# Patient Record
Sex: Female | Born: 1970 | Race: Asian | Hispanic: No | Marital: Single | Smoking: Never smoker
Health system: Southern US, Community
[De-identification: ages and names within clinical notes are randomized; demographics above are authoritative.]

## PROBLEM LIST (undated history)

## (undated) DIAGNOSIS — F419 Anxiety disorder, unspecified: Secondary | ICD-10-CM

## (undated) DIAGNOSIS — D649 Anemia, unspecified: Secondary | ICD-10-CM

## (undated) DIAGNOSIS — R112 Nausea with vomiting, unspecified: Secondary | ICD-10-CM

## (undated) DIAGNOSIS — R519 Headache, unspecified: Secondary | ICD-10-CM

## (undated) DIAGNOSIS — Z9889 Other specified postprocedural states: Secondary | ICD-10-CM

## (undated) DIAGNOSIS — Z8669 Personal history of other diseases of the nervous system and sense organs: Secondary | ICD-10-CM

## (undated) DIAGNOSIS — R51 Headache: Secondary | ICD-10-CM

## (undated) DIAGNOSIS — I10 Essential (primary) hypertension: Secondary | ICD-10-CM

## (undated) DIAGNOSIS — Z8742 Personal history of other diseases of the female genital tract: Secondary | ICD-10-CM

## (undated) DIAGNOSIS — Z90710 Acquired absence of both cervix and uterus: Secondary | ICD-10-CM

## (undated) DIAGNOSIS — K219 Gastro-esophageal reflux disease without esophagitis: Secondary | ICD-10-CM

## (undated) DIAGNOSIS — F32A Depression, unspecified: Secondary | ICD-10-CM

## (undated) DIAGNOSIS — K635 Polyp of colon: Secondary | ICD-10-CM

## (undated) DIAGNOSIS — F329 Major depressive disorder, single episode, unspecified: Secondary | ICD-10-CM

## (undated) HISTORY — PX: KNEE ARTHROPLASTY: SHX992

## (undated) HISTORY — DX: Polyp of colon: K63.5

## (undated) HISTORY — DX: Acquired absence of both cervix and uterus: Z90.710

## (undated) HISTORY — DX: Anxiety disorder, unspecified: F41.9

## (undated) HISTORY — DX: Personal history of other diseases of the nervous system and sense organs: Z86.69

## (undated) HISTORY — DX: Gastro-esophageal reflux disease without esophagitis: K21.9

## (undated) HISTORY — PX: UPPER GI ENDOSCOPY: SHX6162

## (undated) HISTORY — PX: KNEE ARTHROSCOPY: SUR90

## (undated) HISTORY — PX: ABDOMINAL HYSTERECTOMY: SHX81

## (undated) HISTORY — DX: Essential (primary) hypertension: I10

## (undated) HISTORY — DX: Anemia, unspecified: D64.9

## (undated) HISTORY — PX: COLONOSCOPY: SHX174

## (undated) HISTORY — DX: Personal history of other diseases of the female genital tract: Z87.42

---

## 1993-04-18 HISTORY — PX: WISDOM TOOTH EXTRACTION: SHX21

## 2008-07-05 ENCOUNTER — Emergency Department: Payer: Self-pay | Admitting: Unknown Physician Specialty

## 2009-04-24 ENCOUNTER — Emergency Department (HOSPITAL_COMMUNITY): Admission: EM | Admit: 2009-04-24 | Discharge: 2009-04-24 | Payer: Self-pay | Admitting: Family Medicine

## 2009-06-05 ENCOUNTER — Emergency Department: Payer: Self-pay | Admitting: Unknown Physician Specialty

## 2009-08-16 HISTORY — PX: LAPAROSCOPIC SUPRACERVICAL HYSTERECTOMY: SUR797

## 2009-08-16 HISTORY — PX: LAPAROSCOPIC VAGINAL HYSTERECTOMY: SUR798

## 2009-09-14 ENCOUNTER — Ambulatory Visit: Payer: Self-pay | Admitting: Obstetrics and Gynecology

## 2009-09-15 ENCOUNTER — Ambulatory Visit: Payer: Self-pay | Admitting: Obstetrics and Gynecology

## 2010-02-15 ENCOUNTER — Emergency Department (HOSPITAL_COMMUNITY): Admission: EM | Admit: 2010-02-15 | Discharge: 2010-02-15 | Payer: Self-pay | Admitting: Emergency Medicine

## 2010-06-30 LAB — URINALYSIS, ROUTINE W REFLEX MICROSCOPIC
Glucose, UA: NEGATIVE mg/dL
Ketones, ur: NEGATIVE mg/dL
Nitrite: NEGATIVE
Protein, ur: NEGATIVE mg/dL
Urobilinogen, UA: 0.2 mg/dL (ref 0.0–1.0)

## 2010-06-30 LAB — DIFFERENTIAL
Basophils Absolute: 0 10*3/uL (ref 0.0–0.1)
Basophils Relative: 1 % (ref 0–1)
Eosinophils Absolute: 0.1 10*3/uL (ref 0.0–0.7)
Monocytes Absolute: 0.3 10*3/uL (ref 0.1–1.0)
Neutro Abs: 3.2 10*3/uL (ref 1.7–7.7)

## 2010-06-30 LAB — BASIC METABOLIC PANEL
BUN: 8 mg/dL (ref 6–23)
CO2: 29 mEq/L (ref 19–32)
Calcium: 9.3 mg/dL (ref 8.4–10.5)
GFR calc non Af Amer: >60 mL/min (ref >60)
Glucose, Bld: 88 mg/dL (ref 70–99)
Potassium: 3.8 mEq/L (ref 3.5–5.1)

## 2010-06-30 LAB — CBC
HCT: 38.8 % (ref 36.0–46.0)
MCH: 30.7 pg (ref 26.0–34.0)
MCHC: 34.2 g/dL (ref 30.0–36.0)
RDW: 13.9 % (ref 11.5–15.5)

## 2010-06-30 LAB — HEPATIC FUNCTION PANEL
AST: 18 U/L (ref 0–37)
Bilirubin, Direct: 0.1 mg/dL (ref 0.0–0.3)
Indirect Bilirubin: 0.5 mg/dL (ref 0.3–0.9)

## 2010-06-30 LAB — GC/CHLAMYDIA PROBE AMP, GENITAL: GC Probe Amp, Genital: NEGATIVE

## 2010-06-30 LAB — PREGNANCY, URINE: Preg Test, Ur: NEGATIVE

## 2010-08-17 HISTORY — PX: OTHER SURGICAL HISTORY: SHX169

## 2010-09-09 ENCOUNTER — Ambulatory Visit: Payer: Self-pay | Admitting: Unknown Physician Specialty

## 2011-09-08 ENCOUNTER — Encounter: Payer: Self-pay | Admitting: Internal Medicine

## 2011-09-08 ENCOUNTER — Ambulatory Visit (INDEPENDENT_AMBULATORY_CARE_PROVIDER_SITE_OTHER): Payer: 59 | Admitting: Internal Medicine

## 2011-09-08 VITALS — BP 110/72 | HR 75 | Temp 98.1°F | Ht 64.4 in | Wt 189.0 lb

## 2011-09-08 DIAGNOSIS — N939 Abnormal uterine and vaginal bleeding, unspecified: Secondary | ICD-10-CM

## 2011-09-08 DIAGNOSIS — F419 Anxiety disorder, unspecified: Secondary | ICD-10-CM | POA: Insufficient documentation

## 2011-09-08 DIAGNOSIS — K219 Gastro-esophageal reflux disease without esophagitis: Secondary | ICD-10-CM

## 2011-09-08 DIAGNOSIS — D649 Anemia, unspecified: Secondary | ICD-10-CM | POA: Insufficient documentation

## 2011-09-08 DIAGNOSIS — Z90710 Acquired absence of both cervix and uterus: Secondary | ICD-10-CM | POA: Insufficient documentation

## 2011-09-08 DIAGNOSIS — G43909 Migraine, unspecified, not intractable, without status migrainosus: Secondary | ICD-10-CM

## 2011-09-08 DIAGNOSIS — N898 Other specified noninflammatory disorders of vagina: Secondary | ICD-10-CM

## 2011-09-08 DIAGNOSIS — F411 Generalized anxiety disorder: Secondary | ICD-10-CM

## 2011-09-08 NOTE — Progress Notes (Signed)
Subjective:    [name], female    DOB: 04/09/1971, 41 y.o.   MRN: [id number]  HPI New pt. Here to establish primary care.   PMH of GERD controlled with omeprazole, mild anxiety, anemia,  And migraine headache.  She had a supracervical hysterectomy in 08/2009 by Dr. Nedra Hai at [facility].    Maudene is concerned as she still is bleeding each month when her menses would be due.  "ONce  A month I bleed like a period. "  She also has pelvic pain around her ovulation time.  She reports multiple ultrasounds at [facility] but she does not desire to go back there and would like a new GYN MD  She also has chronic vaginal itching despite multiple vaginal and oral meds it is not helping.    Allergies  Allergen Reactions  . Hydrocodone Anxiety and Palpitations   Past Medical History  Diagnosis Date  . Anemia   . Anxiety   . GERD (gastroesophageal reflux disease)   . History of hysterectomy     still has both ovaries  . History of migraine headaches    Past Surgical History  Procedure Date  . Knee athroscopy 5/12    LT  . Laparoscopic vaginal hysterectomy 5/11    Supra cervical  . Wisdom tooth extraction 1995   History   Social History  . Marital Status: Divorced    Spouse Name: N/A    Number of Children: N/A  . Years of Education: N/A   Occupational History  . Not on file.   Social History Main Topics  . Smoking status: Never Smoker   . Smokeless tobacco: Never Used  . Alcohol Use: Yes     occassional  . Drug Use: No  . Sexually Active: No   Other Topics Concern  . Not on file   Social History Narrative  . No narrative on file   Family History  Problem Relation Age of Onset  . Diabetes Father   . Stroke Father   . Heart attack Father   . Hyperlipidemia Mother   . Hypertension Mother    Patient Active Problem List  Diagnoses  . Anemia  . GERD (gastroesophageal reflux disease)  . Anxiety  . Migraine headache  . H/O: hysterectomy   Current  Outpatient Prescriptions on File Prior to Visit  Medication Sig Dispense Refill  . [facility] OME[facility] PO            Review of Systems See HPI    Objective:   Physical Exam Physical Exam  Nursing note and vitals reviewed.  Constitutional: She is oriented to person, place, and time. She appears well-developed and well-nourished.  HENT:  Head: Normocephalic and atraumatic.  Cardiovascular: Normal rate and regular rhythm. Exam reveals no gallop and no friction rub.  No murmur heard.  Pulmonary/Chest: Breath sounds normal. She has no wheezes. She has no rales.  Neurological: She is alert and oriented to person, place, and time.  Skin: Skin is warm and dry.  Psychiatric: She has a normal mood and affect. Her behavior is normal.  I did not do pelvic today       Assessment & Plan:  1)  MOnthly vaginal bleeding post supracervical hysterectomy>  Unclear of etitology ?? Endometriosis Wil refer to GYN and need old records from Dr. Nedra Hai as she reports multiple ultrasounds done 2)  Chronic vagintits  Suspect noninfectious  Will need old records and will refer to GYN 3)  Anemia  Check today 4)  History of migraine headache 5)  GERD  Controlled with omperazole

## 2011-09-08 NOTE — Patient Instructions (Signed)
Will refer to gyn   Labs will be mailed to you

## 2011-09-14 ENCOUNTER — Telehealth: Payer: Self-pay | Admitting: *Deleted

## 2011-09-14 DIAGNOSIS — G43909 Migraine, unspecified, not intractable, without status migrainosus: Secondary | ICD-10-CM

## 2011-09-14 MED ORDER — SUMATRIPTAN SUCCINATE 100 MG PO TABS: ORAL_TABLET | ORAL | Status: DC

## 2011-09-14 NOTE — Telephone Encounter (Signed)
Pt called stating that she was seen in the office last week and was to get a RX for Imitrex called into [facility].  Spoke with Dr Constance Goltz via phone and she ordered Imitrex 100mg  #10 Take one onset of headache and may repeat in 2 hours.  NR given

## 2011-09-27 ENCOUNTER — Telehealth: Payer: Self-pay | Admitting: *Deleted

## 2011-09-27 NOTE — Telephone Encounter (Signed)
Copy pf labs mailed to pt's home address.

## 2011-10-03 ENCOUNTER — Encounter: Payer: Self-pay | Admitting: Internal Medicine

## 2011-10-05 ENCOUNTER — Ambulatory Visit: Payer: Self-pay | Admitting: Internal Medicine

## 2011-10-23 ENCOUNTER — Encounter: Payer: Self-pay | Admitting: Internal Medicine

## 2011-10-23 DIAGNOSIS — Z90711 Acquired absence of uterus with remaining cervical stump: Secondary | ICD-10-CM | POA: Insufficient documentation

## 2011-11-22 ENCOUNTER — Telehealth: Payer: Self-pay | Admitting: *Deleted

## 2011-11-22 NOTE — Telephone Encounter (Signed)
Patricia Bauer HR specialist @ [employer] sent over a form to be filled out stating any disabilities the pt has for need a special keyboard.  The only thing in her chart is that she does suffer from migraines.  There is no physcial reason noted why she should need this keyboard.

## 2011-11-30 ENCOUNTER — Ambulatory Visit: Admit: 2011-11-30 | Payer: Self-pay | Admitting: Orthopedic Surgery

## 2011-11-30 SURGERY — ARTHROSCOPY, KNEE
Anesthesia: Choice | Laterality: Left

## 2011-12-06 ENCOUNTER — Encounter: Payer: 59 | Admitting: Internal Medicine

## 2011-12-13 ENCOUNTER — Telehealth: Payer: Self-pay | Admitting: *Deleted

## 2011-12-28 ENCOUNTER — Encounter: Payer: Self-pay | Admitting: Internal Medicine

## 2011-12-28 ENCOUNTER — Ambulatory Visit (INDEPENDENT_AMBULATORY_CARE_PROVIDER_SITE_OTHER): Payer: 59 | Admitting: Internal Medicine

## 2011-12-28 ENCOUNTER — Telehealth: Payer: Self-pay | Admitting: *Deleted

## 2011-12-28 VITALS — BP 126/80 | HR 79 | Temp 97.6°F | Resp 16 | Wt 188.0 lb

## 2011-12-28 DIAGNOSIS — R110 Nausea: Secondary | ICD-10-CM

## 2011-12-28 DIAGNOSIS — R42 Dizziness and giddiness: Secondary | ICD-10-CM

## 2011-12-28 MED ORDER — DIAZEPAM 5 MG PO TABS: ORAL_TABLET | ORAL | Status: DC

## 2011-12-28 MED ORDER — PROMETHAZINE HCL 12.5 MG PO TABS: 12.5000 mg | ORAL_TABLET | Freq: Three times a day (TID) | ORAL | Status: DC | PRN

## 2011-12-28 NOTE — Patient Instructions (Addendum)
Take medicine as prescribed   See me Monday

## 2011-12-29 ENCOUNTER — Encounter: Payer: Self-pay | Admitting: Internal Medicine

## 2011-12-29 ENCOUNTER — Ambulatory Visit: Payer: 59 | Admitting: Internal Medicine

## 2011-12-29 ENCOUNTER — Telehealth: Payer: Self-pay | Admitting: Internal Medicine

## 2011-12-29 DIAGNOSIS — R42 Dizziness and giddiness: Secondary | ICD-10-CM | POA: Insufficient documentation

## 2011-12-29 NOTE — Progress Notes (Signed)
Subjective:    [name], female    DOB: [birth year], 41 y.o.   MRN: [id number]  HPI [name] is here for acute visit.  She describes 2 days ago on Monday she had acute episode of "room spinning" associated with nausea but no vomiting.  She denies recent URI or allergy symptoms.  She felt some improvement over the next 2 days but still feels very dizzy when turning head or changing head position.  No facial or body numbness or weakness, no dysarthria,  No muscle weakness, no visual change.  No prior episode of vertigo.  She is recovering from recent knee meniscus repair from Dr. Despina Hick.    Allergies  Allergen Reactions  . Hydrocodone Anxiety and Palpitations   Past Medical History  Diagnosis Date  . Anemia   . Anxiety   . GERD (gastroesophageal reflux disease)   . History of hysterectomy     still has both ovaries  . History of migraine headaches    Past Surgical History  Procedure Date  . Knee athroscopy 5/12    LT  . Laparoscopic vaginal hysterectomy 5/11    Supra cervical  . Wisdom tooth extraction 1995  . Knee arthroscopy    History   Social History  . Marital Status: Divorced    Spouse Name: N/A    Number of Children: N/A  . Years of Education: N/A   Occupational History  . Not on file.   Social History Main Topics  . Smoking status: Never Smoker   . Smokeless tobacco: Never Used  . Alcohol Use: Yes     occassional  . Drug Use: No  . Sexually Active: No   Other Topics Concern  . Not on file   Social History Narrative  . No narrative on file   Family History  Problem Relation Age of Onset  . Diabetes Father   . Stroke Father   . Heart attack Father   . Hyperlipidemia Mother   . Hypertension Mother    Patient Active Problem List  Diagnosis  . Anemia  . GERD (gastroesophageal reflux disease)  . Anxiety  . Migraine headache  . H/O: hysterectomy  . Vaginal bleeding problems  . History of hysterectomy, supracervical  . Vertigo    Current Outpatient Prescriptions on File Prior to Visit  Medication Sig Dispense Refill  . [facility] PO       . SUMAtriptan (IMITREX) 100 MG tablet Take one PO @ onset of headache, may repeat in 2 hrs if needed  10 tablet  0  . AMBULATORY NON FORMULARY MEDICATION L-Carnitine  2 tabs up to three times daily      . b complex vitamins capsule Take 1 capsule by mouth daily.      Marland Kitchen glucosamine-chondroitin 500-400 MG tablet Take 1 tablet by mouth daily.      . Multiple Minerals-Vitamins (CALCIUM CITRATE PLUS PO) Take 3 tablets by mouth daily.      . promethazine (PHENERGAN) 12.5 MG tablet Take 1 tablet (12.5 mg total) by mouth every 8 (eight) hours as needed for nausea.  6 tablet  0       Review of Systems    see HPI Objective:   Physical Exam Physical Exam  Nursing note and vitals reviewed.  Constitutional: She is oriented to person, place, and time. She appears well-developed and well-nourished.  HENT:  Head: Normocephalic and atraumatic.  Cardiovascular: Normal rate and regular rhythm. Exam reveals no gallop and no friction rub.  No murmur heard.  Pulmonary/Chest: Breath sounds normal. She has no wheezes. She has no rales.  Neurological: She is alert and oriented to person, place, and time.  Skin: Skin is warm and dry.  Psychiatric: She has a normal mood and affect. Her behavior is normal.  Neurologic:  CNII-XII  nystagmus to extreme lateral gaze  Motor 5/5 all muscle groups tested in UE and LE  Reflexes 2+ symmetric  Cerebellar  Intact FTN  Sensory  Intact to pinprick              Assessment & Plan:  Dizziness  clinicallly consistiant with BPV.  Will treat with Valium low dose and Phenergan prn.  Advised not to drive for now and to stay off work tomorrow.  Given educational handout regarding vertigo and advised if any worsening or speech, muscle weaness or numbness to go to ER for CT scan.  She voices understanding. Will recheck in 5 days or sooner prn.  Also  given instructions on Epley maneuver.

## 2011-12-29 NOTE — Telephone Encounter (Signed)
Patricia Bauer  Call pt after lunch time today and check on her to see how she is feeling.  Advise her if any worsening symptoms, numbness, muscle weakness over week-end to go to urgent care or ER.    She has follow up appt with me on Monday.    Route back to me with pt response

## 2011-12-29 NOTE — Telephone Encounter (Signed)
Called to follow up with pt. Pt states that medication prescribed has not worked. Advised pt to seek attention over weekend if sx worsened. Pt was at work this PM

## 2012-01-02 ENCOUNTER — Ambulatory Visit: Payer: 59 | Admitting: Internal Medicine

## 2012-01-02 NOTE — Telephone Encounter (Signed)
Pt rescheduled as a work in

## 2012-01-04 NOTE — Telephone Encounter (Signed)
Returned call regarding results

## 2012-03-01 ENCOUNTER — Telehealth: Payer: Self-pay | Admitting: *Deleted

## 2012-03-01 NOTE — Telephone Encounter (Signed)
FMLA papers faxed to [employer] [facility] [employer]

## 2012-03-06 ENCOUNTER — Telehealth: Payer: Self-pay | Admitting: Internal Medicine

## 2012-03-06 NOTE — Telephone Encounter (Signed)
Pt states she needs you to call her.. The papers that were faxed last week for her FMLA; there are some things that need to be clarified... Please call her [phone].Marland Kitchen

## 2012-03-07 ENCOUNTER — Other Ambulatory Visit: Payer: Self-pay | Admitting: Internal Medicine

## 2012-03-08 ENCOUNTER — Other Ambulatory Visit: Payer: Self-pay | Admitting: Internal Medicine

## 2012-03-08 ENCOUNTER — Other Ambulatory Visit: Payer: Self-pay | Admitting: *Deleted

## 2012-03-08 ENCOUNTER — Ambulatory Visit (INDEPENDENT_AMBULATORY_CARE_PROVIDER_SITE_OTHER): Payer: 59 | Admitting: Internal Medicine

## 2012-03-08 ENCOUNTER — Encounter: Payer: Self-pay | Admitting: Internal Medicine

## 2012-03-08 VITALS — BP 122/80 | HR 72 | Temp 98.7°F | Resp 16 | Ht 65.0 in | Wt 186.1 lb

## 2012-03-08 DIAGNOSIS — F411 Generalized anxiety disorder: Secondary | ICD-10-CM

## 2012-03-08 DIAGNOSIS — Z87898 Personal history of other specified conditions: Secondary | ICD-10-CM | POA: Insufficient documentation

## 2012-03-08 DIAGNOSIS — N6310 Unspecified lump in the right breast, unspecified quadrant: Secondary | ICD-10-CM | POA: Insufficient documentation

## 2012-03-08 DIAGNOSIS — K219 Gastro-esophageal reflux disease without esophagitis: Secondary | ICD-10-CM

## 2012-03-08 DIAGNOSIS — F419 Anxiety disorder, unspecified: Secondary | ICD-10-CM

## 2012-03-08 DIAGNOSIS — N63 Unspecified lump in unspecified breast: Secondary | ICD-10-CM

## 2012-03-08 DIAGNOSIS — Z0000 Encounter for general adult medical examination without abnormal findings: Secondary | ICD-10-CM

## 2012-03-08 DIAGNOSIS — D649 Anemia, unspecified: Secondary | ICD-10-CM

## 2012-03-08 DIAGNOSIS — G43909 Migraine, unspecified, not intractable, without status migrainosus: Secondary | ICD-10-CM

## 2012-03-08 DIAGNOSIS — Z1231 Encounter for screening mammogram for malignant neoplasm of breast: Secondary | ICD-10-CM

## 2012-03-08 LAB — POCT URINALYSIS DIPSTICK
Glucose, UA: NEGATIVE
Ketones, UA: NEGATIVE
Leukocytes, UA: NEGATIVE
Spec Grav, UA: <=1.005

## 2012-03-08 NOTE — Patient Instructions (Addendum)
To have diagnostic mammogram

## 2012-03-08 NOTE — Telephone Encounter (Signed)
Needs refill

## 2012-03-08 NOTE — Telephone Encounter (Signed)
Patricia Bauer  What does she need refill of??

## 2012-03-08 NOTE — Progress Notes (Signed)
Subjective:    [name], female    DOB: 1970/06/20, 41 y.o.   MRN: [id number]  HPI Patricia Bauer is here for comprehensive eval  She did see a GYN in [place] for vaginal spotting.  Pap was done at that time.  I do not  have results.   She describes episodes where she lies in bed at night watching TV, and "next thing I know I hear the alarm ringing"  She does not recall getting up to turn off TV or lights but it will be dark when I wake up.  No palpitations, no dizziness.  Episodes do not occur in daytime. No incontinence.   Has lower crampy abd pain with gas when she eats dairy.  She has been told she is borderline gluten intolerant in the past.  She states she felt a whole lot better with a gluten free diet but she went back to eating gluted because "it was too expensive and I craved bread"   Migraines getting worse  3-5 episodes a week.  Had been on beta blocker in the past.   She does give a history  Of aspiration of Breast cyst in the past.     She does give history of having a colon polyp.  Was scoped somewhere in [place] but does not remember who did it.  She had upper and lower endoscopy work up for anemia and was told she had a colon polyp  She is gaining weight and cannot exercise .   Allergies  Allergen Reactions  . Hydrocodone Anxiety and Palpitations   Past Medical History  Diagnosis Date  . Anemia   . Anxiety   . GERD (gastroesophageal reflux disease)   . History of hysterectomy     still has both ovaries  . History of migraine headaches    Past Surgical History  Procedure Date  . Knee athroscopy 5/12    LT  . Laparoscopic vaginal hysterectomy 5/11    Supra cervical  . Wisdom tooth extraction 1995  . Knee arthroscopy    History   Social History  . Marital Status: Divorced    Spouse Name: N/A    Number of Children: N/A  . Years of Education: N/A   Occupational History  . Not on file.   Social History Main Topics  . Smoking status: Never  Smoker   . Smokeless tobacco: Never Used  . Alcohol Use: Yes     Comment: occassional  . Drug Use: No  . Sexually Active: No   Other Topics Concern  . Not on file   Social History Narrative  . No narrative on file   Family History  Problem Relation Age of Onset  . Diabetes Father   . Stroke Father   . Heart attack Father   . Hyperlipidemia Mother   . Hypertension Mother    Patient Active Problem List  Diagnosis  . Anemia  . GERD (gastroesophageal reflux disease)  . Anxiety  . Migraine headache  . H/O: hysterectomy  . Vaginal bleeding problems  . History of hysterectomy, supracervical  . Vertigo  . History of headache   Current Outpatient Prescriptions on File Prior to Visit  Medication Sig Dispense Refill  . [facility] OMEPR[facility]LE PO       . SUMAtriptan (IMITREX) 100 MG tablet Take one PO @ onset of headache, may repeat in 2 hrs if needed  10 tablet  0  . AMBULATORY NON FORMULARY MEDICATION L-Carnitine  2 tabs up to  three times daily      . b complex vitamins capsule Take 1 capsule by mouth daily.      . diazepam (VALIUM) 5 MG tablet Take 1/2 tablet every 12 hours as needed for vertigo. May repeat in 1 hour if needed  12 tablet  0  . glucosamine-chondroitin 500-400 MG tablet Take 1 tablet by mouth daily.      . Multiple Minerals-Vitamins (CALCIUM CITRATE PLUS PO) Take 3 tablets by mouth daily.      . promethazine (PHENERGAN) 12.5 MG tablet Take 1 tablet (12.5 mg total) by mouth every 8 (eight) hours as needed for nausea.  6 tablet  0      Review of Systems     Objective:   Physical Exam Physical Exam  Nursing note and vitals reviewed.  Constitutional: She is oriented to person, place, and time. She appears well-developed and well-nourished.  HENT:  Head: Normocephalic and atraumatic.  Right Ear: Tympanic membrane and ear canal normal. No drainage. Tympanic membrane is not injected and not erythematous.  Left Ear: Tympanic membrane and ear canal normal. No drainage.  Tympanic membrane is not injected and not erythematous.  Nose: Nose normal. Right sinus exhibits no maxillary sinus tenderness and no frontal sinus tenderness. Left sinus exhibits no maxillary sinus tenderness and no frontal sinus tenderness.  Mouth/Throat: Oropharynx is clear and moist. No oral lesions. No oropharyngeal exudate.  Eyes: Conjunctivae and EOM are normal. Pupils are equal, round, and reactive to light.  Neck: Normal range of motion. Neck supple. No JVD present. Carotid bruit is not present. No mass and no thyromegaly present.  Cardiovascular: Normal rate, regular rhythm, S1 normal, S2 normal and intact distal pulses. Exam reveals no gallop and no friction rub.  No murmur heard.  Pulses:  Carotid pulses are 2+ on the right side, and 2+ on the left side.  Dorsalis pedis pulses are 2+ on the right side, and 2+ on the left side.  No carotid bruit. No LE edema  Pulmonary/Chest: Breath sounds normal. She has no wheezes. She has no rales. She exhibits no tenderness.  Breast  She has two masses near areola R  breast.  No nipple discharge no axillary adenopathy.  R breast   No nipple discharge no discrete masses no axillary adenopathy Abdominal: Soft. Bowel sounds are normal. She exhibits no distension and no mass. There is no hepatosplenomegaly. There is no tenderness. There is no CVA tenderness.  Musculoskeletal: Normal range of motion.  No active synovitis to joints.  Lymphadenopathy:  She has no cervical adenopathy.  She has no axillary adenopathy.  Right: No inguinal and no supraclavicular adenopathy present.  Left: No inguinal and no supraclavicular adenopathy present.  Neurological: She is alert and oriented to person, place, and time. She has normal strength and normal reflexes. She displays no tremor. No cranial nerve deficit or sensory deficit. Coordination and gait normal.  Skin: Skin is warm and dry. No rash noted. No cyanosis. Nails show no clubbing.  Psychiatric: She has a  normal mood and affect. Her speech is normal and behavior is normal. Cognition and memory are normal.           Assessment & Plan:  Health Maintenance  Flu vaccine last month.  Declines tdap today    R breast mass x 2  Will get both bilateral screening and diagnostic mm of R breast.  Pt does give history of aspiration of breast cysts in the past  Anemia  Improved  Migraine  More frequent now.  Will refer to headache center  History of vagina spotting  Was evaluated at  A GYN office in [place]  Mental status change:  Etiology unclear  Not sure if pt is actually losing consciousness  EKG ok today.  Will watch for now  Abd cramping will empirically stop all dairy for now.  She has been better on gluten freee diet in past.  See me in 6-8 weeks  After dairy free trial She has an appt on 05/01/2012

## 2012-03-09 ENCOUNTER — Other Ambulatory Visit: Payer: Self-pay | Admitting: Internal Medicine

## 2012-03-09 ENCOUNTER — Other Ambulatory Visit: Payer: Self-pay | Admitting: *Deleted

## 2012-03-09 DIAGNOSIS — N6310 Unspecified lump in the right breast, unspecified quadrant: Secondary | ICD-10-CM

## 2012-03-09 DIAGNOSIS — G43909 Migraine, unspecified, not intractable, without status migrainosus: Secondary | ICD-10-CM

## 2012-03-09 NOTE — Telephone Encounter (Signed)
imitrex

## 2012-03-10 MED ORDER — SUMATRIPTAN SUCCINATE 100 MG PO TABS: ORAL_TABLET | ORAL | Status: DC

## 2012-03-20 ENCOUNTER — Ambulatory Visit
Admission: RE | Admit: 2012-03-20 | Discharge: 2012-03-20 | Disposition: A | Discharge status: Home / Self Care | Payer: 59 | Source: Ambulatory Visit | Attending: Internal Medicine | Admitting: Internal Medicine

## 2012-03-20 DIAGNOSIS — N6310 Unspecified lump in the right breast, unspecified quadrant: Secondary | ICD-10-CM

## 2012-03-21 ENCOUNTER — Ambulatory Visit (HOSPITAL_BASED_OUTPATIENT_CLINIC_OR_DEPARTMENT_OTHER): Payer: 59

## 2012-03-26 ENCOUNTER — Telehealth: Payer: Self-pay | Admitting: Internal Medicine

## 2012-03-26 NOTE — Telephone Encounter (Signed)
Left message on pt mobile to return call regarding breast ultrasound results

## 2012-03-27 ENCOUNTER — Telehealth: Payer: Self-pay | Admitting: Internal Medicine

## 2012-03-27 DIAGNOSIS — N6310 Unspecified lump in the right breast, unspecified quadrant: Secondary | ICD-10-CM

## 2012-03-27 NOTE — Telephone Encounter (Signed)
I spoke with pt and informed of mammogram results.  I counseled pt that my recommendation is to have the breast cyst aspirated and fluid sent for pathology. I recommended referral to breast surgeon but pt declines repeatedly .

## 2012-03-28 ENCOUNTER — Ambulatory Visit: Payer: Self-pay | Admitting: Family Medicine

## 2012-05-01 ENCOUNTER — Ambulatory Visit: Payer: 59 | Admitting: Internal Medicine

## 2012-06-13 ENCOUNTER — Emergency Department: Payer: Self-pay | Admitting: Emergency Medicine

## 2012-07-05 ENCOUNTER — Other Ambulatory Visit: Payer: Self-pay | Admitting: Internal Medicine

## 2012-07-05 NOTE — Telephone Encounter (Signed)
Refill request will call in pending approval 

## 2012-07-05 NOTE — Telephone Encounter (Signed)
Valium called in to [facility]

## 2012-08-07 ENCOUNTER — Telehealth: Payer: Self-pay | Admitting: *Deleted

## 2012-08-07 ENCOUNTER — Telehealth: Payer: Self-pay | Admitting: Internal Medicine

## 2012-08-07 DIAGNOSIS — Z86010 Personal history of colonic polyps: Secondary | ICD-10-CM

## 2012-08-07 NOTE — Telephone Encounter (Signed)
Patricia Bauer  Ok to refer to Dr. Kinnie Scales

## 2012-08-07 NOTE — Telephone Encounter (Signed)
Information requested faxed to Dr [facility] office

## 2012-08-07 NOTE — Telephone Encounter (Signed)
Notified pt that I faxed info to Dr Kinnie Scales and that she will need to make her own appt Spoke with Mardella Layman at Dr [facility] office who stated that all she needs is demographics, insurance, and last OV notes

## 2012-08-08 ENCOUNTER — Ambulatory Visit (INDEPENDENT_AMBULATORY_CARE_PROVIDER_SITE_OTHER): Payer: 59 | Admitting: Internal Medicine

## 2012-08-08 ENCOUNTER — Telehealth: Payer: Self-pay | Admitting: *Deleted

## 2012-08-08 ENCOUNTER — Encounter: Payer: Self-pay | Admitting: Internal Medicine

## 2012-08-08 VITALS — BP 134/87 | HR 81 | Temp 97.3°F | Resp 20 | Wt 186.0 lb

## 2012-08-08 DIAGNOSIS — R110 Nausea: Secondary | ICD-10-CM

## 2012-08-08 DIAGNOSIS — F411 Generalized anxiety disorder: Secondary | ICD-10-CM

## 2012-08-08 DIAGNOSIS — F419 Anxiety disorder, unspecified: Secondary | ICD-10-CM

## 2012-08-08 MED ORDER — LORAZEPAM 0.5 MG PO TABS: ORAL_TABLET | ORAL | Status: DC

## 2012-08-08 NOTE — Progress Notes (Signed)
Subjective:    [name], female    DOB: 07/08/1970, 42 y.o.   MRN: [id number]  HPI Xolani is here for acute visit.  She states  "my anxiety is back with a vengeance".  She reports she has been told she has OCD, anxiety, and depression in the past.  OCD started after her first pregnancy when she was constantly vacuuming the floor.  She states she has never seen a psychiatrist for any of her mental issues.  She has been prescribed Zoloft and Wellbutrin in the past.  She could not tolerate either of these meds.  She does not recall ever taking any benzodazepines except when she was given valium for her vertigo  She describes work has been the biggest stressor for her.   She is working "10-12 hour days" She describes that she is having daily anxiety with heart palpitations  "feel like I'm having a heart attack" at times.   She works at [employer] in [employer].  Cannot sleep as she feels anxious  Of note she has been seeing Dr. Neale Burly for her headaches and she is on "maximum dose of Topamax"  But she cannot tell me the dose.  She states she takes 3 pills at night for the past 3-4 weeks.    I have received faxed labs from Dr. Nanine Means office  Dated 06/27/2012  Which reveals normal chemistries,  LDL 128 with total 199 and HGB 12.8 ESR of 6.    Andromeda also describes persistant nausea but no vomiting, diarrhea no fever no abd pain  Allergies  Allergen Reactions  . Hydrocodone Anxiety and Palpitations   Past Medical History  Diagnosis Date  . Anemia   . Anxiety   . GERD (gastroesophageal reflux disease)   . History of hysterectomy     still has both ovaries  . History of migraine headaches    Past Surgical History  Procedure Laterality Date  . Knee athroscopy  5/12    LT  . Laparoscopic vaginal hysterectomy  5/11    Supra cervical  . Wisdom tooth extraction  1995  . Knee arthroscopy     History   Social History  . Marital Status: Divorced    Spouse Name: N/A   Number of Children: N/A  . Years of Education: N/A   Occupational History  . Not on file.   Social History Main Topics  . Smoking status: Never Smoker   . Smokeless tobacco: Never Used  . Alcohol Use: Yes     Comment: occassional  . Drug Use: No  . Sexually Active: No   Other Topics Concern  . Not on file   Social History Narrative  . No narrative on file   Family History  Problem Relation Age of Onset  . Diabetes Father   . Stroke Father   . Heart attack Father   . Hyperlipidemia Mother   . Hypertension Mother    Patient Active Problem List  Diagnosis  . Anemia  . GERD (gastroesophageal reflux disease)  . Anxiety  . Migraine headache  . H/O: hysterectomy  . Vaginal bleeding problems  . History of hysterectomy, supracervical  . Vertigo  . History of headache  . Breast mass, right   Current Outpatient Prescriptions on File Prior to Visit  Medication Sig Dispense Refill  . [facility] O[facility] PO       . glucosamine-chondroitin 500-400 MG tablet Take 1 tablet by mouth daily.      . Multiple Minerals-Vitamins (CALCIUM  CITRATE PLUS PO) Take 3 tablets by mouth daily.      . promethazine (PHENERGAN) 12.5 MG tablet Take 1 tablet (12.5 mg total) by mouth every 8 (eight) hours as needed for nausea.  6 tablet  0   No current facility-administered medications on file prior to visit.      Review of Systems See HPI     Objective:   Physical Exam Physical Exam  Nursing note and vitals reviewed.  Constitutional: She is oriented to person, place, and time. She appears well-developed and well-nourished.  HENT:  Head: Normocephalic and atraumatic.  Cardiovascular: Normal rate and regular rhythm. Exam reveals no gallop and no friction rub.  No murmur heard.  Pulmonary/Chest: Breath sounds normal. She has no wheezes. She has no rales.  Neurological: She is alert and oriented to person, place, and time.  Skin: Skin is warm and dry.  Psychiatric: She has a normal mood and  affect. Her behavior is normal.         Assessment & Plan:  Anxiety:   She certainly has anxiety symptoms with insomnia.  She declines any  blood drawn today as she just had it done at Dr. Onnie Boer office.   Will likely need psychiatric consult for diagnostic evaluation.    I am willing to given short course of ativan and will re-evaluate pt in 2-3 weeks.    Nausea  Etiology  Unclear at this point.    ??? possibly due to high dose Topamax    She is to call office if any fever, abd pain vomiting or diarrhea developes.  She voices understanding

## 2012-08-08 NOTE — Patient Instructions (Addendum)
See me in 2-3 weeks  Call if any vomiting, fever, abd pain or diarrhea develpes

## 2012-08-21 ENCOUNTER — Encounter: Payer: Self-pay | Admitting: *Deleted

## 2012-08-29 NOTE — Telephone Encounter (Signed)
refill 

## 2012-09-05 ENCOUNTER — Encounter: Payer: Self-pay | Admitting: Internal Medicine

## 2012-09-05 ENCOUNTER — Ambulatory Visit (INDEPENDENT_AMBULATORY_CARE_PROVIDER_SITE_OTHER): Payer: 59 | Admitting: Internal Medicine

## 2012-09-05 VITALS — BP 130/88 | HR 71 | Temp 98.1°F | Resp 18 | Wt 187.0 lb

## 2012-09-05 DIAGNOSIS — R102 Pelvic and perineal pain: Secondary | ICD-10-CM

## 2012-09-05 DIAGNOSIS — F411 Generalized anxiety disorder: Secondary | ICD-10-CM

## 2012-09-05 DIAGNOSIS — F419 Anxiety disorder, unspecified: Secondary | ICD-10-CM

## 2012-09-05 DIAGNOSIS — N949 Unspecified condition associated with female genital organs and menstrual cycle: Secondary | ICD-10-CM

## 2012-09-05 DIAGNOSIS — R110 Nausea: Secondary | ICD-10-CM

## 2012-09-05 NOTE — Patient Instructions (Addendum)
Call my office if any pelvic pain recurs

## 2012-09-05 NOTE — Progress Notes (Signed)
Subjective:    [name], female    DOB: 1970-11-22, 42 y.o.   MRN: [id number]  HPI  Patricia Bauer is here for follow up.  She states she is better and has not needed Ativan since her Topamax dose was decreased to 75 mg.   Anxiety and nausea both resolved when reducing dose.     She states she saw her GYN in [place] last week for her pap smear.  I do not have report.  She reports she has a history of ovarian cyst and she had a pain in her lower left side yesterday.  No pain today.  No urinary frequency or urgency   Allergies  Allergen Reactions  . Hydrocodone Anxiety and Palpitations   Past Medical History  Diagnosis Date  . Anemia   . Anxiety   . GERD (gastroesophageal reflux disease)   . History of hysterectomy     still has both ovaries  . History of migraine headaches    Past Surgical History  Procedure Laterality Date  . Knee athroscopy  5/12    LT  . Laparoscopic vaginal hysterectomy  5/11    Supra cervical  . Wisdom tooth extraction  1995  . Knee arthroscopy     History   Social History  . Marital Status: Divorced    Spouse Name: N/A    Number of Children: N/A  . Years of Education: N/A   Occupational History  . Not on file.   Social History Main Topics  . Smoking status: Never Smoker   . Smokeless tobacco: Never Used  . Alcohol Use: Yes     Comment: occassional  . Drug Use: No  . Sexually Active: No   Other Topics Concern  . Not on file   Social History Narrative  . No narrative on file   Family History  Problem Relation Age of Onset  . Diabetes Father   . Stroke Father   . Heart attack Father   . Hyperlipidemia Mother   . Hypertension Mother    Patient Active Problem List   Diagnosis Date Noted  . History of headache 03/08/2012  . Breast mass, right 03/08/2012  . Vertigo 12/29/2011  . History of hysterectomy, supracervical 10/23/2011  . Anemia 09/08/2011  . GERD (gastroesophageal reflux disease) 09/08/2011  . Anxiety  09/08/2011  . Migraine headache 09/08/2011  . H/O: hysterectomy 09/08/2011  . Vaginal bleeding problems 09/08/2011   Current Outpatient Prescriptions on File Prior to Visit  Medication Sig Dispense Refill  . [facility] OMEPRAZOLE PO       . Multiple Minerals-Vitamins (CALCIUM CITRATE PLUS PO) Take 3 tablets by mouth daily.      Mar[facility] Kitchen LORazepam (ATIVAN) 0.5 MG tablet Take one tablet hs prn .  May repeat 1/2 tablet during the day for anxiety  30 tablet  0  . topiramate (TOPAMAX) 100 MG tablet Take 75 mg by mouth daily.        No current facility-administered medications on file prior to visit.      Review of Systems See HPI    Objective:   Physical Exam  Physical Exam  Nursing note and vitals reviewed.  Constitutional: She is oriented to person, place, and time. She appears well-developed and well-nourished.  HENT:  Head: Normocephalic and atraumatic.  Cardiovascular: Normal rate and regular rhythm. Exam reveals no gallop and no friction rub.  No murmur heard.  Pulmonary/Chest: Breath sounds normal. She has no wheezes. She has no rales.  Neurological: She  is alert and oriented to person, place, and time.  Skin: Skin is warm and dry.  Psychiatric: She has a normal mood and affect. Her behavior is normal.             Assessment & Plan:  Anxiety  Improved  On lower dose Topamax  Nausea  Improved   Left sided pelvic pain:  ?? Eitology.  She has an upcoming appt  For her colonoscopy in a few weeks  Advised to call my  office if pain recurs or she may see her GYN to have ultrasound

## 2012-09-12 ENCOUNTER — Telehealth: Payer: Self-pay | Admitting: *Deleted

## 2012-09-12 NOTE — Telephone Encounter (Signed)
Patricia Bauer called today about FMLA paperwork.  She states that the dates on the paperwork were incorrect.  She also states that Dr Neale Burly said he could not fill them out because she had not seen him enough.  She would like a return call on this issue.

## 2012-09-14 NOTE — Telephone Encounter (Signed)
Explained to pt that Dr Constance Goltz was not treating her migraines and therefore would not fill out paperwork explained that if we filled out paperwork it would reflect that she did not need FMLA since we have no record of treating her migraines. suggested that pt make an appt to see Dr Neale Burly to discuss this. Pt states that she has to see him 2-3 more times for migraines before they will fill out paperwork.

## 2012-10-16 ENCOUNTER — Encounter: Payer: Self-pay | Admitting: Internal Medicine

## 2012-10-16 DIAGNOSIS — Z860100 Personal history of colon polyps, unspecified: Secondary | ICD-10-CM | POA: Insufficient documentation

## 2012-10-18 ENCOUNTER — Encounter: Payer: Self-pay | Admitting: *Deleted

## 2013-03-13 ENCOUNTER — Emergency Department: Payer: Self-pay | Admitting: Emergency Medicine

## 2013-03-13 LAB — BASIC METABOLIC PANEL
Anion Gap: 7 (ref 7–16)
Creatinine: 0.8 mg/dL (ref 0.60–1.30)
EGFR (African American): >60
EGFR (Non-African Amer.): >60
Osmolality: 277 (ref 275–301)

## 2013-03-13 LAB — CBC: HCT: 36.3 % (ref 35.0–47.0)

## 2013-06-06 ENCOUNTER — Ambulatory Visit: Payer: Self-pay | Admitting: Family Medicine

## 2013-11-06 ENCOUNTER — Other Ambulatory Visit: Payer: Self-pay | Admitting: Orthopedic Surgery

## 2013-11-06 DIAGNOSIS — M25562 Pain in left knee: Secondary | ICD-10-CM

## 2013-11-25 ENCOUNTER — Other Ambulatory Visit: Payer: 59

## 2013-12-02 ENCOUNTER — Ambulatory Visit
Admission: RE | Admit: 2013-12-02 | Discharge: 2013-12-02 | Disposition: A | Discharge status: Home / Self Care | Payer: Worker's Compensation | Source: Ambulatory Visit | Attending: Orthopedic Surgery | Admitting: Orthopedic Surgery

## 2013-12-02 DIAGNOSIS — M25562 Pain in left knee: Secondary | ICD-10-CM

## 2014-01-14 ENCOUNTER — Other Ambulatory Visit: Payer: Self-pay | Admitting: Orthopedic Surgery

## 2014-01-14 NOTE — Progress Notes (Signed)
Preoperative surgical orders have been place into the Epic hospital system for Patricia Bauer on 01/14/2014, 12:04 PM  by Mickel Crow for surgery on 02/10/2014.  Preop Uni Knee orders including Experal, IV Tylenol, and IV Decadron as long as there are no contraindications to the above medications. [name], PA-C

## 2014-01-31 ENCOUNTER — Encounter (HOSPITAL_COMMUNITY): Payer: Self-pay | Admitting: Pharmacy Technician

## 2014-02-06 NOTE — Patient Instructions (Addendum)
Patricia Bauer  02/06/2014   Your procedure is scheduled on:    Come thru [facility] Entrance   Follow the Signs to [facility] at [place]       am  Call this number if you have problems the morning of surgery: [phone]   Remember:   Do not eat food or drink liquids after midnight.   Take these medicines the morning of surgery with A SIP OF WATER: Protonix    Do not wear jewelry, make-up or nail polish.  Do not wear lotions, powders, or perfumes. You may wear deodorant.  Do not shave 48 hours prior to surgery. Men may shave face and neck.  Do not bring valuables to the hospital.  Contacts, dentures or bridgework may not be worn into surgery.  Leave suitcase in the car. After surgery it may be brought to your room.  For patients admitted to the hospital, checkout time is 11:00 AM the day of  discharge.   Patients discharged the day of surgery will not be allowed to drive  home.  Name and phone number of your driver:   SEE CHG INSTRUCTION SHEET    Please read over the following fact sheets that you were given: MRSA Information, coughing and deep breathing exercises, leg exercises            [facility] - Preparing for Surgery Before surgery, you can play an important role.  Because skin is not sterile, your skin needs to be as free of germs as possible.  You can reduce the number of germs on your skin by washing with CHG (chlorahexidine gluconate) soap before surgery.  CHG is an antiseptic cleaner which kills germs and bonds with the skin to continue killing germs even after washing. Please DO NOT use if you have an allergy to CHG or antibacterial soaps.  If your skin becomes reddened/irritated stop using the CHG and inform your nurse when you arrive at Short Stay. Do not shave (including legs and underarms) for at least 48 hours prior to the first CHG shower.  You may shave your face/neck. Please follow these instructions carefully:  1.  Shower with CHG Soap the night before  surgery and the  morning of Surgery.  2.  If you choose to wash your hair, wash your hair first as usual with your  normal  shampoo.  3.  After you shampoo, rinse your hair and body thoroughly to remove the  shampoo.                           4.  Use CHG as you would any other liquid soap.  You can apply chg directly  to the skin and wash                       Gently with a scrungie or clean washcloth.  5.  Apply the CHG Soap to your body ONLY FROM THE NECK DOWN.   Do not use on face/ open                           Wound or open sores. Avoid contact with eyes, ears mouth and genitals (private parts).                       Wash face,  Genitals (private parts) with your normal soap.  6.  Wash thoroughly, paying special attention to the area where your surgery  will be performed.  7.  Thoroughly rinse your body with warm water from the neck down.  8.  DO NOT shower/wash with your normal soap after using and rinsing off  the CHG Soap.                9.  Pat yourself dry with a clean towel.            10.  Wear clean pajamas.            11.  Place clean sheets on your bed the night of your first shower and do not  sleep with pets. Day of Surgery : Do not apply any lotions/deodorants the morning of surgery.  Please wear clean clothes to the hospital/surgery center.  FAILURE TO FOLLOW THESE INSTRUCTIONS MAY RESULT IN THE CANCELLATION OF YOUR SURGERY PATIENT SIGNATURE_________________________________  NURSE SIGNATURE__________________________________  ________________________________________________________________________   Adam Phenix  An incentive spirometer is a tool that can help keep your lungs clear and active. This tool measures how well you are filling your lungs with each breath. Taking long deep breaths may help reverse or decrease the chance of developing breathing (pulmonary) problems (especially infection) following:  A long period of time when you are unable to  move or be active. BEFORE THE PROCEDURE   If the spirometer includes an indicator to show your best effort, your nurse or respiratory therapist will set it to a desired goal.  If possible, sit up straight or lean slightly forward. Try not to slouch.  Hold the incentive spirometer in an upright position. INSTRUCTIONS FOR USE  1. Sit on the edge of your bed if possible, or sit up as far as you can in bed or on a chair. 2. Hold the incentive spirometer in an upright position. 3. Breathe out normally. 4. Place the mouthpiece in your mouth and seal your lips tightly around it. 5. Breathe in slowly and as deeply as possible, raising the piston or the ball toward the top of the column. 6. Hold your breath for 3-5 seconds or for as long as possible. Allow the piston or ball to fall to the bottom of the column. 7. Remove the mouthpiece from your mouth and breathe out normally. 8. Rest for a few seconds and repeat Steps 1 through 7 at least 10 times every 1-2 hours when you are awake. Take your time and take a few normal breaths between deep breaths. 9. The spirometer may include an indicator to show your best effort. Use the indicator as a goal to work toward during each repetition. 10. After each set of 10 deep breaths, practice coughing to be sure your lungs are clear. If you have an incision (the cut made at the time of surgery), support your incision when coughing by placing a pillow or rolled up towels firmly against it. Once you are able to get out of bed, walk around indoors and cough well. You may stop using the incentive spirometer when instructed by your caregiver.  RISKS AND COMPLICATIONS  Take your time so you do not get dizzy or light-headed.  If you are in pain, you may need to take or ask for pain medication before doing incentive spirometry. It is harder to take a deep breath if you are having pain. AFTER USE  Rest and breathe slowly and easily.  It can be helpful to keep track of  a log of  your progress. Your caregiver can provide you with a simple table to help with this. If you are using the spirometer at home, follow these instructions: [place] IF:   You are having difficultly using the spirometer.  You have trouble using the spirometer as often as instructed.  Your pain medication is not giving enough relief while using the spirometer.  You develop fever of 100.5 F (38.1 C) or higher. SEEK IMMEDIATE MEDICAL CARE IF:   You cough up bloody sputum that had not been present before.  You develop fever of 102 F (38.9 C) or greater.  You develop worsening pain at or near the incision site. MAKE SURE YOU:   Understand these instructions.  Will watch your condition.  Will get help right away if you are not doing well or get worse. Document Released: 08/15/2006 Document Revised: 06/27/2011 Document Reviewed: 10/16/2006 [facility] Patient Information 2014 [place], [place].   ________________________________________________________________________

## 2014-02-07 ENCOUNTER — Encounter (HOSPITAL_COMMUNITY)
Admission: RE | Admit: 2014-02-07 | Discharge: 2014-02-07 | Disposition: A | Discharge status: Home / Self Care | Payer: Worker's Compensation | Source: Ambulatory Visit | Attending: Orthopedic Surgery | Admitting: Orthopedic Surgery

## 2014-02-07 ENCOUNTER — Encounter (HOSPITAL_COMMUNITY): Payer: Self-pay

## 2014-02-07 HISTORY — DX: Depression, unspecified: F32.A

## 2014-02-07 HISTORY — DX: Other specified postprocedural states: Z98.890

## 2014-02-07 HISTORY — DX: Major depressive disorder, single episode, unspecified: F32.9

## 2014-02-07 HISTORY — DX: Headache, unspecified: R51.9

## 2014-02-07 HISTORY — DX: Headache: R51

## 2014-02-07 HISTORY — DX: Nausea with vomiting, unspecified: R11.2

## 2014-02-07 LAB — COMPREHENSIVE METABOLIC PANEL
ALT: 18 U/L (ref 0–35)
AST: 15 U/L (ref 0–37)
Albumin: 3.8 g/dL (ref 3.5–5.2)
Alkaline Phosphatase: 80 U/L (ref 39–117)
Anion gap: 13 (ref 5–15)
BUN: 15 mg/dL (ref 6–23)
CO2: 25 mEq/L (ref 19–32)
Calcium: 9.3 mg/dL (ref 8.4–10.5)
Chloride: 98 mEq/L (ref 96–112)
Creatinine, Ser: 0.81 mg/dL (ref 0.50–1.10)
GFR calc non Af Amer: 88 mL/min — ABNORMAL LOW (ref >90)
Glucose, Bld: 92 mg/dL (ref 70–99)
POTASSIUM: 3.9 meq/L (ref 3.7–5.3)
SODIUM: 136 meq/L — AB (ref 137–147)
TOTAL PROTEIN: 7.8 g/dL (ref 6.0–8.3)
Total Bilirubin: <0.2 mg/dL — ABNORMAL LOW (ref 0.3–1.2)

## 2014-02-07 LAB — CBC
HCT: 38.2 % (ref 36.0–46.0)
Hemoglobin: 12.6 g/dL (ref 12.0–15.0)
MCH: 27.8 pg (ref 26.0–34.0)
MCHC: 33 g/dL (ref 30.0–36.0)
MCV: 84.3 fL (ref 78.0–100.0)
Platelets: 304 10*3/uL (ref 150–400)
RBC: 4.53 MIL/uL (ref 3.87–5.11)
RDW: 13.1 % (ref 11.5–15.5)
WBC: 8.8 10*3/uL (ref 4.0–10.5)

## 2014-02-07 LAB — SURGICAL PCR SCREEN
MRSA, PCR: NEGATIVE
STAPHYLOCOCCUS AUREUS: NEGATIVE

## 2014-02-07 LAB — URINALYSIS, ROUTINE W REFLEX MICROSCOPIC
Bilirubin Urine: NEGATIVE
GLUCOSE, UA: NEGATIVE mg/dL
Hgb urine dipstick: NEGATIVE
Ketones, ur: NEGATIVE mg/dL
Leukocytes, UA: NEGATIVE
NITRITE: NEGATIVE
PH: 6.5 (ref 5.0–8.0)
Protein, ur: NEGATIVE mg/dL
SPECIFIC GRAVITY, URINE: 1.011 (ref 1.005–1.030)
Urobilinogen, UA: 0.2 mg/dL (ref 0.0–1.0)

## 2014-02-07 LAB — NO BLOOD PRODUCTS

## 2014-02-07 LAB — PROTIME-INR
INR: 1.02 (ref 0.00–1.49)
Prothrombin Time: 13.5 seconds (ref 11.6–15.2)

## 2014-02-07 LAB — APTT: APTT: 31 s (ref 24–37)

## 2014-02-07 NOTE — Progress Notes (Signed)
Blood Refusal Consent form signed and on chart .  Faxed to [facility]. Confirmation of fax on chart.

## 2014-02-07 NOTE — Progress Notes (Signed)
EKG and CXR- 03/13/2013  On chart  [place] on chart  Dr Manuella Ghazi - clearance on chart dated- 11/19/2013

## 2014-02-07 NOTE — Progress Notes (Signed)
Temp- 99.3 at time of preop appointment on 02/07/2014.  FYI.

## 2014-02-09 ENCOUNTER — Other Ambulatory Visit: Payer: Self-pay | Admitting: Orthopedic Surgery

## 2014-02-09 NOTE — H&P (Signed)
Patricia Bauer DOB: [birth year] Divorced / Language: English / Race: Asian, White Female Date of Admission:  02-10-2014 Chief Complaint:  Left knee pain History of Present Illness  The patient is a 43 year old female who comes in for a preoperative History and Physical. The patient is scheduled for a left uni copartmental replacement to be performed by Dr. Dione Plover. Aluisio, MD at [facility] on 02/10/2014. The patient is a 43 year old female who presents for follow up of their knee. The patient is being followed for their left knee pain and osteoarthritis. Symptoms reported today include: pain (mostly medially) and difficulty ambulating. The patient feels that they are doing poorly and report their pain level to be moderate to severe. Current treatment includes: unloader brace. The following medication has been used for pain control: Oxycodone (on occasion) and Ultram. The patient has reported improvement of their symptoms with: bracing (the brace is helping a little). Braeden's knee continues to get worse. The pain is mainly along the medial aspect of the left knee. She is not getting swelling. It is not giving out. Work comp had sent her for a second opinion at a sports practice up in [place]. The opinion was concurrent with ours about her needing a unicompartmental replacement. She has now been approved and is ready to proceed with surgery at this time. They have been treated conservatively in the past for the above stated problem and despite conservative measures, they continue to have progressive pain and severe functional limitations and dysfunction. They have failed non-operative management including home exercise, medications, and injections. It is felt that they would benefit from undergoing unicompartmental joint replacement. Risks and benefits of the procedure have been discussed with the patient and they elect to proceed with surgery. There are no active contraindications to surgery  such as ongoing infection or rapidly progressive neurological disease.  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 migranes  Family History Hypertension First Degree Relatives. mother Cerebrovascular Accident father Diabetes Mellitus father  Social History Children 1 Alcohol use current drinker; drinks wine and hard liquor; only occasionally per week Exercise Exercises rarely; does running / walking Pain Contract no Number of flights of stairs before winded 2-3 Current work status working full time Drug/Alcohol Rehab (Previously) no Drug/Alcohol Rehab (Currently) no Tobacco use Never smoker. never smoker Marital status divorced Living situation live alone Illicit drug use no  Medication History Pantoprazole Sodium (Oral) Specific dose unknown - Active. TraMADol HCl (50MG  Tablet, Oral) Active.  Past Surgical History Arthroscopy of Knee left Hysterectomy Date: 2013. partial (non-cancerous)  Medical History Anemia Gastroesophageal Reflux Disease Migraine Headache   Review of Systems  General Not Present- Chills, Fatigue, Fever, Memory Loss, Night Sweats, Weight Gain and Weight Loss. Skin Not Present- Eczema, Hives, Itching, Lesions and Rash. HEENT Not Present- Dentures, Double Vision, Headache, Hearing Loss, Tinnitus and Visual Loss. Respiratory Present- Chronic Cough (likely from the reflux). Not Present- Allergies, Coughing up blood, Shortness of breath at rest and Shortness of breath with exertion. Cardiovascular Not Present- Chest Pain, Difficulty Breathing Lying Down, Murmur, Palpitations, Racing/skipping heartbeats and Swelling. Gastrointestinal Not Present- Abdominal Pain, Bloody Stool, Constipation, Diarrhea, Difficulty Swallowing, Heartburn, Jaundice, Loss of appetitie, Nausea and Vomiting. Female  Genitourinary Not Present- Blood in Urine, Discharge, Flank Pain, Incontinence, Painful Urination, Urgency, Urinary frequency, Urinary Retention, Urinating at Night and Weak urinary stream. Musculoskeletal Present- Joint Pain. Not  Present- Back Pain, Joint Swelling, Morning Stiffness, Muscle Pain, Muscle Weakness and Spasms. Neurological Not Present- Blackout spells, Difficulty with balance, Dizziness, Paralysis, Tremor and Weakness. Psychiatric Not Present- Insomnia.  Vitals  Weight: 193 lb Height: 65in Weight was reported by patient. Height was reported by patient. Body Surface Area: 2 m Body Mass Index: 32.12 kg/m  Pulse: 64 (Regular)  BP: 122/74 (Sitting, Right Arm, Standard)   Physical Exam  General Mental Status -Alert, cooperative and good historian. General Appearance-pleasant, Not in acute distress. Orientation-Oriented X3. Build & Nutrition-Well nourished and Well developed.  Head and Neck Head-normocephalic, atraumatic . Neck Global Assessment - supple, no bruit auscultated on the right, no bruit auscultated on the left.  Eye Vision-Wears contact lenses. Pupil - Bilateral-Regular and Round. Motion - Bilateral-EOMI.  Chest and Lung Exam Auscultation Breath sounds - clear at anterior chest wall and clear at posterior chest wall. Adventitious sounds - No Adventitious sounds.  Cardiovascular Auscultation Rhythm - Regular rate and rhythm. Heart Sounds - S1 WNL and S2 WNL. Murmurs & Other Heart Sounds - Auscultation of the heart reveals - No Murmurs.  Abdomen Palpation/Percussion Tenderness - Abdomen is non-tender to palpation. Rigidity (guarding) - Abdomen is soft. Auscultation Auscultation of the abdomen reveals - Bowel sounds normal.  Female Genitourinary Note: Not done, not pertinent to present illness   Musculoskeletal Note: General: She is in no distress.  Extremities: Her left knee shows no effusion. Her range is about 5 to  125. She does have some crepitus on range of motion; tenderness medial greater than lateral with no instability noted.   Assessment & Plan Primary osteoarthritis of one knee (M17.10) Note:Plan is for a Left UNI Knee Replacement by Dr. Wynelle Link.  Plan is to go home.  PCP - Dr. Manuella Ghazi  The patient does not have any contraindications and will receive TXA (tranexamic acid) prior to surgery.  Please note that the patient does get nauseated with anesthesia in the past.  Due to religious reasons, the patient wishes NOT to receive any blood products.  Signed electronically by Joelene Millin, III PA-C

## 2014-02-10 ENCOUNTER — Encounter (HOSPITAL_COMMUNITY): Payer: Self-pay | Admitting: *Deleted

## 2014-02-10 ENCOUNTER — Inpatient Hospital Stay (HOSPITAL_COMMUNITY)
Admission: RE | Admit: 2014-02-10 | Discharge: 2014-02-12 | DRG: 470 | Disposition: A | Discharge status: Home Health | Payer: Worker's Compensation | Source: Ambulatory Visit | Attending: Orthopedic Surgery | Admitting: Orthopedic Surgery

## 2014-02-10 ENCOUNTER — Encounter (HOSPITAL_COMMUNITY)
Admission: RE | Disposition: A | Discharge status: Home Health | Payer: Self-pay | Source: Ambulatory Visit | Attending: Orthopedic Surgery

## 2014-02-10 ENCOUNTER — Inpatient Hospital Stay (HOSPITAL_COMMUNITY): Payer: Worker's Compensation | Admitting: Anesthesiology

## 2014-02-10 ENCOUNTER — Encounter (HOSPITAL_COMMUNITY): Payer: Worker's Compensation | Admitting: Anesthesiology

## 2014-02-10 DIAGNOSIS — K219 Gastro-esophageal reflux disease without esophagitis: Secondary | ICD-10-CM | POA: Diagnosis present

## 2014-02-10 DIAGNOSIS — Z6832 Body mass index (BMI) 32.0-32.9, adult: Secondary | ICD-10-CM

## 2014-02-10 DIAGNOSIS — M1710 Unilateral primary osteoarthritis, unspecified knee: Secondary | ICD-10-CM | POA: Diagnosis present

## 2014-02-10 DIAGNOSIS — M1712 Unilateral primary osteoarthritis, left knee: Secondary | ICD-10-CM | POA: Diagnosis present

## 2014-02-10 DIAGNOSIS — M179 Osteoarthritis of knee, unspecified: Secondary | ICD-10-CM | POA: Diagnosis present

## 2014-02-10 DIAGNOSIS — M25562 Pain in left knee: Secondary | ICD-10-CM | POA: Diagnosis present

## 2014-02-10 HISTORY — PX: PARTIAL KNEE ARTHROPLASTY: SHX2174

## 2014-02-10 SURGERY — ARTHROPLASTY, KNEE, UNICOMPARTMENTAL
Anesthesia: General | Site: Knee | Laterality: Left

## 2014-02-10 MED ORDER — LACTATED RINGERS IV SOLN
INTRAVENOUS | Status: DC
  Administered 2014-02-10 (×3): via INTRAVENOUS

## 2014-02-10 MED ORDER — STERILE WATER FOR IRRIGATION IR SOLN
Status: DC | PRN
  Administered 2014-02-10: 1500 mL

## 2014-02-10 MED ORDER — ACETAMINOPHEN 650 MG RE SUPP: 650.0000 mg | Freq: Four times a day (QID) | RECTAL | Status: DC | PRN

## 2014-02-10 MED ORDER — SCOPOLAMINE 1 MG/3DAYS TD PT72
MEDICATED_PATCH | TRANSDERMAL | Status: AC
  Filled 2014-02-10: qty 1

## 2014-02-10 MED ORDER — METHOCARBAMOL 500 MG PO TABS
500.0000 mg | ORAL_TABLET | Freq: Four times a day (QID) | ORAL | Status: DC | PRN
  Administered 2014-02-10 – 2014-02-12 (×6): 500 mg via ORAL
  Filled 2014-02-10 (×6): qty 1

## 2014-02-10 MED ORDER — BUPIVACAINE LIPOSOME 1.3 % IJ SUSP
INTRAMUSCULAR | Status: DC | PRN
Start: 2014-02-10 — End: 2014-02-10
  Administered 2014-02-10: 20 mL

## 2014-02-10 MED ORDER — LIDOCAINE HCL (CARDIAC) 20 MG/ML IV SOLN
INTRAVENOUS | Status: DC | PRN
  Administered 2014-02-10: 100 mg via INTRAVENOUS

## 2014-02-10 MED ORDER — KCL IN DEXTROSE-NACL 20-5-0.9 MEQ/L-%-% IV SOLN
INTRAVENOUS | Status: DC
  Administered 2014-02-10: 18:00:00 via INTRAVENOUS
  Filled 2014-02-10 (×5): qty 1000

## 2014-02-10 MED ORDER — METOCLOPRAMIDE HCL 10 MG PO TABS: 5.0000 mg | ORAL_TABLET | Freq: Three times a day (TID) | ORAL | Status: DC | PRN

## 2014-02-10 MED ORDER — PROPOFOL INFUSION 10 MG/ML OPTIME
INTRAVENOUS | Status: DC | PRN
  Administered 2014-02-10: 25 ug/kg/min via INTRAVENOUS

## 2014-02-10 MED ORDER — CEFAZOLIN SODIUM-DEXTROSE 2-3 GM-% IV SOLR
2.0000 g | Freq: Four times a day (QID) | INTRAVENOUS | Status: AC
  Administered 2014-02-10 (×2): 2 g via INTRAVENOUS
  Filled 2014-02-10 (×2): qty 50

## 2014-02-10 MED ORDER — KETOROLAC TROMETHAMINE 30 MG/ML IJ SOLN
INTRAMUSCULAR | Status: AC
  Filled 2014-02-10: qty 1

## 2014-02-10 MED ORDER — SUCCINYLCHOLINE CHLORIDE 20 MG/ML IJ SOLN
INTRAMUSCULAR | Status: DC | PRN
  Administered 2014-02-10: 100 mg via INTRAVENOUS

## 2014-02-10 MED ORDER — TRAMADOL HCL 50 MG PO TABS
50.0000 mg | ORAL_TABLET | Freq: Four times a day (QID) | ORAL | Status: DC | PRN
  Administered 2014-02-11: 100 mg via ORAL
  Filled 2014-02-10 (×2): qty 2

## 2014-02-10 MED ORDER — PANTOPRAZOLE SODIUM 40 MG PO TBEC
40.0000 mg | DELAYED_RELEASE_TABLET | Freq: Every day | ORAL | Status: DC
  Administered 2014-02-11: 40 mg via ORAL
  Filled 2014-02-10 (×2): qty 1

## 2014-02-10 MED ORDER — ONDANSETRON HCL 4 MG/2ML IJ SOLN
INTRAMUSCULAR | Status: AC
  Filled 2014-02-10: qty 2

## 2014-02-10 MED ORDER — ONDANSETRON HCL 4 MG/2ML IJ SOLN: 4.0000 mg | Freq: Four times a day (QID) | INTRAMUSCULAR | Status: DC | PRN

## 2014-02-10 MED ORDER — BUPIVACAINE HCL (PF) 0.25 % IJ SOLN
INTRAMUSCULAR | Status: AC
  Filled 2014-02-10: qty 30

## 2014-02-10 MED ORDER — ACETAMINOPHEN 10 MG/ML IV SOLN
1000.0000 mg | Freq: Once | INTRAVENOUS | Status: AC
  Administered 2014-02-10: 1000 mg via INTRAVENOUS
  Filled 2014-02-10: qty 100

## 2014-02-10 MED ORDER — ONDANSETRON HCL 4 MG PO TABS: 4.0000 mg | ORAL_TABLET | Freq: Four times a day (QID) | ORAL | Status: DC | PRN

## 2014-02-10 MED ORDER — FENTANYL CITRATE 0.05 MG/ML IJ SOLN
INTRAMUSCULAR | Status: AC
  Filled 2014-02-10: qty 5

## 2014-02-10 MED ORDER — METOCLOPRAMIDE HCL 5 MG/ML IJ SOLN
INTRAMUSCULAR | Status: AC
  Filled 2014-02-10: qty 2

## 2014-02-10 MED ORDER — KETOROLAC TROMETHAMINE 15 MG/ML IJ SOLN
7.5000 mg | Freq: Four times a day (QID) | INTRAMUSCULAR | Status: AC | PRN
  Administered 2014-02-10: 7.5 mg via INTRAVENOUS
  Filled 2014-02-10: qty 1

## 2014-02-10 MED ORDER — DOCUSATE SODIUM 100 MG PO CAPS
100.0000 mg | ORAL_CAPSULE | Freq: Two times a day (BID) | ORAL | Status: DC
  Administered 2014-02-10 – 2014-02-11 (×3): 100 mg via ORAL

## 2014-02-10 MED ORDER — 0.9 % SODIUM CHLORIDE (POUR BTL) OPTIME
TOPICAL | Status: DC | PRN
  Administered 2014-02-10: 1000 mL

## 2014-02-10 MED ORDER — METOCLOPRAMIDE HCL 5 MG/ML IJ SOLN
INTRAMUSCULAR | Status: DC | PRN
  Administered 2014-02-10: 10 mg via INTRAVENOUS

## 2014-02-10 MED ORDER — MIDAZOLAM HCL 2 MG/2ML IJ SOLN
INTRAMUSCULAR | Status: AC
  Filled 2014-02-10: qty 2

## 2014-02-10 MED ORDER — FENTANYL CITRATE 0.05 MG/ML IJ SOLN
INTRAMUSCULAR | Status: DC | PRN
  Administered 2014-02-10: 50 ug via INTRAVENOUS
  Administered 2014-02-10: 100 ug via INTRAVENOUS
  Administered 2014-02-10 (×2): 50 ug via INTRAVENOUS

## 2014-02-10 MED ORDER — SODIUM CHLORIDE 0.9 % IJ SOLN
INTRAMUSCULAR | Status: AC
  Filled 2014-02-10: qty 50

## 2014-02-10 MED ORDER — PHENOL 1.4 % MT LIQD: 1.0000 | OROMUCOSAL | Status: DC | PRN

## 2014-02-10 MED ORDER — BUPIVACAINE LIPOSOME 1.3 % IJ SUSP
20.0000 mL | Freq: Once | INTRAMUSCULAR | Status: DC
  Filled 2014-02-10: qty 20

## 2014-02-10 MED ORDER — ONDANSETRON HCL 4 MG/2ML IJ SOLN
INTRAMUSCULAR | Status: DC | PRN
  Administered 2014-02-10: 4 mg via INTRAVENOUS

## 2014-02-10 MED ORDER — NEOSTIGMINE METHYLSULFATE 10 MG/10ML IV SOLN
INTRAVENOUS | Status: DC | PRN
  Administered 2014-02-10: 5 mg via INTRAVENOUS

## 2014-02-10 MED ORDER — SODIUM CHLORIDE 0.9 % IV SOLN
INTRAVENOUS | Status: DC
Start: 2014-02-10 — End: 2014-02-10

## 2014-02-10 MED ORDER — DEXAMETHASONE SODIUM PHOSPHATE 10 MG/ML IJ SOLN
10.0000 mg | Freq: Once | INTRAMUSCULAR | Status: AC
  Administered 2014-02-10: 10 mg via INTRAVENOUS

## 2014-02-10 MED ORDER — PROPOFOL 10 MG/ML IV BOLUS
INTRAVENOUS | Status: AC
  Filled 2014-02-10: qty 20

## 2014-02-10 MED ORDER — BISACODYL 10 MG RE SUPP: 10.0000 mg | Freq: Every day | RECTAL | Status: DC | PRN

## 2014-02-10 MED ORDER — HYDROMORPHONE HCL 1 MG/ML IJ SOLN
INTRAMUSCULAR | Status: DC | PRN
  Administered 2014-02-10: 0.5 mg via INTRAVENOUS
  Administered 2014-02-10: 1 mg via INTRAVENOUS
  Administered 2014-02-10 (×4): 0.5 mg via INTRAVENOUS

## 2014-02-10 MED ORDER — ROCURONIUM BROMIDE 100 MG/10ML IV SOLN
INTRAVENOUS | Status: DC | PRN
  Administered 2014-02-10: 30 mg via INTRAVENOUS

## 2014-02-10 MED ORDER — KETOROLAC TROMETHAMINE 30 MG/ML IJ SOLN
INTRAMUSCULAR | Status: DC | PRN
  Administered 2014-02-10: 30 mg via INTRAVENOUS

## 2014-02-10 MED ORDER — PROPOFOL 10 MG/ML IV BOLUS
INTRAVENOUS | Status: DC | PRN
  Administered 2014-02-10: 150 mg via INTRAVENOUS

## 2014-02-10 MED ORDER — CEFAZOLIN SODIUM-DEXTROSE 2-3 GM-% IV SOLR
INTRAVENOUS | Status: AC
  Filled 2014-02-10: qty 50

## 2014-02-10 MED ORDER — LIDOCAINE HCL (CARDIAC) 20 MG/ML IV SOLN
INTRAVENOUS | Status: AC
  Filled 2014-02-10: qty 5

## 2014-02-10 MED ORDER — DEXAMETHASONE SODIUM PHOSPHATE 10 MG/ML IJ SOLN
10.0000 mg | Freq: Once | INTRAMUSCULAR | Status: DC
  Filled 2014-02-10: qty 1

## 2014-02-10 MED ORDER — CEFAZOLIN SODIUM-DEXTROSE 2-3 GM-% IV SOLR
2.0000 g | INTRAVENOUS | Status: AC
  Administered 2014-02-10: 2 g via INTRAVENOUS

## 2014-02-10 MED ORDER — ONDANSETRON HCL 4 MG/2ML IJ SOLN: 4.0000 mg | Freq: Once | INTRAMUSCULAR | Status: DC | PRN

## 2014-02-10 MED ORDER — SODIUM CHLORIDE 0.9 % IR SOLN
Status: DC | PRN
  Administered 2014-02-10: 1000 mL

## 2014-02-10 MED ORDER — GLYCOPYRROLATE 0.2 MG/ML IJ SOLN
INTRAMUSCULAR | Status: DC | PRN
  Administered 2014-02-10: 0.6 mg via INTRAVENOUS

## 2014-02-10 MED ORDER — METHOCARBAMOL 1000 MG/10ML IJ SOLN
500.0000 mg | Freq: Four times a day (QID) | INTRAVENOUS | Status: DC | PRN
  Administered 2014-02-10: 500 mg via INTRAVENOUS
  Filled 2014-02-10: qty 5

## 2014-02-10 MED ORDER — ACETAMINOPHEN 325 MG PO TABS: 650.0000 mg | ORAL_TABLET | Freq: Four times a day (QID) | ORAL | Status: DC | PRN

## 2014-02-10 MED ORDER — SODIUM CHLORIDE 0.9 % IV SOLN
INTRAVENOUS | Status: DC | PRN
Start: 2014-02-10 — End: 2014-02-10
  Administered 2014-02-10: 30 mL via INTRAMUSCULAR

## 2014-02-10 MED ORDER — MIDAZOLAM HCL 5 MG/5ML IJ SOLN
INTRAMUSCULAR | Status: DC | PRN
  Administered 2014-02-10: 2 mg via INTRAVENOUS

## 2014-02-10 MED ORDER — MORPHINE SULFATE 2 MG/ML IJ SOLN
1.0000 mg | INTRAMUSCULAR | Status: DC | PRN
  Administered 2014-02-10 – 2014-02-11 (×6): 2 mg via INTRAVENOUS
  Filled 2014-02-10 (×6): qty 1

## 2014-02-10 MED ORDER — METOCLOPRAMIDE HCL 5 MG/ML IJ SOLN: 5.0000 mg | Freq: Three times a day (TID) | INTRAMUSCULAR | Status: DC | PRN

## 2014-02-10 MED ORDER — HYDROMORPHONE HCL 2 MG/ML IJ SOLN
INTRAMUSCULAR | Status: AC
  Filled 2014-02-10: qty 1

## 2014-02-10 MED ORDER — HYDROMORPHONE HCL 2 MG PO TABS
2.0000 mg | ORAL_TABLET | ORAL | Status: DC | PRN
  Administered 2014-02-10 (×2): 2 mg via ORAL
  Administered 2014-02-11: 4 mg via ORAL
  Administered 2014-02-11 (×2): 2 mg via ORAL
  Administered 2014-02-11 – 2014-02-12 (×4): 4 mg via ORAL
  Filled 2014-02-10: qty 1
  Filled 2014-02-10: qty 2
  Filled 2014-02-10 (×3): qty 1
  Filled 2014-02-10 (×4): qty 2

## 2014-02-10 MED ORDER — POLYETHYLENE GLYCOL 3350 17 G PO PACK: 17.0000 g | PACK | Freq: Every day | ORAL | Status: DC | PRN

## 2014-02-10 MED ORDER — MENTHOL 3 MG MT LOZG
1.0000 | LOZENGE | OROMUCOSAL | Status: DC | PRN
Start: 2014-02-10 — End: 2014-02-12
  Filled 2014-02-10: qty 9

## 2014-02-10 MED ORDER — FENTANYL CITRATE 0.05 MG/ML IJ SOLN
INTRAMUSCULAR | Status: AC
Start: 2014-02-10 — End: 2014-02-11
  Filled 2014-02-10: qty 2

## 2014-02-10 MED ORDER — ACETAMINOPHEN 500 MG PO TABS
1000.0000 mg | ORAL_TABLET | Freq: Four times a day (QID) | ORAL | Status: AC
  Administered 2014-02-10 – 2014-02-11 (×4): 1000 mg via ORAL
  Filled 2014-02-10 (×5): qty 2

## 2014-02-10 MED ORDER — DIPHENHYDRAMINE HCL 12.5 MG/5ML PO ELIX: 12.5000 mg | ORAL_SOLUTION | ORAL | Status: DC | PRN

## 2014-02-10 MED ORDER — FENTANYL CITRATE 0.05 MG/ML IJ SOLN
25.0000 ug | INTRAMUSCULAR | Status: DC | PRN
  Administered 2014-02-10 (×2): 50 ug via INTRAVENOUS

## 2014-02-10 MED ORDER — GLYCOPYRROLATE 0.2 MG/ML IJ SOLN
INTRAMUSCULAR | Status: AC
  Filled 2014-02-10: qty 3

## 2014-02-10 MED ORDER — ASPIRIN-ACETAMINOPHEN-CAFFEINE 250-250-65 MG PO TABS
1.0000 | ORAL_TABLET | Freq: Once | ORAL | Status: AC
  Administered 2014-02-10: 1 via ORAL
  Filled 2014-02-10: qty 2

## 2014-02-10 MED ORDER — FLEET ENEMA 7-19 GM/118ML RE ENEM: 1.0000 | ENEMA | Freq: Once | RECTAL | Status: AC | PRN

## 2014-02-10 MED ORDER — CLONAZEPAM 0.5 MG PO TABS
0.2500 mg | ORAL_TABLET | Freq: Two times a day (BID) | ORAL | Status: DC | PRN
  Administered 2014-02-10: 0.5 mg via ORAL
  Filled 2014-02-10: qty 1

## 2014-02-10 MED ORDER — BUPIVACAINE HCL (PF) 0.25 % IJ SOLN
INTRAMUSCULAR | Status: DC | PRN
Start: 2014-02-10 — End: 2014-02-10
  Administered 2014-02-10: 20 mL

## 2014-02-10 MED ORDER — TRANEXAMIC ACID 100 MG/ML IV SOLN
1000.0000 mg | INTRAVENOUS | Status: AC
  Administered 2014-02-10: 1000 mg via INTRAVENOUS
  Filled 2014-02-10: qty 10

## 2014-02-10 MED ORDER — RIVAROXABAN 10 MG PO TABS
10.0000 mg | ORAL_TABLET | Freq: Every day | ORAL | Status: DC
  Administered 2014-02-11 – 2014-02-12 (×2): 10 mg via ORAL
  Filled 2014-02-10 (×4): qty 1

## 2014-02-10 MED ORDER — CHLORHEXIDINE GLUCONATE 4 % EX LIQD: 60.0000 mL | Freq: Once | CUTANEOUS | Status: DC

## 2014-02-10 MED ORDER — SCOPOLAMINE 1 MG/3DAYS TD PT72
1.0000 | MEDICATED_PATCH | TRANSDERMAL | Status: DC
  Administered 2014-02-10: 1 via TRANSDERMAL
  Filled 2014-02-10: qty 1

## 2014-02-10 MED ORDER — DEXAMETHASONE SODIUM PHOSPHATE 10 MG/ML IJ SOLN
INTRAMUSCULAR | Status: AC
  Filled 2014-02-10: qty 1

## 2014-02-10 SURGICAL SUPPLY — 51 items
BAG ZIPLOCK 12X15 (MISCELLANEOUS) ×2 IMPLANT
BANDAGE ELASTIC 6 VELCRO ST LF (GAUZE/BANDAGES/DRESSINGS) ×2 IMPLANT
BANDAGE ESMARK 6X9 LF (GAUZE/BANDAGES/DRESSINGS) ×1 IMPLANT
BLADE SAW RECIPROCATING 77.5 (BLADE) ×2 IMPLANT
BLADE SAW SGTL 13.0X1.19X90.0M (BLADE) ×2 IMPLANT
BNDG ESMARK 6X9 LF (GAUZE/BANDAGES/DRESSINGS) ×2
BOWL SMART MIX CTS (DISPOSABLE) ×2 IMPLANT
BUR OVAL CARBIDE 4.0 (BURR) ×2 IMPLANT
CAPT KNEE PARTIAL 2 ×1 IMPLANT
CEMENT HV SMART SET (Cement) ×2 IMPLANT
CUFF TOURN SGL QUICK 34 (TOURNIQUET CUFF) ×1
CUFF TRNQT CYL 34X4X40X1 (TOURNIQUET CUFF) ×1 IMPLANT
DRAPE EXTREMITY TIBURON (DRAPES) ×2 IMPLANT
DRAPE POUCH INSTRU U-SHP 10X18 (DRAPES) ×2 IMPLANT
DRSG ADAPTIC 3X8 NADH LF (GAUZE/BANDAGES/DRESSINGS) ×2 IMPLANT
DRSG PAD ABDOMINAL 8X10 ST (GAUZE/BANDAGES/DRESSINGS) ×2 IMPLANT
DURAPREP 26ML APPLICATOR (WOUND CARE) ×2 IMPLANT
ELECT REM PT RETURN 9FT ADLT (ELECTROSURGICAL) ×2
ELECTRODE REM PT RTRN 9FT ADLT (ELECTROSURGICAL) ×1 IMPLANT
EVACUATOR 1/8 PVC DRAIN (DRAIN) ×2 IMPLANT
FACESHIELD WRAPAROUND (MASK) ×10 IMPLANT
GAUZE SPONGE 4X4 12PLY STRL (GAUZE/BANDAGES/DRESSINGS) ×2 IMPLANT
GLOVE BIO SURGEON STRL SZ7.5 (GLOVE) ×4 IMPLANT
GLOVE BIO SURGEON STRL SZ8 (GLOVE) ×2 IMPLANT
GLOVE BIOGEL PI IND STRL 8 (GLOVE) ×2 IMPLANT
GLOVE BIOGEL PI INDICATOR 8 (GLOVE) ×2
GOWN STRL REUS W/TWL LRG LVL3 (GOWN DISPOSABLE) ×2 IMPLANT
GOWN STRL REUS W/TWL XL LVL3 (GOWN DISPOSABLE) ×2 IMPLANT
HANDPIECE INTERPULSE COAX TIP (DISPOSABLE) ×1
IMMOBILIZER KNEE 20 (SOFTGOODS) ×2
IMMOBILIZER KNEE 20 THIGH 36 (SOFTGOODS) ×1 IMPLANT
KIT BASIN OR (CUSTOM PROCEDURE TRAY) ×2 IMPLANT
KIT IMPL STRL TIB IPOLY IUNI ×1 IMPLANT
MANIFOLD NEPTUNE II (INSTRUMENTS) ×2 IMPLANT
NDL SAFETY ECLIPSE 18X1.5 (NEEDLE) ×2 IMPLANT
NEEDLE HYPO 18GX1.5 SHARP (NEEDLE) ×2
PACK TOTAL JOINT (CUSTOM PROCEDURE TRAY) ×2 IMPLANT
PADDING CAST COTTON 6X4 STRL (CAST SUPPLIES) ×4 IMPLANT
POSITIONER SURGICAL ARM (MISCELLANEOUS) ×2 IMPLANT
SET HNDPC FAN SPRY TIP SCT (DISPOSABLE) ×1 IMPLANT
STRIP CLOSURE SKIN 1/2X4 (GAUZE/BANDAGES/DRESSINGS) ×4 IMPLANT
SUCTION FRAZIER 12FR DISP (SUCTIONS) ×2 IMPLANT
SUT MNCRL AB 4-0 PS2 18 (SUTURE) ×2 IMPLANT
SUT VIC AB 2-0 CT1 27 (SUTURE) ×2
SUT VIC AB 2-0 CT1 TAPERPNT 27 (SUTURE) ×2 IMPLANT
SUT VLOC 180 0 24IN GS25 (SUTURE) ×2 IMPLANT
SYR 20CC LL (SYRINGE) ×2 IMPLANT
SYR 50ML LL SCALE MARK (SYRINGE) ×2 IMPLANT
TOWEL OR 17X26 10 PK STRL BLUE (TOWEL DISPOSABLE) ×2 IMPLANT
TRAY FOLEY CATH 14FRSI W/METER (CATHETERS) ×2 IMPLANT
WRAP KNEE MAXI GEL POST OP (GAUZE/BANDAGES/DRESSINGS) ×2 IMPLANT

## 2014-02-10 NOTE — Transfer of Care (Signed)
Immediate Anesthesia Transfer of Care Note  Patient: Patricia Bauer  Procedure(s) Performed: Procedure(s): LEFT MEDIAL UNICOMPARTMENTAL KNEE ARTHROPLASTY (Left)  Patient Location: PACU  Anesthesia Type:General  Level of Consciousness: awake, alert  and oriented  Airway & Oxygen Therapy: Patient Spontanous Breathing and Patient connected to face mask oxygen  Post-op Assessment: Report given to PACU RN and Post -op Vital signs reviewed and stable  Post vital signs: Reviewed and stable  Complications: No apparent anesthesia complications

## 2014-02-10 NOTE — H&P (View-Only) (Signed)
Patricia Bauer DOB: 1971-02-01 Divorced / Language: English / Race: Asian, White Female Date of Admission:  02-10-2014 Chief Complaint:  Left knee pain History of Present Illness  The patient is a 43 year old female who comes in for a preoperative History and Physical. The patient is scheduled for a left uni copartmental replacement to be performed by Dr. Dione Plover. Aluisio, MD at [facility] on 02/10/2014. The patient is a 43 year old female who presents for follow up of their knee. The patient is being followed for their left knee pain and osteoarthritis. Symptoms reported today include: pain (mostly medially) and difficulty ambulating. The patient feels that they are doing poorly and report their pain level to be moderate to severe. Current treatment includes: unloader brace. The following medication has been used for pain control: Oxycodone (on occasion) and Ultram. The patient has reported improvement of their symptoms with: bracing (the brace is helping a little). Kialee's knee continues to get worse. The pain is mainly along the medial aspect of the left knee. She is not getting swelling. It is not giving out. Work comp had sent her for a second opinion at a sports practice up in [place]. The opinion was concurrent with ours about her needing a unicompartmental replacement. She has now been approved and is ready to proceed with surgery at this time. They have been treated conservatively in the past for the above stated problem and despite conservative measures, they continue to have progressive pain and severe functional limitations and dysfunction. They have failed non-operative management including home exercise, medications, and injections. It is felt that they would benefit from undergoing unicompartmental joint replacement. Risks and benefits of the procedure have been discussed with the patient and they elect to proceed with surgery. There are no active contraindications to surgery  such as ongoing infection or rapidly progressive neurological disease.  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 migranes  Family History Hypertension First Degree Relatives. mother Cerebrovascular Accident father Diabetes Mellitus father  Social History Children 1 Alcohol use current drinker; drinks wine and hard liquor; only occasionally per week Exercise Exercises rarely; does running / walking Pain Contract no Number of flights of stairs before winded 2-3 Current work status working full time Drug/Alcohol Rehab (Previously) no Drug/Alcohol Rehab (Currently) no Tobacco use Never smoker. never smoker Marital status divorced Living situation live alone Illicit drug use no  Medication History Pantoprazole Sodium (Oral) Specific dose unknown - Active. TraMADol HCl (50MG  Tablet, Oral) Active.  Past Surgical History Arthroscopy of Knee left Hysterectomy Date: 2013. partial (non-cancerous)  Medical History Anemia Gastroesophageal Reflux Disease Migraine Headache   Review of Systems  General Not Present- Chills, Fatigue, Fever, Memory Loss, Night Sweats, Weight Gain and Weight Loss. Skin Not Present- Eczema, Hives, Itching, Lesions and Rash. HEENT Not Present- Dentures, Double Vision, Headache, Hearing Loss, Tinnitus and Visual Loss. Respiratory Present- Chronic Cough (likely from the reflux). Not Present- Allergies, Coughing up blood, Shortness of breath at rest and Shortness of breath with exertion. Cardiovascular Not Present- Chest Pain, Difficulty Breathing Lying Down, Murmur, Palpitations, Racing/skipping heartbeats and Swelling. Gastrointestinal Not Present- Abdominal Pain, Bloody Stool, Constipation, Diarrhea, Difficulty Swallowing, Heartburn, Jaundice, Loss of appetitie, Nausea and Vomiting. Female  Genitourinary Not Present- Blood in Urine, Discharge, Flank Pain, Incontinence, Painful Urination, Urgency, Urinary frequency, Urinary Retention, Urinating at Night and Weak urinary stream. Musculoskeletal Present- Joint Pain. Not  Present- Back Pain, Joint Swelling, Morning Stiffness, Muscle Pain, Muscle Weakness and Spasms. Neurological Not Present- Blackout spells, Difficulty with balance, Dizziness, Paralysis, Tremor and Weakness. Psychiatric Not Present- Insomnia.  Vitals  Weight: 193 lb Height: 65in Weight was reported by patient. Height was reported by patient. Body Surface Area: 2 m Body Mass Index: 32.12 kg/m  Pulse: 64 (Regular)  BP: 122/74 (Sitting, Right Arm, Standard)   Physical Exam  General Mental Status -Alert, cooperative and good historian. General Appearance-pleasant, Not in acute distress. Orientation-Oriented X3. Build & Nutrition-Well nourished and Well developed.  Head and Neck Head-normocephalic, atraumatic . Neck Global Assessment - supple, no bruit auscultated on the right, no bruit auscultated on the left.  Eye Vision-Wears contact lenses. Pupil - Bilateral-Regular and Round. Motion - Bilateral-EOMI.  Chest and Lung Exam Auscultation Breath sounds - clear at anterior chest wall and clear at posterior chest wall. Adventitious sounds - No Adventitious sounds.  Cardiovascular Auscultation Rhythm - Regular rate and rhythm. Heart Sounds - S1 WNL and S2 WNL. Murmurs & Other Heart Sounds - Auscultation of the heart reveals - No Murmurs.  Abdomen Palpation/Percussion Tenderness - Abdomen is non-tender to palpation. Rigidity (guarding) - Abdomen is soft. Auscultation Auscultation of the abdomen reveals - Bowel sounds normal.  Female Genitourinary Note: Not done, not pertinent to present illness   Musculoskeletal Note: General: She is in no distress.  Extremities: Her left knee shows no effusion. Her range is about 5 to  125. She does have some crepitus on range of motion; tenderness medial greater than lateral with no instability noted.   Assessment & Plan Primary osteoarthritis of one knee (M17.10) Note:Plan is for a Left UNI Knee Replacement by Dr. Wynelle Link.  Plan is to go home.  PCP - Dr. Manuella Ghazi  The patient does not have any contraindications and will receive TXA (tranexamic acid) prior to surgery.  Please note that the patient does get nauseated with anesthesia in the past.  Due to religious reasons, the patient wishes NOT to receive any blood products.  Signed electronically by Joelene Millin, III PA-C

## 2014-02-10 NOTE — Interval H&P Note (Signed)
History and Physical Interval Note:  02/10/2014 9:42 AM  Patricia Bauer  has presented today for surgery, with the diagnosis of LEFT KNEE MEDIAL COMPARTMENTAL OSTEO ARTHRITIS  The various methods of treatment have been discussed with the patient and family. After consideration of risks, benefits and other options for treatment, the patient has consented to  Procedure(s): LEFT MEDIAL UNICOMPARTMENTAL KNEE ARTHROPLASTY (Left) as a surgical intervention .  The patient's history has been reviewed, patient examined, no change in status, stable for surgery.  I have reviewed the patient's chart and labs.  Questions were answered to the patient's satisfaction.     Gearlean Alf

## 2014-02-10 NOTE — Anesthesia Postprocedure Evaluation (Signed)
  Anesthesia Post-op Note  Patient: Patricia Bauer  Procedure(s) Performed: Procedure(s) (LRB): LEFT MEDIAL UNICOMPARTMENTAL KNEE ARTHROPLASTY (Left)  Patient Location: PACU  Anesthesia Type: General  Level of Consciousness: awake and alert   Airway and Oxygen Therapy: Patient Spontanous Breathing  Post-op Pain: mild  Post-op Assessment: Post-op Vital signs reviewed, Patient's Cardiovascular Status Stable, Respiratory Function Stable, Patent Airway and No signs of Nausea or vomiting  Last Vitals:  Filed Vitals:   02/10/14 1313  BP: 123/78  Pulse: 75  Temp: 36.4 C  Resp: 12    Post-op Vital Signs: stable   Complications: No apparent anesthesia complications

## 2014-02-10 NOTE — Op Note (Signed)
OPERATIVE REPORT  PREOPERATIVE DIAGNOSIS: Medial compartment osteoarthritis, Left knee  POSTOPERATIVE DIAGNOSIS: Medial compartment osteoarthritis, Left knee  PROCEDURE:Left knee medial unicompartmental arthroplasty.   SURGEON: Gaynelle Arabian, MD   ASSISTANT: [name], PA-C  ANESTHESIA:  Spinal.   ESTIMATED BLOOD LOSS: Minimal.   DRAINS: Hemovac x1.   TOURNIQUET TIME: 36 minutes at 300 mmHg.   COMPLICATIONS: None.   CONDITION: Stable to recovery.   BRIEF CLINICAL NOTE: Patricia Bauer is a 43 y.o. female, who has  significant isolated medial compartment arthritis of the Left knee. She has had nonoperative management including injections. She has had  cortisone and viscous supplements. Unfortunately, the pain persists.  Radiograph showed isolated medial compartment bone-on-bone arthritis  with normal-appearing patellofemoral and lateral compartments. She  presents now for left knee unicompartmental arthroplasty.   PROCEDURE IN DETAIL: After successful administration of  Spinal anesthetic, a tourniquet was placed high on the left thigh and left lower extremity prepped and draped in usual sterile fashion. Extremity  was wrapped in an Esmarch, knee flexed, and tourniquet inflated to 300  mmHg. A midline incision was made with a 10 blade through subcutaneous  tissue to the extensor mechanism. A fresh blade was used to make a  medial parapatellar arthrotomy. Soft tissue on the proximal medial  tibia subperiosteally elevated to the joint line with a knife and into  the semimembranosus bursa with a Cobb elevator. The patella was  subluxed laterally, and the knee flexed 90 degrees. The ACL was intact.  The marginal osteophytes on the medial femur and tibia were removed with  a rongeur. The medial meniscus was also removed. The femoral cutting  block where the conformis unicompartmental knee system was placed along  the femur. There was excellent fit. I traced the  outline. We then  removed any remaining cartilage within this outline. We then placed the  cutting block again and pinned in position. The posterior femoral cut  was made, it was approximately 5 mm. The lug holes for the femoral  component were then drilled through the cutting block. The cutting  block was subsequently removed. We then utilized the high speed bur to  create a small trough at the superior aspect of the components that of  the inset and would not overhang the cartilage. The trial was placed,  it had excellent fit. The trial was subsequently removed.  The trial was placed again and the B chip was placed. There was an  excellent balance throughout full motion. Also with excellent fit on  her tibia. This was removed as was the femoral trial. A curette was  used to remove any remaining cartilage from the tibia. The tibial  cutting block was then placed and there was a perfect fit on the tibial  surface. The appropriate slope was placed and it was pinned in  position. The reciprocating saw was used to make the vertical cut and  then the oscillating saw used to make the horizontal cut. The bone  fragment was then removed. The tibial trial was placed and had perfect  fit on the tibia. We then drilled the 2 lug holes and did the keel punch.  We then placed tibia trial femur, and a 10 mm trial insert. There was  excellent stability throughout full range of motion and no impingement.  The trial was then removed. We used small drill holes for the distal  femur in order to create more conduits for the cement. The cut end  surfaces were thoroughly irrigated with  pulsatile lavage where the  cement was mixed on the back table. We then cemented the tibial  component into place, impacted it and removed the extruded cement. The  same was done for the femoral component. Trial 10-mm inserts placed,  knee held in full extension, and all extruded cement removed. While the  cement was hardening,  I injected the extensor mechanism, periosteum of  the femur and subcu tissues, a total of 20 mL of Exparel mixed with 30  mL of saline and then did an additional injection of 20 mL of 0.25%  Marcaine into the same tissues. When the cement had fully hardened,  then the permanent polyethylene was placed in tibial tray. There was  excellent stability throughout full range of motion with no lift off the  component and no evidence of any impingement. Wound was copiously  irrigated with saline solution, and the arthrotomy closed over a Hemovac  drain with a running #1 V-Loc suture. The subcutaneous was closed with  interrupted 2-0 Vicryl and subcuticular running 4-0 Monocryl. The drain  was hooked to suction. Incision cleaned and dried and Steri-Strips and  a bulky sterile dressing applied. The tourniquet was released after a  total time of 36 minutes. This was done after closing the extensor  mechanism. The wound was closed and a bulky sterile dressing was  applied. She was placed into a knee immobilizer, awakened and  transported to recovery room in stable condition.  Please note that a surgical assistant was a medical necessity for this  procedure in order to perform it in a safe and expeditious manner.  Assistance was necessary for retracting vital ligaments, neurovascular  structures, as well as for proper positioning of the limb to allow for  appropriate bone cuts and appropriate placement of the prosthesis.    Dione Plover Jireh Elmore, MD

## 2014-02-10 NOTE — Progress Notes (Signed)
CPM to be applied on floor

## 2014-02-10 NOTE — Anesthesia Preprocedure Evaluation (Addendum)
Anesthesia Evaluation  Patient identified by MRN, date of birth, ID band Patient awake    Reviewed: Allergy & Precautions, H&P , NPO status , Patient's Chart, lab work & pertinent test results  History of Anesthesia Complications (+) PONV and history of anesthetic complications  Airway Mallampati: II  TM Distance: >3 FB Neck ROM: Full    Dental no notable dental hx. (+) Dental Advisory Given   Pulmonary neg pulmonary ROS,  breath sounds clear to auscultation  Pulmonary exam normal       Cardiovascular negative cardio ROS  Rhythm:Regular Rate:Normal     Neuro/Psych  Headaches, PSYCHIATRIC DISORDERS Anxiety Depression    GI/Hepatic negative GI ROS, Neg liver ROS, GERD-  Controlled and Medicated,  Endo/Other  negative endocrine ROS  Renal/GU negative Renal ROS  negative genitourinary   Musculoskeletal negative musculoskeletal ROS (+) Arthritis -,   Abdominal   Peds negative pediatric ROS (+)  Hematology negative hematology ROS (+) JEHOVAH'S WITNESS (will accept albumin)  Anesthesia Other Findings   Reproductive/Obstetrics negative OB ROS                            Anesthesia Physical Anesthesia Plan  ASA: II  Anesthesia Plan: General   Post-op Pain Management:    Induction: Intravenous  Airway Management Planned: Oral ETT  Additional Equipment:   Intra-op Plan:   Post-operative Plan: Extubation in OR  Informed Consent: I have reviewed the patients History and Physical, chart, labs and discussed the procedure including the risks, benefits and alternatives for the proposed anesthesia with the patient or authorized representative who has indicated his/her understanding and acceptance.   Dental advisory given  Plan Discussed with: CRNA  Anesthesia Plan Comments:         Anesthesia Quick Evaluation

## 2014-02-11 ENCOUNTER — Encounter (HOSPITAL_COMMUNITY): Payer: Self-pay | Admitting: Orthopedic Surgery

## 2014-02-11 LAB — CBC
HCT: 31.9 % — ABNORMAL LOW (ref 36.0–46.0)
HEMOGLOBIN: 10.5 g/dL — AB (ref 12.0–15.0)
MCH: 27.8 pg (ref 26.0–34.0)
MCHC: 32.9 g/dL (ref 30.0–36.0)
MCV: 84.4 fL (ref 78.0–100.0)
PLATELETS: 270 10*3/uL (ref 150–400)
RBC: 3.78 MIL/uL — ABNORMAL LOW (ref 3.87–5.11)
RDW: 13.3 % (ref 11.5–15.5)
WBC: 12.4 10*3/uL — ABNORMAL HIGH (ref 4.0–10.5)

## 2014-02-11 LAB — BASIC METABOLIC PANEL
Anion gap: 11 (ref 5–15)
BUN: 8 mg/dL (ref 6–23)
CALCIUM: 8.6 mg/dL (ref 8.4–10.5)
CO2: 23 mEq/L (ref 19–32)
Chloride: 102 mEq/L (ref 96–112)
Creatinine, Ser: 0.79 mg/dL (ref 0.50–1.10)
Glucose, Bld: 137 mg/dL — ABNORMAL HIGH (ref 70–99)
POTASSIUM: 4 meq/L (ref 3.7–5.3)
SODIUM: 136 meq/L — AB (ref 137–147)

## 2014-02-11 MED ORDER — RIVAROXABAN 10 MG PO TABS: 10.0000 mg | ORAL_TABLET | Freq: Every day | ORAL | Status: DC

## 2014-02-11 MED ORDER — HYDROMORPHONE HCL 2 MG PO TABS: 2.0000 mg | ORAL_TABLET | ORAL | Status: DC | PRN

## 2014-02-11 MED ORDER — TRAMADOL HCL 50 MG PO TABS: 50.0000 mg | ORAL_TABLET | Freq: Four times a day (QID) | ORAL | Status: DC | PRN

## 2014-02-11 MED ORDER — METHOCARBAMOL 500 MG PO TABS: 500.0000 mg | ORAL_TABLET | Freq: Four times a day (QID) | ORAL | Status: DC | PRN

## 2014-02-11 NOTE — Discharge Instructions (Addendum)
Dr. Gaynelle Arabian [facility] [address] [place], [place] [address] [phone]  UNI KNEE REPLACEMENT POSTOPERATIVE DIRECTIONS   Knee Rehabilitation, Guidelines Following Surgery  Results after knee surgery are often greatly improved when you follow the exercise, range of motion and muscle strengthening exercises prescribed by your doctor. Safety measures are also important to protect the knee from further injury. Any time any of these exercises cause you to have increased pain or swelling in your knee joint, decrease the amount until you are comfortable again and slowly increase them. If you have problems or questions, call your caregiver or physical therapist for advice.   HOME CARE INSTRUCTIONS  Remove items at home which could result in a fall. This includes throw rugs or furniture in walking pathways.  Continue medications as instructed at time of discharge. You may have some home medications which will be placed on hold until you complete the course of blood thinner medication.  You may start showering once you are discharged home but do not submerge the incision under water. Just pat the incision dry and apply a dry gauze dressing on daily. Walk with walker as instructed.  You may resume a sexual relationship in one month or when given the OK by  your doctor.   Use walker as long as suggested by your caregivers.  Avoid periods of inactivity such as sitting longer than an hour when not asleep. This helps prevent blood clots.  You may put full weight on your legs and walk as much as is comfortable.  You may return to work once you are cleared by your doctor.  Do not drive a car for 6 weeks or until released by you surgeon.   Do not drive while taking narcotics.  Wear the elastic stockings for three weeks following surgery during the day but you may remove then at night. Make sure you keep all of your appointments after your  operation with all of your doctors and caregivers. You should call the office at the above phone number and make an appointment for approximately two weeks after the date of your surgery. Change the dressing daily and reapply a dry dressing each time. Please pick up a stool softener and laxative for home use as long as you are requiring pain medications.  Continue to use ice on the knee for pain and swelling from surgery. You may notice swelling that will progress down to the foot and ankle.  This is normal after surgery.  Elevate the leg when you are not up walking on it.   It is important for you to complete the blood thinner medication as prescribed by your doctor.  Continue to use the breathing machine which will help keep your temperature down.  It is common for your temperature to cycle up and down following surgery, especially at night when you are not up moving around and exerting yourself.  The breathing machine keeps your lungs expanded and your temperature down.  RANGE OF MOTION AND STRENGTHENING EXERCISES  Rehabilitation of the knee is important following a knee injury or an operation. After just a few days of immobilization, the muscles of the thigh which control the knee become weakened and shrink (atrophy). Knee exercises are designed to build up the tone and strength of the thigh muscles and to improve knee motion. Often times heat used for twenty to thirty minutes before working out will loosen up your tissues and help with improving the range  of motion but do not use heat for the first two weeks following surgery. These exercises can be done on a training (exercise) mat, on the floor, on a table or on a bed. Use what ever works the best and is most comfortable for you Knee exercises include:  Leg Lifts - While your knee is still immobilized in a splint or cast, you can do straight leg raises. Lift the leg to 60 degrees, hold for 3 sec, and slowly lower the leg. Repeat 10-20 times 2-3  times daily. Perform this exercise against resistance later as your knee gets better.  Quad and Hamstring Sets - Tighten up the muscle on the front of the thigh (Quad) and hold for 5-10 sec. Repeat this 10-20 times hourly. Hamstring sets are done by pushing the foot backward against an object and holding for 5-10 sec. Repeat as with quad sets.  A rehabilitation program following serious knee injuries can speed recovery and prevent re-injury in the future due to weakened muscles. Contact your doctor or a physical therapist for more information on knee rehabilitation.   SKILLED REHAB INSTRUCTIONS: If the patient is transferred to a skilled rehab facility following release from the hospital, a list of the current medications will be sent to the facility for the patient to continue.  When discharged from the skilled rehab facility, please have the facility set up the patient's [place] prior to being released. Also, the skilled facility will be responsible for providing the patient with their medications at time of release from the facility to include their pain medication, the muscle relaxants, and their blood thinner medication. If the patient is still at the rehab facility at time of the two week follow up appointment, the skilled rehab facility will also need to assist the patient in arranging follow up appointment in our office and any transportation needs.  MAKE SURE YOU:  Understand these instructions.  Will watch your condition.  Will get help right away if you are not doing well or get worse.    Pick up stool softner and laxative for home. Do not submerge incision under water. May shower. Continue to use ice for pain and swelling from surgery.  Take Xarelto for a total of two weeks, then discontinue Xarelto. After completing the Xarelto, take a full dose 325 mg Aspirin for three more weeks.    Information on my medicine - XARELTO (Rivaroxaban)  This medication  education was reviewed with me or my healthcare representative as part of my discharge preparation.  The pharmacist that spoke with me during my hospital stay was:  Julio Sicks, [facility]  Why was Xarelto prescribed for you? Xarelto was prescribed for you to reduce the risk of blood clots forming after orthopedic surgery. The medical term for these abnormal blood clots is venous thromboembolism (VTE).  What do you need to know about xarelto ? Take your Xarelto ONCE DAILY at the same time every day. You may take it either with or without food.  If you have difficulty swallowing the tablet whole, you may crush it and mix in applesauce just prior to taking your dose.  Take Xarelto exactly as prescribed by your doctor and DO NOT stop taking Xarelto without talking to the doctor who prescribed the medication.  Stopping without other VTE prevention medication to take the place of Xarelto may increase your risk of developing a clot.  After discharge, you should have regular check-up appointments with your healthcare provider that is  prescribing your Xarelto.    What do you do if you miss a dose? If you miss a dose, take it as soon as you remember on the same day then continue your regularly scheduled once daily regimen the next day. Do not take two doses of Xarelto on the same day.   Important Safety Information A possible side effect of Xarelto is bleeding. You should call your healthcare provider right away if you experience any of the following:   Bleeding from an injury or your nose that does not stop.   Unusual colored urine (red or dark brown) or unusual colored stools (red or black).   Unusual bruising for unknown reasons.   A serious fall or if you hit your head (even if there is no bleeding).  Some medicines may interact with Xarelto and might increase your risk of bleeding while on Xarelto. To help avoid this, consult your healthcare provider or pharmacist prior to using any new  prescription or non-prescription medications, including herbals, vitamins, non-steroidal anti-inflammatory drugs (NSAIDs) and supplements.  This website has more information on Xarelto: [email or web address]

## 2014-02-11 NOTE — Progress Notes (Signed)
reinstered pts iv. Gave 2mg  morphine for pain control. Will cont to assess.

## 2014-02-11 NOTE — Discharge Summary (Signed)
Physician Discharge Summary   [name] MRN: [id number] DOB/AGE: May 26, 1970 43 y.o.  Admit date: 02/10/2014 Discharge date: 02/11/2014  Primary Diagnosis:  Medial compartment osteoarthritis, Left knee  Admission Diagnoses:  Past Medical History  Diagnosis Date  . Anemia   . Anxiety   . GERD (gastroesophageal reflux disease)   . History of hysterectomy     still has both ovaries  . History of migraine headaches   . PONV (postoperative nausea and vomiting)   . Depression   . Headache     migraines    Discharge Diagnoses:   Principal Problem:   OA (osteoarthritis) of knee  Estimated body mass index is 31.95 kg/(m^2) as calculated from the following:   Height as of this encounter: '5\' 5"'  (1.651 m).   Weight as of this encounter: 87.091 kg (192 lb).  Procedure:  Procedure(s) (LRB): LEFT MEDIAL UNICOMPARTMENTAL KNEE ARTHROPLASTY (Left)   Consults: None  HPI: Patricia Bauer is a 43 y.o. female, who has  significant isolated medial compartment arthritis of the Left knee. She  has had nonoperative management including injections. She has had  cortisone and viscous supplements. Unfortunately, the pain persists.  Radiograph showed isolated medial compartment bone-on-bone arthritis  with normal-appearing patellofemoral and lateral compartments. She  presents now for left knee unicompartmental arthroplasty.   Laboratory Data: Admission on 02/10/2014  Component Date Value Ref Range Status  . WBC 02/11/2014 12.4* 4.0 - 10.5 K/uL Final  . RBC 02/11/2014 3.78* 3.87 - 5.11 MIL/uL Final  . Hemoglobin 02/11/2014 10.5* 12.0 - 15.0 g/dL Final  . HCT 02/11/2014 31.9* 36.0 - 46.0 % Final  . MCV 02/11/2014 84.4  78.0 - 100.0 fL Final  . MCH 02/11/2014 27.8  26.0 - 34.0 pg Final  . MCHC 02/11/2014 32.9  30.0 - 36.0 g/dL Final  . RDW 02/11/2014 13.3  11.5 - 15.5 % Final  . Platelets 02/11/2014 270  150 - 400 K/uL Final  . Sodium 02/11/2014 136* 137 - 147 mEq/L Final  .  Potassium 02/11/2014 4.0  3.7 - 5.3 mEq/L Final  . Chloride 02/11/2014 102  96 - 112 mEq/L Final  . CO2 02/11/2014 23  19 - 32 mEq/L Final  . Glucose, Bld 02/11/2014 137* 70 - 99 mg/dL Final  . BUN 02/11/2014 8  6 - 23 mg/dL Final  . Creatinine, Ser 02/11/2014 0.79  0.50 - 1.10 mg/dL Final  . Calcium 02/11/2014 8.6  8.4 - 10.5 mg/dL Final  . GFR calc non Af Amer 02/11/2014 >90  >90 mL/min Final  . GFR calc Af Amer 02/11/2014 >90  >90 mL/min Final   Comment: (NOTE)                          The eGFR has been calculated using the CKD EPI equation.                          This calculation has not been validated in all clinical situations.                          eGFR's persistently <90 mL/min signify possible Chronic Kidney                          Disease.  Georgiann Hahn gap 02/11/2014 11  5 - 15 Final  Hospital Outpatient Visit on 02/07/2014  Component  Date Value Ref Range Status  . aPTT 02/07/2014 31  24 - 37 seconds Final  . WBC 02/07/2014 8.8  4.0 - 10.5 K/uL Final  . RBC 02/07/2014 4.53  3.87 - 5.11 MIL/uL Final  . Hemoglobin 02/07/2014 12.6  12.0 - 15.0 g/dL Final  . HCT 02/07/2014 38.2  36.0 - 46.0 % Final  . MCV 02/07/2014 84.3  78.0 - 100.0 fL Final  . MCH 02/07/2014 27.8  26.0 - 34.0 pg Final  . MCHC 02/07/2014 33.0  30.0 - 36.0 g/dL Final  . RDW 02/07/2014 13.1  11.5 - 15.5 % Final  . Platelets 02/07/2014 304  150 - 400 K/uL Final  . Sodium 02/07/2014 136* 137 - 147 mEq/L Final  . Potassium 02/07/2014 3.9  3.7 - 5.3 mEq/L Final  . Chloride 02/07/2014 98  96 - 112 mEq/L Final  . CO2 02/07/2014 25  19 - 32 mEq/L Final  . Glucose, Bld 02/07/2014 92  70 - 99 mg/dL Final  . BUN 02/07/2014 15  6 - 23 mg/dL Final  . Creatinine, Ser 02/07/2014 0.81  0.50 - 1.10 mg/dL Final  . Calcium 02/07/2014 9.3  8.4 - 10.5 mg/dL Final  . Total Protein 02/07/2014 7.8  6.0 - 8.3 g/dL Final  . Albumin 02/07/2014 3.8  3.5 - 5.2 g/dL Final  . AST 02/07/2014 15  0 - 37 U/L Final  . ALT 02/07/2014 18   0 - 35 U/L Final  . Alkaline Phosphatase 02/07/2014 80  39 - 117 U/L Final  . Total Bilirubin 02/07/2014 <0.2* 0.3 - 1.2 mg/dL Final  . GFR calc non Af Amer 02/07/2014 88* >90 mL/min Final  . GFR calc Af Amer 02/07/2014 >90  >90 mL/min Final   Comment: (NOTE)                          The eGFR has been calculated using the CKD EPI equation.                          This calculation has not been validated in all clinical situations.                          eGFR's persistently <90 mL/min signify possible Chronic Kidney                          Disease.  . Anion gap 02/07/2014 13  5 - 15 Final  . Prothrombin Time 02/07/2014 13.5  11.6 - 15.2 seconds Final  . INR 02/07/2014 1.02  0.00 - 1.49 Final  . Color, Urine 02/07/2014 YELLOW  YELLOW Final  . APPearance 02/07/2014 CLEAR  CLEAR Final  . Specific Gravity, Urine 02/07/2014 1.011  1.005 - 1.030 Final  . pH 02/07/2014 6.5  5.0 - 8.0 Final  . Glucose, UA 02/07/2014 NEGATIVE  NEGATIVE mg/dL Final  . Hgb urine dipstick 02/07/2014 NEGATIVE  NEGATIVE Final  . Bilirubin Urine 02/07/2014 NEGATIVE  NEGATIVE Final  . Ketones, ur 02/07/2014 NEGATIVE  NEGATIVE mg/dL Final  . Protein, ur 02/07/2014 NEGATIVE  NEGATIVE mg/dL Final  . Urobilinogen, UA 02/07/2014 0.2  0.0 - 1.0 mg/dL Final  . Nitrite 02/07/2014 NEGATIVE  NEGATIVE Final  . Leukocytes, UA 02/07/2014 NEGATIVE  NEGATIVE Final   MICROSCOPIC NOT DONE ON URINES WITH NEGATIVE PROTEIN, BLOOD, LEUKOCYTES, NITRITE, OR GLUCOSE <1000 mg/dL.  Marland Kitchen MRSA,  PCR 02/07/2014 NEGATIVE  NEGATIVE Final  . Staphylococcus aureus 02/07/2014 NEGATIVE  NEGATIVE Final   Comment:                                 The Xpert SA Assay (FDA                          approved for NASAL specimens                          in patients over 39 years of age),                          is one component of                          a comprehensive surveillance                          program.  Test performance has                           been validated by [employer] for patients greater                          than or equal to 10 year old.                          It is not intended                          to diagnose infection nor to                          guide or monitor treatment.  . Transfuse no blood products 02/07/2014 TRANSFUSE NO BLOOD PRODUCTS, VERIFIED BY BEVERLY NANNEY,RN   Final     X-Rays:No results found.  EKG: Orders placed in visit on 03/08/12  . EKG 12-LEAD     Hospital Course: Ramla Hase is a 44 y.o. who was admitted to [facility]. They were brought to the operating room on 02/10/2014 and underwent Procedure(s): LEFT MEDIAL UNICOMPARTMENTAL KNEE ARTHROPLASTY.  Patient tolerated the procedure well and was later transferred to the recovery room and then to the orthopaedic floor for postoperative care.  They were given PO and IV analgesics for pain control following their surgery.  They were given 24 hours of postoperative antibiotics of  Anti-infectives   Start     Dose/Rate Route Frequency Ordered Stop   02/10/14 1600  ceFAZolin (ANCEF) IVPB 2 g/50 mL premix     2 g 100 mL/hr over 30 Minutes Intravenous Every 6 hours 02/10/14 1325 02/10/14 2132   02/10/14 0744  ceFAZolin (ANCEF) IVPB 2 g/50 mL premix     2 g 100 mL/hr over 30 Minutes Intravenous On call to O.R. 02/10/14 [id number] 02/10/14 1025     and started on DVT prophylaxis in the form of Xarelto.   PT and OT were ordered  for total joint protocol.  Discharge planning consulted to help with postop disposition and equipment needs.  Patient had a decent night on the evening of surgery.  They started to get up OOB with therapy on day one. Patient was seen in rounds by Dr. Wynelle Link on day one and was ready to go home following therapy that same day.  Discharge home with home health  Diet - Regular diet  Follow up - in 2 weeks  Activity - WBAT  Disposition - Home  Condition Upon Discharge - Good  D/C  Meds - See DC Summary  DVT Prophylaxis - Xarelto for two week and then a daily Aspirin   Discharge Instructions   Call MD / Call 911    Complete by:  As directed   If you experience chest pain or shortness of breath, CALL 911 and be transported to the hospital emergency room.  If you develope a fever above 101 F, pus (white drainage) or increased drainage or redness at the wound, or calf pain, call your surgeon's office.     Change dressing    Complete by:  As directed   Change dressing daily with sterile 4 x 4 inch gauze dressing and apply TED hose. Do not submerge the incision under water.     Constipation Prevention    Complete by:  As directed   Drink plenty of fluids.  Prune juice may be helpful.  You may use a stool softener, such as Colace (over the counter) 100 mg twice a day.  Use MiraLax (over the counter) for constipation as needed.     Diet general    Complete by:  As directed      Discharge instructions    Complete by:  As directed   Pick up stool softner and laxative for home. Do not submerge incision under water. May shower starting Thursday 02/13/2014 Continue to use ice for pain and swelling from surgery. May remove dressing tomorrow on Wednesday 02/12/2014 and then change daily with dry guaze and tape.  Take Xarelto for a total of two weeks, then discontinue Xarelto. Once the patient has completed the blood thinner regimen, then take a 325 mg Aspirin daily for three more weeks.     Do not put a pillow under the knee. Place it under the heel.    Complete by:  As directed      Do not sit on low chairs, stoools or toilet seats, as it may be difficult to get up from low surfaces    Complete by:  As directed      Driving restrictions    Complete by:  As directed   No driving until released by the physician.     Increase activity slowly as tolerated    Complete by:  As directed      Lifting restrictions    Complete by:  As directed   No lifting until released by the  physician.     Patient may shower    Complete by:  As directed   You may shower without a dressing once there is no drainage.  Do not wash over the wound.  If drainage remains, do not shower until drainage stops.     TED hose    Complete by:  As directed   Use stockings (TED hose) for 3 weeks on both leg(s).  You may remove them at night for sleeping.     Weight bearing as tolerated    Complete by:  As directed   Laterality:  left  Extremity:  Lower            Medication List    STOP taking these medications       aspirin-acetaminophen-caffeine 250-250-65 MG per tablet  Commonly known as:  EXCEDRIN MIGRAINE      TAKE these medications       clonazePAM 0.5 MG tablet  Commonly known as:  KLONOPIN  Take 0.25-0.5 mg by mouth 2 (two) times daily as needed for anxiety. Last dose per patient on 02/01/2014.     HYDROmorphone 2 MG tablet  Commonly known as:  DILAUDID  Take 1-2 tablets (2-4 mg total) by mouth every 4 (four) hours as needed for moderate pain or severe pain.     methocarbamol 500 MG tablet  Commonly known as:  ROBAXIN  Take 1 tablet (500 mg total) by mouth every 6 (six) hours as needed for muscle spasms.     pantoprazole 40 MG tablet  Commonly known as:  PROTONIX  Take 40 mg by mouth daily.     rivaroxaban 10 MG Tabs tablet  Commonly known as:  XARELTO  - Take 1 tablet (10 mg total) by mouth daily with breakfast. Take Xarelto for a total of two weeks, then discontinue Xarelto.  - Once the patient has completed the Xarelto, then take a 325 mg Aspirin daily.     traMADol 50 MG tablet  Commonly known as:  ULTRAM  Take 1-2 tablets (50-100 mg total) by mouth every 6 (six) hours as needed (mild pain).           Follow-up Information   Follow up with Gearlean Alf, MD. Schedule an appointment as soon as possible for a visit in 2 weeks. (Call office at [phone] to set up appointment for follow up.)    Specialty:  Orthopedic Surgery   Contact information:    [address] [place] [address] [phone]       Signed: [name], PA-C Orthopaedic Surgery 02/11/2014, 8:03 AM

## 2014-02-11 NOTE — Progress Notes (Signed)
Occupational Therapy Evaluation Patient Details Name: Patricia Bauer MRN: [id number] DOB: [birth year] Today's Date: 02/11/2014    History of Present Illness s/p L uniknee   Clinical Impression   Completed all education regarding compensatory techniques for ADL with use of DME and AE. Family present for session. Pt/family verbalized understanding. Pt limited by pain. Family will be able to D/C home once pain is controlled and able to navigate multiple stairs. No further OT needs. OT signing off.     Follow Up Recommendations  No OT follow up;Supervision - Intermittent    Equipment Recommendations  None recommended by OT    Recommendations for Other Services       Precautions / Restrictions Precautions Precautions: Knee Restrictions Weight Bearing Restrictions: No      Mobility Bed Mobility               General bed mobility comments: Pt OOB inchair  Transfers                 General transfer comment: Pt declined due to pain. Pt states she is moving "well".    Balance                                            ADL Overall ADL's : Needs assistance/impaired                                       General ADL Comments: Completed education regarding compensatory techniques for ADL and funcitonal mobility for ADL with use of AE and DME. Discussed alternatives for using 3 in 1 in tub due to size of tub.  Daughter who is going to be there 24/7 was present for education and verbalized understanding.      Vision                     Perception     Praxis      Pertinent Vitals/Pain Pain Assessment: 0-10 Pain Score: 5  Pain Location: left knee Pain Descriptors / Indicators: Constant Pain Intervention(s): Monitored during session;Premedicated before session     Hand Dominance     Extremity/Trunk Assessment Upper Extremity Assessment Upper Extremity Assessment: Defer to OT evaluation   Lower Extremity  Assessment Lower Extremity Assessment: Defer to PT evaluation   Cervical / Trunk Assessment Cervical / Trunk Assessment: Normal   Communication Communication Communication: No difficulties   Cognition Arousal/Alertness: Awake/alert Behavior During Therapy: WFL for tasks assessed/performed Overall Cognitive Status: Within Functional Limits for tasks assessed                     General Comments       Exercises       Shoulder Instructions      Home Living Family/patient expects to be discharged to:: Private residence Living Arrangements: Alone Available Help at Discharge: Family (for 1 wk) Type of Home: Apartment Home Access: Stairs to enter [employer] of Steps: 5, then 3, then a flight of stairs   Home Layout: One level     Bathroom Shower/Tub: Teacher, early years/pre: Standard Bathroom Accessibility: Yes How Accessible: Accessible via walker Home Equipment: Crutches;Cane - single point;Walker - 2 wheels;Bedside commode (RW and 3 in 1 being delivered per pt)  Prior Functioning/Environment Level of Independence: Independent;Independent with assistive device(s)            Maurie Boettcher, OTR/L  [phone] 02/13/2014 OT Diagnosis: Generalized weakness;Acute pain   OT Problem List: Pain   OT Treatment/Interventions:      OT Goals(Current goals can be found in the care plan section) Acute Rehab OT Goals Patient Stated Goal: to not be in pain  OT Frequency:     Barriers to D/C:            Co-evaluation              End of Session CPM Left Knee CPM Left Knee: Off Nurse Communication: Mobility status;Patient requests pain meds  Activity Tolerance: Patient limited by pain Patient left: in chair;with call bell/phone within reach   Time: 1200-1215 OT Time Calculation (min): 15 min Charges:  OT General Charges $OT Visit: 1 Procedure OT Evaluation $Initial OT Evaluation Tier I: 1 Procedure OT Treatments $Self  Care/Home Management : 8-22 mins G-Codes:    Patricia Bauer,Patricia Bauer 02/13/2014, 12:21 PM

## 2014-02-11 NOTE — Evaluation (Addendum)
Physical Therapy Evaluation Patient Details Name: Patricia Bauer MRN: [id number] DOB: 10-28-1970 Today's Date: 02/11/2014   History of Present Illness  s/p L uniknee  Clinical Impression  Pt will benefit from HHPT    Follow Up Recommendations Home health PT    Equipment Recommendations  Rolling walker with 5" wheels    Recommendations for Other Services       Precautions / Restrictions Precautions Precautions: Knee Required Braces or Orthoses: Knee Immobilizer - Left Knee Immobilizer - Left: Discontinue once straight leg raise with < 10 degree lag Restrictions Weight Bearing Restrictions: No      Mobility  Bed Mobility               General bed mobility comments: Pt OOB inchair  Transfers Overall transfer level: Needs assistance Equipment used: Rolling walker (2 wheeled) Transfers: Sit to/from Stand (x2) Sit to Stand: Supervision         General transfer comment: Pt declined due to pain. Pt states she is moving "well".  Ambulation/Gait Ambulation/Gait assistance: Supervision Ambulation Distance (Feet): 160 Feet Assistive device: Rolling walker (2 wheeled) Gait Pattern/deviations: Step-to pattern;Decreased step length - right;Decreased step length - left;Antalgic Gait velocity: extremely slow Gait velocity interpretation: Below normal speed for age/gender    Stairs Stairs: Yes Stairs assistance: Min guard Stair Management: One rail Right;Step to pattern;Forwards;With crutches Number of Stairs: 4 General stair comments: cues for sequence  Wheelchair Mobility    Modified Rankin (Stroke Patients Only)       Balance Overall balance assessment: No apparent balance deficits (not formally assessed)                                           Pertinent Vitals/Pain Pain Assessment: 0-10 Pain Score: 5  Pain Location: left knee Pain Descriptors / Indicators: Constant Pain Intervention(s): Monitored during session;Premedicated  before session    Home Living Family/patient expects to be discharged to:: Private residence Living Arrangements: Alone Available Help at Discharge: Family Type of Home: Apartment Home Access: Stairs to enter   Technical brewer of Steps: 2 "flights" Home Layout: One level Home Equipment: Crutches;Cane - single point      Prior Function Level of Independence: Independent;Independent with assistive device(s)               Hand Dominance        Extremity/Trunk Assessment   Upper Extremity Assessment: Defer to OT evaluation           Lower Extremity Assessment: Defer to PT evaluation   LLE Deficits / Details: unable to do SLR without assist; ankle WFL, knee flexion to 30*, very painful  Cervical / Trunk Assessment: Normal  Communication   Communication: No difficulties  Cognition Arousal/Alertness: Awake/alert Behavior During Therapy: WFL for tasks assessed/performed Overall Cognitive Status: Within Functional Limits for tasks assessed                      General Comments General comments (skin integrity, edema, etc.): reviewed ther ex  verbally and pt given handout; pt painful and fatigued but familiar with ex's from past knee surgeries    Exercises        Assessment/Plan decr ROM, incr pain   PT Assessment Patent does not need any further PT services;All further PT needs can be met in the next venue of care  PT Diagnosis Difficulty walking  PT Problem List    PT Treatment Interventions   gait training, ROM,  Stair training  PT Goals (Current goals can be found in the Care Plan section) Acute Rehab PT Goals Patient Stated Goal: to not be in pain PT Goal Formulation: pt will benefit from PT to address deficits noted below;    Frequency     Barriers to discharge        Co-evaluation               End of Session Equipment Utilized During Treatment: Gait belt Activity Tolerance: Patient tolerated treatment well Patient left:  in chair;with call bell/phone within reach;with family/visitor present Nurse Communication: Mobility status         Time: 8060-7895 PT Time Calculation (min): 39 min   Charges:   PT Evaluation $Initial PT Evaluation Tier I: 1 Procedure PT Treatments $Gait Training: 38-52 mins   PT G Codes:          Daliah Chaudoin 02-17-14, 1:55 PM

## 2014-02-11 NOTE — Progress Notes (Signed)
Utilization review completed.  

## 2014-02-11 NOTE — Progress Notes (Signed)
Patient states she is having trouble with pain control.  Pt has many steps to get into her home as well. Pt would like to stay another night for better control of pain and an additional PT session with stairs tmw. Will notify MD.

## 2014-02-11 NOTE — Care Management Note (Addendum)
Page 1 of 2   02/11/2014     1:36:22 PM CARE MANAGEMENT NOTE 02/11/2014  Patient:  [facility]   Account Number:  [id number]  Date Initiated:  02/11/2014  Documentation initiated by:  [facility]  Subjective/Objective Assessment:   adm: LEFT MEDIAL UNICOMPARTMENTAL KNEE ARTHROPLASTY (Left)     Action/Plan:   discharge planning  Worker's Comp   Anticipated DC Date:  02/11/2014   Anticipated DC Plan:  [place]  CM consult      [facility] Choice  NA   Choice offered to / List presented to:  NA   DME arranged  NA      DME agency  NA     [place] arranged  HH-2 PT      Status of service:  Completed, signed off Medicare Important Message given?   (If response is "NO", the following Medicare IM given date fields will be blank) Date Medicare IM given:   Medicare IM given by:   Date Additional Medicare IM given:   Additional Medicare IM given by:    Discharge Disposition:  [place]  Per UR Regulation:    If discussed at Long Length of Stay Meetings, dates discussed:    Comments:  02/11/14 CM received request from [place] rep, Izola Price to refax all which was faxed to [facility] rep Ivin Booty as whe cannot read what was sent to her by Ivin Booty.  CM faxed facesheet, orders, H&P, DC summary and OP note to [place] at [phone].  Izola Price states she will contract with [facility] DME for 3n1 and rolling walker so discharge won't be delayed.  CM spoke with [employer] rep) who has delivered DME to room.  No other CM needs were communicated.  Mariane Masters, BSN, [place].  02/11/14 CM 10:15 CM met with pt in room and notified her WC agent, Little Ishikawa, would have her rolling walker delivered to her room prior to discharge and her 3n1 would be delivered to her home.  CM called Little Ishikawa (NCM with WC) [phone] to arrange HHPT, RW, 3N1.  Ivin Booty requested I fax facesheet, orders, DC summary, H&P and OP note to [phone].   CM faxed and received verification from [facility] she received all info she needed. No other CM needs were communicated.  Mariane Masters, BSN, CM [phone].

## 2014-02-11 NOTE — Progress Notes (Signed)
02/11/14 1600  PT Visit Information  Last PT Received On 02/11/14  Assistance Needed +1  History of Present Illness s/p L uniknee  PT Time Calculation  PT Start Time 1542  PT Stop Time 1600  PT Time Calculation (min) 18 min  Subjective Data  Patient Stated Goal to not be in pain  Precautions  Precautions Knee  Required Braces or Orthoses Knee Immobilizer - Left  Knee Immobilizer - Left Discontinue once straight leg raise with < 10 degree lag  Pain Assessment  Pain Assessment 0-10  Pain Score 8  Pain Location left knee  Pain Descriptors / Indicators Constant  Pain Intervention(s) Limited activity within patient's tolerance;Monitored during session;Premedicated before session  Cognition  Arousal/Alertness Awake/alert  Behavior During Therapy WFL for tasks assessed/performed  Overall Cognitive Status Within Functional Limits for tasks assessed  Total Joint Exercises  Ankle Circles/Pumps AROM;Both;10 reps  Quad Sets Strengthening;Left;10 reps  Heel Slides AAROM;Left (7 reps)  Straight Leg Raises AAROM;Left (7 reps)  Goniometric ROM 30*; incr muscle guarding  PT - End of Session  Equipment Utilized During Treatment Gait belt  Activity Tolerance Patient limited by pain  Patient left in bed;with call bell/phone within reach;with family/visitor present  Nurse Communication Mobility status  PT - Assessment/Plan  PT Plan Current plan remains appropriate  PT Frequency 7X/week  Follow Up Recommendations Home health PT  PT equipment Rolling walker with 5" wheels  PT Goal Progression  Progress towards PT goals Progressing toward goals  Acute Rehab PT Goals  PT Goal Formulation With patient  PT General Charges  $$ ACUTE PT VISIT 1 Procedure  PT Treatments  $Therapeutic Exercise 8-22 mins

## 2014-02-11 NOTE — Progress Notes (Signed)
   Subjective: 1 Day Post-Op Procedure(s) (LRB): LEFT MEDIAL UNICOMPARTMENTAL KNEE ARTHROPLASTY (Left) Patient reports pain as mild.   Patient seen in rounds with Dr. Wynelle Link. Patient is well, and has had no acute complaints or problems Patient is ready to go home today  Objective: Vital signs in last 24 hours: Temp:  [97.6 F (36.4 C)-98.6 F (37 C)] 98.3 F (36.8 C) (10/27 0626) Pulse Rate:  [70-105] 72 (10/27 0626) Resp:  [9-31] 16 (10/27 0626) BP: (104-130)/(59-78) 104/61 mmHg (10/27 0626) SpO2:  [93 %-100 %] 93 % (10/27 0626) Weight:  [87.091 kg (192 lb)] 87.091 kg (192 lb) (10/26 0806)  Intake/Output from previous day:  Intake/Output Summary (Last 24 hours) at 02/11/14 0752 Last data filed at 02/11/14 9892  Gross per 24 hour  Intake   3905 ml  Output   3063 ml  Net    842 ml    Intake/Output this shift:    Labs:  Recent Labs  02/11/14 0440  HGB 10.5*    Recent Labs  02/11/14 0440  WBC 12.4*  RBC 3.78*  HCT 31.9*  PLT 270    Recent Labs  02/11/14 0440  NA 136*  K 4.0  CL 102  CO2 23  BUN 8  CREATININE 0.79  GLUCOSE 137*  CALCIUM 8.6   No results found for this basename: LABPT, INR,  in the last 72 hours  EXAM: General - Patient is Alert, Appropriate and Oriented Extremity - Neurovascular intact Sensation intact distally Dressing - clean, dry, no drainage Motor Function - intact, moving foot and toes well on exam.   Assessment/Plan: 1 Day Post-Op Procedure(s) (LRB): LEFT MEDIAL UNICOMPARTMENTAL KNEE ARTHROPLASTY (Left) Procedure(s) (LRB): LEFT MEDIAL UNICOMPARTMENTAL KNEE ARTHROPLASTY (Left) Past Medical History  Diagnosis Date  . Anemia   . Anxiety   . GERD (gastroesophageal reflux disease)   . History of hysterectomy     still has both ovaries  . History of migraine headaches   . PONV (postoperative nausea and vomiting)   . Depression   . Headache     migraines    Principal Problem:   OA (osteoarthritis) of  knee  Estimated body mass index is 31.95 kg/(m^2) as calculated from the following:   Height as of this encounter: 5\' 5"  (1.651 m).   Weight as of this encounter: 87.091 kg (192 lb). Up with therapy Discharge home with home health Diet - Regular diet Follow up - in 2 weeks Activity - WBAT Disposition - Home Condition Upon Discharge - Good D/C Meds - See DC Summary DVT Prophylaxis - Xarelto for two week and then a daily Aspirin  [name], PA-C Orthopaedic Surgery 02/11/2014, 7:52 AM

## 2014-02-12 LAB — CBC
HCT: 30.6 % — ABNORMAL LOW (ref 36.0–46.0)
Hemoglobin: 10 g/dL — ABNORMAL LOW (ref 12.0–15.0)
MCH: 27.6 pg (ref 26.0–34.0)
MCHC: 32.7 g/dL (ref 30.0–36.0)
MCV: 84.5 fL (ref 78.0–100.0)
Platelets: 231 10*3/uL (ref 150–400)
RBC: 3.62 MIL/uL — ABNORMAL LOW (ref 3.87–5.11)
RDW: 13.6 % (ref 11.5–15.5)
WBC: 9.9 10*3/uL (ref 4.0–10.5)

## 2014-02-12 LAB — BASIC METABOLIC PANEL
ANION GAP: 11 (ref 5–15)
BUN: 13 mg/dL (ref 6–23)
CO2: 25 mEq/L (ref 19–32)
Calcium: 8.2 mg/dL — ABNORMAL LOW (ref 8.4–10.5)
Chloride: 104 mEq/L (ref 96–112)
Creatinine, Ser: 0.9 mg/dL (ref 0.50–1.10)
GFR calc non Af Amer: 78 mL/min — ABNORMAL LOW (ref >90)
Glucose, Bld: 107 mg/dL — ABNORMAL HIGH (ref 70–99)
Potassium: 4.1 mEq/L (ref 3.7–5.3)
Sodium: 140 mEq/L (ref 137–147)

## 2014-02-12 NOTE — Progress Notes (Signed)
Physical Therapy Treatment Patient Details Name: Patricia Bauer MRN: [id number] DOB: 1971/02/20 Today's Date: 02/12/2014    History of Present Illness      PT Comments    Patient is progressing toward goals but continues to require significantly increased time with all mobility and exercises due to pain.  Pain before and during tx rated as 8/10 and rated as 7/10 post tx.  Patient was successful with stair training using one crutch and a handrail.  All exercises required AAROM.    Follow Up Recommendations  Home health PT     Equipment Recommendations  Rolling walker with 5" wheels    Recommendations for Other Services       Precautions / Restrictions Precautions Precautions: Fall Required Braces or Orthoses: Knee Immobilizer - Left Knee Immobilizer - Left: On when out of bed or walking    Mobility  Bed Mobility Overal bed mobility: Modified Independent             General bed mobility comments: pt requires significantly increased time; VC's for sequencing; pt used gait belt to A LLE to EOB  Transfers Overall transfer level: Modified independent Equipment used: Rolling walker (2 wheeled) Transfers: Sit to/from Stand Sit to Stand: Modified independent (Device/Increase time)         General transfer comment: pt requires increased time; VC's for sequencing and foot/hand placement  Ambulation/Gait Ambulation/Gait assistance: Min guard Ambulation Distance (Feet): 20 Feet Assistive device: Rolling walker (2 wheeled) Gait Pattern/deviations: Step-to pattern;Decreased step length - left;Decreased stance time - left   Gait velocity interpretation: <1.8 ft/sec, indicative of risk for recurrent falls General Gait Details: pt requires significantly increased time due to c/o pain; VC's for sequencing and RW management   Stairs Stairs: Yes Stairs assistance: Min guard Stair Management: One rail Right;Step to pattern;Forwards;With crutches Number of Stairs:  3 General stair comments: pt requires significantly increased time; VC's for crutch management and sequencing.    Wheelchair Mobility    Modified Rankin (Stroke Patients Only)       Balance                                    Cognition                            Exercises Total Joint Exercises Ankle Circles/Pumps: AROM;Both;10 reps Quad Sets: Strengthening;Left;10 reps Heel Slides: AAROM;Left Straight Leg Raises: AAROM;Left;10 reps    General Comments        Pertinent Vitals/Pain Pain Assessment: 0-10 Pain Score: 8  Pain Location: L knee at incision Pain Intervention(s): Monitored during session;Ice applied    Home Living                      Prior Function            PT Goals (current goals can now be found in the care plan section) Acute Rehab PT Goals PT Goal Formulation: With patient Progress towards PT goals: Progressing toward goals    Frequency  7X/week    PT Plan Current plan remains appropriate    Co-evaluation             End of Session Equipment Utilized During Treatment: Gait belt Activity Tolerance: Patient limited by pain Patient left: in chair;with call bell/phone within reach     Time:  - 5102-5852  Charges:  $Gait Training: 23-37 mins $Therapeutic Exercise: 8-22 mins                    G Codes:       Have reviewed and agree with above; Kenyon Ana, PT Pager: [phone] 02/12/2014  Miller,Derrick SPTA 02/12/2014, 10:25 AM

## 2014-02-12 NOTE — Progress Notes (Signed)
   Subjective: 2 Days Post-Op Procedure(s) (LRB): LEFT MEDIAL UNICOMPARTMENTAL KNEE ARTHROPLASTY (Left) Patient reported pain as moderate and severe yesterday and unable to go home.  Better today and will allow home today. Patient seen in rounds for Dr. Wynelle Link. Patient is well, but has had some minor complaints of pain in the knee, requiring pain medications Patient is ready to go home today.  Objective: Vital signs in last 24 hours: Temp:  [98.3 F (36.8 C)-98.8 F (37.1 C)] 98.7 F (37.1 C) (10/28 0500) Pulse Rate:  [57-80] 80 (10/28 0500) Resp:  [16-18] 18 (10/28 0500) BP: (110-128)/(69-72) 126/72 mmHg (10/28 0500) SpO2:  [95 %-98 %] 95 % (10/28 0500)  Intake/Output from previous day:  Intake/Output Summary (Last 24 hours) at 02/12/14 0816 Last data filed at 02/11/14 2200  Gross per 24 hour  Intake    580 ml  Output   1100 ml  Net   -520 ml    Labs:  Recent Labs  02/11/14 0440 02/12/14 0510  HGB 10.5* 10.0*    Recent Labs  02/11/14 0440 02/12/14 0510  WBC 12.4* 9.9  RBC 3.78* 3.62*  HCT 31.9* 30.6*  PLT 270 231    Recent Labs  02/11/14 0440 02/12/14 0510  NA 136* 140  K 4.0 4.1  CL 102 104  CO2 23 25  BUN 8 13  CREATININE 0.79 0.90  GLUCOSE 137* 107*  CALCIUM 8.6 8.2*   No results found for this basename: LABPT, INR,  in the last 72 hours  EXAM: General - Patient is Alert, Appropriate and Oriented Extremity - Neurovascular intact Sensation intact distally Dorsiflexion/Plantar flexion intact Incision - clean, dry, no drainage Motor Function - intact, moving foot and toes well on exam.   Assessment/Plan: 2 Days Post-Op Procedure(s) (LRB): LEFT MEDIAL UNICOMPARTMENTAL KNEE ARTHROPLASTY (Left) Procedure(s) (LRB): LEFT MEDIAL UNICOMPARTMENTAL KNEE ARTHROPLASTY (Left) Past Medical History  Diagnosis Date  . Anemia   . Anxiety   . GERD (gastroesophageal reflux disease)   . History of hysterectomy     still has both ovaries  . History of  migraine headaches   . PONV (postoperative nausea and vomiting)   . Depression   . Headache     migraines    Principal Problem:   OA (osteoarthritis) of knee  Estimated body mass index is 31.95 kg/(m^2) as calculated from the following:   Height as of this encounter: 5\' 5"  (1.651 m).   Weight as of this encounter: 87.091 kg (192 lb). Up with therapy Discharge home with home health Diet - Regular diet Follow up - in 2 weeks Activity - WBAT Disposition - Home Condition Upon Discharge - improving D/C Meds - See DC Summary DVT Prophylaxis - Xarelto for two weeks and then a daily Aspirin  [name], PA-C Orthopaedic Surgery 02/12/2014, 8:16 AM

## 2014-02-13 NOTE — Progress Notes (Signed)
Discharge summary sent to payer through [facility]  

## 2014-02-17 ENCOUNTER — Encounter (HOSPITAL_COMMUNITY): Payer: Self-pay | Admitting: Orthopedic Surgery

## 2014-03-04 ENCOUNTER — Ambulatory Visit (INDEPENDENT_AMBULATORY_CARE_PROVIDER_SITE_OTHER): Payer: 59 | Admitting: Family Medicine

## 2014-03-04 ENCOUNTER — Encounter: Payer: Self-pay | Admitting: Family Medicine

## 2014-03-04 VITALS — BP 124/82 | HR 67 | Temp 97.6°F | Ht 64.5 in | Wt 189.2 lb

## 2014-03-04 DIAGNOSIS — S8992XA Unspecified injury of left lower leg, initial encounter: Secondary | ICD-10-CM | POA: Insufficient documentation

## 2014-03-04 DIAGNOSIS — R109 Unspecified abdominal pain: Secondary | ICD-10-CM

## 2014-03-04 DIAGNOSIS — S8992XS Unspecified injury of left lower leg, sequela: Secondary | ICD-10-CM

## 2014-03-04 DIAGNOSIS — G43809 Other migraine, not intractable, without status migrainosus: Secondary | ICD-10-CM

## 2014-03-04 NOTE — Assessment & Plan Note (Signed)
Poorly controlled. Did discuss possibly starting Elavil for prophylaxis. She is aware of her triggers. She wants to call me after her knee rehab to discuss further.

## 2014-03-04 NOTE — Progress Notes (Signed)
Subjective:   [name], female    DOB: 11/12/1970, 43 y.o.   MRN: [id number]  Patricia Bauer is a pleasant 43 y.o. year old female who presents to clinic today with Establish Care and Abdominal Pain  on 03/04/2014  HPI: Abdominal pain- remote history of hysterectomy- Dr. Georga Bora.   Was told that she "had everything but uterine cancer." She does still have a cervix and ovaries. States abdominal pain feels like menstrual cramping- usually occurs once a month.  No associated nausea, vomiting, diarrhea, blood in stool or black stools. Does not take anything for it as it usually resolves on its own.  Left knee injury- work related injury 4 years ago.  Initially managed with conservative therapy but just had partial knee replacement by Dr. Elmyra Ricks since she failed conservative tx. She is currently in PT.  Migraine headaches- associated with nausea, vomiting and photophobia. Gets at least 2-4 per week.  Has been on narcotics for past several weeks so not having any currently. Per pt, could not tolerate topamax and beta blockers were ineffective.  Was followed by Dr. Domingo Cocking but could not afford copay and his recommended treatments.  Has tried multiple triptans for abortive therapy.  Current Outpatient Prescriptions on File Prior to Visit  Medication Sig Dispense Refill  . clonazePAM (KLONOPIN) 0.5 MG tablet Take 0.25-0.5 mg by mouth 2 (two) times daily as needed for anxiety. Last dose per patient on 02/01/2014.    . methocarbamol (ROBAXIN) 500 MG tablet Take 1 tablet (500 mg total) by mouth every 6 (six) hours as needed for muscle spasms. 80 tablet 0  . pantoprazole (PROTONIX) 40 MG tablet Take 40 mg by mouth daily.    . traMADol (ULTRAM) 50 MG tablet Take 1-2 tablets (50-100 mg total) by mouth every 6 (six) hours as needed (mild pain). 60 tablet 1   No current facility-administered medications on file prior to visit.    Allergies  Allergen Reactions  .  Topamax [Topiramate]     Body aches   . Hydrocodone Anxiety and Palpitations    Past Medical History  Diagnosis Date  . Anemia   . Anxiety   . GERD (gastroesophageal reflux disease)   . History of hysterectomy     still has both ovaries  . History of migraine headaches   . PONV (postoperative nausea and vomiting)   . Depression   . Headache     migraines     Past Surgical History  Procedure Laterality Date  . Knee athroscopy  5/12    LT  . Laparoscopic vaginal hysterectomy  5/11    Supra cervical  . Wisdom tooth extraction  1995  . Knee arthroscopy    . Abdominal hysterectomy    . Colonoscopy    . Upper gi endoscopy    . Partial knee arthroplasty Left 02/10/2014    Procedure: LEFT MEDIAL UNICOMPARTMENTAL KNEE ARTHROPLASTY;  Surgeon: Gearlean Alf, MD;  Location: [facility];  Service: Orthopedics;  Laterality: Left;    Family History  Problem Relation Age of Onset  . Diabetes Father   . Stroke Father   . Heart attack Father   . Hyperlipidemia Mother   . Hypertension Mother     History   Social History  . Marital Status: Divorced    Spouse Name: N/A    Number of Children: N/A  . Years of Education: N/A   Occupational History  . Not on file.   Social History Main Topics  .  Smoking status: Never Smoker   . Smokeless tobacco: Never Used  . Alcohol Use: Yes     Comment: occassional  . Drug Use: No  . Sexual Activity: No   Other Topics Concern  . Not on file   Social History Narrative   The PMH, PSH, Social History, Family History, Medications, and allergies have been reviewed in [facility], and have been updated if relevant.    Review of Systems  Constitutional: Negative for fever and fatigue.  HENT: Negative.   Eyes: Negative.   Respiratory: Negative.   Cardiovascular: Negative.   Gastrointestinal: Positive for abdominal pain. Negative for nausea, vomiting, diarrhea, constipation, blood in stool, abdominal distention and anal bleeding.  Genitourinary:  Negative.   Musculoskeletal: Positive for joint swelling.  Hematological: Negative.   Psychiatric/Behavioral: Negative.   All other systems reviewed and are negative.      Objective:    BP 124/82 mmHg  Pulse 67  Temp(Src) 97.6 F (36.4 C) (Oral)  Ht 5' 4.5" (1.638 m)  Wt 189 lb 4 oz (85.843 kg)  BMI 31.99 kg/m2  SpO2 96%  LMP 09/07/2009   Physical Exam  Constitutional: She is oriented to person, place, and time. She appears well-developed and well-nourished. No distress.  HENT:  Head: Normocephalic.  Neck: Normal range of motion.  Abdominal: Soft. Bowel sounds are normal. She exhibits no distension and no mass. There is no tenderness. There is no rebound and no guarding.  Neurological: She is alert and oriented to person, place, and time.  Walking with crutches  Psychiatric: She has a normal mood and affect. Her behavior is normal. Thought content normal.  Nursing note and vitals reviewed.         Assessment & Plan:   Other type of migraine without status migrainosus  Left knee injury, sequela  Intermittent abdominal pain No Follow-up on file.

## 2014-03-04 NOTE — Patient Instructions (Signed)
It was great to meet you. Please call me after you knee rehab- we can further discuss migraine prophylaxis medication. Perhaps we could try Elavil.  Please drop off your labs from work.

## 2014-03-04 NOTE — Progress Notes (Signed)
Pre visit review using our clinic review tool, if applicable. No additional management support is needed unless otherwise documented below in the visit note. 

## 2014-03-04 NOTE — Assessment & Plan Note (Signed)
S/p recent partial knee replacement (Allusio). Continue PT. He is managing her narcotics.

## 2014-03-04 NOTE — Assessment & Plan Note (Signed)
Intermittent without red flag symptoms. Since it is monthly- ? Endometriosis. Advised to discuss with her GYN at upcoming appt.  If persists, we should image her abd/pelvis. The patient indicates understanding of these issues and agrees with the plan.

## 2014-03-24 ENCOUNTER — Ambulatory Visit (INDEPENDENT_AMBULATORY_CARE_PROVIDER_SITE_OTHER): Payer: 59 | Admitting: Family Medicine

## 2014-03-24 ENCOUNTER — Encounter: Payer: Self-pay | Admitting: Family Medicine

## 2014-03-24 VITALS — BP 114/66 | HR 75 | Temp 98.2°F | Wt 183.8 lb

## 2014-03-24 DIAGNOSIS — K219 Gastro-esophageal reflux disease without esophagitis: Secondary | ICD-10-CM

## 2014-03-24 DIAGNOSIS — R110 Nausea: Secondary | ICD-10-CM

## 2014-03-24 MED ORDER — METOCLOPRAMIDE HCL 5 MG PO TABS: 5.0000 mg | ORAL_TABLET | Freq: Four times a day (QID) | ORAL | Status: DC | PRN

## 2014-03-24 NOTE — Patient Instructions (Addendum)
Great to see you. Let's try adding pepcid 20 mg daily- ok to stop if no improvement within two weeks.  Try Reglan as needed for nausea.  Stop by to see Rosaria Ferries on your way out- she will set up your ultrasound.  I will call you with your results.

## 2014-03-24 NOTE — Progress Notes (Signed)
   Subjective:   Patient ID: Tashena Ibach, female    DOB: 07/26/1970, 43 y.o.   MRN: [id number]  Jamilet Ambroise is a pleasant 43 y.o. year old female who presents to clinic today with Nausea  on 03/24/2014  HPI:  Prolonged nausea- nausea started a week or two after knee surgery on 02/10/14.  No vomiting but nauseated all day.  No diarrhea.  No fever.  Does have a h/o GERD- has been feeling symptoms worse.  Taking protonix.  Surgeon felt nausea due to narcotics but has stopped taking them over 3 weeks ago and remains nauseated. Has noticed intermittent abdominal pain. Not taking any NSAIDs.  Has noticed any foods worsening or triggering symptoms. Has lost 6 pounds in last 3 weeks due to decreased appetite.  No black stools.  Phenergan and zofran are not effective.  Wt Readings from Last 3 Encounters:  03/24/14 183 lb 12 oz (83.348 kg)  03/04/14 189 lb 4 oz (85.843 kg)  02/10/14 192 lb (87.091 kg)   Lab Results  Component Value Date   ALT 18 02/07/2014   AST 15 02/07/2014   ALKPHOS 80 02/07/2014   BILITOT <0.2* 02/07/2014   Lab Results  Component Value Date   NA 140 02/12/2014   K 4.1 02/12/2014   CL 104 02/12/2014   CO2 25 02/12/2014   Lab Results  Component Value Date   CREATININE 0.90 02/12/2014     Review of Systems  Unable to perform ROS Constitutional: Negative for fever and appetite change.  Gastrointestinal: Positive for nausea and abdominal pain. Negative for vomiting, constipation, blood in stool, abdominal distention, anal bleeding and rectal pain.  Genitourinary: Negative.   Psychiatric/Behavioral: Negative.   All other systems reviewed and are negative.      Objective:    BP 114/66 mmHg  Pulse 75  Temp(Src) 98.2 F (36.8 C) (Oral)  Wt 183 lb 12 oz (83.348 kg)  SpO2 98%  LMP 09/07/2009   Physical Exam  Constitutional: She is oriented to person, place, and time. She appears well-developed and well-nourished. No distress.  Appears  fatigued  HENT:  Head: Normocephalic.  Cardiovascular: Normal rate.   Pulmonary/Chest: Effort normal.  Abdominal: Soft. Bowel sounds are normal. She exhibits no distension and no mass. There is no tenderness. There is no rebound and no guarding.  Neurological: She is alert and oriented to person, place, and time.  Skin: Skin is warm.  Psychiatric: She has a normal mood and affect. Her behavior is normal. Judgment and thought content normal.  Nursing note and vitals reviewed.         Assessment & Plan:   No diagnosis found. No Follow-up on file.

## 2014-03-24 NOTE — Assessment & Plan Note (Signed)
Persistent despite stopping narcoctics. ?worsening GERD causing symptoms and not eating as much which is likely worsening her GERD symptoms- vicious cycle. On protonix- will add H2 blocker- pepcid 20 mg daily. Will also check labs and [place] of abdomen for further work up.  Orders Placed This Encounter  Procedures  . US Abdomen Complete  . Lipase  . Comprehensive metabolic panel  . H. pylori antibody, IgA  . CBC    Sx refractory- phenergan and zofran ineffective. Will give a trial of reglan- erRx sent.

## 2014-03-24 NOTE — Progress Notes (Signed)
Pre visit review using our clinic review tool, if applicable. No additional management support is needed unless otherwise documented below in the visit note. 

## 2014-03-26 ENCOUNTER — Encounter: Payer: Self-pay | Admitting: Family Medicine

## 2014-03-26 ENCOUNTER — Ambulatory Visit: Payer: Self-pay | Admitting: Family Medicine

## 2014-03-26 LAB — COMPREHENSIVE METABOLIC PANEL
ALBUMIN: 4.2 g/dL (ref 3.5–5.5)
ALT: 14 IU/L (ref 0–32)
AST: 19 IU/L (ref 0–40)
Albumin/Globulin Ratio: 1.4 (ref 1.1–2.5)
Alkaline Phosphatase: 74 IU/L (ref 39–117)
BUN/Creatinine Ratio: 10 (ref 9–23)
BUN: 9 mg/dL (ref 6–24)
CO2: 22 mmol/L (ref 18–29)
Calcium: 9.2 mg/dL (ref 8.7–10.2)
Chloride: 101 mmol/L (ref 97–108)
Creatinine, Ser: 0.88 mg/dL (ref 0.57–1.00)
GFR calc non Af Amer: 81 mL/min/{1.73_m2} (ref >59)
GFR, EST AFRICAN AMERICAN: 94 mL/min/{1.73_m2} (ref >59)
GLUCOSE: 99 mg/dL (ref 65–99)
Globulin, Total: 2.9 g/dL (ref 1.5–4.5)
Potassium: 4.3 mmol/L (ref 3.5–5.2)
Sodium: 141 mmol/L (ref 134–144)
TOTAL PROTEIN: 7.1 g/dL (ref 6.0–8.5)
Total Bilirubin: <0.2 mg/dL (ref 0.0–1.2)

## 2014-03-26 LAB — CBC
HEMATOCRIT: 38.4 % (ref 34.0–46.6)
HEMOGLOBIN: 12.4 g/dL (ref 11.1–15.9)
MCH: 27.4 pg (ref 26.6–33.0)
MCHC: 32.3 g/dL (ref 31.5–35.7)
MCV: 85 fL (ref 79–97)
Platelets: 313 10*3/uL (ref 150–379)
RBC: 4.53 x10E6/uL (ref 3.77–5.28)
RDW: 14.2 % (ref 12.3–15.4)
WBC: 5.6 10*3/uL (ref 3.4–10.8)

## 2014-03-26 LAB — LIPASE: Lipase: 41 U/L (ref 0–59)

## 2014-03-26 LAB — H. PYLORI ANTIBODY, IGA

## 2014-03-27 ENCOUNTER — Telehealth: Payer: Self-pay

## 2014-03-27 ENCOUNTER — Other Ambulatory Visit: Payer: Self-pay | Admitting: Family Medicine

## 2014-03-27 DIAGNOSIS — R110 Nausea: Secondary | ICD-10-CM

## 2014-03-27 NOTE — Telephone Encounter (Signed)
Pt left v/m; pt received test results from [place] and pt wants to know if can stop taking Reglan;Reglan not working well and should pt continue taking famotidine with the pantoprazole. Pt request cb.

## 2014-03-27 NOTE — Telephone Encounter (Signed)
Yes I would stop the reglan but give the acid reducers/blockers- famotidine and pantoprazole a little more time.  I placed GI referral already and hopefully she will hear something soon.

## 2014-03-27 NOTE — Telephone Encounter (Signed)
Spoke to pt and advised per Dr Aron; pt verbally expressed understanding.  

## 2014-04-02 ENCOUNTER — Encounter: Payer: Self-pay | Admitting: Physician Assistant

## 2014-04-02 ENCOUNTER — Ambulatory Visit (INDEPENDENT_AMBULATORY_CARE_PROVIDER_SITE_OTHER): Payer: 59 | Admitting: Physician Assistant

## 2014-04-02 VITALS — BP 134/78 | HR 76 | Ht 64.5 in | Wt 184.4 lb

## 2014-04-02 DIAGNOSIS — R110 Nausea: Secondary | ICD-10-CM

## 2014-04-02 DIAGNOSIS — K760 Fatty (change of) liver, not elsewhere classified: Secondary | ICD-10-CM

## 2014-04-02 DIAGNOSIS — R1013 Epigastric pain: Secondary | ICD-10-CM

## 2014-04-02 NOTE — Progress Notes (Signed)
Case reviewed with physician assistant. Agree with initial assessment and plans as outlined 

## 2014-04-02 NOTE — Progress Notes (Signed)
[name], female   DOB: 07/15/1970, 43 y.o.   MRN: [id number]    HPI:   Patricia Bauer is a 43 year old female referred for evaluation by Dr. Deborra Medina due to nausea.  Patricia Bauer underwent a left knee partial arthroplasty on 02/10/2014 at [facility]. Approximately 2 weeks post surgery, she began to develop nausea initially she thought her nausea was due to her pain medications, so she discontinued her pain meds. She continues to be nauseous. She states her nausea is fairly constant and is not affected by food, though it is worse in the morning and somewhat less at night she has no vomiting she has only been able to eat breads, crackers, and soups, she has no heartburn, but when she belches she will get food particles coming up she has no dysphagia. She feels full all the time. She has lost 6 pounds in the past 3 weeks. She has had no melena or bright red blood per rectum. She has tried Zofran and Phenergan with minimal relief. She is currently using pantoprazole and famotidine.  She reports that she had an EGD 12-13 years ago in [place] as part of the anemia workup. She was told her upper endoscopy was normal at the same time she had a colonoscopy and was found to have a small polyp she had a surveillance colonoscopy 3 or 4 years ago though she doesn't remember in what city, facility, or who did provider was. She says she had no recurrent polyps. Her initial colonoscopy was done as part of an anemia workup as well. She states she had a hysterectomy and her anemia resolved.   Past Medical History  Diagnosis Date  . Anemia   . Anxiety   . GERD (gastroesophageal reflux disease)   . History of hysterectomy     still has both ovaries  . History of migraine headaches   . PONV (postoperative nausea and vomiting)   . Depression   . Headache     migraines   . Colon polyp     Past Surgical History  Procedure Laterality Date  . Knee athroscopy  5/12    LT  . Laparoscopic vaginal  hysterectomy  5/11    Supra cervical  . Wisdom tooth extraction  1995  . Knee arthroscopy    . Abdominal hysterectomy    . Colonoscopy    . Upper gi endoscopy    . Partial knee arthroplasty Left 02/10/2014    Procedure: LEFT MEDIAL UNICOMPARTMENTAL KNEE ARTHROPLASTY;  Surgeon: Gearlean Alf, MD;  Location: [facility];  Service: Orthopedics;  Laterality: Left;   Family History  Problem Relation Age of Onset  . Diabetes Father   . Stroke Father   . Heart attack Father   . Hyperlipidemia Mother   . Hypertension Mother   . Colon cancer Neg Hx   . Colon polyps Neg Hx   . Kidney disease Neg Hx   . Gallbladder disease Neg Hx   . Esophageal cancer Neg Hx    History  Substance Use Topics  . Smoking status: Never Smoker   . Smokeless tobacco: Never Used  . Alcohol Use: Yes     Comment: occassional   Current Outpatient Prescriptions  Medication Sig Dispense Refill  . Famotidine (ACID CONTROLLER PO) Take 1 tablet by mouth daily. [employer], Maximum Strength    . pantoprazole (PROTONIX) 40 MG tablet Take 40 mg by mouth daily.    . promethazine (PHENERGAN) 25 MG tablet Take 25 mg by  mouth every 6 (six) hours as needed for nausea or vomiting.    . traMADol (ULTRAM) 50 MG tablet Take 1-2 tablets (50-100 mg total) by mouth every 6 (six) hours as needed (mild pain). 60 tablet 1   No current facility-administered medications for this visit.   Allergies  Allergen Reactions  . Topamax [Topiramate]     Body aches   . Hydrocodone Anxiety and Palpitations     Review of Systems: Gen: Denies any fever, chills, sweats, anorexia, fatigue, weakness, malaise, weight loss, and sleep disorder CV: Denies chest pain, angina, palpitations, syncope, orthopnea, PND, peripheral edema, and claudication. Resp: Denies dyspnea at rest, dyspnea with exercise, cough, sputum, wheezing, coughing up blood, and pleurisy. GI: Denies vomiting blood, jaundice, and fecal incontinence.   Denies dysphagia or  odynophagia. GU : Denies urinary burning, blood in urine, urinary frequency, urinary hesitancy, nocturnal urination, and urinary incontinence. MS: Denies joint pain, limitation of movement, and swelling, stiffness, low back pain, extremity pain. Denies muscle weakness, cramps, atrophy.  Derm: Denies rash, itching, dry skin, hives, moles, warts, or unhealing ulcers.  Psych: Denies depression, anxiety, memory loss, suicidal ideation, hallucinations, paranoia, and confusion. Heme: Denies bruising, bleeding, and enlarged lymph nodes. Neuro:  Denies any headaches, dizziness, paresthesias. Endo:  Denies any problems with DM, thyroid, adrenal function  Studies: Abdominal ultrasound done on 03/26/2014 at [facility] showed #1. Gallbladder normal. Mild prominence of the common bile duct is 7.5 mm. #2 fatty infiltration of the liver. #3 simple cyst in the right upper renal pole.  LAB RESULTS: Blood work on 03/24/2014 lipase 41, total bili less than 0.2, alkaline phosphatase 74, AST 19, ALT 14. H. pylori antibody negative. CBC on 03/24/2014 had white blood cell count 5.6, hemoglobin 12.4, hematocrit 38.4, platelets 313,000.  Prior Endoscopies:   See history of present illness  Physical Exam: BP 134/78 mmHg  Pulse 76  Ht 5' 4.5" (1.638 m)  Wt 184 lb 6 oz (83.632 kg)  BMI 31.17 kg/m2  LMP 09/07/2009 Constitutional: Pleasant,well-developed female in no acute distress. HEENT: Normocephalic and atraumatic. Conjunctivae are normal. No scleral icterus. Neck supple.  Cardiovascular: Normal rate, regular rhythm.  Pulmonary/chest: Effort normal and breath sounds normal. No wheezing, rales or rhonchi. Abdominal: Soft, nondistended, nontender. Bowel sounds active throughout. There are no masses palpable. No hepatomegaly. Extremities: no edema Lymphadenopathy: No cervical adenopathy noted. Neurological: Alert and oriented to person place and time. Skin: Skin is warm and dry. No  rashes noted. Psychiatric: Normal mood and affect. Behavior is normal.  ASSESSMENT AND PLAN: 43 year old female with a 2 month history of nausea referred for evaluation. Her symptoms are suggestive of possible gastritis or ulcer and she will be scheduled for an EGD to evaluate for the above.The risks, benefits, and alternatives to endoscopy with possible biopsy and possible dilation were discussed with the patient and they consent to proceed. The procedure will be scheduled with Dr.Perry. In the meantime, she has been advised to adhere to an antireflux regimen and to continue her pantoprazole and famotidine. She has been instructed to try using her Zofran 8 mg every 8 hours as needed instead of 4 mg to see if that provides more relief. With regards to her ultrasound finding of fatty liver, she's been advised to watch her weight, limit alcohol intake and to follow her glucose, triglycerides, and cholesterol with her primary care provider. Her LFTs are normal and her common bile duct is still relatively within normal limits. Further recommendations will be made  pending the findings of her EGD.    Olita Takeshita, Vita Barley PA-C 04/02/2014, 11:47 AM

## 2014-04-02 NOTE — Patient Instructions (Signed)
You have been scheduled for an endoscopy. Please follow written instructions given to you at your visit today. If you use inhalers (even only as needed), please bring them with you on the day of your procedure. Your physician has requested that you go to www.startemmi.com and enter the access code given to you at your visit today. This web site gives a general overview about your procedure. However, you should still follow specific instructions given to you by our office regarding your preparation for the procedure.  Use your Zofran 8mg  every 8 hours as needed

## 2014-04-03 ENCOUNTER — Ambulatory Visit (AMBULATORY_SURGERY_CENTER): Payer: 59 | Admitting: Internal Medicine

## 2014-04-03 ENCOUNTER — Encounter: Payer: Self-pay | Admitting: Internal Medicine

## 2014-04-03 VITALS — BP 122/77 | HR 58 | Temp 98.3°F | Resp 15 | Ht 64.0 in | Wt 184.0 lb

## 2014-04-03 DIAGNOSIS — R110 Nausea: Secondary | ICD-10-CM

## 2014-04-03 DIAGNOSIS — R1013 Epigastric pain: Secondary | ICD-10-CM

## 2014-04-03 DIAGNOSIS — K253 Acute gastric ulcer without hemorrhage or perforation: Secondary | ICD-10-CM

## 2014-04-03 MED ORDER — SODIUM CHLORIDE 0.9 % IV SOLN: 500.0000 mL | INTRAVENOUS | Status: DC

## 2014-04-03 NOTE — Progress Notes (Signed)
  [facility] Post-op Note  Patient: Patricia Bauer  Procedure(s) Performed: endoscopy  Patient Location: [facility] - Recovery Area  Anesthesia Type: Deep Sedation/Propofol  Level of Consciousness: awake, oriented and patient cooperative  Airway and Oxygen Therapy: Patient Spontanous Breathing  Post-op Pain: none  Post-op Assessment:  Post-op Vital signs reviewed, Patient's Cardiovascular Status Stable, Respiratory Function Stable, Patent Airway, No signs of Nausea or vomiting and Pain level controlled  Post-op Vital Signs: Reviewed and stable  Complications: No apparent anesthesia complications  [place] E 2:31 PM

## 2014-04-03 NOTE — Patient Instructions (Signed)
YOU HAD AN ENDOSCOPIC PROCEDURE TODAY AT [facility]: Refer to the procedure report that was given to you for any specific questions about what was found during the examination.  If the procedure report does not answer your questions, please call your gastroenterologist to clarify.  If you requested that your care partner not be given the details of your procedure findings, then the procedure report has been included in a sealed envelope for you to review at your convenience later.  YOU SHOULD EXPECT: Some feelings of bloating in the abdomen. Passage of more gas than usual.  Walking can help get rid of the air that was put into your GI tract during the procedure and reduce the bloating. If you had a lower endoscopy (such as a colonoscopy or flexible sigmoidoscopy) you may notice spotting of blood in your stool or on the toilet paper. If you underwent a bowel prep for your procedure, then you may not have a normal bowel movement for a few days.  DIET: Your first meal following the procedure should be a light meal and then it is ok to progress to your normal diet.  A half-sandwich or bowl of soup is an example of a good first meal.  Heavy or fried foods are harder to digest and may make you feel nauseous or bloated.  Likewise meals heavy in dairy and vegetables can cause extra gas to form and this can also increase the bloating.  Drink plenty of fluids but you should avoid alcoholic beverages for 24 hours.  ACTIVITY: Your care partner should take you home directly after the procedure.  You should plan to take it easy, moving slowly for the rest of the day.  You can resume normal activity the day after the procedure however you should NOT DRIVE or use heavy machinery for 24 hours (because of the sedation medicines used during the test).    SYMPTOMS TO REPORT IMMEDIATELY: A gastroenterologist can be reached at any hour.  During normal business hours, 8:30 AM to 5:00 PM Monday through Friday,  call [phone].  After hours and on weekends, please call the GI answering service at [phone] who will take a message and have the physician on call contact you.     Following upper endoscopy (EGD)  Vomiting of blood or coffee ground material  New chest pain or pain under the shoulder blades  Painful or persistently difficult swallowing  New shortness of breath  Fever of 100F or higher  Black, tarry-looking stools  FOLLOW UP: If any biopsies were taken you will be contacted by phone or by letter within the next 1-3 weeks.  Call your gastroenterologist if you have not heard about the biopsies in 3 weeks.  Our staff will call the home number listed on your records the next business day following your procedure to check on you and address any questions or concerns that you may have at that time regarding the information given to you following your procedure. This is a courtesy call and so if there is no answer at the home number and we have not heard from you through the emergency physician on call, we will assume that you have returned to your regular daily activities without incident.    INFORMATION ON GASTRITIS GIVEN TO YOU TODAY  GASTRIC EMPTYING SCAN WILL BE SCHEDULED BY DR PERRY'S OFFICE   SIGNATURES/CONFIDENTIALITY: You and/or your care partner have signed paperwork which will be entered into your electronic medical record.  These  signatures attest to the fact that that the information above on your After Visit Summary has been reviewed and is understood.  Full responsibility of the confidentiality of this discharge information lies with you and/or your care-partner. 

## 2014-04-03 NOTE — Op Note (Signed)
[place]  [address] [place], [id number]   ENDOSCOPY PROCEDURE REPORT  PATIENT: Patricia Bauer, Patricia Bauer  MR#: [id number] BIRTHDATE: Feb 04, 1971 , 43  yrs. old GENDER: female ENDOSCOPIST: Eustace Quail, MD REFERRED BY:  Arnette Norris, M.D. PROCEDURE DATE:  04/03/2014 PROCEDURE:  EGD w/ biopsy ASA CLASS:     Class I INDICATIONS:  nausea. MEDICATIONS: Monitored anesthesia care and Propofol 150 mg IV TOPICAL ANESTHETIC: none  DESCRIPTION OF PROCEDURE: After the risks benefits and alternatives of the procedure were thoroughly explained, informed consent was obtained.  The LB [id number] endoscope was introduced through the mouth and advanced to the second portion of the duodenum , Without limitations.  The instrument was slowly withdrawn as the mucosa was fully examined.  EXAM:The esophagus was normal.  The stomach revealed several small incidental benign fundic gland polyps.  The antrum revealed several scattered erosions.  CLO biopsy taken.  The duodenal bulb and post bulbar duodenum were normal.  Retroflexed views revealed no abnormalities.     The scope was then withdrawn from the patient and the procedure completed.  COMPLICATIONS: There were no immediate complications.  ENDOSCOPIC IMPRESSION: 1. Mild gastritis as manifested by antral erosion status post CLO biopsy 2. Incidental benign fundic gland polyps 3. Otherwise normal EGD. No explanation for nausea found on today's exam  RECOMMENDATIONS: 1.  Continue current medications 2.  Rx CLO if positive 3.  My office will arrange for you to have a Gastric Emptying Scan performed.  This is a radiology test that gives an idea of how well your stomach functions.  REPEAT EXAM:  eSigned:  Eustace Quail, MD 04/03/2014 2:30 PM    QA:ESLP, Tanja Port MD and The Patient

## 2014-04-03 NOTE — Progress Notes (Signed)
Called to room to assist during endoscopic procedure.  Patient ID and intended procedure confirmed with present staff. Received instructions for my participation in the procedure from the performing physician.  

## 2014-04-04 ENCOUNTER — Telehealth: Payer: Self-pay | Admitting: *Deleted

## 2014-04-04 ENCOUNTER — Telehealth: Payer: Self-pay

## 2014-04-04 ENCOUNTER — Other Ambulatory Visit: Payer: Self-pay

## 2014-04-04 DIAGNOSIS — R1084 Generalized abdominal pain: Secondary | ICD-10-CM

## 2014-04-04 LAB — HELICOBACTER PYLORI SCREEN-BIOPSY: UREASE: NEGATIVE

## 2014-04-04 NOTE — Telephone Encounter (Signed)
  Follow up Call-  Call back number 04/03/2014  Post procedure Call Back phone  # [phone]  Permission to leave phone message Yes     Patient questions:  Message left to call us if necessery.

## 2014-04-04 NOTE — Telephone Encounter (Signed)
Pt scheduled for GES at [facility] 04/21/14@7 :30am, pt to arrive there at 7:15am. Pt to be NPO after midnight and hold stomach meds. Pt aware of appt.

## 2014-04-21 ENCOUNTER — Ambulatory Visit (HOSPITAL_COMMUNITY)
Admission: RE | Admit: 2014-04-21 | Discharge: 2014-04-21 | Disposition: A | Discharge status: Home / Self Care | Payer: 59 | Source: Ambulatory Visit | Attending: Internal Medicine | Admitting: Internal Medicine

## 2014-04-21 DIAGNOSIS — R140 Abdominal distension (gaseous): Secondary | ICD-10-CM | POA: Diagnosis not present

## 2014-04-21 DIAGNOSIS — R1084 Generalized abdominal pain: Secondary | ICD-10-CM | POA: Insufficient documentation

## 2014-04-21 DIAGNOSIS — K219 Gastro-esophageal reflux disease without esophagitis: Secondary | ICD-10-CM | POA: Diagnosis not present

## 2014-04-21 DIAGNOSIS — R110 Nausea: Secondary | ICD-10-CM | POA: Insufficient documentation

## 2014-04-21 MED ORDER — TECHNETIUM TC 99M SULFUR COLLOID
1.9000 | Freq: Once | INTRAVENOUS | Status: AC | PRN
  Administered 2014-04-21: 1.9 via ORAL

## 2014-05-21 ENCOUNTER — Encounter: Payer: Self-pay | Admitting: Internal Medicine

## 2014-07-22 ENCOUNTER — Other Ambulatory Visit: Payer: Self-pay | Admitting: Family Medicine

## 2014-07-22 DIAGNOSIS — Z01419 Encounter for gynecological examination (general) (routine) without abnormal findings: Secondary | ICD-10-CM | POA: Insufficient documentation

## 2014-07-23 ENCOUNTER — Other Ambulatory Visit (INDEPENDENT_AMBULATORY_CARE_PROVIDER_SITE_OTHER): Payer: 59

## 2014-07-23 DIAGNOSIS — Z0000 Encounter for general adult medical examination without abnormal findings: Secondary | ICD-10-CM

## 2014-07-23 DIAGNOSIS — Z01419 Encounter for gynecological examination (general) (routine) without abnormal findings: Secondary | ICD-10-CM

## 2014-07-23 NOTE — Addendum Note (Signed)
Addended by: Marchia Bond on: 07/23/2014 10:34 AM   Modules accepted: Orders

## 2014-07-24 LAB — COMPREHENSIVE METABOLIC PANEL
A/G RATIO: 1.6 (ref 1.1–2.5)
ALT: 16 IU/L (ref 0–32)
AST: 15 IU/L (ref 0–40)
Albumin: 4.2 g/dL (ref 3.5–5.5)
Alkaline Phosphatase: 76 IU/L (ref 39–117)
BUN/Creatinine Ratio: 14 (ref 9–23)
BUN: 12 mg/dL (ref 6–24)
Bilirubin Total: <0.2 mg/dL (ref 0.0–1.2)
CALCIUM: 8.9 mg/dL (ref 8.7–10.2)
CO2: 23 mmol/L (ref 18–29)
Chloride: 99 mmol/L (ref 97–108)
Creatinine, Ser: 0.83 mg/dL (ref 0.57–1.00)
GFR calc Af Amer: 101 mL/min/{1.73_m2} (ref >59)
GFR calc non Af Amer: 87 mL/min/{1.73_m2} (ref >59)
GLUCOSE: 93 mg/dL (ref 65–99)
Globulin, Total: 2.6 g/dL (ref 1.5–4.5)
Potassium: 5 mmol/L (ref 3.5–5.2)
Sodium: 138 mmol/L (ref 134–144)
TOTAL PROTEIN: 6.8 g/dL (ref 6.0–8.5)

## 2014-07-24 LAB — CBC WITH DIFFERENTIAL/PLATELET
BASOS ABS: 0 10*3/uL (ref 0.0–0.2)
Basos: 0 %
EOS ABS: 0.1 10*3/uL (ref 0.0–0.4)
Eos: 1 %
HCT: 37.9 % (ref 34.0–46.6)
Hemoglobin: 12.4 g/dL (ref 11.1–15.9)
IMMATURE GRANULOCYTES: 0 %
Immature Grans (Abs): 0 10*3/uL (ref 0.0–0.1)
Lymphocytes Absolute: 2.2 10*3/uL (ref 0.7–3.1)
Lymphs: 36 %
MCH: 27.4 pg (ref 26.6–33.0)
MCHC: 32.7 g/dL (ref 31.5–35.7)
MCV: 84 fL (ref 79–97)
Monocytes Absolute: 0.4 10*3/uL (ref 0.1–0.9)
Monocytes: 7 %
NEUTROS ABS: 3.5 10*3/uL (ref 1.4–7.0)
Neutrophils Relative %: 56 %
Platelets: 298 10*3/uL (ref 150–379)
RBC: 4.52 x10E6/uL (ref 3.77–5.28)
RDW: 14.5 % (ref 12.3–15.4)
WBC: 6.2 10*3/uL (ref 3.4–10.8)

## 2014-07-24 LAB — LIPID PANEL
CHOL/HDL RATIO: 3.4 ratio (ref 0.0–4.4)
Cholesterol, Total: 201 mg/dL — ABNORMAL HIGH (ref 100–199)
HDL: 60 mg/dL (ref >39)
LDL Calculated: 123 mg/dL — ABNORMAL HIGH (ref 0–99)
Triglycerides: 90 mg/dL (ref 0–149)
VLDL Cholesterol Cal: 18 mg/dL (ref 5–40)

## 2014-07-24 LAB — TSH: TSH: 2.11 u[IU]/mL (ref 0.450–4.500)

## 2014-07-28 ENCOUNTER — Encounter: Payer: Self-pay | Admitting: Family Medicine

## 2014-08-04 ENCOUNTER — Encounter: Payer: Self-pay | Admitting: Family Medicine

## 2014-08-04 ENCOUNTER — Ambulatory Visit (INDEPENDENT_AMBULATORY_CARE_PROVIDER_SITE_OTHER): Payer: 59 | Admitting: Family Medicine

## 2014-08-04 VITALS — BP 120/80 | HR 84 | Temp 98.1°F | Ht 64.0 in | Wt 184.5 lb

## 2014-08-04 DIAGNOSIS — K219 Gastro-esophageal reflux disease without esophagitis: Secondary | ICD-10-CM

## 2014-08-04 DIAGNOSIS — Z0000 Encounter for general adult medical examination without abnormal findings: Secondary | ICD-10-CM | POA: Diagnosis not present

## 2014-08-04 DIAGNOSIS — Z1239 Encounter for other screening for malignant neoplasm of breast: Secondary | ICD-10-CM | POA: Diagnosis not present

## 2014-08-04 DIAGNOSIS — G43809 Other migraine, not intractable, without status migrainosus: Secondary | ICD-10-CM | POA: Diagnosis not present

## 2014-08-04 DIAGNOSIS — Z90710 Acquired absence of both cervix and uterus: Secondary | ICD-10-CM | POA: Diagnosis not present

## 2014-08-04 DIAGNOSIS — Z01419 Encounter for gynecological examination (general) (routine) without abnormal findings: Secondary | ICD-10-CM

## 2014-08-04 MED ORDER — PANTOPRAZOLE SODIUM 40 MG PO TBEC: 40.0000 mg | DELAYED_RELEASE_TABLET | Freq: Every day | ORAL | Status: DC

## 2014-08-04 NOTE — Patient Instructions (Signed)
Great to see you. Please call to schedule your mammogram.  Do some research on Elavil and let me know if you want to try it.

## 2014-08-04 NOTE — Assessment & Plan Note (Signed)
Reviewed preventive care protocols, scheduled due services, and updated immunizations Discussed nutrition, exercise, diet, and healthy lifestyle.  

## 2014-08-04 NOTE — Assessment & Plan Note (Signed)
Discussed long term side effects of PPI. She would prefer to continue for now given severity of GERD. eRx sent.

## 2014-08-04 NOTE — Progress Notes (Signed)
Subjective:   [name], female    DOB: Dec 29, 1970, 44 y.o.   MRN: [id number]  Patricia Bauer is a pleasant 44 y.o. year old female who presents to clinic today with Annual Exam  on 08/04/2014  HPI:  Last mammogram 03/20/12 Has appt with GYN next week.  S/p hysterectomy (partial).  Doing "ok."  Migraines have been worse- sometimes having two per week associated with nausea, vomiting, photophobia. Has been intolerant to multiple preventative rxs in past, including topamax. "side effects" weren't worth it.  GERD- has been controlled on protonix.  Concerned about long term side effects.  Lab Results  Component Value Date   CHOL 201* 07/23/2014   HDL 60 07/23/2014   LDLCALC 123* 07/23/2014   TRIG 90 07/23/2014   CHOLHDL 3.4 07/23/2014   Lab Results  Component Value Date   CREATININE 0.83 07/23/2014   Lab Results  Component Value Date   WBC 6.2 07/23/2014   HGB 12.4 07/23/2014   HCT 37.9 07/23/2014   MCV 84 07/23/2014   PLT 298 07/23/2014   Lab Results  Component Value Date   TSH 2.110 07/23/2014   Lab Results  Component Value Date   NA 138 07/23/2014   K 5.0 07/23/2014   CL 99 07/23/2014   CO2 23 07/23/2014   Lab Results  Component Value Date   ALT 16 07/23/2014   AST 15 07/23/2014   ALKPHOS 76 07/23/2014   BILITOT <0.2 07/23/2014   Current Outpatient Prescriptions on File Prior to Visit  Medication Sig Dispense Refill  . Famotidine (ACID CONTROLLER PO) Take 1 tablet by mouth daily. [employer] Brand, Maximum Strength    . promethazine (PHENERGAN) 25 MG tablet Take 25 mg by mouth every 6 (six) hours as needed for nausea or vomiting.    . traMADol (ULTRAM) 50 MG tablet Take 1-2 tablets (50-100 mg total) by mouth every 6 (six) hours as needed (mild pain). (Patient not taking: Reported on 08/04/2014) 60 tablet 1   No current facility-administered medications on file prior to visit.    Allergies  Allergen Reactions  . Topamax [Topiramate]     Body aches   . Hydrocodone Anxiety and Palpitations    Past Medical History  Diagnosis Date  . Anemia   . Anxiety   . GERD (gastroesophageal reflux disease)   . History of hysterectomy     still has both ovaries  . History of migraine headaches   . PONV (postoperative nausea and vomiting)   . Depression   . Headache     migraines   . Colon polyp     Past Surgical History  Procedure Laterality Date  . Knee athroscopy  5/12    LT  . Laparoscopic vaginal hysterectomy  5/11    Supra cervical  . Wisdom tooth extraction  1995  . Knee arthroscopy    . Abdominal hysterectomy    . Colonoscopy    . Upper gi endoscopy    . Partial knee arthroplasty Left 02/10/2014    Procedure: LEFT MEDIAL UNICOMPARTMENTAL KNEE ARTHROPLASTY;  Surgeon: Gearlean Alf, MD;  Location: [facility];  Service: Orthopedics;  Laterality: Left;    Family History  Problem Relation Age of Onset  . Diabetes Father   . Stroke Father   . Heart attack Father   . Hyperlipidemia Mother   . Hypertension Mother   . Colon cancer Neg Hx   . Colon polyps Neg Hx   . Kidney disease Neg Hx   .  Gallbladder disease Neg Hx   . Esophageal cancer Neg Hx     History   Social History  . Marital Status: Single    Spouse Name: N/A  . Number of Children: 1  . 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 Smoking status: Never Smoker   . Smokeless tobacco: Never Used  . Alcohol Use: 0.0 oz/week    0 Standard drinks or equivalent per week     Comment: occassional  . Drug Use: No  . Sexual Activity: No   Other Topics Concern  . Not on file   Social History Narrative   The PMH, PSH, Social History, Family History, Medications, and allergies have been reviewed in [facility], and have been updated if relevant.   Review of Systems  Constitutional: Negative.   Eyes: Negative.   Respiratory: Negative.   Cardiovascular: Negative.   Gastrointestinal:  Positive for abdominal pain. Negative for nausea, vomiting, diarrhea, constipation, blood in stool, abdominal distention, anal bleeding and rectal pain.  Endocrine: Negative.   Allergic/Immunologic: Negative.   Neurological: Positive for headaches. Negative for dizziness and facial asymmetry.  Hematological: Negative.   Psychiatric/Behavioral: Negative.        Objective:    BP 120/80 mmHg  Pulse 84  Temp(Src) 98.1 F (36.7 C) (Oral)  Ht 5\' 4"  (1.626 m)  Wt 184 lb 8 oz (83.689 kg)  BMI 31.65 kg/m2  LMP 09/07/2009   Physical Exam   General:  Well-developed,well-nourished,in no acute distress; alert,appropriate and cooperative throughout examination Head:  normocephalic and atraumatic.   Eyes:  vision grossly intact, pupils equal, pupils round, and pupils reactive to light.   Ears:  R ear normal and L ear normal.   Nose:  no external deformity.   Mouth:  good dentition.   Neck:  No deformities, masses, or tenderness noted. Breasts:  No mass, nodules, thickening, tenderness, bulging, retraction, inflamation, nipple discharge or skin changes noted.   Lungs:  Normal respiratory effort, chest expands symmetrically. Lungs are clear to auscultation, no crackles or wheezes. Heart:  Normal rate and regular rhythm. S1 and S2 normal without gallop, murmur, click, rub or other extra sounds. Abdomen:  Bowel sounds positive,abdomen soft and non-tender without masses, organomegaly or hernias noted. Msk:  No deformity or scoliosis noted of thoracic or lumbar spine.   Extremities:  No clubbing, cyanosis, edema, or deformity noted with normal full range of motion of all joints.   Neurologic:  alert & oriented X3 and gait normal.   Skin:  Intact without suspicious lesions or rashes Cervical Nodes:  No lymphadenopathy noted Axillary Nodes:  No palpable lymphadenopathy Psych:  Cognition and judgment appear intact. Alert and cooperative with normal attention span and concentration. No apparent  delusions, illusions, hallucinations     Assessment & Plan:   Well woman exam  H/O: hysterectomy No Follow-up on file.

## 2014-08-04 NOTE — Assessment & Plan Note (Signed)
Deteriorated. Was seeing neuro- cannot afford to see them anymore. She is not sure if she has tried elavil.  She will look it up and get back to me.

## 2014-08-04 NOTE — Progress Notes (Signed)
Pre visit review using our clinic review tool, if applicable. No additional management support is needed unless otherwise documented below in the visit note. 

## 2014-09-08 ENCOUNTER — Encounter: Payer: Self-pay | Admitting: Family Medicine

## 2014-09-08 ENCOUNTER — Ambulatory Visit (INDEPENDENT_AMBULATORY_CARE_PROVIDER_SITE_OTHER): Payer: 59 | Admitting: Family Medicine

## 2014-09-08 VITALS — BP 114/70 | HR 73 | Temp 98.4°F | Ht 64.5 in | Wt 187.0 lb

## 2014-09-08 DIAGNOSIS — F419 Anxiety disorder, unspecified: Secondary | ICD-10-CM

## 2014-09-08 DIAGNOSIS — E669 Obesity, unspecified: Secondary | ICD-10-CM

## 2014-09-08 MED ORDER — ALPRAZOLAM 0.25 MG PO TABS: 0.2500 mg | ORAL_TABLET | Freq: Every evening | ORAL | Status: DC | PRN

## 2014-09-08 MED ORDER — CITALOPRAM HYDROBROMIDE 20 MG PO TABS
ORAL_TABLET | ORAL | Status: DC
Start: 2014-09-08 — End: 2014-11-12

## 2014-09-08 NOTE — Patient Instructions (Addendum)
Good to see you.  We are starting celexa 20 mg daily - ok to break in half for first few days. Xanax as needed for panic attacks.  Elavil for migraine prevention.  Please update me in a few weeks.

## 2014-09-08 NOTE — Assessment & Plan Note (Signed)
>  25 minutes spent in face to face time with patient, >50% spent in counselling or coordination of care Discussed tx options.  Psychotherapy deferred. After discussing rxs, agreed upon celexa 20 mg daily with low dose xanax prn panic attacks- discussed sedation and addiction potential. She will update me in 3 weeks. The patient indicates understanding of these issues and agrees with the plan.

## 2014-09-08 NOTE — Progress Notes (Signed)
Subjective:   [name], female    DOB: [birth year], 44 y.o.   MRN: [id number]  Patricia Bauer is a pleasant 44 y.o. year old female who presents to clinic today with Anxiety and Weight Gain  on 09/08/2014  HPI: Anxiety- has been an issue intermittently for years.  Having more panic attacks particularly in public over past few months.  Nothing seems to make them better except for time. She does have history of depression but currently doesn't feel depressed.  She has never slept well. In past, was on zoloft and Wellbutrin.  They worked well but she felt like she was "numb."  Does not want to feel that way.  Denies SI or HI.    She is stress eater and her knee problems are keeping her from running.  Gaining weight.  Asking about weight loss rxs- appetite suppressant.  Wt Readings from Last 3 Encounters:  09/08/14 187 lb (84.823 kg)  08/04/14 184 lb 8 oz (83.689 kg)  04/03/14 184 lb (83.462 kg)    Current Outpatient Prescriptions on File Prior to Visit  Medication Sig Dispense Refill  . Famotidine (ACID CONTROLLER PO) Take 1 tablet by mouth daily. [employer], Maximum Strength    . pantoprazole (PROTONIX) 40 MG tablet Take 1 tablet (40 mg total) by mouth daily. 90 tablet 3   No current facility-administered medications on file prior to visit.    Allergies  Allergen Reactions  . Topamax [Topiramate]     Body aches   . Hydrocodone Anxiety and Palpitations    Past Medical History  Diagnosis Date  . Anemia   . Anxiety   . GERD (gastroesophageal reflux disease)   . History of hysterectomy     still has both ovaries  . History of migraine headaches   . PONV (postoperative nausea and vomiting)   . Depression   . Headache     migraines   . Colon polyp     Past Surgical History  Procedure Laterality Date  . Knee athroscopy  5/12    LT  . Laparoscopic vaginal hysterectomy  5/11    Supra cervical  . Wisdom tooth extraction  1995  . Knee arthroscopy     . Abdominal hysterectomy    . Colonoscopy    . Upper gi endoscopy    . Partial knee arthroplasty Left 02/10/2014    Procedure: LEFT MEDIAL UNICOMPARTMENTAL KNEE ARTHROPLASTY;  Surgeon: Gearlean Alf, MD;  Location: [facility];  Service: Orthopedics;  Laterality: Left;    Family History  Problem Relation Age of Onset  . Diabetes Father   . Stroke Father   . Heart attack Father   . Hyperlipidemia Mother   . Hypertension Mother   . Colon cancer Neg Hx   . Colon polyps Neg Hx   . Kidney disease Neg Hx   . Gallbladder disease Neg Hx   . Esophageal cancer Neg Hx     History   Social History  . Marital Status: Single    Spouse Name: N/A  . Number of Children: 1  . 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 Smoking status: Never Smoker   . Smokeless tobacco: Never Used  . Alcohol Use: 0.0 oz/week    0 Standard drinks or equivalent per week     Comment: occassional  . Drug Use: No  . Sexual Activity: No   Other Topics Concern  .  Not on file   Social History Narrative   The PMH, PSH, Social History, Family History, Medications, and allergies have been reviewed in [facility], and have been updated if relevant.   Review of Systems  Constitutional: Positive for fatigue.  Respiratory: Negative.   Cardiovascular: Negative.   Psychiatric/Behavioral: Positive for sleep disturbance and decreased concentration. Negative for suicidal ideas, hallucinations, behavioral problems, self-injury, dysphoric mood and agitation. The patient is nervous/anxious.   All other systems reviewed and are negative.      Objective:    BP 114/70 mmHg  Pulse 73  Temp(Src) 98.4 F (36.9 C) (Oral)  Ht 5' 4.5" (1.638 m)  Wt 187 lb (84.823 kg)  BMI 31.61 kg/m2  SpO2 98%  LMP 09/07/2009   Physical Exam  Constitutional: She is oriented to person, place, and time. She appears well-developed and well-nourished. No distress.  HENT:    Head: Normocephalic.  Eyes: Conjunctivae are normal.  Cardiovascular: Normal rate.   Pulmonary/Chest: Effort normal.  Musculoskeletal: Normal range of motion.  Neurological: She is alert and oriented to person, place, and time. No cranial nerve deficit.  Skin: Skin is warm and dry.  Psychiatric: She has a normal mood and affect. Her behavior is normal. Judgment and thought content normal.  Nursing note and vitals reviewed.         Assessment & Plan:   Anxiety  Obesity No Follow-up on file.

## 2014-09-08 NOTE — Progress Notes (Signed)
Pre visit review using our clinic review tool, if applicable. No additional management support is needed unless otherwise documented below in the visit note. 

## 2014-09-08 NOTE — Assessment & Plan Note (Signed)
Deteriorated.  BMI 31.61. Discussed weight loss plan. Advised against appetite suppressants as they may worsen her anxiety since they are stimulants.  She will work on counting calories and trying to walk 30 minutes per day.

## 2014-10-08 ENCOUNTER — Other Ambulatory Visit: Payer: Self-pay | Admitting: *Deleted

## 2014-10-08 MED ORDER — PANTOPRAZOLE SODIUM 40 MG PO TBEC: 40.0000 mg | DELAYED_RELEASE_TABLET | Freq: Every day | ORAL | Status: DC

## 2014-10-13 ENCOUNTER — Telehealth: Payer: Self-pay | Admitting: Family Medicine

## 2014-10-13 NOTE — Telephone Encounter (Signed)
Pt has appt on 10/14/14 at 9 AM with Dr Deborra Medina.

## 2014-10-13 NOTE — Telephone Encounter (Signed)
Patient Name: Patricia Bauer DOB: [birth year] Initial Comment Caller states having side effects from Celexa 20mg  daily, severe nausea and headache. [phone] Nurse Assessment Nurse: Ronnald Ramp, RN, Miranda Date/Time (Eastern Time): 10/13/2014 8:34:25 AM Confirm and document reason for call. If symptomatic, describe symptoms. ---Caller states she started taking Celexa 1 month ago and has slight nausea since then but it is getting worse. Also with headaches off and on, no headache now. Has the patient traveled out of the country within the last 30 days? ---No Does the patient require triage? ---Yes Related visit to physician within the last 2 weeks? ---No Does the PT have any chronic conditions? (i.e. diabetes, asthma, etc.) ---Yes List chronic conditions. ---Depression/anxiety, GERD, Did the patient indicate they were pregnant? ---No Guidelines Guideline Title Affirmed Question Affirmed Notes Nausea Taking prescription medication that could cause nausea (e.g., narcotics/opiates, antibiotics, OCPs, many others) Final Disposition User Call PCP within 24 Hours Ronnald Ramp, RN, Miranda Comments Appt scheduled for 9:00am tomorrow 6/28 with Dr. Deborra Medina. Caller asked about stopping the medication today and told caller that this medication may not be safe to just stop and she could experience withdrawal symptoms. If possible she would like to speak with Dr. Deborra Medina about that today. Told caller I would make a note on the chart.

## 2014-10-14 ENCOUNTER — Ambulatory Visit (INDEPENDENT_AMBULATORY_CARE_PROVIDER_SITE_OTHER): Payer: 59 | Admitting: Family Medicine

## 2014-10-14 ENCOUNTER — Telehealth: Payer: Self-pay

## 2014-10-14 ENCOUNTER — Encounter: Payer: Self-pay | Admitting: Family Medicine

## 2014-10-14 VITALS — BP 124/76 | HR 67 | Temp 97.9°F | Wt 184.5 lb

## 2014-10-14 DIAGNOSIS — R110 Nausea: Secondary | ICD-10-CM | POA: Diagnosis not present

## 2014-10-14 DIAGNOSIS — F419 Anxiety disorder, unspecified: Secondary | ICD-10-CM | POA: Diagnosis not present

## 2014-10-14 DIAGNOSIS — G43809 Other migraine, not intractable, without status migrainosus: Secondary | ICD-10-CM | POA: Diagnosis not present

## 2014-10-14 MED ORDER — PROMETHAZINE HCL 25 MG PO TABS: 25.0000 mg | ORAL_TABLET | Freq: Three times a day (TID) | ORAL | Status: DC | PRN

## 2014-10-14 NOTE — Patient Instructions (Signed)
In order to wean off celexa:  1 tablet every other day for 1 week, then 1/2 tablet every other day for 1 week and stop.  Phenergan as needed for nausea- this will make you sleepy.  Call you me in 2 weeks with a follow up.

## 2014-10-14 NOTE — Telephone Encounter (Signed)
Pt left v/m; pt seen earlier today and promethazine was sent to mail order pharmacy; pt has already cancelled the mail order rx and pt request rx sent to [place] with pt and advised done as requested. For FYI to Dr Deborra Medina.

## 2014-10-14 NOTE — Assessment & Plan Note (Signed)
Improved with celexa but unfortunately appears to not be tolerating celexa well. We discussed our options and she would like to wean off the celexa without starting something else right away- see AVS for wean instructions. She will call me in 2 weeks with an update and we can consider a trial of another SSRI/SNRI. The patient indicates understanding of these issues and agrees with the plan.

## 2014-10-14 NOTE — Progress Notes (Signed)
Pre visit review using our clinic review tool, if applicable. No additional management support is needed unless otherwise documented below in the visit note. 

## 2014-10-14 NOTE — Progress Notes (Signed)
Subjective:   [name], female    DOB: 1970-05-29, 44 y.o.   MRN: [id number]  Patricia Bauer is a pleasant 44 y.o. year old female who presents to clinic today with Nausea and Headache  on 10/14/2014  HPI:  One month of persistent headache and nausea without vomiting. Headache is constant, "low grade and all over."  Feels different from her migraines.  Feels it started shortly after starting celexa.  No diarrhea, blood in her stool or abdominal pain.  Had some left over zofran- not helping much with her nausea.  No visual changes.  No other focal neurological deficits.  She does feel the celexa has helped "tremendously" with her anxiety.  Current Outpatient Prescriptions on File Prior to Visit  Medication Sig Dispense Refill  . ALPRAZolam (XANAX) 0.25 MG tablet Take 1 tablet (0.25 mg total) by mouth at bedtime as needed for anxiety. 30 tablet 0  . citalopram (CELEXA) 20 MG tablet 1 tab by mouth daily. 30 tablet 3  . Famotidine (ACID CONTROLLER PO) Take 1 tablet by mouth daily. [employer] Brand, Maximum Strength    . pantoprazole (PROTONIX) 40 MG tablet Take 1 tablet (40 mg total) by mouth daily. 90 tablet 3   No current facility-administered medications on file prior to visit.    Allergies  Allergen Reactions  . Topamax [Topiramate]     Body aches   . Hydrocodone Anxiety and Palpitations    Past Medical History  Diagnosis Date  . Anemia   . Anxiety   . GERD (gastroesophageal reflux disease)   . History of hysterectomy     still has both ovaries  . History of migraine headaches   . PONV (postoperative nausea and vomiting)   . Depression   . Headache     migraines   . Colon polyp     Past Surgical History  Procedure Laterality Date  . Knee athroscopy  5/12    LT  . Laparoscopic vaginal hysterectomy  5/11    Supra cervical  . Wisdom tooth extraction  1995  . Knee arthroscopy    . Abdominal hysterectomy    . Colonoscopy    . Upper gi  endoscopy    . Partial knee arthroplasty Left 02/10/2014    Procedure: LEFT MEDIAL UNICOMPARTMENTAL KNEE ARTHROPLASTY;  Surgeon: Gearlean Alf, MD;  Location: [facility];  Service: Orthopedics;  Laterality: Left;    Family History  Problem Relation Age of Onset  . Diabetes Father   . Stroke Father   . Heart attack Father   . Hyperlipidemia Mother   . Hypertension Mother   . Colon cancer Neg Hx   . Colon polyps Neg Hx   . Kidney disease Neg Hx   . Gallbladder disease Neg Hx   . Esophageal cancer Neg Hx     History   Social History  . Marital Status: Single    Spouse Name: N/A  . Number of Children: 1  . 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 Smoking status: Never Smoker   . Smokeless tobacco: Never Used  . Alcohol Use: 0.0 oz/week    0 Standard drinks or equivalent per week     Comment: occassional  . Drug Use: No  . Sexual Activity: No   Other Topics Concern  . Not on file   Social History Narrative   The PMH, PSH, Social History, Family History, Medications,  and allergies have been reviewed in [facility], and have been updated if relevant.   Review of Systems  Constitutional: Positive for appetite change and fatigue. Negative for fever and unexpected weight change.  Eyes: Negative.   Gastrointestinal: Positive for nausea. Negative for vomiting, abdominal pain, diarrhea, constipation, blood in stool, abdominal distention, anal bleeding and rectal pain.  Neurological: Positive for headaches. Negative for dizziness, tremors, seizures, syncope, facial asymmetry, speech difficulty, weakness, light-headedness and numbness.  Psychiatric/Behavioral: Negative.   All other systems reviewed and are negative.      Objective:    BP 124/76 mmHg  Pulse 67  Temp(Src) 97.9 F (36.6 C) (Oral)  Wt 184 lb 8 oz (83.689 kg)  SpO2 98%  LMP 09/07/2009  Wt Readings from Last 3 Encounters:  10/14/14 184 lb 8 oz  (83.689 kg)  09/08/14 187 lb (84.823 kg)  08/04/14 184 lb 8 oz (83.689 kg)    Physical Exam  Constitutional: She is oriented to person, place, and time. She appears well-developed and well-nourished. No distress.  HENT:  Head: Normocephalic and atraumatic.  Eyes: Conjunctivae are normal.  Cardiovascular: Normal rate.   Pulmonary/Chest: Effort normal.  Musculoskeletal: Normal range of motion. She exhibits no edema.  Neurological: She is alert and oriented to person, place, and time. No cranial nerve deficit.  Skin: Skin is warm and dry.  Psychiatric: She has a normal mood and affect. Her behavior is normal. Judgment and thought content normal.  Nursing note and vitals reviewed.         Assessment & Plan:   Nausea without vomiting  Other type of migraine without status migrainosus  Anxiety No Follow-up on file.

## 2014-10-14 NOTE — Assessment & Plan Note (Signed)
New- constant with headache- per pt, not similar to her migraines. Hopefully will resolve once she weans off celexa- see above. eRx sent for phenergan prn nausea.  Discussed sedation potential. Call or return to clinic prn if these symptoms worsen or fail to improve as anticipated. The patient indicates understanding of these issues and agrees with the plan.

## 2014-11-07 ENCOUNTER — Ambulatory Visit (INDEPENDENT_AMBULATORY_CARE_PROVIDER_SITE_OTHER): Payer: 59 | Admitting: Nurse Practitioner

## 2014-11-07 VITALS — BP 104/78 | HR 71 | Temp 98.0°F | Resp 16 | Ht 64.5 in | Wt 187.8 lb

## 2014-11-07 DIAGNOSIS — K648 Other hemorrhoids: Secondary | ICD-10-CM | POA: Diagnosis not present

## 2014-11-07 MED ORDER — HYDROCORTISONE 2.5 % RE CREA: 1.0000 "application " | TOPICAL_CREAM | Freq: Two times a day (BID) | RECTAL | Status: DC

## 2014-11-07 NOTE — Progress Notes (Signed)
Pre visit review using our clinic review tool, if applicable. No additional management support is needed unless otherwise documented below in the visit note. 

## 2014-11-07 NOTE — Patient Instructions (Signed)

## 2014-11-07 NOTE — Progress Notes (Signed)
   Subjective:    [name], female    DOB: January 02, 1971, 44 y.o.   MRN: [id number]  HPI  Patricia Bauer is a 44 yo female with a CC of rectal bleeding x 2 days.   1)  Pt reports she had slight blood when wiping and painful area of her bottom with bowel movements, walking, and sitting. She placed preparation H around her rectum and reports she felt an enlarged area.   Review of Systems  Constitutional: Negative for fever, chills, diaphoresis and fatigue.  Gastrointestinal: Positive for anal bleeding and rectal pain. Negative for nausea, vomiting, abdominal pain, diarrhea, constipation, blood in stool and abdominal distention.  Skin: Negative for rash.      Objective:   Physical Exam  Constitutional: She is oriented to person, place, and time. She appears well-developed and well-nourished. No distress.  BP 104/78 mmHg  Pulse 71  Temp(Src) 98 F (36.7 C)  Resp 16  Ht 5' 4.5" (1.638 m)  Wt 187 lb 12.8 oz (85.186 kg)  BMI 31.75 kg/m2  SpO2 98%  LMP 09/07/2009   HENT:  Head: Normocephalic and atraumatic.  Right Ear: External ear normal.  Left Ear: External ear normal.  Cardiovascular: Normal rate, regular rhythm and normal heart sounds.   Pulmonary/Chest: Effort normal and breath sounds normal. No respiratory distress. She has no wheezes. She has no rales. She exhibits no tenderness.  Genitourinary:     Neurological: She is alert and oriented to person, place, and time. No cranial nerve deficit. She exhibits normal muscle tone. Coordination normal.  Skin: Skin is warm and dry. No rash noted. She is not diaphoretic.  Psychiatric: She has a normal mood and affect. Her behavior is normal. Judgment and thought content normal.      Assessment & Plan:

## 2014-11-09 ENCOUNTER — Encounter: Payer: Self-pay | Admitting: Nurse Practitioner

## 2014-11-09 DIAGNOSIS — K648 Other hemorrhoids: Secondary | ICD-10-CM | POA: Insufficient documentation

## 2014-11-09 NOTE — Assessment & Plan Note (Signed)
Pt presents with stable, but painful hemorrhoid. Discussed options with pt. She is using preparation H products. Dr. Lacinda Axon was able to consult and examine the hemorrhoid. It is not thrombosed and surgery is not an option for the pt at this time due to work schedule. We will do conservative therapy and if future GI referral is needed pt will inform us. FU prn worsening/failure to improve.   Gave pt anusol cream, gave pt handout and reviewed with her.

## 2014-11-12 ENCOUNTER — Encounter: Payer: Self-pay | Admitting: Family Medicine

## 2014-11-12 ENCOUNTER — Ambulatory Visit (INDEPENDENT_AMBULATORY_CARE_PROVIDER_SITE_OTHER): Payer: 59 | Admitting: Family Medicine

## 2014-11-12 VITALS — BP 112/80 | HR 53 | Temp 98.0°F | Wt 184.0 lb

## 2014-11-12 DIAGNOSIS — F419 Anxiety disorder, unspecified: Secondary | ICD-10-CM

## 2014-11-12 DIAGNOSIS — Z8261 Family history of arthritis: Secondary | ICD-10-CM

## 2014-11-12 DIAGNOSIS — M2550 Pain in unspecified joint: Secondary | ICD-10-CM | POA: Insufficient documentation

## 2014-11-12 MED ORDER — ESCITALOPRAM OXALATE 10 MG PO TABS: 10.0000 mg | ORAL_TABLET | Freq: Every day | ORAL | Status: DC

## 2014-11-12 NOTE — Assessment & Plan Note (Signed)
Deteriorated. >25 minutes spent in face to face time with patient, >50% spent in counselling or coordination of care Discussed tx options.  Would be very reasonable at this point to refer to psychiatry since she has failed several SSRIs/SNRIs at this point.  She would prefer to try one more rx with me then would agree to referral. eRx sent for lexapro 20 mg daily, continue xanax prn panic attacks. She will call me in 3--4 weeks with an update. The patient indicates understanding of these issues and agrees with the plan.

## 2014-11-12 NOTE — Assessment & Plan Note (Signed)
Due to FH of RA- check labs. The patient indicates understanding of these issues and agrees with the plan. Orders Placed This Encounter  Procedures  . Rheumatoid Factor  . Sedimentation Rate

## 2014-11-12 NOTE — Patient Instructions (Signed)
Great to see you. We are starting Lexapro 20 mg daily.  Please call me in 3-4 weeks with an update.

## 2014-11-12 NOTE — Addendum Note (Signed)
Addended by: Royann Shivers A on: 11/12/2014 07:41 AM   Modules accepted: Orders

## 2014-11-12 NOTE — Progress Notes (Signed)
Pre visit review using our clinic review tool, if applicable. No additional management support is needed unless otherwise documented below in the visit note. 

## 2014-11-12 NOTE — Progress Notes (Signed)
Subjective:   [name], female    DOB: 11-Jul-1970, 44 y.o.   MRN: [id number]  Patricia Bauer is a pleasant 44 y.o. year old female who presents to clinic today with Follow-up  on 11/12/2014  HPI:  Anxiety- when I saw her last month, we weaned her off celexa.  It was causing persistent headache and nausea without vomiting, different from her migraines.  She did feel the celexa has helped "tremendously" with her anxiety but wanted to wean off of it.  Very panicky and anxious now that she is no longer taking celexa.  Xanax does help with panic attacks.  HA and nausea have resolved. Not sleeping well.  Zoloft gave her HA in past.  Also has tried paxil and it made her feel "crazy" and like she was in a tunnel.  Hands and feet also aching- sister has h/o RA.  Current Outpatient Prescriptions on File Prior to Visit  Medication Sig Dispense Refill  . ALPRAZolam (XANAX) 0.25 MG tablet Take 1 tablet (0.25 mg total) by mouth at bedtime as needed for anxiety. 30 tablet 0  . Famotidine (ACID CONTROLLER PO) Take 1 tablet by mouth daily. [employer], Maximum Strength    . pantoprazole (PROTONIX) 40 MG tablet Take 1 tablet (40 mg total) by mouth daily. 90 tablet 3   No current facility-administered medications on file prior to visit.    Allergies  Allergen Reactions  . Topamax [Topiramate]     Body aches   . Hydrocodone Anxiety and Palpitations    Past Medical History  Diagnosis Date  . Anemia   . Anxiety   . GERD (gastroesophageal reflux disease)   . History of hysterectomy     still has both ovaries  . History of migraine headaches   . PONV (postoperative nausea and vomiting)   . Depression   . Headache     migraines   . Colon polyp     Past Surgical History  Procedure Laterality Date  . Knee athroscopy  5/12    LT  . Laparoscopic vaginal hysterectomy  5/11    Supra cervical  . Wisdom tooth extraction  1995  . Knee arthroscopy    . Abdominal  hysterectomy    . Colonoscopy    . Upper gi endoscopy    . Partial knee arthroplasty Left 02/10/2014    Procedure: LEFT MEDIAL UNICOMPARTMENTAL KNEE ARTHROPLASTY;  Surgeon: Gearlean Alf, MD;  Location: [facility];  Service: Orthopedics;  Laterality: Left;    Family History  Problem Relation Age of Onset  . Diabetes Father   . Stroke Father   . Heart attack Father   . Hyperlipidemia Mother   . Hypertension Mother   . Colon cancer Neg Hx   . Colon polyps Neg Hx   . Kidney disease Neg Hx   . Gallbladder disease Neg Hx   . Esophageal cancer Neg Hx     History   Social History  . Marital Status: Single    Spouse Name: N/A  . Number of Children: 1  . 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 Smoking status: Never Smoker   . Smokeless tobacco: Never Used  . Alcohol Use: 0.0 oz/week    0 Standard drinks or equivalent per week     Comment: occassional  . Drug Use: No  . Sexual Activity: No   Other Topics Concern  . Not  on file   Social History Narrative   The PMH, PSH, Social History, Family History, Medications, and allergies have been reviewed in [facility], and have been updated if relevant.   Review of Systems  Constitutional: Positive for fatigue. Negative for fever, appetite change and unexpected weight change.  Eyes: Negative.   Gastrointestinal: Negative for nausea, vomiting, abdominal pain, diarrhea, constipation, blood in stool, abdominal distention, anal bleeding and rectal pain.  Musculoskeletal: Positive for arthralgias.  Neurological: Negative for dizziness, tremors, seizures, syncope, facial asymmetry, speech difficulty, weakness, light-headedness, numbness and headaches.  Psychiatric/Behavioral: Positive for sleep disturbance and dysphoric mood. Negative for suicidal ideas and hallucinations. The patient is not nervous/anxious.   All other systems reviewed and are negative.      Objective:      BP 112/80 mmHg  Pulse 53  Temp(Src) 98 F (36.7 C) (Tympanic)  Wt 184 lb (83.462 kg)  SpO2 98%  LMP 09/07/2009  Wt Readings from Last 3 Encounters:  11/12/14 184 lb (83.462 kg)  11/07/14 187 lb 12.8 oz (85.186 kg)  10/14/14 184 lb 8 oz (83.689 kg)    Physical Exam  Constitutional: She is oriented to person, place, and time. She appears well-developed and well-nourished. No distress.  HENT:  Head: Normocephalic and atraumatic.  Eyes: Conjunctivae are normal.  Cardiovascular: Normal rate.   Pulmonary/Chest: Effort normal.  Musculoskeletal: Normal range of motion. She exhibits no edema.  Neurological: She is alert and oriented to person, place, and time. No cranial nerve deficit.  Skin: Skin is warm and dry.  Psychiatric: She has a normal mood and affect. Her behavior is normal. Judgment and thought content normal.  Nursing note and vitals reviewed.         Assessment & Plan:   Anxiety No Follow-up on file.

## 2014-11-13 LAB — RHEUMATOID FACTOR: RHEUMATOID FACTOR: 11.3 [IU]/mL (ref 0.0–13.9)

## 2014-11-13 LAB — SEDIMENTATION RATE: Sed Rate: 8 mm/hr (ref 0–32)

## 2014-11-14 ENCOUNTER — Encounter: Payer: Self-pay | Admitting: *Deleted

## 2014-11-27 ENCOUNTER — Telehealth: Payer: Self-pay | Admitting: Family Medicine

## 2014-11-27 NOTE — Telephone Encounter (Signed)
FMLA forms in Dr. Deborra Medina INbox.

## 2014-12-01 NOTE — Telephone Encounter (Signed)
Pt informed FMLA paperwork faxed to [employer] and a copy at front desk for pick up.  Copy for office file Copy to scan center Copy for charge

## 2014-12-01 NOTE — Telephone Encounter (Signed)
Pt left v/m requesting cb with status of FMLA paperwork and request cost for completing the FMLA.

## 2014-12-01 NOTE — Telephone Encounter (Signed)
Forms signed and in my box. 

## 2014-12-02 DIAGNOSIS — Z7689 Persons encountering health services in other specified circumstances: Secondary | ICD-10-CM

## 2014-12-05 NOTE — Telephone Encounter (Signed)
Last page needed to be completed Pt completed this   fmla paperwork  In Dr Hulen Shouts IN BOX For review and signature

## 2014-12-08 DIAGNOSIS — Z0279 Encounter for issue of other medical certificate: Secondary | ICD-10-CM

## 2014-12-08 NOTE — Telephone Encounter (Signed)
Left message letting pt know paperwork was faxed to [employer] # [phone]  Copy for pt Copy for scan Copy for billing Copy for file

## 2014-12-16 ENCOUNTER — Ambulatory Visit (INDEPENDENT_AMBULATORY_CARE_PROVIDER_SITE_OTHER): Payer: 59 | Admitting: Family Medicine

## 2014-12-16 ENCOUNTER — Encounter: Payer: Self-pay | Admitting: Family Medicine

## 2014-12-16 VITALS — BP 116/70 | HR 65 | Temp 98.1°F | Wt 189.8 lb

## 2014-12-16 DIAGNOSIS — F419 Anxiety disorder, unspecified: Secondary | ICD-10-CM

## 2014-12-16 NOTE — Progress Notes (Signed)
Subjective:   [name], female    DOB: 1970-05-18, 44 y.o.   MRN: [id number]  Patricia Bauer is a pleasant 44 y.o. year old female who presents to clinic today with Follow-up and Hand Pain  on 12/16/2014  HPI:  Anxiety- when I saw in June,  we weaned her off celexa because she felt it was causing persistent headache and nausea without vomiting, different from her migraines.  She did feel the celexa has helped "tremendously" with her anxiety but could not tolerate the side effects.  She returned to see me last month with increased panic, insomnia and anxiety.  Xanax was helping with panic attacks and headache and nausea had resolved off celexa.     Zoloft gave her HA in past. Also has tried paxil and it made her feel "crazy" and like she was in a tunnel.  We therefore tried lexapro- eRx was sent for lexapro 10 mg daily and she is here to follow this up today. Of note, she declined psych referral at that time.  Unfortunately she feels lexapro has been ineffective and is again worsening her nausea. Current Outpatient Prescriptions on File Prior to Visit  Medication Sig Dispense Refill  . ALPRAZolam (XANAX) 0.25 MG tablet Take 1 tablet (0.25 mg total) by mouth at bedtime as needed for anxiety. 30 tablet 0  . escitalopram (LEXAPRO) 10 MG tablet Take 1 tablet (10 mg total) by mouth at bedtime. 30 tablet 3  . Famotidine (ACID CONTROLLER PO) Take 1 tablet by mouth daily. [employer], Maximum Strength    . pantoprazole (PROTONIX) 40 MG tablet Take 1 tablet (40 mg total) by mouth daily. 90 tablet 3   No current facility-administered medications on file prior to visit.    Allergies  Allergen Reactions  . Topamax [Topiramate]     Body aches   . Hydrocodone Anxiety and Palpitations    Past Medical History  Diagnosis Date  . Anemia   . Anxiety   . GERD (gastroesophageal reflux disease)   . History of hysterectomy     still has both ovaries  . History of  migraine headaches   . PONV (postoperative nausea and vomiting)   . Depression   . Headache     migraines   . Colon polyp     Past Surgical History  Procedure Laterality Date  . Knee athroscopy  5/12    LT  . Laparoscopic vaginal hysterectomy  5/11    Supra cervical  . Wisdom tooth extraction  1995  . Knee arthroscopy    . Abdominal hysterectomy    . Colonoscopy    . Upper gi endoscopy    . Partial knee arthroplasty Left 02/10/2014    Procedure: LEFT MEDIAL UNICOMPARTMENTAL KNEE ARTHROPLASTY;  Surgeon: Gearlean Alf, MD;  Location: [facility];  Service: Orthopedics;  Laterality: Left;    Family History  Problem Relation Age of Onset  . Diabetes Father   . Stroke Father   . Heart attack Father   . Hyperlipidemia Mother   . Hypertension Mother   . Colon cancer Neg Hx   . Colon polyps Neg Hx   . Kidney disease Neg Hx   . Gallbladder disease Neg Hx   . Esophageal cancer Neg Hx     Social History   Social History  . Marital Status: Single    Spouse Name: N/A  . Number of Children: 1  . 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 Smoking status: Never Smoker   . Smokeless tobacco: Never Used  . Alcohol Use: 0.0 oz/week    0 Standard drinks or equivalent per week     Comment: occassional  . Drug Use: No  . Sexual Activity: No   Other Topics Concern  . Not on file   Social History Narrative   The PMH, PSH, Social History, Family History, Medications, and allergies have been reviewed in [facility], and have been updated if relevant.   Review of Systems  Constitutional: Negative.   Eyes: Negative.   Gastrointestinal: Positive for nausea. Negative for vomiting, diarrhea, constipation and rectal pain.  Endocrine: Negative.   Genitourinary: Negative.   Neurological: Negative.   Psychiatric/Behavioral: Positive for sleep disturbance, dysphoric mood and decreased concentration. Negative for suicidal  ideas, hallucinations, behavioral problems, confusion, self-injury and agitation. The patient is nervous/anxious. The patient is not hyperactive.   All other systems reviewed and are negative.      Objective:    BP 116/70 mmHg  Pulse 65  Temp(Src) 98.1 F (36.7 C) (Oral)  Wt 189 lb 12 oz (86.07 kg)  SpO2 98%  LMP 09/07/2009   Physical Exam  Constitutional: She is oriented to person, place, and time. She appears well-developed and well-nourished. No distress.  HENT:  Head: Normocephalic.  Eyes: Conjunctivae are normal.  Neck: Normal range of motion.  Cardiovascular: Normal rate.   Pulmonary/Chest: Effort normal.  Musculoskeletal: Normal range of motion.  Neurological: She is alert and oriented to person, place, and time. No cranial nerve deficit.  Skin: Skin is warm and dry.  Psychiatric: She has a normal mood and affect. Her behavior is normal. Judgment and thought content normal.  Nursing note and vitals reviewed.         Assessment & Plan:   Anxiety No Follow-up on file.

## 2014-12-16 NOTE — Progress Notes (Signed)
Pre visit review using our clinic review tool, if applicable. No additional management support is needed unless otherwise documented below in the visit note. 

## 2014-12-16 NOTE — Patient Instructions (Signed)
Good to see you. Please stop by to see Rosaria Ferries or Vaughan Basta on your way out.  Please wean off Lexapo- 10 mg every other day for 1- 2 weeks.

## 2014-12-16 NOTE — Assessment & Plan Note (Signed)
Deteriorated. >25 minutes spent in face to face time with patient, >50% spent in counselling or coordination of care Unfortunately, intolerant to lexapro as well. Refer to psychiatry for med management- urgently since she is having to miss work due to anxiety and side effects from antidepressants. Wean off lexapro- see AVS. The patient indicates understanding of these issues and agrees with the plan.

## 2015-01-02 ENCOUNTER — Telehealth: Payer: Self-pay

## 2015-01-02 ENCOUNTER — Encounter: Payer: Self-pay | Admitting: Primary Care

## 2015-01-02 ENCOUNTER — Ambulatory Visit (INDEPENDENT_AMBULATORY_CARE_PROVIDER_SITE_OTHER): Payer: 59 | Admitting: Primary Care

## 2015-01-02 ENCOUNTER — Ambulatory Visit: Payer: Self-pay

## 2015-01-02 VITALS — BP 122/80 | HR 68 | Temp 98.0°F | Ht 64.5 in | Wt 192.0 lb

## 2015-01-02 DIAGNOSIS — G43009 Migraine without aura, not intractable, without status migrainosus: Secondary | ICD-10-CM

## 2015-01-02 DIAGNOSIS — Z23 Encounter for immunization: Secondary | ICD-10-CM | POA: Diagnosis not present

## 2015-01-02 MED ORDER — METHYLPREDNISOLONE ACETATE 80 MG/ML IJ SUSP
80.0000 mg | Freq: Once | INTRAMUSCULAR | Status: AC
  Administered 2015-01-02: 80 mg via INTRAMUSCULAR

## 2015-01-02 MED ORDER — KETOROLAC TROMETHAMINE 60 MG/2ML IM SOLN
60.0000 mg | Freq: Once | INTRAMUSCULAR | Status: AC
  Administered 2015-01-02: 60 mg via INTRAMUSCULAR

## 2015-01-02 NOTE — Patient Instructions (Signed)
You've been provided with an injection of toradol and depo medrol for pain. When you get home take a promethazine tablet.  Follow up with Dr. Deborra Medina regarding preventative treatment for your migraines.  It was a pleasure meeting you!  Migraine Headache A migraine headache is an intense, throbbing pain on one or both sides of your head. A migraine can last for 30 minutes to several hours. CAUSES  The exact cause of a migraine headache is not always known. However, a migraine may be caused when nerves in the brain become irritated and release chemicals that cause inflammation. This causes pain. Certain things may also trigger migraines, such as:  Alcohol.  Smoking.  Stress.  Menstruation.  Aged cheeses.  Foods or drinks that contain nitrates, glutamate, aspartame, or tyramine.  Lack of sleep.  Chocolate.  Caffeine.  Hunger.  Physical exertion.  Fatigue.  Medicines used to treat chest pain (nitroglycerine), birth control pills, estrogen, and some blood pressure medicines. SIGNS AND SYMPTOMS  Pain on one or both sides of your head.  Pulsating or throbbing pain.  Severe pain that prevents daily activities.  Pain that is aggravated by any physical activity.  Nausea, vomiting, or both.  Dizziness.  Pain with exposure to bright lights, loud noises, or activity.  General sensitivity to bright lights, loud noises, or smells. Before you get a migraine, you may get warning signs that a migraine is coming (aura). An aura may include:  Seeing flashing lights.  Seeing bright spots, halos, or zigzag lines.  Having tunnel vision or blurred vision.  Having feelings of numbness or tingling.  Having trouble talking.  Having muscle weakness. DIAGNOSIS  A migraine headache is often diagnosed based on:  Symptoms.  Physical exam.  A CT scan or MRI of your head. These imaging tests cannot diagnose migraines, but they can help rule out other causes of  headaches. TREATMENT Medicines may be given for pain and nausea. Medicines can also be given to help prevent recurrent migraines.  HOME CARE INSTRUCTIONS  Only take over-the-counter or prescription medicines for pain or discomfort as directed by your health care provider. The use of long-term narcotics is not recommended.  Lie down in a dark, quiet room when you have a migraine.  Keep a journal to find out what may trigger your migraine headaches. For example, write down:  What you eat and drink.  How much sleep you get.  Any change to your diet or medicines.  Limit alcohol consumption.  Quit smoking if you smoke.  Get 7-9 hours of sleep, or as recommended by your health care provider.  Limit stress.  Keep lights dim if bright lights bother you and make your migraines worse. SEEK IMMEDIATE MEDICAL CARE IF:   Your migraine becomes severe.  You have a fever.  You have a stiff neck.  You have vision loss.  You have muscular weakness or loss of muscle control.  You start losing your balance or have trouble walking.  You feel faint or pass out.  You have severe symptoms that are different from your first symptoms. MAKE SURE YOU:   Understand these instructions.  Will watch your condition.  Will get help right away if you are not doing well or get worse. Document Released: 04/04/2005 Document Revised: 08/19/2013 Document Reviewed: 12/10/2012 [facility] Patient Information 2015 [place], [place]. This information is not intended to replace advice given to you by your health care provider. Make sure you discuss any questions you have with your health care  provider.

## 2015-01-02 NOTE — Progress Notes (Signed)
Subjective:    [name], female    DOB: 20-Apr-1970, 44 y.o.   MRN: [id number]  HPI  Patricia Bauer is a 44 year old female who presents today with a chief complaint of migraine. She woke up with her migraine at 5 am today. Her migraine is located to the left temporal side and occipital region. She is expereincing photophobia, phonophobia, nausea, and dizziness. She took 2 doses of excedrin migraine, last one at 9am. Neither doses were effective. She was once seeing neuro for migraines but could not continue due to cost. She's tried Imitrex and Topamax in the past without relief, and is working with her PCP for preventative treatment.   Review of Systems  Eyes: Positive for photophobia.  Respiratory: Negative for shortness of breath.   Cardiovascular: Negative for chest pain.  Neurological: Positive for dizziness and headaches. Negative for numbness.       Past Medical History  Diagnosis Date  . Anemia   . Anxiety   . GERD (gastroesophageal reflux disease)   . History of hysterectomy     still has both ovaries  . History of migraine headaches   . PONV (postoperative nausea and vomiting)   . Depression   . Headache     migraines   . Colon polyp     Social History   Social History  . Marital Status: Single    Spouse Name: N/A  . Number of Children: 1  . 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 Smoking status: Never Smoker   . Smokeless tobacco: Never Used  . Alcohol Use: 0.0 oz/week    0 Standard drinks or equivalent per week     Comment: occassional  . Drug Use: No  . Sexual Activity: No   Other Topics Concern  . Not on file   Social History Narrative    Past Surgical History  Procedure Laterality Date  . Knee athroscopy  5/12    LT  . Laparoscopic vaginal hysterectomy  5/11    Supra cervical  . Wisdom tooth extraction  1995  . Knee arthroscopy    . Abdominal  hysterectomy    . Colonoscopy    . Upper gi endoscopy    . Partial knee arthroplasty Left 02/10/2014    Procedure: LEFT MEDIAL UNICOMPARTMENTAL KNEE ARTHROPLASTY;  Surgeon: Gearlean Alf, MD;  Location: [facility];  Service: Orthopedics;  Laterality: Left;    Family History  Problem Relation Age of Onset  . Diabetes Father   . Stroke Father   . Heart attack Father   . Hyperlipidemia Mother   . Hypertension Mother   . Colon cancer Neg Hx   . Colon polyps Neg Hx   . Kidney disease Neg Hx   . Gallbladder disease Neg Hx   . Esophageal cancer Neg Hx     Allergies  Allergen Reactions  . Topamax [Topiramate]     Body aches   . Hydrocodone Anxiety and Palpitations    Current Outpatient Prescriptions on File Prior to Visit  Medication Sig Dispense Refill  . ALPRAZolam (XANAX) 0.25 MG tablet Take 1 tablet (0.25 mg total) by mouth at bedtime as needed for anxiety. 30 tablet 0  . Famotidine (ACID CONTROLLER PO) Take 1 tablet by mouth daily. [employer] Brand, Maximum Strength    . pantoprazole (PROTONIX) 40 MG tablet Take 1 tablet (40 mg total) by mouth daily. [place]  tablet 3   No current facility-administered medications on file prior to visit.    BP 122/80 mmHg  Pulse 68  Temp(Src) 98 F (36.7 C) (Oral)  Ht 5' 4.5" (1.638 m)  Wt 192 lb (87.091 kg)  BMI 32.46 kg/m2  SpO2 98%  LMP 09/07/2009     Objective:   Physical Exam  Constitutional: She is oriented to person, place, and time. She appears well-nourished.  Lights out and patient wearing sunglasses during exam.  Eyes: EOM are normal. Pupils are equal, round, and reactive to light.  Cardiovascular: Normal rate and regular rhythm.   Pulmonary/Chest: Effort normal and breath sounds normal.  Neurological: She is alert and oriented to person, place, and time. No cranial nerve deficit.          Assessment & Plan:

## 2015-01-02 NOTE — Progress Notes (Signed)
Pre visit review using our clinic review tool, if applicable. No additional management support is needed unless otherwise documented below in the visit note. 

## 2015-01-02 NOTE — Assessment & Plan Note (Signed)
Present since 5 am this morning. Located to left temporal and occipital region. No relief with excedrin migraine. IM of depo medrol and toradol provided today with instructions for her to take phenergan when she gets home (she is driving). Neuro exam unremarkable. Follow up with PCP to discuss management/prevention.

## 2015-01-02 NOTE — Telephone Encounter (Signed)
Pt walked in with migraine h/a; pt said woke up 5 Am with migraine and nausea. Hx of migraines; sensitive to light and sound. Pt scheduled for 1st available appt at [facility] today at 2 PM with Allie Bossier NP. Pt appreciated appt and advised pt if condition changes or worsens prior to appt pt could go to UC if needed.pt voiced understanding. Pt is driving and pt said she is OK to drive.

## 2015-01-05 ENCOUNTER — Other Ambulatory Visit: Payer: Self-pay | Admitting: Family Medicine

## 2015-01-06 NOTE — Telephone Encounter (Signed)
Rx called in to requested pharmacy 

## 2015-01-06 NOTE — Telephone Encounter (Signed)
Last f/u appt 11/2014 

## 2015-01-15 ENCOUNTER — Encounter (HOSPITAL_COMMUNITY): Payer: Self-pay | Admitting: Psychiatry

## 2015-01-15 ENCOUNTER — Ambulatory Visit (INDEPENDENT_AMBULATORY_CARE_PROVIDER_SITE_OTHER): Payer: 59 | Admitting: Psychiatry

## 2015-01-15 VITALS — BP 130/78 | HR 98 | Ht 64.5 in | Wt 192.4 lb

## 2015-01-15 DIAGNOSIS — F332 Major depressive disorder, recurrent severe without psychotic features: Secondary | ICD-10-CM | POA: Diagnosis not present

## 2015-01-15 DIAGNOSIS — F410 Panic disorder [episodic paroxysmal anxiety] without agoraphobia: Secondary | ICD-10-CM | POA: Diagnosis not present

## 2015-01-15 DIAGNOSIS — F411 Generalized anxiety disorder: Secondary | ICD-10-CM | POA: Insufficient documentation

## 2015-01-15 MED ORDER — MIRTAZAPINE 15 MG PO TABS: 15.0000 mg | ORAL_TABLET | Freq: Every day | ORAL | Status: DC
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
## 2015-01-19 ENCOUNTER — Telehealth: Payer: Self-pay

## 2015-01-19 NOTE — Telephone Encounter (Signed)
Form received and placed in Dr Hulen Shouts inbox for completion

## 2015-01-19 NOTE — Telephone Encounter (Signed)
I do not see this in my box 

## 2015-01-19 NOTE — Telephone Encounter (Signed)
Spoke to pt and informed her that per Dr Deborra Medina, she has not received paperwork for completion. Provided direct fax and pt states she will resend

## 2015-01-19 NOTE — Telephone Encounter (Signed)
Pt left v/m; pt faxed 2 sheets of appeal forms for biometric screening on 12/31/14 to be completed by Dr Deborra Medina; pt requested paperwork to be emailed back; pt request cb with status of paperwork.

## 2015-01-19 NOTE — Telephone Encounter (Signed)
To your knowledge, have you received this paperwork?

## 2015-01-21 NOTE — Telephone Encounter (Signed)
Lm on pts vm informing her paperwork is available for pickup from the front desk

## 2015-01-26 ENCOUNTER — Ambulatory Visit: Payer: Self-pay

## 2015-02-03 ENCOUNTER — Ambulatory Visit
Admission: RE | Admit: 2015-02-03 | Discharge: 2015-02-03 | Disposition: A | Discharge status: Home / Self Care | Payer: 59 | Source: Ambulatory Visit | Attending: Family Medicine | Admitting: Family Medicine

## 2015-02-03 DIAGNOSIS — Z1239 Encounter for other screening for malignant neoplasm of breast: Secondary | ICD-10-CM

## 2015-02-16 ENCOUNTER — Telehealth (HOSPITAL_COMMUNITY): Payer: Self-pay

## 2015-02-16 NOTE — Telephone Encounter (Signed)
Medication management - Telephone message left for pt. this nurse received her phone call requesting instruction from Dr. Doyne Keel assistance with plan to wein off of Remeron.  Agreed to send message to Dr. Doyne Keel but requested call back to discuss.

## 2015-02-19 NOTE — Telephone Encounter (Signed)
She can stop it right away. No known risk of withdrawal symptoms. We will discuss med options at her schedule appt on 11/29

## 2015-02-19 NOTE — Telephone Encounter (Signed)
Telephone call with patient to follow up on another message requesting a schedule to go off of Remeron.  Patient reported she had started taking a 1/2 pill and wanted to go off Remeron as she did not think it was helping that much and was affecting her weight.  Patient would like Dr. Doyne Keel advice to continue taper off medication.

## 2015-02-20 NOTE — Telephone Encounter (Signed)
Called patient to inform [name] her just stopping her Remeron and that she should not experience any withdrawal problems.  Informed she would discuss other options at evaluation set for 03/17/15 if she wanted to try anything else of if anything else needed and patient agreed with plan and instructions.

## 2015-03-04 ENCOUNTER — Encounter: Payer: Self-pay | Admitting: Family Medicine

## 2015-03-04 ENCOUNTER — Ambulatory Visit (INDEPENDENT_AMBULATORY_CARE_PROVIDER_SITE_OTHER): Payer: 59 | Admitting: Family Medicine

## 2015-03-04 VITALS — BP 120/80 | HR 75 | Temp 98.0°F | Wt 199.0 lb

## 2015-03-04 DIAGNOSIS — F332 Major depressive disorder, recurrent severe without psychotic features: Secondary | ICD-10-CM

## 2015-03-04 DIAGNOSIS — G43809 Other migraine, not intractable, without status migrainosus: Secondary | ICD-10-CM | POA: Diagnosis not present

## 2015-03-04 MED ORDER — AMITRIPTYLINE HCL 10 MG PO TABS: 10.0000 mg | ORAL_TABLET | Freq: Every day | ORAL | Status: DC

## 2015-03-04 NOTE — Progress Notes (Signed)
Subjective:   [name], female    DOB: 07-Sep-1970, 44 y.o.   MRN: [id number]  Patricia Bauer is a pleasant 44 y.o. year old female who presents to clinic today with discuss medications  on 03/04/2015  HPI:  Anxiety and depression-  Has had a difficult time with medications.  Most recently stopped Remeron [facility] as she felt it was not helping and was causing weight gain.  Note reviewed from 01/15/15- she has follow up appointment scheduled with Dr.Salina next week.  Prior to this, she had stopped lexapro, zoloft and celexa as both caused worsening headaches. She did feel the celexa has helped "tremendously" with her anxiety but could not tolerate the side effects.  Also has tried paxil and it made her feel "crazy" and like she was in a tunnel.    Xanax has been helping with panic attacks and nausea had resolved off SSRIs.  Unfortunately migraines have worsened.  Could not tolerate topamax in past- caused "all over body aches."       Current Outpatient Prescriptions on File Prior to Visit  Medication Sig Dispense Refill  . ALPRAZolam (XANAX) 0.25 MG tablet TAKE 1 TABLET BY MOUTH AT BEDTIME AS NEEDED FOR ANXIETY 30 tablet 0  . Famotidine (ACID CONTROLLER PO) Take 1 tablet by mouth daily. [employer] Brand, Maximum Strength    . pantoprazole (PROTONIX) 40 MG tablet Take 1 tablet (40 mg total) by mouth daily. 90 tablet 3   No current facility-administered medications on file prior to visit.    Allergies  Allergen Reactions  . Topamax [Topiramate]     Body aches   . Hydrocodone Anxiety and Palpitations    Past Medical History  Diagnosis Date  . Anemia   . Anxiety   . GERD (gastroesophageal reflux disease)   . History of hysterectomy     still has both ovaries  . History of migraine headaches   . PONV (postoperative nausea and vomiting)   . Depression   . Headache     migraines   . Colon polyp     Past Surgical  History  Procedure Laterality Date  . Knee athroscopy  5/12    LT  . Laparoscopic vaginal hysterectomy  5/11    Supra cervical  . Wisdom tooth extraction  1995  . Knee arthroscopy    . Abdominal hysterectomy    . Colonoscopy    . Upper gi endoscopy    . Partial knee arthroplasty Left 02/10/2014    Procedure: LEFT MEDIAL UNICOMPARTMENTAL KNEE ARTHROPLASTY;  Surgeon: Gearlean Alf, MD;  Location: [facility];  Service: Orthopedics;  Laterality: Left;  . Left partial knee replacement      Family History  Problem Relation Age of Onset  . Stroke Father   . Heart attack Father   . Hyperlipidemia Mother   . Hypertension Mother   . Colon cancer Neg Hx   . Colon polyps Neg Hx   . Kidney disease Neg Hx   . Gallbladder disease Neg Hx   . Esophageal cancer Neg Hx   . Alcohol abuse Neg Hx   . Drug abuse Neg Hx   . Depression Neg Hx   . Bipolar disorder Neg Hx   . Anxiety disorder Neg Hx   . Schizophrenia Neg Hx   . Suicidality Neg Hx     Social History   Social History  . Marital Status: Single    Spouse Name: N/A  .  Number of Children: 1  . 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 Smoking status: Never Smoker   . Smokeless tobacco: Never Used  . Alcohol Use: 0.0 oz/week    0 Standard drinks or equivalent per week     Comment: occassional- once a month has about 2-3 drinks  . Drug Use: No  . Sexual Activity: No   Other Topics Concern  . Not on file   Social History Narrative   The PMH, PSH, Social History, Family History, Medications, and allergies have been reviewed in [facility], and have been updated if relevant.   Review of Systems  Constitutional: Negative.   Eyes: Positive for photophobia.  Gastrointestinal: Negative for nausea, vomiting, diarrhea, constipation and rectal pain.  Endocrine: Negative.   Genitourinary: Negative.   Neurological: Positive for headaches.  Psychiatric/Behavioral:  Positive for sleep disturbance, dysphoric mood and decreased concentration. Negative for suicidal ideas, hallucinations, behavioral problems, confusion, self-injury and agitation. The patient is nervous/anxious. The patient is not hyperactive.   All other systems reviewed and are negative.      Objective:    BP 120/80 mmHg  Pulse 75  Temp(Src) 98 F (36.7 C) (Tympanic)  Wt 199 lb (90.266 kg)  SpO2 97%  LMP 09/07/2009   Physical Exam  Constitutional: She is oriented to person, place, and time. She appears well-developed and well-nourished. No distress.  HENT:  Head: Normocephalic.  Eyes: Conjunctivae are normal.  Neck: Normal range of motion.  Cardiovascular: Normal rate.   Pulmonary/Chest: Effort normal.  Musculoskeletal: Normal range of motion.  Neurological: She is alert and oriented to person, place, and time. No cranial nerve deficit.  Skin: Skin is warm and dry.  Psychiatric: She has a normal mood and affect. Her behavior is normal. Judgment and thought content normal.  Nursing note and vitals reviewed.         Assessment & Plan:   Other type of migraine without status migrainosus  Major depressive disorder, recurrent, severe without psychotic features ([place]) No Follow-up on file.

## 2015-03-04 NOTE — Assessment & Plan Note (Signed)
Keep follow up with psychiatry next week.

## 2015-03-04 NOTE — Assessment & Plan Note (Signed)
Deteriorated. >25 minutes spent in face to face time with patient, >50% spent in counselling or coordination of care Discussed tx options.  She would like to try elavil for prophyalxis  eRx sent. She will update me.

## 2015-03-04 NOTE — Progress Notes (Signed)
Pre visit review using our clinic review tool, if applicable. No additional management support is needed unless otherwise documented below in the visit note. 

## 2015-03-17 ENCOUNTER — Ambulatory Visit (INDEPENDENT_AMBULATORY_CARE_PROVIDER_SITE_OTHER): Payer: 59 | Admitting: Psychiatry

## 2015-03-17 ENCOUNTER — Encounter (HOSPITAL_COMMUNITY): Payer: Self-pay | Admitting: Psychiatry

## 2015-03-17 VITALS — BP 138/82 | HR 77 | Ht 64.5 in | Wt 198.5 lb

## 2015-03-17 DIAGNOSIS — F411 Generalized anxiety disorder: Secondary | ICD-10-CM

## 2015-03-17 DIAGNOSIS — F331 Major depressive disorder, recurrent, moderate: Secondary | ICD-10-CM | POA: Diagnosis not present

## 2015-03-17 DIAGNOSIS — F410 Panic disorder [episodic paroxysmal anxiety] without agoraphobia: Secondary | ICD-10-CM

## 2015-03-17 MED ORDER — AMITRIPTYLINE HCL 10 MG PO TABS: 20.0000 mg | ORAL_TABLET | Freq: Every day | ORAL | Status: DC
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
## 2015-03-20 ENCOUNTER — Encounter: Payer: Self-pay | Admitting: Family Medicine

## 2015-03-20 ENCOUNTER — Ambulatory Visit (INDEPENDENT_AMBULATORY_CARE_PROVIDER_SITE_OTHER): Payer: 59 | Admitting: Family Medicine

## 2015-03-20 ENCOUNTER — Other Ambulatory Visit (HOSPITAL_COMMUNITY): Payer: Self-pay | Admitting: Psychiatry

## 2015-03-20 VITALS — BP 110/70 | HR 74 | Temp 98.5°F | Ht 64.0 in | Wt 198.8 lb

## 2015-03-20 DIAGNOSIS — R6884 Jaw pain: Secondary | ICD-10-CM | POA: Insufficient documentation

## 2015-03-20 DIAGNOSIS — H539 Unspecified visual disturbance: Secondary | ICD-10-CM | POA: Diagnosis not present

## 2015-03-20 MED ORDER — PREDNISONE 10 MG PO TABS: ORAL_TABLET | ORAL | Status: DC

## 2015-03-20 NOTE — Assessment & Plan Note (Addendum)
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  NSAIDs contraindicated with pt history.  Will try course of low dose prednsione to help with inflammation and pain.   Doubt allergic reaction to elavil ( although facial swelling listed as SE) and I hesitate having her D/C it given dramatic benefit. IF not improving consider holding elavil. TMJ prevention reviewed as well.  No clear temporal pain, but recent headache, jaw pain and vision change in almost 44 year old.. Will check sed rate for temporal arteritis.

## 2015-03-20 NOTE — Patient Instructions (Addendum)
Call for eye MD appt to assess vision in right eye.  Start low dose prednisone taper for TMJ.  AVoid chewy foods, jaw use. Wear night guard for grinding of teeth.  Can consider holding elavil for possible allergic reaction.  Can use tylenol for pain. Calll if not imrpoving in next few weeks. Stop at lab on way out.

## 2015-03-20 NOTE — Assessment & Plan Note (Signed)
Eye exam nml today.  Refer to eye mD for further eval.

## 2015-03-20 NOTE — Progress Notes (Signed)
Subjective:    [name], female    DOB: [birth year], 44 y.o.   MRN: [id number]  HPI  44 year old female patient of Dr.Aron's with history of  GAD, migraine, depression, panic disorder presents with new onset right jaw pain and right facial swelling. Achy pain , worsening in right jaw, not tender to touch. Swelling started 1 week ago.  Pain over TMJ.  Pain with opening mouth, chewing.  wears night guard.Marland Kitchen Has had TMJ in past, but never this bad.  She has tried tylenoll.. No relief.   Does not chew gum.  No tooth pain. No cavities, no tooth injury.  Has also noted a new white spot ( like stared into light)  in vision in right eye.Bonne Dolores changing contacts. No eye pain. Last eye MD visit stable 6 months ago. Had migraine yesterday, one earlier in week.. Treated with excedrine migraine. Resolved.     She is wondering if it is a SE to Elavil. Started on 02/22/2015.   Review of Systems  Constitutional: Negative for fever and fatigue.  HENT: Negative for ear pain.   Eyes: Negative for pain.  Respiratory: Negative for chest tightness and shortness of breath.   Cardiovascular: Negative for chest pain, palpitations and leg swelling.  Gastrointestinal: Negative for abdominal pain.  Genitourinary: Negative for dysuria.       Objective:   Physical Exam  Constitutional: Vital signs are normal. She appears well-developed and well-nourished. She is cooperative.  Non-toxic appearance. She does not appear ill. No distress.  HENT:  Head: Normocephalic.  Right Ear: Hearing, tympanic membrane, external ear and ear canal normal. Tympanic membrane is not erythematous, not retracted and not bulging.  Left Ear: Hearing, tympanic membrane, external ear and ear canal normal. Tympanic membrane is not erythematous, not retracted and not bulging.  Nose: No mucosal edema or rhinorrhea. Right sinus exhibits no maxillary sinus tenderness and no frontal sinus tenderness. Left sinus  exhibits no maxillary sinus tenderness and no frontal sinus tenderness.  Mouth/Throat: Uvula is midline, oropharynx is clear and moist and mucous membranes are normal. No oropharyngeal exudate, posterior oropharyngeal edema, posterior oropharyngeal erythema or tonsillar abscesses.  Dentition nml, no gum pain.  Mild swelling in right mandible area, no parotid swelling, ttp over TMJ, pain with opening and closing jaw.  Eyes: Conjunctivae, EOM and lids are normal. Pupils are equal, round, and reactive to light. Lids are everted and swept, no foreign bodies found.  Fundoscopic exam:      The right eye shows no papilledema.       The left eye shows no papilledema.  Neck: Trachea normal and normal range of motion. Neck supple. Carotid bruit is not present. No thyroid mass and no thyromegaly present.  Cardiovascular: Normal rate, regular rhythm, S1 normal, S2 normal, normal heart sounds, intact distal pulses and normal pulses.  Exam reveals no gallop and no friction rub.   No murmur heard. Pulmonary/Chest: Effort normal and breath sounds normal. No tachypnea. No respiratory distress. She has no decreased breath sounds. She has no wheezes. She has no rhonchi. She has no rales.  Abdominal: Soft. Normal appearance and bowel sounds are normal. There is no tenderness.  Neurological: She is alert.  Skin: Skin is warm, dry and intact. No rash noted.  Psychiatric: Her speech is normal and behavior is normal. Judgment and thought content normal. Her mood appears not anxious. Cognition and memory are normal. She does not exhibit a depressed mood.  Assessment & Plan:

## 2015-03-20 NOTE — Addendum Note (Signed)
Addended by: Marchia Bond on: 03/20/2015 04:30 PM   Modules accepted: Orders

## 2015-03-20 NOTE — Progress Notes (Signed)
Pre visit review using our clinic review tool, if applicable. No additional management support is needed unless otherwise documented below in the visit note. 

## 2015-03-21 LAB — SEDIMENTATION RATE: Sed Rate: 23 mm/hr (ref 0–32)

## 2015-03-28 LAB — CYTOCHROME P450 2C9 GENOTYPING

## 2015-03-28 LAB — CYTOCHROME P450 2D6/2C19

## 2015-05-13 ENCOUNTER — Encounter: Payer: Self-pay | Admitting: Family Medicine

## 2015-05-13 ENCOUNTER — Telehealth (HOSPITAL_COMMUNITY): Payer: Self-pay

## 2015-05-13 ENCOUNTER — Ambulatory Visit (INDEPENDENT_AMBULATORY_CARE_PROVIDER_SITE_OTHER): Payer: 59 | Admitting: Family Medicine

## 2015-05-13 VITALS — BP 114/76 | HR 71 | Temp 97.2°F | Wt 202.8 lb

## 2015-05-13 DIAGNOSIS — R300 Dysuria: Secondary | ICD-10-CM

## 2015-05-13 LAB — POC URINALSYSI DIPSTICK (AUTOMATED)
Blood, UA: NEGATIVE
GLUCOSE UA: NEGATIVE
KETONES UA: NEGATIVE
LEUKOCYTES UA: NEGATIVE
Nitrite, UA: NEGATIVE
PROTEIN UA: NEGATIVE
Spec Grav, UA: 1.025
UROBILINOGEN UA: 0.2
pH, UA: 6.5

## 2015-05-13 MED ORDER — CIPROFLOXACIN HCL 500 MG PO TABS: 500.0000 mg | ORAL_TABLET | Freq: Two times a day (BID) | ORAL | Status: DC

## 2015-05-13 NOTE — Patient Instructions (Signed)
Good to see you. Please take cipro 500 mg twice daily x 3 days.  Call me if you want to pursue the ultrasound and or if your symptoms are not improving over next day or two.

## 2015-05-13 NOTE — Telephone Encounter (Signed)
I received a fax from [facility] for a refill of patients amtriptyline. Patient was last here on 11/29 and has a f/u on 05/19/2015. Will it be okay to refill? Please advise, thank you

## 2015-05-13 NOTE — Progress Notes (Signed)
Pre visit review using our clinic review tool, if applicable. No additional management support is needed unless otherwise documented below in the visit note. 

## 2015-05-13 NOTE — Progress Notes (Signed)
SUBJECTIVE: Patricia Bauer is a 45 y.o. female who complains of urinary frequency, urgency and dysuria x 4 days, without flank pain, fever, chills, or abnormal vaginal discharge or bleeding.   Remote h/o hysterectomy. She does still have both ovaries intact.  Current Outpatient Prescriptions on File Prior to Visit  Medication Sig Dispense Refill  . ALPRAZolam (XANAX) 0.25 MG tablet TAKE 1 TABLET BY MOUTH AT BEDTIME AS NEEDED FOR ANXIETY 30 tablet 0  . amitriptyline (ELAVIL) 10 MG tablet Take 2 tablets (20 mg total) by mouth at bedtime. 60 tablet 1  . Famotidine (ACID CONTROLLER PO) Take 1 tablet by mouth daily. [employer] Brand, Maximum Strength    . pantoprazole (PROTONIX) 40 MG tablet Take 1 tablet (40 mg total) by mouth daily. 90 tablet 3   No current facility-administered medications on file prior to visit.    Allergies  Allergen Reactions  . Topamax [Topiramate]     Body aches   . Hydrocodone Anxiety and Palpitations    Past Medical History  Diagnosis Date  . Anemia   . Anxiety   . GERD (gastroesophageal reflux disease)   . History of hysterectomy     still has both ovaries  . History of migraine headaches   . PONV (postoperative nausea and vomiting)   . Depression   . Headache     migraines   . Colon polyp     Past Surgical History  Procedure Laterality Date  . Knee athroscopy  5/12    LT  . Laparoscopic vaginal hysterectomy  5/11    Supra cervical  . Wisdom tooth extraction  1995  . Knee arthroscopy    . Abdominal hysterectomy    . Colonoscopy    . Upper gi endoscopy    . Partial knee arthroplasty Left 02/10/2014    Procedure: LEFT MEDIAL UNICOMPARTMENTAL KNEE ARTHROPLASTY;  Surgeon: Gearlean Alf, MD;  Location: [facility];  Service: Orthopedics;  Laterality: Left;  . Left partial knee replacement      Family History  Problem Relation Age of Onset  . Stroke Father   . Heart attack Father   . Hyperlipidemia Mother   . Hypertension Mother   . Colon cancer  Neg Hx   . Colon polyps Neg Hx   . Kidney disease Neg Hx   . Gallbladder disease Neg Hx   . Esophageal cancer Neg Hx   . Alcohol abuse Neg Hx   . Drug abuse Neg Hx   . Depression Neg Hx   . Bipolar disorder Neg Hx   . Anxiety disorder Neg Hx   . Schizophrenia Neg Hx   . Suicidality Neg Hx     Social History   Social History  . Marital Status: Single    Spouse Name: N/A  . Number of Children: 1  . 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 Smoking status: Never Smoker   . Smokeless tobacco: Never Used  . Alcohol Use: 0.0 oz/week    0 Standard drinks or equivalent per week     Comment: occassional- once a month has about 2-3 drinks  . Drug Use: No  . Sexual Activity: No   Other Topics Concern  . Not on file   Social History Narrative   The PMH, PSH, Social History, Family History, Medications, and allergies have been reviewed in [facility], and have been updated if relevant.  OBJECTIVE: BP 114/76 mmHg  Pulse  71  Temp(Src) 97.2 F (36.2 C) (Oral)  Wt 202 lb 12 oz (91.967 kg)  SpO2 99%  LMP 09/07/2009   Appears well, in no apparent distress.  Vital signs are normal. The abdomen is soft without tenderness, guarding, mass, rebound or organomegaly. No CVA tenderness or inguinal adenopathy noted. Urine dipstick shows negative for all   ASSESSMENT: UTI uncomplicated without evidence of pyelonephritis  PLAN: UA neg but symptoms classic for cystis. Treatment per orders - cipro 500 mg twice daily x 3 days. also push fluids, may use Pyridium OTC prn. Call or return to clinic prn if these symptoms worsen or fail to improve as anticipated.  Will pursue U/s of pelvis if symptoms persist. The patient indicates understanding of these issues and agrees with the plan.

## 2015-05-14 NOTE — Telephone Encounter (Signed)
We can give her 5 tabs to bridge her to the scheduled appt

## 2015-05-15 NOTE — Telephone Encounter (Signed)
I called and spoke to patient, she has enough to get to her appointment, the refill request was automatically generated by her pharmacy. I informed patient we would send refills to the pharmacy at her next visit.

## 2015-05-19 ENCOUNTER — Ambulatory Visit (HOSPITAL_COMMUNITY): Payer: Self-pay | Admitting: Psychiatry

## 2015-06-03 ENCOUNTER — Telehealth: Payer: Self-pay | Admitting: Family Medicine

## 2015-06-03 ENCOUNTER — Encounter: Payer: Self-pay | Admitting: Family Medicine

## 2015-06-03 ENCOUNTER — Ambulatory Visit (INDEPENDENT_AMBULATORY_CARE_PROVIDER_SITE_OTHER): Payer: 59 | Admitting: Family Medicine

## 2015-06-03 VITALS — BP 120/88 | HR 81 | Temp 98.6°F | Wt 202.0 lb

## 2015-06-03 DIAGNOSIS — R1032 Left lower quadrant pain: Secondary | ICD-10-CM | POA: Insufficient documentation

## 2015-06-03 NOTE — Progress Notes (Signed)
Subjective:   [name], female    DOB: 11-10-1970, 45 y.o.   MRN: [id number]  Patricia Bauer is a pleasant 45 y.o. year old female who presents to clinic today with Follow-up  on 06/03/2015  HPI:  I saw her on 05/03/15 for dysuria, increased urinary frequency and pelvic pain/supra pubic pressure.  Note reviewed.  Remote h/o hysterectomy. Does still have both ovaries intact.  UA neg but symptoms classic for cystitis so I treated her with 3 day course of cipro 500 mg twice daily.  Advised her to follow up or consider pelvic ultrasound if symptoms persisted.  She is here today because cipro did not help with the pain.  Pain now localized left lower quadrant.  Dysuria has resolved. Pain is constant, 8/10 now.  Worse with certain movements.  Feels similar to her discomfort with previous ovarian cyst. No changes in her bowel habits or blood in her stool. No nausea, vomiting or fever.   Current Outpatient Prescriptions on File Prior to Visit  Medication Sig Dispense Refill  . ALPRAZolam (XANAX) 0.25 MG tablet TAKE 1 TABLET BY MOUTH AT BEDTIME AS NEEDED FOR ANXIETY 30 tablet 0  . amitriptyline (ELAVIL) 10 MG tablet Take 2 tablets (20 mg total) by mouth at bedtime. 60 tablet 1  . pantoprazole (PROTONIX) 40 MG tablet Take 1 tablet (40 mg total) by mouth daily. 90 tablet 3   No current facility-administered medications on file prior to visit.    Allergies  Allergen Reactions  . Topamax [Topiramate]     Body aches   . Hydrocodone Anxiety and Palpitations    Past Medical History  Diagnosis Date  . Anemia   . Anxiety   . GERD (gastroesophageal reflux disease)   . History of hysterectomy     still has both ovaries  . History of migraine headaches   . PONV (postoperative nausea and vomiting)   . Depression   . Headache     migraines   . Colon polyp     Past Surgical History  Procedure Laterality Date  . Knee athroscopy  5/12    LT  . Laparoscopic  vaginal hysterectomy  5/11    Supra cervical  . Wisdom tooth extraction  1995  . Knee arthroscopy    . Abdominal hysterectomy    . Colonoscopy    . Upper gi endoscopy    . Partial knee arthroplasty Left 02/10/2014    Procedure: LEFT MEDIAL UNICOMPARTMENTAL KNEE ARTHROPLASTY;  Surgeon: Gearlean Alf, MD;  Location: [facility];  Service: Orthopedics;  Laterality: Left;  . Left partial knee replacement      Family History  Problem Relation Age of Onset  . Stroke Father   . Heart attack Father   . Hyperlipidemia Mother   . Hypertension Mother   . Colon cancer Neg Hx   . Colon polyps Neg Hx   . Kidney disease Neg Hx   . Gallbladder disease Neg Hx   . Esophageal cancer Neg Hx   . Alcohol abuse Neg Hx   . Drug abuse Neg Hx   . Depression Neg Hx   . Bipolar disorder Neg Hx   . Anxiety disorder Neg Hx   . Schizophrenia Neg Hx   . Suicidality Neg Hx     Social History   Social History  . Marital Status: Single    Spouse Name: N/A  . Number of Children: 1  . 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 Smoking status: Never Smoker   . Smokeless tobacco: Never Used  . Alcohol Use: 0.0 oz/week    0 Standard drinks or equivalent per week     Comment: occassional- once a month has about 2-3 drinks  . Drug Use: No  . Sexual Activity: No   Other Topics Concern  . Not on file   Social History Narrative   The PMH, PSH, Social History, Family History, Medications, and allergies have been reviewed in [facility], and have been updated if relevant.  Review of Systems  Constitutional: Negative.   Gastrointestinal: Positive for abdominal pain. Negative for nausea, vomiting, diarrhea, constipation, blood in stool and rectal pain.  Genitourinary: Positive for pelvic pain. Negative for dysuria, urgency, frequency, flank pain, decreased urine volume, vaginal bleeding, vaginal discharge and vaginal pain.  Musculoskeletal:  Negative.   Skin: Negative.   All other systems reviewed and are negative.      Objective:    BP 120/88 mmHg  Pulse 81  Temp(Src) 98.6 F (37 C) (Tympanic)  Wt 202 lb (91.627 kg)  SpO2 98%  LMP 09/07/2009   Physical Exam  Constitutional: She is oriented to person, place, and time. She appears well-developed and well-nourished. No distress.  HENT:  Head: Normocephalic and atraumatic.  Eyes: Conjunctivae are normal.  Cardiovascular: Normal rate.   Pulmonary/Chest: Effort normal.  Abdominal: Normal appearance and bowel sounds are normal. There is no hepatosplenomegaly, splenomegaly or hepatomegaly. There is tenderness in the left lower quadrant. There is guarding. There is no rigidity, no rebound, no CVA tenderness, no tenderness at McBurney's point and negative Murphy's sign.  Neurological: She is alert and oriented to person, place, and time. No cranial nerve deficit.  Skin: Skin is warm and dry. She is not diaphoretic.  Psychiatric: She has a normal mood and affect. Her behavior is normal. Judgment and thought content normal.  Nursing note and vitals reviewed.         Assessment & Plan:   Abdominal pain, left lower quadrant - Plan: US Pelvis Complete, US Transvaginal Non-OB No Follow-up on file.

## 2015-06-03 NOTE — Telephone Encounter (Signed)
Patient called to let you know that she is going to see her GYN Dr Quay Burow tomorrow. Their office can do the Transvaginal Pelvic US tomorrow at their office. Please cancel the orders in Epic and she will ask her GYN to send you her notes.

## 2015-06-03 NOTE — Telephone Encounter (Signed)
Noted.  Thank you. Orders canceled.

## 2015-06-03 NOTE — Assessment & Plan Note (Signed)
Deteriorated. [place] of abd/pelvis.  No longer seems urinary at this point.  Agreed could be ovarian source. The patient indicates understanding of these issues and agrees with the plan.

## 2015-06-03 NOTE — Addendum Note (Signed)
Addended by: Lucille Passy on: 06/03/2015 03:49 PM   Modules accepted: Orders

## 2015-06-03 NOTE — Progress Notes (Signed)
Pre visit review using our clinic review tool, if applicable. No additional management support is needed unless otherwise documented below in the visit note. 

## 2015-06-03 NOTE — Patient Instructions (Signed)
Great to see you. We will call you with your ultrasound appointment.

## 2015-06-07 ENCOUNTER — Other Ambulatory Visit (HOSPITAL_COMMUNITY): Payer: Self-pay | Admitting: Psychiatry

## 2015-06-08 ENCOUNTER — Emergency Department: Payer: 59

## 2015-06-08 ENCOUNTER — Telehealth (HOSPITAL_COMMUNITY): Payer: Self-pay

## 2015-06-08 ENCOUNTER — Emergency Department
Admission: EM | Admit: 2015-06-08 | Discharge: 2015-06-08 | Disposition: A | Discharge status: Home / Self Care | Payer: 59 | Attending: Emergency Medicine | Admitting: Emergency Medicine

## 2015-06-08 ENCOUNTER — Other Ambulatory Visit: Payer: Self-pay

## 2015-06-08 DIAGNOSIS — R102 Pelvic and perineal pain: Secondary | ICD-10-CM

## 2015-06-08 DIAGNOSIS — R1031 Right lower quadrant pain: Secondary | ICD-10-CM | POA: Insufficient documentation

## 2015-06-08 DIAGNOSIS — R1032 Left lower quadrant pain: Secondary | ICD-10-CM | POA: Diagnosis not present

## 2015-06-08 DIAGNOSIS — Z79899 Other long term (current) drug therapy: Secondary | ICD-10-CM | POA: Insufficient documentation

## 2015-06-08 LAB — CBC
HCT: 38.8 % (ref 35.0–47.0)
HEMOGLOBIN: 12.5 g/dL (ref 12.0–16.0)
MCH: 27.2 pg (ref 26.0–34.0)
MCHC: 32.2 g/dL (ref 32.0–36.0)
MCV: 84.3 fL (ref 80.0–100.0)
Platelets: 276 10*3/uL (ref 150–440)
RBC: 4.61 MIL/uL (ref 3.80–5.20)
RDW: 14.1 % (ref 11.5–14.5)
WBC: 8.5 10*3/uL (ref 3.6–11.0)

## 2015-06-08 LAB — BASIC METABOLIC PANEL
ANION GAP: 8 (ref 5–15)
BUN: 12 mg/dL (ref 6–20)
CHLORIDE: 107 mmol/L (ref 101–111)
CO2: 23 mmol/L (ref 22–32)
CREATININE: 0.85 mg/dL (ref 0.44–1.00)
Calcium: 8.4 mg/dL — ABNORMAL LOW (ref 8.9–10.3)
GFR calc non Af Amer: >60 mL/min (ref >60)
Glucose, Bld: 107 mg/dL — ABNORMAL HIGH (ref 65–99)
Potassium: 4.8 mmol/L (ref 3.5–5.1)
SODIUM: 138 mmol/L (ref 135–145)

## 2015-06-08 LAB — URINALYSIS COMPLETE WITH MICROSCOPIC (ARMC ONLY)
BILIRUBIN URINE: NEGATIVE
Bacteria, UA: NONE SEEN
Glucose, UA: NEGATIVE mg/dL
Ketones, ur: NEGATIVE mg/dL
LEUKOCYTES UA: NEGATIVE
NITRITE: NEGATIVE
PH: 7 (ref 5.0–8.0)
Protein, ur: NEGATIVE mg/dL
Specific Gravity, Urine: 1.006 (ref 1.005–1.030)

## 2015-06-08 MED ORDER — IOHEXOL 300 MG/ML  SOLN
100.0000 mL | Freq: Once | INTRAMUSCULAR | Status: AC | PRN
  Administered 2015-06-08: 100 mL via INTRAVENOUS

## 2015-06-08 MED ORDER — IOHEXOL 240 MG/ML SOLN
25.0000 mL | Freq: Once | INTRAMUSCULAR | Status: DC | PRN
  Administered 2015-06-08: 25 mL via ORAL
  Filled 2015-06-08: qty 50

## 2015-06-08 NOTE — ED Notes (Signed)
Pt refused POC, pt had hysterectomy in 2011, ultrasound notified

## 2015-06-08 NOTE — ED Notes (Signed)
Dr Dahlia Client notified u/s tech Butch Penny) requires POCT urine pregnancy prior to performing u/s; u/s aware pt with hx hysterectomy but st radiologist requires

## 2015-06-08 NOTE — Telephone Encounter (Signed)
Patient called and states that she will have to cancel her appointment tomorrow because she is in the ED, she needs a refill on her Elavil. Okay to refill? Please advise, thank you.

## 2015-06-08 NOTE — ED Notes (Signed)
Patient ambulatory to triage with steady gait, without difficulty or distress noted; pt reports pain "just above pubic bone" with some vaginal spotting; dx 3 left ovarian cysts on Friday

## 2015-06-08 NOTE — Discharge Instructions (Signed)
You were evaluated for pelvic pain and although no certain cause was found, your exam and evaluation are reassuring today in the emergency department.  We discussed your CT scan showed possible inflammation at the very tip of the appendix, however your symptoms are not consistent with appendicitis. Return to the emergency department for any worsening condition including fever, new or worsening abdominal pain especially in the right lower quadrant, or any other symptoms concerning to you.   Abdominal Pain, Adult Many things can cause belly (abdominal) pain. Most times, the belly pain is not dangerous. Many cases of belly pain can be watched and treated at home. HOME CARE   Do not take medicines that help you go poop (laxatives) unless told to by your doctor.  Only take medicine as told by your doctor.  Eat or drink as told by your doctor. Your doctor will tell you if you should be on a special diet. GET HELP IF:  You do not know what is causing your belly pain.  You have belly pain while you are sick to your stomach (nauseous) or have runny poop (diarrhea).  You have pain while you pee or poop.  Your belly pain wakes you up at night.  You have belly pain that gets worse or better when you eat.  You have belly pain that gets worse when you eat fatty foods.  You have a fever. GET HELP RIGHT AWAY IF:   The pain does not go away within 2 hours.  You keep throwing up (vomiting).  The pain changes and is only in the right or left part of the belly.  You have bloody or tarry looking poop. MAKE SURE YOU:   Understand these instructions.  Will watch your condition.  Will get help right away if you are not doing well or get worse.   This information is not intended to replace advice given to you by your health care provider. Make sure you discuss any questions you have with your health care provider.   Document Released: 09/21/2007 Document Revised: 04/25/2014 Document Reviewed:  12/12/2012 Elsevier Interactive Patient Education [employer].

## 2015-06-08 NOTE — ED Provider Notes (Signed)
[facility] Emergency Department Provider Note   ____________________________________________  Time seen: Approximately 8am I have reviewed the triage vital signs and the triage nursing note.  HISTORY  Chief Complaint Pelvic Pain   Historian Patient  HPI Patricia Bauer is a 45 y.o. female pelvic and suprapubic pain x 1 week.  Intermittent stabbing pain, severe at worst and aching at best.    Persistent pain.  Has seen ob gyn, dx with 3 left ovarian cysts on Thursday by tv US.  Taking tramadol for pain.  Completed cipro for possible uti by pcp.  No fevers.  She has had pain like this before due to ovarian cyst, but states the pain was usually more localized to one side or the other and now she is having some pain that is both lower quadrants as well as floor of the pelvis/suprapubic area. No particular dysuria or frequency of urination.    Past Medical History  Diagnosis Date  . Anemia   . Anxiety   . GERD (gastroesophageal reflux disease)   . History of hysterectomy     still has both ovaries  . History of migraine headaches   . PONV (postoperative nausea and vomiting)   . Depression   . Headache     migraines   . Colon polyp     Patient Active Problem List   Diagnosis Date Noted  . Abdominal pain, left lower quadrant 06/03/2015  . Pain in upper jaw 03/20/2015  . Change in vision 03/20/2015  . Major depressive disorder, recurrent, severe without psychotic features ([place]) 01/15/2015  . GAD (generalized anxiety disorder) 01/15/2015  . Panic disorder without agoraphobia 01/15/2015  . Family history of rheumatoid arthritis 11/12/2014  . Arthralgia 11/12/2014  . Hemorrhoid prolapse 11/09/2014  . Nausea without vomiting 10/14/2014  . Obesity 09/08/2014  . Intermittent abdominal pain 03/04/2014  . OA (osteoarthritis) of knee 02/10/2014  . Personal history of colonic polyps 10/16/2012  . History of headache 03/08/2012  . Breast mass, right  03/08/2012  . Vertigo 12/29/2011  . History of hysterectomy, supracervical 10/23/2011  . Anemia 09/08/2011  . GERD (gastroesophageal reflux disease) 09/08/2011  . Anxiety 09/08/2011  . Migraine headache 09/08/2011  . H/O: hysterectomy 09/08/2011  . Vaginal bleeding problems 09/08/2011    Past Surgical History  Procedure Laterality Date  . Knee athroscopy  5/12    LT  . Laparoscopic vaginal hysterectomy  5/11    Supra cervical  . Wisdom tooth extraction  1995  . Knee arthroscopy    . Abdominal hysterectomy    . Colonoscopy    . Upper gi endoscopy    . Partial knee arthroplasty Left 02/10/2014    Procedure: LEFT MEDIAL UNICOMPARTMENTAL KNEE ARTHROPLASTY;  Surgeon: Gearlean Alf, MD;  Location: [facility];  Service: Orthopedics;  Laterality: Left;  . Left partial knee replacement      Current Outpatient Rx  Name  Route  Sig  Dispense  Refill  . acetaminophen (TYLENOL) 500 MG tablet   Oral   Take 1,000 mg by mouth every 6 (six) hours as needed for mild pain or headache.         . ALPRAZolam (XANAX) 0.25 MG tablet   Oral   Take 0.25 mg by mouth at bedtime as needed for anxiety or sleep.         Marland Kitchen amitriptyline (ELAVIL) 10 MG tablet   Oral   Take 2 tablets (20 mg total) by mouth at bedtime. Patient taking differently: Take  10 mg by mouth at bedtime.    60 tablet   1   . pantoprazole (PROTONIX) 40 MG tablet   Oral   Take 1 tablet (40 mg total) by mouth daily.   90 tablet   3   . traMADol (ULTRAM) 50 MG tablet      50-100 mg every 6 (six) hours as needed for moderate pain.            Allergies Topamax and Hydrocodone  Family History  Problem Relation Age of Onset  . Stroke Father   . Heart attack Father   . Hyperlipidemia Mother   . Hypertension Mother   . Colon cancer Neg Hx   . Colon polyps Neg Hx   . Kidney disease Neg Hx   . Gallbladder disease Neg Hx   . Esophageal cancer Neg Hx   . Alcohol abuse Neg Hx   . Drug abuse Neg Hx   . Depression Neg  Hx   . Bipolar disorder Neg Hx   . Anxiety disorder Neg Hx   . Schizophrenia Neg Hx   . Suicidality Neg Hx     Social History Social History  Substance Use Topics  . Smoking status: Never Smoker   . Smokeless tobacco: Never Used  . Alcohol Use: 0.0 oz/week    0 Standard drinks or equivalent per week     Comment: occassional- once a month has about 2-3 drinks    Review of Systems  Constitutional: Negative for fever. Eyes: Negative for visual changes. ENT: Negative for sore throat. Cardiovascular: Negative for chest pain. Respiratory: Negative for shortness of breath. Gastrointestinal: Negative for vomiting and diarrhea. Genitourinary: Negative for dysuria. Musculoskeletal: Negative for back pain. Skin: Negative for rash. Neurological: Negative for headache. 10 point Review of Systems otherwise negative ____________________________________________   PHYSICAL EXAM:  VITAL SIGNS: ED Triage Vitals  Enc Vitals Group     BP 06/08/15 0454 141/94 mmHg     Pulse Rate 06/08/15 0454 91     Resp 06/08/15 0454 20     Temp 06/08/15 0454 98 F (36.7 C)     Temp Source 06/08/15 0454 Oral     SpO2 06/08/15 0454 99 %     Weight 06/08/15 0454 202 lb (91.627 kg)     Height 06/08/15 0454 5\' 4"  (1.626 m)     Head Cir --      Peak Flow --      Pain Score 06/08/15 0454 6     Pain Loc --      Pain Edu? --      Excl. in [place]? --      Constitutional: Alert and oriented. Well appearing and in no distress. HEENT   Head: Normocephalic and atraumatic.      Eyes: Conjunctivae are normal. PERRL. Normal extraocular movements.      Ears:         Nose: No congestion/rhinnorhea.   Mouth/Throat: Mucous membranes are moist.   Neck: No stridor. Cardiovascular/Chest: Normal rate, regular rhythm.  No murmurs, rubs, or gallops. Respiratory: Normal respiratory effort without tachypnea nor retractions. Breath sounds are clear and equal bilaterally. No  wheezes/rales/rhonchi. Gastrointestinal: Soft. No distention, no guarding, no rebound. Moderate tenderness, pelvic area - right and left lower quadrant and suprapubic.  Genitourinary/rectal:Deferred Musculoskeletal: Nontender with normal range of motion in all extremities. No joint effusions.  No lower extremity tenderness.  No edema. Neurologic:  Normal speech and language. No gross or focal neurologic deficits are appreciated.  Skin:  Skin is warm, dry and intact. No rash noted. Psychiatric: Mood and affect are normal. Speech and behavior are normal. Patient exhibits appropriate insight and judgment.  ____________________________________________   EKG I, Lisa Roca, MD, the attending physician have personally viewed and interpreted all ECGs.  none ____________________________________________  LABS (pertinent positives/negatives)  CBC within normal limits including white blood cell count of 8.5 Basic metabolic panel within normal limits Urinalysis negative  ____________________________________________  RADIOLOGY All Xrays were viewed by me. Imaging interpreted by Radiologist.  Ultrasound pelvic and transvaginal:  IMPRESSION: No acute abnormality seen to explain the patient's symptoms. Status post hysterectomy. Right ovary unremarkable in appearance. Left ovary not visualized on this study.   CT abdomen and pelvis with contrast:  IMPRESSION: Mild inflammatory changes in the region of the right ovary may be related to inflammation of the ovarian structure such as inflammatory changes of the dominant follicle/ corpus luteum in the right ovary. The tip of the appendix is located in the region of the right ovary. An early acute tip appendicitis is not excluded. Correlation with clinical exam is recommended. Delayed a CT images with opacification of the cecum may provide better evaluation of the appendix if there is high clinical concern for acute appendicitis. No drainable  fluid collection/abscess. __________________________________________  PROCEDURES  Procedure(s) performed: None  Critical Care performed: None  ____________________________________________   ED COURSE / ASSESSMENT AND PLAN  Pertinent labs & imaging results that were available during my care of the patient were reviewed by me and considered in my medical decision making (see chart for details).   This patient has a history of ovarian cysts, but the pain today is a little bit more diffuse or less localized. She was recently diagnosed by her OB/GYN in the office with a transvaginal ultrasound showing left-sided ovarian cysts.  Today her ultrasound shows hysterectomy, normal right ovary without torsion and nonvisualized left ovary. This makes me suspicious that perhaps the cyst ruptured and that's why she's having some discomfort down into the gravity dependent area of the pelvis.  Her labs are reassuring. Her vitals are reassuring. I was a little concerned about the amount of discomfort she had on palpation in the suprapubic right and left lower quadrant, and discussed my recommendation to obtain a CT to rule out other intra-abdominal emergencies including the possibility for something like diverticulitis or appendicitis. My suspicion of these is somewhat low given the diffuse lower pelvic pain, reassuring labs and vital signs, however I discussed risks versus benefit of CT and my recommendation to proceed. Patient would like to hold off and not do a CT. I think this is reasonable as she understands what symptoms to watch for and return to the ED for further evaluation if she gets worse.   ----------------------------------------- 1:45 PM on 06/08/2015 -----------------------------------------  I did discuss with patient's OB/GYN Dr. Georga Bora who felt like the patient's small 1 cm cyst on the left ovary were not likely controlled into the significant pain she had on Thursday and that  her pain was out of proportion to gynecologic cause. She was interested also in likely obtaining CT of the abdomen. I discussed this with the patient which is a proceed.  CT was essentially reassuring. The tiny possible appendiceal inflammation is not consistent with her clinical history of pain for for 5 days across the entire pelvic floor. I discussed this with the surgeon Dr. Azalee Course who looked at the CT and listened to the history and felt that she  would not be a candidate for an appendectomy at this point time with this history and exam. I discussed with the patient, and she feels comfortable returning home now. We discussed return precautions. She is reliable.    CONSULTATIONS:   Phone consultation with OB/GYN and surgeon.   Patient / Family / Caregiver informed of clinical course, medical decision-making process, and agree with plan.   I discussed return precautions, follow-up instructions, and discharged instructions with patient and/or family.   ___________________________________________   FINAL CLINICAL IMPRESSION(S) / ED DIAGNOSES   Final diagnoses:  Pelvic pain in female  Pelvic pain in female              Note: This dictation was prepared with Dragon dictation. Any transcriptional errors that result from this process are unintentional   Lisa Roca, MD 06/08/15 1347

## 2015-06-08 NOTE — ED Notes (Signed)
  Patient refuses pregnancy test

## 2015-06-08 NOTE — ED Notes (Signed)
Patient returned from CT

## 2015-06-09 ENCOUNTER — Ambulatory Visit (HOSPITAL_COMMUNITY): Payer: Self-pay | Admitting: Psychiatry

## 2015-06-09 ENCOUNTER — Other Ambulatory Visit: Payer: Self-pay

## 2015-06-09 DIAGNOSIS — F411 Generalized anxiety disorder: Secondary | ICD-10-CM

## 2015-06-09 DIAGNOSIS — F331 Major depressive disorder, recurrent, moderate: Secondary | ICD-10-CM

## 2015-06-09 DIAGNOSIS — F410 Panic disorder [episodic paroxysmal anxiety] without agoraphobia: Secondary | ICD-10-CM

## 2015-06-09 MED ORDER — AMITRIPTYLINE HCL 10 MG PO TABS: 10.0000 mg | ORAL_TABLET | Freq: Every day | ORAL | Status: DC

## 2015-06-09 NOTE — Telephone Encounter (Signed)
Yes we can give 30 day supply but pt needs to reschedule her appt

## 2015-06-09 NOTE — Telephone Encounter (Signed)
Pt request Dr Deborra Medina to refill amitriptyline to [facility]. Previously filled by Dr Doyne Keel with behavioral health; pt is not going back to Dr Doyne Keel because pt thought Dr Doyne Keel was rude and was not helping her anyway. Pt states presently taking amitriptyline 10 mg one tab po at hs. Pt request cb. Pt last seen 06/03/15.

## 2015-06-25 ENCOUNTER — Telehealth: Payer: Self-pay | Admitting: Family Medicine

## 2015-06-25 DIAGNOSIS — Z7689 Persons encountering health services in other specified circumstances: Secondary | ICD-10-CM

## 2015-06-25 NOTE — Telephone Encounter (Signed)
Signed and in my box. 

## 2015-06-25 NOTE — Telephone Encounter (Signed)
fmla paperwork from [facility] In dr aron's IN BOX For review and signature

## 2015-06-25 NOTE — Telephone Encounter (Signed)
Faxed paperwork  Left message letting pt know paperwork had been faxed and a copy was here for her  Copy for pt Copy for file Copy for scan Copy for billing

## 2015-07-10 ENCOUNTER — Other Ambulatory Visit: Payer: Self-pay | Admitting: Family Medicine

## 2015-07-28 ENCOUNTER — Ambulatory Visit (INDEPENDENT_AMBULATORY_CARE_PROVIDER_SITE_OTHER): Payer: 59 | Admitting: Family Medicine

## 2015-07-28 ENCOUNTER — Encounter: Payer: Self-pay | Admitting: Family Medicine

## 2015-07-28 VITALS — BP 116/76 | HR 76 | Temp 98.1°F | Wt 203.0 lb

## 2015-07-28 DIAGNOSIS — F332 Major depressive disorder, recurrent severe without psychotic features: Secondary | ICD-10-CM | POA: Diagnosis not present

## 2015-07-28 DIAGNOSIS — G43809 Other migraine, not intractable, without status migrainosus: Secondary | ICD-10-CM | POA: Diagnosis not present

## 2015-07-28 MED ORDER — CITALOPRAM HYDROBROMIDE 10 MG PO TABS: 10.0000 mg | ORAL_TABLET | Freq: Every day | ORAL | Status: DC

## 2015-07-28 NOTE — Progress Notes (Signed)
Subjective:   [name], female    DOB: [birth year], 45 y.o.   MRN: [id number]  Patricia Bauer is a pleasant 45 y.o. year old female who presents to clinic today with Follow-up  on 07/28/2015  HPI:  Anxiety and depression-  Has had a difficult time with medications.   No longer going to behavioral health.  "It wasn't a good fit." Intolerant to several rxs-  Stopped Remeron prescribed by Dr. Andria Frames at behavioral [facility]ath as she felt it was not helping and was causing weight gain.    Prior to this, she had stopped lexapro, zoloft and celexa as both caused worsening headaches. She did feel the celexa has helped "tremendously" with her anxiety but caused nausea.  Also has tried paxil and it made her feel "crazy" and like she was in a tunnel.  She is having worsening anxiety and depression.  Denies SI or HI.   Migraines- improved "significantly" with elavil but it is is causing daytime somnolence, even though she takes it at night.  Could not tolerate topamax in past- caused "all over body aches."       Current Outpatient Prescriptions on File Prior to Visit  Medication Sig Dispense Refill  . ALPRAZolam (XANAX) 0.25 MG tablet Take 0.25 mg by mouth at bedtime as needed for anxiety or sleep.    Marland Kitchen amitriptyline (ELAVIL) 10 MG tablet Take 1 tablet (10 mg total) by mouth at bedtime. 60 tablet 1  . pantoprazole (PROTONIX) 40 MG tablet Take 1 tablet by mouth  daily 90 tablet 2  . traMADol (ULTRAM) 50 MG tablet 50-100 mg every 6 (six) hours as needed for moderate pain.      No current facility-administered medications on file prior to visit.    Allergies  Allergen Reactions  . Diflucan [Fluconazole]     Pelvic pain  . Topamax [Topiramate] Other (See Comments)    Reaction:  Body aches    . Hydrocodone Anxiety, Palpitations and Other (See Comments)    Reaction:  Migraines     Past Medical History  Diagnosis Date  . Anemia   . Anxiety   . GERD  (gastroesophageal reflux disease)   . History of hysterectomy     still has both ovaries  . History of migraine headaches   . PONV (postoperative nausea and vomiting)   . Depression   . Headache     migraines   . Colon polyp     Past Surgical History  Procedure Laterality Date  . Knee athroscopy  5/12    LT  . Laparoscopic vaginal hysterectomy  5/11    Supra cervical  . Wisdom tooth extraction  1995  . Knee arthroscopy    . Abdominal hysterectomy    . Colonoscopy    . Upper gi endoscopy    . Partial knee arthroplasty Left 02/10/2014    Procedure: LEFT MEDIAL UNICOMPARTMENTAL KNEE ARTHROPLASTY;  Surgeon: Gearlean Alf, MD;  Location: [facility];  Service: Orthopedics;  Laterality: Left;  . Left partial knee replacement      Family History  Problem Relation Age of Onset  . Stroke Father   . Heart attack Father   . Hyperlipidemia Mother   . Hypertension Mother   . Colon cancer Neg Hx   . Colon polyps Neg Hx   . Kidney disease Neg Hx   . Gallbladder disease Neg Hx   . Esophageal cancer Neg Hx   . Alcohol abuse Neg Hx   .  Drug abuse Neg Hx   . Depression Neg Hx   . Bipolar disorder Neg Hx   . Anxiety disorder Neg Hx   . Schizophrenia Neg Hx   . Suicidality Neg Hx     Social History   Social History  . Marital Status: Single    Spouse Name: N/A  . Number of Children: 1  . 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 Smoking status: Never Smoker   . Smokeless tobacco: Never Used  . Alcohol Use: 0.0 oz/week    0 Standard drinks or equivalent per week     Comment: occassional- once a month has about 2-3 drinks  . Drug Use: No  . Sexual Activity: No   Other Topics Concern  . Not on file   Social History Narrative   The PMH, PSH, Social History, Family History, Medications, and allergies have been reviewed in [facility], and have been updated if relevant.   Review of Systems  Constitutional:  Positive for fatigue.  Eyes: Negative for photophobia.  Gastrointestinal: Negative for nausea, vomiting, diarrhea, constipation and rectal pain.  Endocrine: Negative.   Genitourinary: Negative.   Neurological: Negative for headaches.  Psychiatric/Behavioral: Positive for sleep disturbance and dysphoric mood. Negative for suicidal ideas, hallucinations, behavioral problems, confusion, self-injury, decreased concentration and agitation. The patient is not nervous/anxious and is not hyperactive.   All other systems reviewed and are negative.      Objective:    BP 116/76 mmHg  Pulse 76  Temp(Src) 98.1 F (36.7 C) (Oral)  Wt 203 lb (92.08 kg)  SpO2 99%  LMP 09/07/2009   Physical Exam  Constitutional: She is oriented to person, place, and time. She appears well-developed and well-nourished. No distress.  HENT:  Head: Normocephalic.  Eyes: Conjunctivae are normal.  Neck: Normal range of motion.  Cardiovascular: Normal rate.   Pulmonary/Chest: Effort normal.  Musculoskeletal: Normal range of motion.  Neurological: She is alert and oriented to person, place, and time. No cranial nerve deficit.  Skin: Skin is warm and dry.  Psychiatric: She has a normal mood and affect. Her behavior is normal. Judgment and thought content normal.  Nursing note and vitals reviewed.         Assessment & Plan:   Other type of migraine without status migrainosus  Major depressive disorder, recurrent, severe without psychotic features ([place]) No Follow-up on file.

## 2015-07-28 NOTE — Progress Notes (Deleted)
   Subjective:   [name], female    DOB: 1970-10-21, 45 y.o.   MRN: [id number]  Patricia Bauer is a pleasant 45 y.o. year old female who presents to clinic today with Follow-up  on 07/28/2015  HPI: ***  Review of Systems     Objective:    BP 116/76 mmHg  Pulse 76  Temp(Src) 98.1 F (36.7 C) (Oral)  Wt 203 lb (92.08 kg)  SpO2 99%  LMP 09/07/2009   Physical Exam        Assessment & Plan:   Other type of migraine without status migrainosus No Follow-up on file.

## 2015-07-28 NOTE — Assessment & Plan Note (Signed)
Elavil working well for preventative therapy but causing sedation.  Advised trying to take elavil every other day. The patient indicates understanding of these issues and agrees with the plan.

## 2015-07-28 NOTE — Progress Notes (Signed)
Pre visit review using our clinic review tool, if applicable. No additional management support is needed unless otherwise documented below in the visit note. 

## 2015-07-28 NOTE — Assessment & Plan Note (Signed)
Deteriorated. >25 minutes spent in face to face time with patient, >50% spent in counselling or coordination of care discussing anxiety, depression and migraines.  She would like to try celexa again as she felt it worked well for it- eRx sent for low dose, celexa 10 mg daily. She will keep me updated.

## 2015-07-28 NOTE — Patient Instructions (Signed)
Good to see you. Let's try taking the elavil every other day and start celexa 10 mg daily.  Update me in a few weeks.

## 2015-08-18 ENCOUNTER — Other Ambulatory Visit: Payer: Self-pay | Admitting: Family Medicine

## 2015-08-19 NOTE — Telephone Encounter (Signed)
Rx called in to requested pharmacy 

## 2015-08-19 NOTE — Telephone Encounter (Signed)
Pt reported not taking on 2/20 at [facility]. pls advise

## 2015-09-29 ENCOUNTER — Telehealth: Payer: Self-pay

## 2015-09-29 NOTE — Telephone Encounter (Signed)
She can try to go back to every day if she can tolerate the side effects.

## 2015-09-29 NOTE — Telephone Encounter (Signed)
Pt left v/m; pt last seen 07/28/15 and discussed to change Elavil to every other day; migraines are increasing and pt wants to know how should adjust the Elavil. Pt request cb.

## 2015-09-29 NOTE — Telephone Encounter (Signed)
Spoke to pt and advised per Dr Aron.  

## 2015-10-06 ENCOUNTER — Encounter: Payer: Self-pay | Admitting: Family Medicine

## 2015-10-26 ENCOUNTER — Encounter: Payer: Self-pay | Admitting: Family Medicine

## 2015-11-14 ENCOUNTER — Other Ambulatory Visit: Payer: Self-pay | Admitting: Family Medicine

## 2015-12-01 ENCOUNTER — Ambulatory Visit (INDEPENDENT_AMBULATORY_CARE_PROVIDER_SITE_OTHER): Payer: 59 | Admitting: Family Medicine

## 2015-12-01 ENCOUNTER — Encounter: Payer: Self-pay | Admitting: Family Medicine

## 2015-12-01 VITALS — BP 128/68 | HR 63 | Temp 98.2°F | Ht 64.25 in | Wt 193.5 lb

## 2015-12-01 DIAGNOSIS — Z0000 Encounter for general adult medical examination without abnormal findings: Secondary | ICD-10-CM

## 2015-12-01 DIAGNOSIS — F419 Anxiety disorder, unspecified: Secondary | ICD-10-CM | POA: Diagnosis not present

## 2015-12-01 DIAGNOSIS — Z01419 Encounter for gynecological examination (general) (routine) without abnormal findings: Secondary | ICD-10-CM

## 2015-12-01 DIAGNOSIS — G43809 Other migraine, not intractable, without status migrainosus: Secondary | ICD-10-CM

## 2015-12-01 DIAGNOSIS — K219 Gastro-esophageal reflux disease without esophagitis: Secondary | ICD-10-CM | POA: Diagnosis not present

## 2015-12-01 DIAGNOSIS — Z90710 Acquired absence of both cervix and uterus: Secondary | ICD-10-CM

## 2015-12-01 DIAGNOSIS — F332 Major depressive disorder, recurrent severe without psychotic features: Secondary | ICD-10-CM

## 2015-12-01 DIAGNOSIS — F411 Generalized anxiety disorder: Secondary | ICD-10-CM

## 2015-12-01 MED ORDER — SUMATRIPTAN SUCCINATE 25 MG PO TABS: 25.0000 mg | ORAL_TABLET | ORAL | 0 refills | Status: DC | PRN

## 2015-12-01 NOTE — Progress Notes (Signed)
Subjective:   [name], female    DOB: February 18, 1971, 45 y.o.   MRN: [id number]  Patricia Bauer is a pleasant 45 y.o. year old female who presents to clinic today with Annual Exam  on 12/01/2015  HPI:  Last mammogram 02/05/15 Saw GYN in 5/2-17.  S/p hysterectomy (partial).  No longer going to behavioral health.  "It wasn't a good fit." Intolerant to several rxs-  Stopped Remeron prescribed by Dr. Andria Frames at behavioral heath as she felt it was not helping and was causing weight gain.    Prior to this, she had stopped lexapro, zoloft and celexa as both caused worsening headaches. She did feel the celexa has helped "tremendously" with her anxiety but caused nausea so she restarted it when I last saw her on 07/28/15.  She is very pleased with this and it is not making her nauseated like it had in the past.    Migraines- improved "significantly" with elavil but it  caused daytime somnolence so she stopped taking it.  Couldn't tolerate topamax. Exercising and watching what she eats and only having 2 migraines per month.  Imitrex works for abortive therapy. Wt Readings from Last 3 Encounters:  12/01/15 193 lb 8 oz (87.8 kg)  07/28/15 203 lb (92.1 kg)  06/08/15 202 lb (91.6 kg)     Lab Results  Component Value Date   CHOL 201 (H) 07/23/2014   HDL 60 07/23/2014   LDLCALC 123 (H) 07/23/2014   TRIG 90 07/23/2014   CHOLHDL 3.4 07/23/2014   Lab Results  Component Value Date   CREATININE 0.85 06/08/2015   Lab Results  Component Value Date   WBC 8.5 06/08/2015   HGB 12.5 06/08/2015   HCT 38.8 06/08/2015   MCV 84.3 06/08/2015   PLT 276 06/08/2015   Lab Results  Component Value Date   TSH 2.110 07/23/2014   Lab Results  Component Value Date   NA 138 06/08/2015   K 4.8 06/08/2015   CL 107 06/08/2015   CO2 23 06/08/2015   Lab Results  Component Value Date   ALT 16 07/23/2014   AST 15 07/23/2014   ALKPHOS 76 07/23/2014   BILITOT <0.2 07/23/2014    Current Outpatient Prescriptions on File Prior to Visit  Medication Sig Dispense Refill  . ALPRAZolam (XANAX) 0.25 MG tablet TAKE 1 TABLET BY MOUTH AT BEDTIME AS NEEEDED FOR ANXIETY 30 tablet 0  . citalopram (CELEXA) 10 MG tablet TAKE 1 TABLET BY MOUTH EVERY DAY 90 tablet 1  . pantoprazole (PROTONIX) 40 MG tablet Take 1 tablet by mouth  daily 90 tablet 2  . traMADol (ULTRAM) 50 MG tablet 50-100 mg every 6 (six) hours as needed for moderate pain.      No current facility-administered medications on file prior to visit.     Allergies  Allergen Reactions  . Diflucan [Fluconazole]     Pelvic pain  . Topamax [Topiramate] Other (See Comments)    Reaction:  Body aches    . Hydrocodone Anxiety, Palpitations and Other (See Comments)    Reaction:  Migraines     Past Medical History:  Diagnosis Date  . Anemia   . Anxiety   . Colon polyp   . Depression   . GERD (gastroesophageal reflux disease)   . Headache    migraines   . History of hysterectomy    still has both ovaries  . History of migraine headaches   . PONV (postoperative nausea and vomiting)  Past Surgical History:  Procedure Laterality Date  . ABDOMINAL HYSTERECTOMY    . COLONOSCOPY    . KNEE ARTHROSCOPY    . knee athroscopy  5/12   LT  . LAPAROSCOPIC VAGINAL HYSTERECTOMY  5/11   Supra cervical  . left partial knee replacement    . PARTIAL KNEE ARTHROPLASTY Left 02/10/2014   Procedure: LEFT MEDIAL UNICOMPARTMENTAL KNEE ARTHROPLASTY;  Surgeon: Gearlean Alf, MD;  Location: [facility];  Service: Orthopedics;  Laterality: Left;  . UPPER GI ENDOSCOPY    . WISDOM TOOTH EXTRACTION  1995    Family History  Problem Relation Age of Onset  . Stroke Father   . Heart attack Father   . Hyperlipidemia Mother   . Hypertension Mother   . Colon cancer Neg Hx   . Colon polyps Neg Hx   . Kidney disease Neg Hx   . Gallbladder disease Neg Hx   . Esophageal cancer Neg Hx   . Alcohol abuse Neg Hx   . Drug abuse Neg Hx   .  Depression Neg Hx   . Bipolar disorder Neg Hx   . Anxiety disorder Neg Hx   . Schizophrenia Neg Hx   . Suicidality Neg Hx     Social History   Social History  . Marital status: Single    Spouse name: N/A  . Number of children: 1  . 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 Smoking status: Never Smoker  . Smokeless tobacco: Never Used  . Alcohol use 0.0 oz/week     Comment: occassional- once a month has about 2-3 drinks  . Drug use: No  . Sexual activity: No   Other Topics Concern  . Not on file   Social History Narrative  . No narrative on file   The PMH, PSH, Social History, Family History, Medications, and allergies have been reviewed in [facility], and have been updated if relevant.   Review of Systems  Constitutional: Negative.   Eyes: Negative.   Respiratory: Negative.   Cardiovascular: Negative.   Gastrointestinal: Negative for abdominal distention, abdominal pain, anal bleeding, blood in stool, constipation, diarrhea, nausea, rectal pain and vomiting.  Endocrine: Negative.   Allergic/Immunologic: Negative.   Neurological: Positive for headaches. Negative for dizziness and facial asymmetry.  Hematological: Negative.   Psychiatric/Behavioral: Negative.        Objective:    BP 128/68   Pulse 63   Temp 98.2 F (36.8 C) (Oral)   Ht 5' 4.25" (1.632 m)   Wt 193 lb 8 oz (87.8 kg)   LMP 09/07/2009   SpO2 99%   BMI 32.96 kg/m    Physical Exam   General:  Well-developed,well-nourished,in no acute distress; alert,appropriate and cooperative throughout examination Head:  normocephalic and atraumatic.   Eyes:  vision grossly intact, pupils equal, pupils round, and pupils reactive to light.   Ears:  R ear normal and L ear normal.   Nose:  no external deformity.   Mouth:  good dentition.   Neck:  No deformities, masses, or tenderness noted. Breasts:  No mass, nodules, thickening, tenderness,  bulging, retraction, inflamation, nipple discharge or skin changes noted.   Lungs:  Normal respiratory effort, chest expands symmetrically. Lungs are clear to auscultation, no crackles or wheezes. Heart:  Normal rate and regular rhythm. S1 and S2 normal without gallop, murmur, click, rub or other extra sounds. Abdomen:  Bowel sounds positive,abdomen soft and  non-tender without masses, organomegaly or hernias noted. Msk:  No deformity or scoliosis noted of thoracic or lumbar spine.   Extremities:  No clubbing, cyanosis, edema, or deformity noted with normal full range of motion of all joints.   Neurologic:  alert & oriented X3 and gait normal.   Skin:  Intact without suspicious lesions or rashes Cervical Nodes:  No lymphadenopathy noted Axillary Nodes:  No palpable lymphadenopathy Psych:  Cognition and judgment appear intact. Alert and cooperative with normal attention span and concentration. No apparent delusions, illusions, hallucinations     Assessment & Plan:   Gastroesophageal reflux disease, esophagitis presence not specified  H/O: hysterectomy  Anxiety  Major depressive disorder, recurrent, severe without psychotic features ([place])  GAD (generalized anxiety disorder) No Follow-up on file.

## 2015-12-01 NOTE — Progress Notes (Signed)
Pre visit review using our clinic review tool, if applicable. No additional management support is needed unless otherwise documented below in the visit note. 

## 2015-12-01 NOTE — Assessment & Plan Note (Signed)
Symptoms well controlled with low dose celexa. No changes made to rx today.

## 2015-12-01 NOTE — Assessment & Plan Note (Signed)
Feels she is doing ok without prophylaxis. eRx sent for imitrex to use for abortive therapy.

## 2015-12-01 NOTE — Assessment & Plan Note (Signed)
Reviewed preventive care protocols, scheduled due services, and updated immunizations Discussed nutrition, exercise, diet, and healthy lifestyle.  

## 2015-12-02 LAB — COMPREHENSIVE METABOLIC PANEL
A/G RATIO: 1.6 (ref 1.2–2.2)
ALK PHOS: 75 IU/L (ref 39–117)
ALT: 14 IU/L (ref 0–32)
AST: 16 IU/L (ref 0–40)
Albumin: 4.1 g/dL (ref 3.5–5.5)
BUN/Creatinine Ratio: 14 (ref 9–23)
BUN: 12 mg/dL (ref 6–24)
Bilirubin Total: <0.2 mg/dL (ref 0.0–1.2)
CO2: 22 mmol/L (ref 18–29)
Calcium: 8.8 mg/dL (ref 8.7–10.2)
Chloride: 100 mmol/L (ref 96–106)
Creatinine, Ser: 0.85 mg/dL (ref 0.57–1.00)
GFR calc Af Amer: 96 mL/min/{1.73_m2} (ref >59)
GFR calc non Af Amer: 84 mL/min/{1.73_m2} (ref >59)
GLOBULIN, TOTAL: 2.6 g/dL (ref 1.5–4.5)
Glucose: 89 mg/dL (ref 65–99)
POTASSIUM: 4.5 mmol/L (ref 3.5–5.2)
SODIUM: 140 mmol/L (ref 134–144)
Total Protein: 6.7 g/dL (ref 6.0–8.5)

## 2015-12-02 LAB — LIPID PANEL
CHOLESTEROL TOTAL: 164 mg/dL (ref 100–199)
Chol/HDL Ratio: 3.5 ratio units (ref 0.0–4.4)
HDL: 47 mg/dL (ref >39)
LDL Calculated: 97 mg/dL (ref 0–99)
TRIGLYCERIDES: 100 mg/dL (ref 0–149)
VLDL Cholesterol Cal: 20 mg/dL (ref 5–40)

## 2015-12-02 LAB — CBC WITH DIFFERENTIAL/PLATELET
BASOS ABS: 0 10*3/uL (ref 0.0–0.2)
BASOS: 0 %
EOS (ABSOLUTE): 0.1 10*3/uL (ref 0.0–0.4)
Eos: 1 %
HEMATOCRIT: 33.6 % — AB (ref 34.0–46.6)
Hemoglobin: 10.8 g/dL — ABNORMAL LOW (ref 11.1–15.9)
IMMATURE GRANULOCYTES: 0 %
Immature Grans (Abs): 0 10*3/uL (ref 0.0–0.1)
Lymphocytes Absolute: 2.7 10*3/uL (ref 0.7–3.1)
Lymphs: 35 %
MCH: 26.5 pg — ABNORMAL LOW (ref 26.6–33.0)
MCHC: 32.1 g/dL (ref 31.5–35.7)
MCV: 83 fL (ref 79–97)
MONOS ABS: 0.5 10*3/uL (ref 0.1–0.9)
Monocytes: 7 %
NEUTROS PCT: 57 %
Neutrophils Absolute: 4.4 10*3/uL (ref 1.4–7.0)
Platelets: 303 10*3/uL (ref 150–379)
RBC: 4.07 x10E6/uL (ref 3.77–5.28)
RDW: 15.3 % (ref 12.3–15.4)
WBC: 7.8 10*3/uL (ref 3.4–10.8)

## 2015-12-02 LAB — TSH: TSH: 1.49 u[IU]/mL (ref 0.450–4.500)

## 2015-12-07 ENCOUNTER — Encounter: Payer: Self-pay | Admitting: Family Medicine

## 2015-12-07 ENCOUNTER — Other Ambulatory Visit: Payer: Self-pay | Admitting: Family Medicine

## 2015-12-07 DIAGNOSIS — D649 Anemia, unspecified: Secondary | ICD-10-CM

## 2015-12-09 ENCOUNTER — Ambulatory Visit
Admission: RE | Admit: 2015-12-09 | Discharge: 2015-12-09 | Disposition: A | Discharge status: Home / Self Care | Payer: 59 | Source: Ambulatory Visit | Attending: Family Medicine | Admitting: Family Medicine

## 2015-12-09 ENCOUNTER — Ambulatory Visit (INDEPENDENT_AMBULATORY_CARE_PROVIDER_SITE_OTHER)
Admission: RE | Admit: 2015-12-09 | Discharge: 2015-12-09 | Disposition: A | Discharge status: Home / Self Care | Payer: 59 | Source: Ambulatory Visit | Attending: Family Medicine | Admitting: Family Medicine

## 2015-12-09 ENCOUNTER — Encounter: Payer: Self-pay | Admitting: Family Medicine

## 2015-12-09 ENCOUNTER — Ambulatory Visit (INDEPENDENT_AMBULATORY_CARE_PROVIDER_SITE_OTHER): Payer: 59 | Admitting: Family Medicine

## 2015-12-09 VITALS — BP 100/70 | HR 71 | Temp 98.5°F | Wt 191.0 lb

## 2015-12-09 DIAGNOSIS — R0781 Pleurodynia: Secondary | ICD-10-CM

## 2015-12-09 DIAGNOSIS — D649 Anemia, unspecified: Secondary | ICD-10-CM

## 2015-12-09 DIAGNOSIS — D62 Acute posthemorrhagic anemia: Secondary | ICD-10-CM | POA: Diagnosis not present

## 2015-12-09 NOTE — Assessment & Plan Note (Signed)
New- history and exam do seem consistent with rib fx although cause of fx unknown. Xray of ribs today. Advised scheduled tylenol with continued as needed tramadol.

## 2015-12-09 NOTE — Patient Instructions (Signed)
Great to see you. Take tylenol twice daily, tramadol for severe pain.  I will call you with your xray results.

## 2015-12-09 NOTE — Progress Notes (Signed)
Subjective:   [name], female    DOB: 31-Oct-1970, 45 y.o.   MRN: [id number]  Patricia Bauer is a pleasant 45 y.o. year old female who presents to clinic today with Pain (Left Rib Pain started 11-30-15)  on 12/09/2015  HPI:  Left sided pain- week of progressive left sided rib pain. TTP and pain with deep inspiration.  She has not been coughing prior to this.  She was taking martial arts class but does not remember a big hit on that side.  Tramadol helps a little with the pain.  Can't sleep at night because of the pain.   Current Outpatient Prescriptions on File Prior to Visit  Medication Sig Dispense Refill  . ALPRAZolam (XANAX) 0.25 MG tablet TAKE 1 TABLET BY MOUTH AT BEDTIME AS NEEEDED FOR ANXIETY 30 tablet 0  . citalopram (CELEXA) 10 MG tablet TAKE 1 TABLET BY MOUTH EVERY DAY 90 tablet 1  . pantoprazole (PROTONIX) 40 MG tablet Take 1 tablet by mouth  daily 90 tablet 2  . SUMAtriptan (IMITREX) 25 MG tablet Take 1 tablet (25 mg total) by mouth every 2 (two) hours as needed for migraine. 10 tablet 0  . traMADol (ULTRAM) 50 MG tablet 50-100 mg every 6 (six) hours as needed for moderate pain.      No current facility-administered medications on file prior to visit.     Allergies  Allergen Reactions  . Diflucan [Fluconazole]     Pelvic pain  . Topamax [Topiramate] Other (See Comments)    Reaction:  Body aches    . Hydrocodone Anxiety, Palpitations and Other (See Comments)    Reaction:  Migraines     Past Medical History:  Diagnosis Date  . Anemia   . Anxiety   . Colon polyp   . Depression   . GERD (gastroesophageal reflux disease)   . Headache    migraines   . History of hysterectomy    still has both ovaries  . History of migraine headaches   . PONV (postoperative nausea and vomiting)     Past Surgical History:  Procedure Laterality Date  . ABDOMINAL HYSTERECTOMY    . COLONOSCOPY    . KNEE ARTHROSCOPY    . knee athroscopy  5/12   LT    . LAPAROSCOPIC VAGINAL HYSTERECTOMY  5/11   Supra cervical  . left partial knee replacement    . PARTIAL KNEE ARTHROPLASTY Left 02/10/2014   Procedure: LEFT MEDIAL UNICOMPARTMENTAL KNEE ARTHROPLASTY;  Surgeon: Gearlean Alf, MD;  Location: [facility];  Service: Orthopedics;  Laterality: Left;  . UPPER GI ENDOSCOPY    . WISDOM TOOTH EXTRACTION  1995    Family History  Problem Relation Age of Onset  . Stroke Father   . Heart attack Father   . Hyperlipidemia Mother   . Hypertension Mother   . Colon cancer Neg Hx   . Colon polyps Neg Hx   . Kidney disease Neg Hx   . Gallbladder disease Neg Hx   . Esophageal cancer Neg Hx   . Alcohol abuse Neg Hx   . Drug abuse Neg Hx   . Depression Neg Hx   . Bipolar disorder Neg Hx   . Anxiety disorder Neg Hx   . Schizophrenia Neg Hx   . Suicidality Neg Hx     Social History   Social History  . Marital status: Single    Spouse name: N/A  . Number of children: 1  . 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 Smoking status: Never Smoker  . Smokeless tobacco: Never Used  . Alcohol use 0.0 oz/week     Comment: occassional- once a month has about 2-3 drinks  . Drug use: No  . Sexual activity: No   Other Topics Concern  . Not on file   Social History Narrative  . No narrative on file   The PMH, PSH, Social History, Family History, Medications, and allergies have been reviewed in [facility], and have been updated if relevant.   Review of Systems  Respiratory: Negative for cough and shortness of breath.   Cardiovascular: Positive for chest pain. Negative for palpitations and leg swelling.  Skin: Negative.   All other systems reviewed and are negative.      Objective:    BP 100/70 (BP Location: Left Arm, Patient Position: Sitting, Cuff Size: Normal)   Pulse 71   Temp 98.5 F (36.9 C) (Oral)   Wt 191 lb (86.6 kg)   LMP 09/07/2009   SpO2 98%   BMI 32.53 kg/m     Physical Exam  Constitutional: She appears well-developed and well-nourished. No distress.  HENT:  Head: Normocephalic.  Eyes: Conjunctivae are normal.  Cardiovascular: Normal rate.   Pulmonary/Chest: Effort normal.  Decreased breath sounds likely due from pain caused by deep inspiration Left inferior rib cage TTP  Skin: Skin is warm and dry. She is not diaphoretic.  Psychiatric: She has a normal mood and affect. Her behavior is normal. Judgment and thought content normal.  Nursing note and vitals reviewed.         Assessment & Plan:   Acute blood loss anemia  Rib pain on left side - Plan: DG Ribs Unilateral Left  Anemia, unspecified anemia type - Plan: CBC with Differential/Platelet, Ferritin, Vitamin B12 No Follow-up on file.

## 2015-12-10 LAB — CBC WITH DIFFERENTIAL/PLATELET
BASOS: 0 %
Basophils Absolute: 0 10*3/uL (ref 0.0–0.2)
EOS (ABSOLUTE): 0.1 10*3/uL (ref 0.0–0.4)
EOS: 1 %
HEMATOCRIT: 34.7 % (ref 34.0–46.6)
Hemoglobin: 11.4 g/dL (ref 11.1–15.9)
IMMATURE GRANS (ABS): 0 10*3/uL (ref 0.0–0.1)
IMMATURE GRANULOCYTES: 0 %
LYMPHS: 29 %
Lymphocytes Absolute: 2.1 10*3/uL (ref 0.7–3.1)
MCH: 27 pg (ref 26.6–33.0)
MCHC: 32.9 g/dL (ref 31.5–35.7)
MCV: 82 fL (ref 79–97)
MONOCYTES: 8 %
Monocytes Absolute: 0.6 10*3/uL (ref 0.1–0.9)
NEUTROS PCT: 62 %
Neutrophils Absolute: 4.4 10*3/uL (ref 1.4–7.0)
Platelets: 291 10*3/uL (ref 150–379)
RBC: 4.22 x10E6/uL (ref 3.77–5.28)
RDW: 14.8 % (ref 12.3–15.4)
WBC: 7.1 10*3/uL (ref 3.4–10.8)

## 2015-12-10 LAB — VITAMIN B12: VITAMIN B 12: 265 pg/mL (ref 211–946)

## 2015-12-10 LAB — FERRITIN: Ferritin: 9 ng/mL — ABNORMAL LOW (ref 15–150)

## 2015-12-11 ENCOUNTER — Other Ambulatory Visit: Payer: Self-pay | Admitting: Family Medicine

## 2015-12-11 ENCOUNTER — Encounter: Payer: Self-pay | Admitting: Family Medicine

## 2015-12-11 MED ORDER — FERROUS SULFATE 325 (65 FE) MG PO TABS: 325.0000 mg | ORAL_TABLET | Freq: Every day | ORAL | 3 refills | Status: DC

## 2015-12-23 ENCOUNTER — Other Ambulatory Visit: Payer: Self-pay | Admitting: *Deleted

## 2015-12-23 MED ORDER — CITALOPRAM HYDROBROMIDE 10 MG PO TABS: 10.0000 mg | ORAL_TABLET | Freq: Every day | ORAL | 1 refills | Status: DC

## 2016-01-10 ENCOUNTER — Other Ambulatory Visit: Payer: Self-pay | Admitting: Family Medicine

## 2016-01-11 NOTE — Telephone Encounter (Signed)
Last filled and last OV 12/01/15, ok to refill?

## 2016-01-27 ENCOUNTER — Telehealth: Payer: Self-pay | Admitting: Family Medicine

## 2016-01-27 NOTE — Telephone Encounter (Signed)
fmla paperwork faxed from [facility] In dr Deborra Medina in box For review and signature

## 2016-01-27 NOTE — Telephone Encounter (Signed)
Left message letting pt know paperwork has been faxed Copy for pt Copy for scan Copy for billing Copy for file

## 2016-01-29 ENCOUNTER — Ambulatory Visit (INDEPENDENT_AMBULATORY_CARE_PROVIDER_SITE_OTHER): Payer: 59

## 2016-01-29 DIAGNOSIS — Z23 Encounter for immunization: Secondary | ICD-10-CM | POA: Diagnosis not present

## 2016-01-29 DIAGNOSIS — Z7689 Persons encountering health services in other specified circumstances: Secondary | ICD-10-CM

## 2016-03-06 ENCOUNTER — Other Ambulatory Visit: Payer: Self-pay | Admitting: Family Medicine

## 2016-03-08 ENCOUNTER — Encounter: Payer: Self-pay | Admitting: Family Medicine

## 2016-03-08 ENCOUNTER — Ambulatory Visit (INDEPENDENT_AMBULATORY_CARE_PROVIDER_SITE_OTHER): Payer: 59 | Admitting: Family Medicine

## 2016-03-08 VITALS — BP 128/68 | HR 98 | Temp 98.0°F | Wt 190.5 lb

## 2016-03-08 DIAGNOSIS — M62838 Other muscle spasm: Secondary | ICD-10-CM | POA: Diagnosis not present

## 2016-03-08 MED ORDER — CYCLOBENZAPRINE HCL 10 MG PO TABS: 10.0000 mg | ORAL_TABLET | Freq: Three times a day (TID) | ORAL | 0 refills | Status: DC | PRN

## 2016-03-08 NOTE — Progress Notes (Signed)
SUBJECTIVE: Patricia Bauer is a 45 y.o. female who sustained a right shoulder injury 1 month(s) ago. Mechanism of injury: deep tissue massage. Immediate symptoms: immediate pain. Symptoms have been chronic since that time. Prior history of related problems: no prior problems with this area in the past.  Current Outpatient Prescriptions on File Prior to Visit  Medication Sig Dispense Refill  . ALPRAZolam (XANAX) 0.25 MG tablet TAKE 1 TABLET BY MOUTH AT BEDTIME AS NEEEDED FOR ANXIETY 30 tablet 0  . citalopram (CELEXA) 10 MG tablet Take 1 tablet (10 mg total) by mouth daily. 90 tablet 1  . ferrous sulfate 325 (65 FE) MG tablet Take 1 tablet (325 mg total) by mouth daily with breakfast. 30 tablet 3  . pantoprazole (PROTONIX) 40 MG tablet Take 1 tablet by mouth  daily 90 tablet 2  . SUMAtriptan (IMITREX) 25 MG tablet TAKE 1 TABLET (25 MG TOTAL) BY MOUTH EVERY 2 (TWO) HOURS AS NEEDED FOR MIGRAINE. 10 tablet 0  . traMADol (ULTRAM) 50 MG tablet 50-100 mg every 6 (six) hours as needed for moderate pain.      No current facility-administered medications on file prior to visit.     Allergies  Allergen Reactions  . Diflucan [Fluconazole]     Pelvic pain  . Topamax [Topiramate] Other (See Comments)    Reaction:  Body aches    . Hydrocodone Anxiety, Palpitations and Other (See Comments)    Reaction:  Migraines     Past Medical History:  Diagnosis Date  . Anemia   . Anxiety   . Colon polyp   . Depression   . GERD (gastroesophageal reflux disease)   . Headache    migraines   . History of hysterectomy    still has both ovaries  . History of migraine headaches   . PONV (postoperative nausea and vomiting)     Past Surgical History:  Procedure Laterality Date  . ABDOMINAL HYSTERECTOMY    . COLONOSCOPY    . KNEE ARTHROSCOPY    . knee athroscopy  5/12   LT  . LAPAROSCOPIC VAGINAL HYSTERECTOMY  5/11   Supra cervical  . left partial knee replacement    . PARTIAL KNEE ARTHROPLASTY Left  02/10/2014   Procedure: LEFT MEDIAL UNICOMPARTMENTAL KNEE ARTHROPLASTY;  Surgeon: Gearlean Alf, MD;  Location: [facility];  Service: Orthopedics;  Laterality: Left;  . UPPER GI ENDOSCOPY    . WISDOM TOOTH EXTRACTION  1995    Family History  Problem Relation Age of Onset  . Stroke Father   . Heart attack Father   . Hyperlipidemia Mother   . Hypertension Mother   . Colon cancer Neg Hx   . Colon polyps Neg Hx   . Kidney disease Neg Hx   . Gallbladder disease Neg Hx   . Esophageal cancer Neg Hx   . Alcohol abuse Neg Hx   . Drug abuse Neg Hx   . Depression Neg Hx   . Bipolar disorder Neg Hx   . Anxiety disorder Neg Hx   . Schizophrenia Neg Hx   . Suicidality Neg Hx     Social History   Social History  . Marital status: Single    Spouse name: N/A  . Number of children: 1  . 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 Smoking status: Never Smoker  . Smokeless tobacco: Never Used  . Alcohol use 0.0 oz/week  Comment: occassional- once a month has about 2-3 drinks  . Drug use: No  . Sexual activity: No   Other Topics Concern  . Not on file   Social History Narrative  . No narrative on file   The PMH, PSH, Social History, Family History, Medications, and allergies have been reviewed in [facility], and have been updated if relevant.  OBJECTIVE: BP 128/68   Pulse 98   Temp 98 F (36.7 C) (Oral)   Wt 190 lb 8 oz (86.4 kg)   LMP 09/07/2009   SpO2 97%   BMI 32.45 kg/m   Vital signs as noted above. Appearance: alert, well appearing, and in no distress and oriented to person, place, and time. Shoulder exam: normal exam, no swelling, tenderness, instability; ligaments intact, FROM shoulder joint but she does have pain and large palpable spasm in her right trapezius muscle.   ASSESSMENT: Trapezius spasm   PLAN: rest the injured area as much as practical, prescription for muscle relaxant See orders for  this visit as documented in the electronic medical record.

## 2016-03-08 NOTE — Patient Instructions (Signed)
Great to see you.  Try flexeril- please keep me updated.

## 2016-03-08 NOTE — Progress Notes (Signed)
Pre visit review using our clinic review tool, if applicable. No additional management support is needed unless otherwise documented below in the visit note. 

## 2016-03-16 ENCOUNTER — Other Ambulatory Visit: Payer: Self-pay | Admitting: Family Medicine

## 2016-03-17 NOTE — Telephone Encounter (Signed)
Last f/u 11/2015 

## 2016-03-17 NOTE — Telephone Encounter (Signed)
Ok to phone in Xanax 

## 2016-03-17 NOTE — Telephone Encounter (Signed)
Rx called into requested  

## 2016-04-04 ENCOUNTER — Encounter: Payer: Self-pay | Admitting: Family Medicine

## 2016-04-06 ENCOUNTER — Other Ambulatory Visit: Payer: Self-pay | Admitting: Family Medicine

## 2016-04-06 MED ORDER — CITALOPRAM HYDROBROMIDE 20 MG PO TABS: 20.0000 mg | ORAL_TABLET | Freq: Every day | ORAL | 3 refills | Status: DC

## 2016-05-02 ENCOUNTER — Other Ambulatory Visit: Payer: Self-pay | Admitting: Family Medicine

## 2016-05-02 DIAGNOSIS — M5414 Radiculopathy, thoracic region: Secondary | ICD-10-CM | POA: Diagnosis not present

## 2016-05-02 DIAGNOSIS — M545 Low back pain: Secondary | ICD-10-CM | POA: Diagnosis not present

## 2016-05-02 DIAGNOSIS — M5417 Radiculopathy, lumbosacral region: Secondary | ICD-10-CM | POA: Diagnosis not present

## 2016-05-05 DIAGNOSIS — M5417 Radiculopathy, lumbosacral region: Secondary | ICD-10-CM | POA: Diagnosis not present

## 2016-05-05 DIAGNOSIS — M545 Low back pain: Secondary | ICD-10-CM | POA: Diagnosis not present

## 2016-05-05 DIAGNOSIS — M5414 Radiculopathy, thoracic region: Secondary | ICD-10-CM | POA: Diagnosis not present

## 2016-05-10 DIAGNOSIS — M5417 Radiculopathy, lumbosacral region: Secondary | ICD-10-CM | POA: Diagnosis not present

## 2016-05-10 DIAGNOSIS — M5414 Radiculopathy, thoracic region: Secondary | ICD-10-CM | POA: Diagnosis not present

## 2016-05-10 DIAGNOSIS — M545 Low back pain: Secondary | ICD-10-CM | POA: Diagnosis not present

## 2016-05-18 ENCOUNTER — Other Ambulatory Visit: Payer: Self-pay | Admitting: Family Medicine

## 2016-05-18 ENCOUNTER — Encounter: Payer: Self-pay | Admitting: Family Medicine

## 2016-05-18 DIAGNOSIS — Z1231 Encounter for screening mammogram for malignant neoplasm of breast: Secondary | ICD-10-CM

## 2016-06-01 ENCOUNTER — Other Ambulatory Visit: Payer: Self-pay | Admitting: Family Medicine

## 2016-06-05 ENCOUNTER — Encounter: Payer: Self-pay | Admitting: Family Medicine

## 2016-06-09 ENCOUNTER — Ambulatory Visit (INDEPENDENT_AMBULATORY_CARE_PROVIDER_SITE_OTHER): Payer: 59 | Admitting: Family Medicine

## 2016-06-09 ENCOUNTER — Encounter: Payer: Self-pay | Admitting: Family Medicine

## 2016-06-09 VITALS — BP 120/76 | HR 75 | Temp 98.1°F | Wt 201.0 lb

## 2016-06-09 DIAGNOSIS — M778 Other enthesopathies, not elsewhere classified: Secondary | ICD-10-CM | POA: Diagnosis not present

## 2016-06-09 MED ORDER — PREDNISONE 20 MG PO TABS: 40.0000 mg | ORAL_TABLET | Freq: Every day | ORAL | 0 refills | Status: DC

## 2016-06-09 NOTE — Progress Notes (Signed)
Pre visit review using our clinic review tool, if applicable. No additional management support is needed unless otherwise documented below in the visit note. 

## 2016-06-09 NOTE — Patient Instructions (Signed)
De Quervain Disease Introduction De Quervain disease is inflammation of the tendon on the thumb side of the wrist. Tendons are cords of tissue that connect bones to muscles. The tendons in your hand pass through a tunnel, or sheath. A slippery layer of tissue (synovium) lets the tendons move smoothly in the sheath. With de Quervain disease, the sheath swells or thickens, causing friction and pain. The condition is also called de Quervain tendinosis and de Quervain syndrome. It occurs most often in women who are 30-50 years old. What are the causes? The exact cause of de Quervain disease is not known. It may result from:  Overusing your hands, especially with repetitive motions that involve twisting your hand or using a forceful grip.  Pregnancy.  Rheumatoid disease. What increases the risk? You may have a greater risk for de Quervain disease if you:  Are a middle-aged woman.  Are pregnant.  Have rheumatoid arthritis.  Have diabetes.  Use your hands far more than normal, especially with a tight grip or excessive twisting. What are the signs or symptoms? Pain on the thumb side of your wrist is the main symptom of de Quervain disease. Other signs and symptoms include:  Pain that gets worse when you grasp something or turn your wrist.  Pain that extends up the forearm.  Cysts in the area of the pain.  Swelling of your wrist and hand.  A sensation of snapping in the wrist.  Trouble moving the thumb and wrist. How is this diagnosed? Your health care provider may diagnose de Quervain disease based on your signs and symptoms. A physical exam will also be done. A simple test (Finkelstein test) that involves pulling your thumb and wrist to see if this causes pain can help determine whether you have the condition. Sometimes you may need to have an X-ray. How is this treated? Avoiding any activity that causes pain and swelling is the best treatment. Other options include:  Wearing a  splint.  Taking medicine. Anti-inflammatory medicines and corticosteroid injections may reduce inflammation and relieve pain.  Having surgery if other treatments do not work. Follow these instructions at home:  Using ice can be helpful after doing activities that involve the sore wrist. To apply ice to the injured area:  Put ice in a plastic bag.  Place a towel between your skin and the bag.  Leave the ice on for 20 minutes, 2-3 times a day.  Take medicines only as directed by your health care provider.  Wear your splint as directed. This will allow your hand to rest and heal. Contact a health care provider if:  Your pain medicine does not help.  Your pain gets worse.  You develop new symptoms. This information is not intended to replace advice given to you by your health care provider. Make sure you discuss any questions you have with your health care provider. Document Released: 12/28/2000 Document Revised: 09/10/2015 Document Reviewed: 08/07/2013  2017 Elsevier  

## 2016-06-10 NOTE — Progress Notes (Signed)
SUBJECTIVE: Patricia Bauer is a 46 y.o. female who complains of  right wrist pain 2 week(s) ago. Mechanism of injury: no known injury but has been doing [employer] several times per week. Immediate symptoms: delayed pain. Symptoms have been constant since that time. Prior history of related problems: no prior problems with this area in the past.  Current Outpatient Prescriptions on File Prior to Visit  Medication Sig Dispense Refill  . ALPRAZolam (XANAX) 0.25 MG tablet TAKE 1 TABLET BY MOUTH AT BEDTIME AS NEEDED 30 tablet 0  . citalopram (CELEXA) 20 MG tablet Take 1 tablet (20 mg total) by mouth daily. 30 tablet 3  . pantoprazole (PROTONIX) 40 MG tablet TAKE 1 TABLET BY MOUTH  DAILY 90 tablet 1  . traMADol (ULTRAM) 50 MG tablet 50-100 mg every 6 (six) hours as needed for moderate pain.      No current facility-administered medications on file prior to visit.     Allergies  Allergen Reactions  . Diflucan [Fluconazole]     Pelvic pain  . Topamax [Topiramate] Other (See Comments)    Reaction:  Body aches    . Hydrocodone Anxiety, Palpitations and Other (See Comments)    Reaction:  Migraines     Past Medical History:  Diagnosis Date  . Anemia   . Anxiety   . Colon polyp   . Depression   . GERD (gastroesophageal reflux disease)   . Headache    migraines   . History of hysterectomy    still has both ovaries  . History of migraine headaches   . PONV (postoperative nausea and vomiting)     Past Surgical History:  Procedure Laterality Date  . ABDOMINAL HYSTERECTOMY    . COLONOSCOPY    . KNEE ARTHROSCOPY    . knee athroscopy  5/12   LT  . LAPAROSCOPIC VAGINAL HYSTERECTOMY  5/11   Supra cervical  . left partial knee replacement    . PARTIAL KNEE ARTHROPLASTY Left 02/10/2014   Procedure: LEFT MEDIAL UNICOMPARTMENTAL KNEE ARTHROPLASTY;  Surgeon: Gearlean Alf, MD;  Location: [facility];  Service: Orthopedics;  Laterality: Left;  . UPPER GI ENDOSCOPY    . WISDOM TOOTH  EXTRACTION  1995    Family History  Problem Relation Age of Onset  . Stroke Father   . Heart attack Father   . Hyperlipidemia Mother   . Hypertension Mother   . Colon cancer Neg Hx   . Colon polyps Neg Hx   . Kidney disease Neg Hx   . Gallbladder disease Neg Hx   . Esophageal cancer Neg Hx   . Alcohol abuse Neg Hx   . Drug abuse Neg Hx   . Depression Neg Hx   . Bipolar disorder Neg Hx   . Anxiety disorder Neg Hx   . Schizophrenia Neg Hx   . Suicidality Neg Hx     Social History   Social History  . Marital status: Single    Spouse name: N/A  . Number of children: 1  . 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 Smoking status: Never Smoker  . Smokeless tobacco: Never Used  . Alcohol use 0.0 oz/week     Comment: occassional- once a month has about 2-3 drinks  . Drug use: No  . Sexual activity: No   Other Topics Concern  . Not on file   Social History Narrative  . No narrative on  file   The PMH, [place], Social History, Family History, Medications, and allergies have been reviewed in [facility], and have been updated if relevant.  OBJECTIVE: BP 120/76 (BP Location: Left Arm, Patient Position: Sitting, Cuff Size: Large)   Pulse 75   Temp 98.1 F (36.7 C) (Oral)   Wt 201 lb (91.2 kg)   LMP 09/07/2009   SpO2 97%   BMI 34.23 kg/m   Vital signs as noted above. Appearance: alert, well appearing, and in no distress and oriented to person, place, and time. Wrist exam: soft tissue tenderness and swelling and positive finkelsteins.  ASSESSMENT: wrist tendon injury  PLAN: rest the injured area as much as practical See orders for this visit as documented in the electronic medical record.

## 2016-06-14 ENCOUNTER — Ambulatory Visit
Admission: RE | Admit: 2016-06-14 | Discharge: 2016-06-14 | Disposition: A | Discharge status: Home / Self Care | Payer: 59 | Source: Ambulatory Visit | Attending: Family Medicine | Admitting: Family Medicine

## 2016-06-14 DIAGNOSIS — Z1231 Encounter for screening mammogram for malignant neoplasm of breast: Secondary | ICD-10-CM | POA: Diagnosis not present

## 2016-06-24 ENCOUNTER — Encounter: Payer: Self-pay | Admitting: Family Medicine

## 2016-07-20 ENCOUNTER — Telehealth: Payer: Self-pay | Admitting: Family Medicine

## 2016-07-20 NOTE — Telephone Encounter (Signed)
Left message asking pt to call office questions on fmla

## 2016-07-20 NOTE — Telephone Encounter (Signed)
Paperwork faxed °Pt aware °Copy for pt °Copy for file °Copy for scan °

## 2016-07-20 NOTE — Telephone Encounter (Signed)
Spoke to pt  fmla same as last year  Put in dr Deborra Medina IN BOX For review and signature

## 2016-07-26 ENCOUNTER — Encounter: Payer: Self-pay | Admitting: Family Medicine

## 2016-07-26 ENCOUNTER — Ambulatory Visit (INDEPENDENT_AMBULATORY_CARE_PROVIDER_SITE_OTHER): Payer: 59 | Admitting: Family Medicine

## 2016-07-26 VITALS — BP 118/80 | HR 71 | Wt 199.0 lb

## 2016-07-26 DIAGNOSIS — F411 Generalized anxiety disorder: Secondary | ICD-10-CM | POA: Diagnosis not present

## 2016-07-26 DIAGNOSIS — F331 Major depressive disorder, recurrent, moderate: Secondary | ICD-10-CM | POA: Diagnosis not present

## 2016-07-26 DIAGNOSIS — G8929 Other chronic pain: Secondary | ICD-10-CM

## 2016-07-26 DIAGNOSIS — F410 Panic disorder [episodic paroxysmal anxiety] without agoraphobia: Secondary | ICD-10-CM

## 2016-07-26 DIAGNOSIS — F332 Major depressive disorder, recurrent severe without psychotic features: Secondary | ICD-10-CM

## 2016-07-26 DIAGNOSIS — M25511 Pain in right shoulder: Secondary | ICD-10-CM | POA: Diagnosis not present

## 2016-07-26 DIAGNOSIS — G43809 Other migraine, not intractable, without status migrainosus: Secondary | ICD-10-CM

## 2016-07-26 MED ORDER — CITALOPRAM HYDROBROMIDE 20 MG PO TABS: 20.0000 mg | ORAL_TABLET | Freq: Every day | ORAL | 3 refills | Status: DC

## 2016-07-26 MED ORDER — AMITRIPTYLINE HCL 10 MG PO TABS: 10.0000 mg | ORAL_TABLET | Freq: Every day | ORAL | 1 refills | Status: DC

## 2016-07-26 NOTE — Assessment & Plan Note (Addendum)
Deteriorated. Keep a headache journal. Restart elavil for prophylaxis.

## 2016-07-26 NOTE — Progress Notes (Signed)
Subjective:   [name], female    DOB: 1970/08/09, 46 y.o.   MRN: [id number]  Zenita Kister is a pleasant 46 y.o. year old female who presents to clinic today with Shoulder Pain (Right shoulder x6 month); Depression; and migraines  on 07/26/2016  HPI:  Right shoulder pain- I saw her in 02/2016 for symptoms consistent with trapezius spasm- eRx sent for flexeril and I advised NSAIDs and massage.  She has done all of these conservative measures without much improvement.  Pain is a constant 2/10 pain sometimes worse at night. No difficulty raising her arms overhead.  No decreased grip strength of UE weakness.  Depression- symptoms are improved with celexa but she is noticing an increased appetite/weight gain.  She is trying to work on this. Wt Readings from Last 3 Encounters:  07/26/16 199 lb (90.3 kg)  06/09/16 201 lb (91.2 kg)  03/08/16 190 lb 8 oz (86.4 kg)   Migraines are worse again- having at least one per week- associated with photophobia/phonophobia and nausea without vomiting.    She would like to try elavil again.  Current Outpatient Prescriptions on File Prior to Visit  Medication Sig Dispense Refill  . ALPRAZolam (XANAX) 0.25 MG tablet TAKE 1 TABLET BY MOUTH AT BEDTIME AS NEEDED 30 tablet 0  . pantoprazole (PROTONIX) 40 MG tablet TAKE 1 TABLET BY MOUTH  DAILY 90 tablet 1  . traMADol (ULTRAM) 50 MG tablet 50-100 mg every 6 (six) hours as needed for moderate pain.      No current facility-administered medications on file prior to visit.     Allergies  Allergen Reactions  . Diflucan [Fluconazole]     Pelvic pain  . Topamax [Topiramate] Other (See Comments)    Reaction:  Body aches    . Hydrocodone Anxiety, Palpitations and Other (See Comments)    Reaction:  Migraines     Past Medical History:  Diagnosis Date  . Anemia   . Anxiety   . Colon polyp   . Depression   . GERD (gastroesophageal reflux disease)   . Headache    migraines   .  History of hysterectomy    still has both ovaries  . History of migraine headaches   . PONV (postoperative nausea and vomiting)     Past Surgical History:  Procedure Laterality Date  . ABDOMINAL HYSTERECTOMY    . COLONOSCOPY    . KNEE ARTHROSCOPY    . knee athroscopy  5/12   LT  . LAPAROSCOPIC VAGINAL HYSTERECTOMY  5/11   Supra cervical  . left partial knee replacement    . PARTIAL KNEE ARTHROPLASTY Left 02/10/2014   Procedure: LEFT MEDIAL UNICOMPARTMENTAL KNEE ARTHROPLASTY;  Surgeon: Gearlean Alf, MD;  Location: [facility];  Service: Orthopedics;  Laterality: Left;  . UPPER GI ENDOSCOPY    . WISDOM TOOTH EXTRACTION  1995    Family History  Problem Relation Age of Onset  . Stroke Father   . Heart attack Father   . Hyperlipidemia Mother   . Hypertension Mother   . Breast cancer Maternal Aunt   . Colon cancer Neg Hx   . Colon polyps Neg Hx   . Kidney disease Neg Hx   . Gallbladder disease Neg Hx   . Esophageal cancer Neg Hx   . Alcohol abuse Neg Hx   . Drug abuse Neg Hx   . Depression Neg Hx   . Bipolar disorder Neg Hx   . Anxiety disorder Neg Hx   .  Schizophrenia Neg Hx   . Suicidality Neg Hx     Social History   Social History  . Marital status: Single    Spouse name: N/A  . Number of children: 1  . 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 Smoking status: Never Smoker  . Smokeless tobacco: Never Used  . Alcohol use 0.0 oz/week     Comment: occassional- once a month has about 2-3 drinks  . Drug use: No  . Sexual activity: No   Other Topics Concern  . Not on file   Social History Narrative  . No narrative on file   The PMH, PSH, Social History, Family History, Medications, and allergies have been reviewed in [facility], and have been updated if relevant.   Review of Systems  Constitutional: Negative.   Respiratory: Negative.   Cardiovascular: Negative.   Gastrointestinal: Negative.     Musculoskeletal: Positive for arthralgias and myalgias.  Neurological: Negative.   Hematological: Negative.   Psychiatric/Behavioral: Negative.   All other systems reviewed and are negative.      Objective:    BP 118/80   Pulse 71   Wt 199 lb (90.3 kg)   LMP 09/07/2009   SpO2 98%   BMI 33.89 kg/m    Physical Exam  Constitutional: She is oriented to person, place, and time. She appears well-developed and well-nourished. No distress.  HENT:  Head: Normocephalic and atraumatic.  Eyes: Conjunctivae are normal.  Cardiovascular: Normal rate.   Pulmonary/Chest: Effort normal.  Musculoskeletal:       Right shoulder: She exhibits spasm. She exhibits normal range of motion, no tenderness, no bony tenderness, no swelling, no effusion, no crepitus, no laceration, no pain, normal pulse and normal strength.  Neurological: She is alert and oriented to person, place, and time. No cranial nerve deficit.  Skin: She is not diaphoretic.  Psychiatric: She has a normal mood and affect. Her behavior is normal. Judgment and thought content normal.  Nursing note and vitals reviewed.         Assessment & Plan:   Acute pain of right shoulder - Plan: Ambulatory referral to Orthopedic Surgery  Other migraine without status migrainosus, not intractable  Chronic right shoulder pain  MDD (major depressive disorder), recurrent episode, moderate (HCC) - Plan: amitriptyline (ELAVIL) 10 MG tablet  GAD (generalized anxiety disorder) - Plan: amitriptyline (ELAVIL) 10 MG tablet  Panic disorder without agoraphobia - Plan: amitriptyline (ELAVIL) 10 MG tablet  Major depressive disorder, recurrent, severe without psychotic features ([place]) No Follow-up on file.

## 2016-07-26 NOTE — Assessment & Plan Note (Signed)
Improved with celexa. Continue current dose. eRx refills sent.

## 2016-07-26 NOTE — Assessment & Plan Note (Signed)
Persistent. Still consistent with spasm but perhaps there is some internal derangement as well.  No signs of rotator cuff issues. Has failed conservative management. Refer to ortho. The patient indicates understanding of these issues and agrees with the plan.

## 2016-08-03 ENCOUNTER — Encounter: Payer: Self-pay | Admitting: Family Medicine

## 2016-08-04 DIAGNOSIS — M542 Cervicalgia: Secondary | ICD-10-CM | POA: Diagnosis not present

## 2016-08-04 DIAGNOSIS — M722 Plantar fascial fibromatosis: Secondary | ICD-10-CM | POA: Diagnosis not present

## 2016-08-04 DIAGNOSIS — M25511 Pain in right shoulder: Secondary | ICD-10-CM | POA: Diagnosis not present

## 2016-08-09 ENCOUNTER — Other Ambulatory Visit: Payer: Self-pay | Admitting: Family Medicine

## 2016-08-10 ENCOUNTER — Other Ambulatory Visit: Payer: Self-pay | Admitting: Family Medicine

## 2016-08-12 ENCOUNTER — Encounter: Payer: Self-pay | Admitting: Family Medicine

## 2016-08-12 MED ORDER — SUMATRIPTAN SUCCINATE 25 MG PO TABS: 25.0000 mg | ORAL_TABLET | ORAL | 0 refills | Status: DC | PRN

## 2016-08-17 DIAGNOSIS — M542 Cervicalgia: Secondary | ICD-10-CM | POA: Diagnosis not present

## 2016-08-23 DIAGNOSIS — M542 Cervicalgia: Secondary | ICD-10-CM | POA: Diagnosis not present

## 2016-08-25 ENCOUNTER — Encounter: Payer: Self-pay | Admitting: Family Medicine

## 2016-08-26 DIAGNOSIS — M542 Cervicalgia: Secondary | ICD-10-CM | POA: Diagnosis not present

## 2016-08-30 DIAGNOSIS — M542 Cervicalgia: Secondary | ICD-10-CM | POA: Diagnosis not present

## 2016-09-01 DIAGNOSIS — Z01419 Encounter for gynecological examination (general) (routine) without abnormal findings: Secondary | ICD-10-CM | POA: Diagnosis not present

## 2016-09-01 DIAGNOSIS — Z124 Encounter for screening for malignant neoplasm of cervix: Secondary | ICD-10-CM | POA: Diagnosis not present

## 2016-09-06 DIAGNOSIS — M542 Cervicalgia: Secondary | ICD-10-CM | POA: Diagnosis not present

## 2016-09-09 DIAGNOSIS — M542 Cervicalgia: Secondary | ICD-10-CM | POA: Diagnosis not present

## 2016-09-12 ENCOUNTER — Encounter: Payer: Self-pay | Admitting: Family Medicine

## 2016-09-17 DIAGNOSIS — M722 Plantar fascial fibromatosis: Secondary | ICD-10-CM | POA: Diagnosis not present

## 2016-09-17 DIAGNOSIS — M25511 Pain in right shoulder: Secondary | ICD-10-CM | POA: Diagnosis not present

## 2016-09-17 DIAGNOSIS — M542 Cervicalgia: Secondary | ICD-10-CM | POA: Diagnosis not present

## 2016-09-20 DIAGNOSIS — M542 Cervicalgia: Secondary | ICD-10-CM | POA: Diagnosis not present

## 2016-09-23 DIAGNOSIS — M542 Cervicalgia: Secondary | ICD-10-CM | POA: Diagnosis not present

## 2016-10-24 ENCOUNTER — Encounter: Payer: Self-pay | Admitting: Family Medicine

## 2016-11-13 ENCOUNTER — Other Ambulatory Visit: Payer: Self-pay | Admitting: Internal Medicine

## 2016-11-14 NOTE — Telephone Encounter (Signed)
Last filled 08/12/16 by [facility] in your absence... Please advise

## 2016-11-18 ENCOUNTER — Other Ambulatory Visit: Payer: Self-pay | Admitting: Family Medicine

## 2016-11-18 DIAGNOSIS — Z01419 Encounter for gynecological examination (general) (routine) without abnormal findings: Secondary | ICD-10-CM

## 2016-11-22 ENCOUNTER — Other Ambulatory Visit: Payer: Self-pay | Admitting: Family Medicine

## 2016-11-23 NOTE — Telephone Encounter (Signed)
Last refill 03/17/16 #30  Last OV 07/26/16  Ok to refill?

## 2016-11-23 NOTE — Telephone Encounter (Signed)
rx faxed to [id number] - [place], [place]: [phone]

## 2016-11-25 ENCOUNTER — Other Ambulatory Visit (INDEPENDENT_AMBULATORY_CARE_PROVIDER_SITE_OTHER): Payer: 59

## 2016-11-25 DIAGNOSIS — D649 Anemia, unspecified: Secondary | ICD-10-CM

## 2016-11-25 DIAGNOSIS — Z01419 Encounter for gynecological examination (general) (routine) without abnormal findings: Secondary | ICD-10-CM | POA: Diagnosis not present

## 2016-11-25 DIAGNOSIS — D519 Vitamin B12 deficiency anemia, unspecified: Secondary | ICD-10-CM

## 2016-11-25 NOTE — Addendum Note (Signed)
Addended by: Marchia Bond on: 11/25/2016 08:38 AM   Modules accepted: Orders

## 2016-11-26 LAB — CBC WITH DIFFERENTIAL/PLATELET
BASOS ABS: 0 10*3/uL (ref 0.0–0.2)
Basos: 0 %
EOS (ABSOLUTE): 0.1 10*3/uL (ref 0.0–0.4)
Eos: 1 %
Hematocrit: 35.4 % (ref 34.0–46.6)
Hemoglobin: 11.4 g/dL (ref 11.1–15.9)
IMMATURE GRANULOCYTES: 0 %
Immature Grans (Abs): 0 10*3/uL (ref 0.0–0.1)
LYMPHS: 37 %
Lymphocytes Absolute: 2.4 10*3/uL (ref 0.7–3.1)
MCH: 26.7 pg (ref 26.6–33.0)
MCHC: 32.2 g/dL (ref 31.5–35.7)
MCV: 83 fL (ref 79–97)
Monocytes Absolute: 0.5 10*3/uL (ref 0.1–0.9)
Monocytes: 8 %
NEUTROS PCT: 54 %
Neutrophils Absolute: 3.5 10*3/uL (ref 1.4–7.0)
PLATELETS: 340 10*3/uL (ref 150–379)
RBC: 4.27 x10E6/uL (ref 3.77–5.28)
RDW: 15.6 % — AB (ref 12.3–15.4)
WBC: 6.4 10*3/uL (ref 3.4–10.8)

## 2016-11-26 LAB — COMPREHENSIVE METABOLIC PANEL
ALT: 13 IU/L (ref 0–32)
AST: 12 IU/L (ref 0–40)
Albumin/Globulin Ratio: 1.5 (ref 1.2–2.2)
Albumin: 4 g/dL (ref 3.5–5.5)
Alkaline Phosphatase: 70 IU/L (ref 39–117)
BUN/Creatinine Ratio: 13 (ref 9–23)
BUN: 11 mg/dL (ref 6–24)
Bilirubin Total: <0.2 mg/dL (ref 0.0–1.2)
CALCIUM: 9 mg/dL (ref 8.7–10.2)
CO2: 24 mmol/L (ref 20–29)
CREATININE: 0.88 mg/dL (ref 0.57–1.00)
Chloride: 101 mmol/L (ref 96–106)
GFR, EST AFRICAN AMERICAN: 92 mL/min/{1.73_m2} (ref >59)
GFR, EST NON AFRICAN AMERICAN: 80 mL/min/{1.73_m2} (ref >59)
Globulin, Total: 2.6 g/dL (ref 1.5–4.5)
Glucose: 93 mg/dL (ref 65–99)
POTASSIUM: 5.2 mmol/L (ref 3.5–5.2)
Sodium: 139 mmol/L (ref 134–144)
TOTAL PROTEIN: 6.6 g/dL (ref 6.0–8.5)

## 2016-11-26 LAB — FERRITIN: FERRITIN: 10 ng/mL — AB (ref 15–150)

## 2016-11-26 LAB — TSH: TSH: 2.05 u[IU]/mL (ref 0.450–4.500)

## 2016-11-26 LAB — LIPID PANEL
CHOL/HDL RATIO: 3.7 ratio (ref 0.0–4.4)
CHOLESTEROL TOTAL: 187 mg/dL (ref 100–199)
HDL: 50 mg/dL (ref >39)
LDL Calculated: 117 mg/dL — ABNORMAL HIGH (ref 0–99)
TRIGLYCERIDES: 100 mg/dL (ref 0–149)
VLDL Cholesterol Cal: 20 mg/dL (ref 5–40)

## 2016-11-26 LAB — VITAMIN B12: Vitamin B-12: 370 pg/mL (ref 232–1245)

## 2016-12-01 ENCOUNTER — Ambulatory Visit (INDEPENDENT_AMBULATORY_CARE_PROVIDER_SITE_OTHER): Payer: 59 | Admitting: Family Medicine

## 2016-12-01 VITALS — BP 120/76 | HR 85 | Temp 98.7°F | Ht 64.25 in | Wt 211.0 lb

## 2016-12-01 DIAGNOSIS — F331 Major depressive disorder, recurrent, moderate: Secondary | ICD-10-CM | POA: Diagnosis not present

## 2016-12-01 DIAGNOSIS — G43809 Other migraine, not intractable, without status migrainosus: Secondary | ICD-10-CM | POA: Diagnosis not present

## 2016-12-01 DIAGNOSIS — F332 Major depressive disorder, recurrent severe without psychotic features: Secondary | ICD-10-CM | POA: Diagnosis not present

## 2016-12-01 DIAGNOSIS — F411 Generalized anxiety disorder: Secondary | ICD-10-CM | POA: Diagnosis not present

## 2016-12-01 DIAGNOSIS — Z01419 Encounter for gynecological examination (general) (routine) without abnormal findings: Secondary | ICD-10-CM

## 2016-12-01 DIAGNOSIS — F410 Panic disorder [episodic paroxysmal anxiety] without agoraphobia: Secondary | ICD-10-CM | POA: Diagnosis not present

## 2016-12-01 MED ORDER — AMITRIPTYLINE HCL 10 MG PO TABS: 10.0000 mg | ORAL_TABLET | Freq: Every day | ORAL | 1 refills | Status: DC

## 2016-12-01 NOTE — Assessment & Plan Note (Signed)
Deteriorated. eRx sent for elavil.   Call or return to clinic prn if these symptoms worsen or fail to improve as anticipated. The patient indicates understanding of these issues and agrees with the plan.

## 2016-12-01 NOTE — Assessment & Plan Note (Signed)
Reviewed preventive care protocols, scheduled due services, and updated immunizations Discussed nutrition, exercise, diet, and healthy lifestyle.  Form filled out and returned to pt.

## 2016-12-01 NOTE — Progress Notes (Signed)
Subjective:   [name], female    DOB: 05/29/1970, 46 y.o.   MRN: [id number]  Patricia Bauer is a pleasant 46 y.o. year old female who presents to clinic today with Annual Exam  on 12/01/2016  HPI:  Last mammogram 06/14/16 Saw GYN in 08/2016.  S/p hysterectomy (partial).  No longer going to behavioral health.  "It wasn't a good fit." Intolerant to several rxs-  Stopped Remeron prescribed by Dr. Andria Frames at behavioral heath as she felt it was not helping and was causing weight gain.   Currently taking Celexa 20 mg daily.    Migraines - deteriorated but she stopped taking Elavil 10 mg daily for prevention.  She is not sure why and would like to restart this.  She is taking  imitrex for abortive therapy.  Wt Readings from Last 3 Encounters:  12/01/16 211 lb (95.7 kg)  07/26/16 199 lb (90.3 kg)  06/09/16 201 lb (91.2 kg)     Lab Results  Component Value Date   CHOL 187 11/25/2016   HDL 50 11/25/2016   LDLCALC 117 (H) 11/25/2016   TRIG 100 11/25/2016   CHOLHDL 3.7 11/25/2016   Lab Results  Component Value Date   CREATININE 0.88 11/25/2016   Lab Results  Component Value Date   WBC 6.4 11/25/2016   HGB 11.4 11/25/2016   HCT 35.4 11/25/2016   MCV 83 11/25/2016   PLT 340 11/25/2016   Lab Results  Component Value Date   TSH 2.050 11/25/2016   Lab Results  Component Value Date   NA 139 11/25/2016   K 5.2 11/25/2016   CL 101 11/25/2016   CO2 24 11/25/2016   Lab Results  Component Value Date   ALT 13 11/25/2016   AST 12 11/25/2016   ALKPHOS 70 11/25/2016   BILITOT <0.2 11/25/2016   Current Outpatient Prescriptions on File Prior to Visit  Medication Sig Dispense Refill  . ALPRAZolam (XANAX) 0.25 MG tablet TAKE 1 TABLET BY MOUTH AT BEDTIME AS NEEDED 30 tablet 0  . citalopram (CELEXA) 20 MG tablet TAKE 1 TABLET (20 MG TOTAL) BY MOUTH DAILY. 30 tablet 5  . cyclobenzaprine (FLEXERIL) 10 MG tablet TAKE 1 TABLET (10 MG TOTAL) BY MOUTH 3 (THREE) TIMES  DAILY AS NEEDED FOR MUSCLE SPASMS. 30 tablet 0  . pantoprazole (PROTONIX) 40 MG tablet TAKE 1 TABLET BY MOUTH  DAILY 90 tablet 1  . SUMAtriptan (IMITREX) 25 MG tablet TAKE 1 TABLET BY MOUTH AS NEEDED FOR MIGRAINE. (MAY REPEAT IN 2 HOURS IF HEADACHE PERSISTS OR RECURS 10 tablet 0  . traMADol (ULTRAM) 50 MG tablet 50-100 mg every 6 (six) hours as needed for moderate pain.      No current facility-administered medications on file prior to visit.     Allergies  Allergen Reactions  . Diflucan [Fluconazole]     Pelvic pain  . Topamax [Topiramate] Other (See Comments)    Reaction:  Body aches    . Hydrocodone Anxiety, Palpitations and Other (See Comments)    Reaction:  Migraines     Past Medical History:  Diagnosis Date  . Anemia   . Anxiety   . Colon polyp   . Depression   . GERD (gastroesophageal reflux disease)   . Headache    migraines   . History of hysterectomy    still has both ovaries  . History of migraine headaches   . PONV (postoperative nausea and vomiting)     Past Surgical History:  Procedure Laterality Date  . ABDOMINAL HYSTERECTOMY    . COLONOSCOPY    . KNEE ARTHROSCOPY    . knee athroscopy  5/12   LT  . LAPAROSCOPIC VAGINAL HYSTERECTOMY  5/11   Supra cervical  . left partial knee replacement    . PARTIAL KNEE ARTHROPLASTY Left 02/10/2014   Procedure: LEFT MEDIAL UNICOMPARTMENTAL KNEE ARTHROPLASTY;  Surgeon: Gearlean Alf, MD;  Location: [facility];  Service: Orthopedics;  Laterality: Left;  . UPPER GI ENDOSCOPY    . WISDOM TOOTH EXTRACTION  1995    Family History  Problem Relation Age of Onset  . Stroke Father   . Heart attack Father   . Hyperlipidemia Mother   . Hypertension Mother   . Breast cancer Maternal Aunt   . Colon cancer Neg Hx   . Colon polyps Neg Hx   . Kidney disease Neg Hx   . Gallbladder disease Neg Hx   . Esophageal cancer Neg Hx   . Alcohol abuse Neg Hx   . Drug abuse Neg Hx   . Depression Neg Hx   . Bipolar disorder Neg Hx     . Anxiety disorder Neg Hx   . Schizophrenia Neg Hx   . Suicidality Neg Hx     Social History   Social History  . Marital status: Single    Spouse name: N/A  . Number of children: 1  . 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 Smoking status: Never Smoker  . Smokeless tobacco: Never Used  . Alcohol use 0.0 oz/week     Comment: occassional- once a month has about 2-3 drinks  . Drug use: No  . Sexual activity: No   Other Topics Concern  . Not on file   Social History Narrative  . No narrative on file   The PMH, PSH, Social History, Family History, Medications, and allergies have been reviewed in [facility], and have been updated if relevant.   Review of Systems  Constitutional: Positive for fatigue.  Eyes: Negative.   Respiratory: Negative.   Cardiovascular: Negative.   Gastrointestinal: Negative for abdominal distention, abdominal pain, anal bleeding, blood in stool, constipation, diarrhea, nausea, rectal pain and vomiting.  Endocrine: Negative.   Allergic/Immunologic: Negative.   Neurological: Positive for headaches. Negative for dizziness and facial asymmetry.  Hematological: Negative.   Psychiatric/Behavioral: Negative.   All other systems reviewed and are negative.      Objective:    BP 120/76   Pulse 85   Temp 98.7 F (37.1 C)   Ht 5' 4.25" (1.632 m)   Wt 211 lb (95.7 kg)   LMP 09/07/2009   SpO2 96%   BMI 35.94 kg/m    Physical Exam   General:  Well-developed,well-nourished,in no acute distress; alert,appropriate and cooperative throughout examination Head:  normocephalic and atraumatic.   Eyes:  vision grossly intact, pupils equal, pupils round, and pupils reactive to light.   Ears:  R ear normal and L ear normal.   Nose:  no external deformity.   Mouth:  good dentition.   Neck:  No deformities, masses, or tenderness noted. Breasts:  No mass, nodules, thickening, tenderness,  bulging, retraction, inflamation, nipple discharge or skin changes noted.   Lungs:  Normal respiratory effort, chest expands symmetrically. Lungs are clear to auscultation, no crackles or wheezes. Heart:  Normal rate and regular rhythm. S1 and S2 normal without gallop, murmur, click, rub or other  extra sounds. Abdomen:  Bowel sounds positive,abdomen soft and non-tender without masses, organomegaly or hernias noted. Msk:  No deformity or scoliosis noted of thoracic or lumbar spine.   Extremities:  No clubbing, cyanosis, edema, or deformity noted with normal full range of motion of all joints.   Neurologic:  alert & oriented X3 and gait normal.   Skin:  Intact without suspicious lesions or rashes Cervical Nodes:  No lymphadenopathy noted Axillary Nodes:  No palpable lymphadenopathy Psych:  Cognition and judgment appear intact. Alert and cooperative with normal attention span and concentration. No apparent delusions, illusions, hallucinations     Assessment & Plan:   Well woman exam  Major depressive disorder, recurrent, severe without psychotic features ([place])  Other migraine without status migrainosus, not intractable  MDD (major depressive disorder), recurrent episode, moderate ([place]) - Plan: amitriptyline (ELAVIL) 10 MG tablet  GAD (generalized anxiety disorder) - Plan: amitriptyline (ELAVIL) 10 MG tablet  Panic disorder without agoraphobia - Plan: amitriptyline (ELAVIL) 10 MG tablet No Follow-up on file.

## 2016-12-01 NOTE — Assessment & Plan Note (Signed)
Stable on current dose of celexa.

## 2016-12-08 ENCOUNTER — Other Ambulatory Visit: Payer: Self-pay | Admitting: Family Medicine

## 2017-01-03 ENCOUNTER — Encounter: Payer: Self-pay | Admitting: Family Medicine

## 2017-01-04 ENCOUNTER — Other Ambulatory Visit: Payer: Self-pay

## 2017-01-04 DIAGNOSIS — F331 Major depressive disorder, recurrent, moderate: Secondary | ICD-10-CM

## 2017-01-04 DIAGNOSIS — F410 Panic disorder [episodic paroxysmal anxiety] without agoraphobia: Secondary | ICD-10-CM

## 2017-01-04 DIAGNOSIS — F411 Generalized anxiety disorder: Secondary | ICD-10-CM

## 2017-01-04 MED ORDER — AMITRIPTYLINE HCL 10 MG PO TABS: 10.0000 mg | ORAL_TABLET | Freq: Every day | ORAL | 0 refills | Status: DC

## 2017-01-17 ENCOUNTER — Ambulatory Visit (INDEPENDENT_AMBULATORY_CARE_PROVIDER_SITE_OTHER): Payer: 59

## 2017-01-17 DIAGNOSIS — Z23 Encounter for immunization: Secondary | ICD-10-CM | POA: Diagnosis not present

## 2017-01-19 ENCOUNTER — Encounter: Payer: Self-pay | Admitting: Emergency Medicine

## 2017-01-19 ENCOUNTER — Emergency Department: Payer: 59

## 2017-01-19 ENCOUNTER — Encounter: Payer: Self-pay | Admitting: Family Medicine

## 2017-01-19 ENCOUNTER — Emergency Department
Admission: EM | Admit: 2017-01-19 | Discharge: 2017-01-19 | Disposition: A | Discharge status: Home / Self Care | Payer: 59 | Attending: Emergency Medicine | Admitting: Emergency Medicine

## 2017-01-19 ENCOUNTER — Telehealth: Payer: Self-pay | Admitting: Family Medicine

## 2017-01-19 DIAGNOSIS — Z5321 Procedure and treatment not carried out due to patient leaving prior to being seen by health care provider: Secondary | ICD-10-CM | POA: Diagnosis not present

## 2017-01-19 DIAGNOSIS — R1013 Epigastric pain: Secondary | ICD-10-CM | POA: Diagnosis present

## 2017-01-19 LAB — COMPREHENSIVE METABOLIC PANEL
ALK PHOS: 96 U/L (ref 38–126)
ALT: 56 U/L — ABNORMAL HIGH (ref 14–54)
ANION GAP: 8 (ref 5–15)
AST: 117 U/L — ABNORMAL HIGH (ref 15–41)
Albumin: 3.8 g/dL (ref 3.5–5.0)
BILIRUBIN TOTAL: 0.3 mg/dL (ref 0.3–1.2)
BUN: 14 mg/dL (ref 6–20)
CALCIUM: 9.2 mg/dL (ref 8.9–10.3)
CO2: 27 mmol/L (ref 22–32)
Chloride: 103 mmol/L (ref 101–111)
Creatinine, Ser: 1.07 mg/dL — ABNORMAL HIGH (ref 0.44–1.00)
Glucose, Bld: 101 mg/dL — ABNORMAL HIGH (ref 65–99)
Potassium: 4.3 mmol/L (ref 3.5–5.1)
SODIUM: 138 mmol/L (ref 135–145)
TOTAL PROTEIN: 7.4 g/dL (ref 6.5–8.1)

## 2017-01-19 LAB — TROPONIN I: Troponin I: <0.03 ng/mL (ref <0.03)

## 2017-01-19 LAB — URINALYSIS, COMPLETE (UACMP) WITH MICROSCOPIC
BILIRUBIN URINE: NEGATIVE
Glucose, UA: NEGATIVE mg/dL
Hgb urine dipstick: NEGATIVE
KETONES UR: NEGATIVE mg/dL
Leukocytes, UA: NEGATIVE
NITRITE: NEGATIVE
PH: 6 (ref 5.0–8.0)
Protein, ur: NEGATIVE mg/dL
RBC / HPF: NONE SEEN RBC/hpf (ref 0–5)
SPECIFIC GRAVITY, URINE: 1.011 (ref 1.005–1.030)

## 2017-01-19 LAB — CBC
HCT: 35.2 % (ref 35.0–47.0)
HEMOGLOBIN: 11.6 g/dL — AB (ref 12.0–16.0)
MCH: 26.1 pg (ref 26.0–34.0)
MCHC: 33 g/dL (ref 32.0–36.0)
MCV: 79.1 fL — ABNORMAL LOW (ref 80.0–100.0)
Platelets: 326 10*3/uL (ref 150–440)
RBC: 4.45 MIL/uL (ref 3.80–5.20)
RDW: 15.3 % — ABNORMAL HIGH (ref 11.5–14.5)
WBC: 9.2 10*3/uL (ref 3.6–11.0)

## 2017-01-19 LAB — LIPASE, BLOOD: Lipase: 32 U/L (ref 11–51)

## 2017-01-19 NOTE — Telephone Encounter (Signed)
I spoke with Patricia Bauer and she is leaving work now; Patricia Bauer still having upper abd pain (pain level 7) since 1:30. No N&V and no fever but Patricia Bauer said she was told she appears white in her facial color; I asked Patricia Bauer if I could call some one or 911 to drive her to ED and Patricia Bauer said no she could drive and she is going to ED now. FYI to Dr Deborra Medina.

## 2017-01-19 NOTE — ED Notes (Signed)
Pt called in ED lobby with no response

## 2017-01-19 NOTE — Telephone Encounter (Signed)
Patient Name: Patricia Bauer  DOB: 1970/04/24    Initial Comment Caller is having acute onset abdominal pain.    Nurse Assessment  Nurse: Raphael Gibney, RN, Vanita Ingles Date/Time (Eastern Time): 01/19/2017 2:25:55 PM  Confirm and document reason for call. If symptomatic, describe symptoms. ---Caller states she is having upper abd pain. Pain level 7-8. Pain started around 1:30 pm. Antacid did not help. Pain is taking her breath away.  Does the patient have any new or worsening symptoms? ---Yes  Will a triage be completed? ---Yes  Related visit to physician within the last 2 weeks? ---No  Does the PT have any chronic conditions? (i.e. diabetes, asthma, etc.) ---Yes  List chronic conditions. ---GERD  Is the patient pregnant or possibly pregnant? (Ask all females between the ages of 19-55) ---No  Is this a behavioral health or substance abuse call? ---No     Guidelines    Guideline Title Affirmed Question Affirmed Notes  Abdominal Pain - Upper [1] SEVERE pain (e.g., excruciating) AND [2] present > 1 hour    Final Disposition User   Go to ED Now Raphael Gibney, RN, Vanita Ingles    Comments  pt states she is not going to the ER now but will wait 30 min and if pain does not subside, she will go to the ER  Called back line and spoke to [facility] and gave report that pt is having upper abd pain right under the sternum for about an hr. Pain was causing her to catch her breath. Has acid reflux and took an antacid which made it worse. Triage outcome of to to ER now. Pt states she will wait 30 min to see if pain subsides and will go to the ER then if pain does not subside.   Referrals  GO TO FACILITY REFUSED   Caller Disagree/Comply Disagree  Caller Understands Yes  PreDisposition Call Doctor

## 2017-01-19 NOTE — ED Triage Notes (Signed)
Patient presents to ED via POV from home with c/o epigastric abdominal pain. Denies N/V/D. Patient reports sudden onset of the pain around 1330 while he was at work, sitting at her desk.

## 2017-02-01 ENCOUNTER — Other Ambulatory Visit: Payer: Self-pay | Admitting: Family Medicine

## 2017-02-01 DIAGNOSIS — R7989 Other specified abnormal findings of blood chemistry: Secondary | ICD-10-CM

## 2017-02-01 DIAGNOSIS — R945 Abnormal results of liver function studies: Principal | ICD-10-CM

## 2017-02-09 ENCOUNTER — Telehealth: Payer: Self-pay | Admitting: Family Medicine

## 2017-02-09 DIAGNOSIS — Z7689 Persons encountering health services in other specified circumstances: Secondary | ICD-10-CM

## 2017-02-09 NOTE — Telephone Encounter (Signed)
Form signed and in my box. 

## 2017-02-09 NOTE — Telephone Encounter (Signed)
Pt faxed over FMLA paperwork to be reviewed, completed and signed.  Form in Dr. Deborra Medina INbox  Please return to [place].

## 2017-02-10 ENCOUNTER — Other Ambulatory Visit (INDEPENDENT_AMBULATORY_CARE_PROVIDER_SITE_OTHER): Payer: 59

## 2017-02-10 DIAGNOSIS — R945 Abnormal results of liver function studies: Secondary | ICD-10-CM | POA: Diagnosis not present

## 2017-02-10 DIAGNOSIS — R7989 Other specified abnormal findings of blood chemistry: Secondary | ICD-10-CM

## 2017-02-10 NOTE — Addendum Note (Signed)
Addended by: Ellamae Sia on: 02/10/2017 12:16 PM   Modules accepted: Orders

## 2017-02-11 LAB — HEPATIC FUNCTION PANEL
ALBUMIN: 4.2 g/dL (ref 3.5–5.5)
ALK PHOS: 86 IU/L (ref 39–117)
ALT: 19 IU/L (ref 0–32)
AST: 18 IU/L (ref 0–40)
BILIRUBIN, DIRECT: 0.06 mg/dL (ref 0.00–0.40)
Total Protein: 7.1 g/dL (ref 6.0–8.5)

## 2017-02-14 NOTE — Telephone Encounter (Signed)
Paperwork faxed Copy for pt  Copy for file Copy for scan Copy for billing  Left message letting pt know paperwork has been faxed

## 2017-02-14 NOTE — Telephone Encounter (Signed)
Patient is calling to see if her FMLA paperwork was sent off yet, she said that it was due on 02/13/17. Thanks. Her number is [phone].

## 2017-03-02 ENCOUNTER — Encounter: Payer: Self-pay | Admitting: Family Medicine

## 2017-03-02 ENCOUNTER — Other Ambulatory Visit: Payer: Self-pay

## 2017-03-02 ENCOUNTER — Ambulatory Visit: Payer: 59 | Admitting: Family Medicine

## 2017-03-02 DIAGNOSIS — M654 Radial styloid tenosynovitis [de Quervain]: Secondary | ICD-10-CM | POA: Diagnosis not present

## 2017-03-02 DIAGNOSIS — M7741 Metatarsalgia, right foot: Secondary | ICD-10-CM

## 2017-03-02 MED ORDER — PREDNISONE 20 MG PO TABS: ORAL_TABLET | ORAL | 0 refills | Status: DC

## 2017-03-02 NOTE — Assessment & Plan Note (Signed)
SE to ibuprofen and NSAIDs.  treat with pred course, iuce, home PT, thumb spica splint.  Call for referral if not improving as expected.

## 2017-03-02 NOTE — Progress Notes (Signed)
   Subjective:    [name], female    DOB: 1970/04/23, 46 y.o.   MRN: [id number]  HPI   46 year old female patient of Dr.Aron's presents with recurrent onset pain in  Right wrist and hand.  In past dx with  Wrist tendonitis DeQuervain's tenosynovitis.  Treated in 05/2016 with  Prednisone  40 mg daily  7 days. She had resolution of pain.   In last  1 week she has had recurrence of swelling, pain in radial aspect of wrist.  No redness, no heat.  No known falls. She has been icing and resting. Bought thumb spica splint 2 days ago.  Using Excedrine PM at night. Has tried acetaminophen. Does not have and tramadol to use. Cannot take ibuprofen.     Also has chronic pain in right metatarsal heads with standing.   Review of Systems  Constitutional: Negative for fatigue and fever.  HENT: Negative for ear pain.   Eyes: Negative for pain.  Respiratory: Negative for chest tightness and shortness of breath.   Cardiovascular: Negative for chest pain, palpitations and leg swelling.  Gastrointestinal: Negative for abdominal pain.  Genitourinary: Negative for dysuria.       Objective:   Physical Exam  Constitutional: Vital signs are normal. She appears well-developed and well-nourished. She is cooperative.  Non-toxic appearance. She does not appear ill. No distress.  HENT:  Head: Normocephalic.  Right Ear: Hearing, tympanic membrane, external ear and ear canal normal. Tympanic membrane is not erythematous, not retracted and not bulging.  Left Ear: Hearing, tympanic membrane, external ear and ear canal normal. Tympanic membrane is not erythematous, not retracted and not bulging.  Nose: No mucosal edema or rhinorrhea. Right sinus exhibits no maxillary sinus tenderness and no frontal sinus tenderness. Left sinus exhibits no maxillary sinus tenderness and no frontal sinus tenderness.  Mouth/Throat: Uvula is midline, oropharynx is clear and moist and mucous membranes are normal.    Eyes: Conjunctivae, EOM and lids are normal. Pupils are equal, round, and reactive to light. Lids are everted and swept, no foreign bodies found.  Neck: Trachea normal and normal range of motion. Neck supple. Carotid bruit is not present. No thyroid mass and no thyromegaly present.  Cardiovascular: Normal rate, regular rhythm, S1 normal, S2 normal, normal heart sounds, intact distal pulses and normal pulses. Exam reveals no gallop and no friction rub.  No murmur heard. Pulmonary/Chest: Effort normal and breath sounds normal. No tachypnea. No respiratory distress. She has no decreased breath sounds. She has no wheezes. She has no rhonchi. She has no rales.  Abdominal: Soft. Normal appearance and bowel sounds are normal. There is no tenderness.  Musculoskeletal:  ttp over right metatarsal heads, no redness, no swelling    positive finklestein test, pain with supination pronation of wrist.  Swelling in radial side of right wrist  Neurological: She is alert.  Skin: Skin is warm, dry and intact. No rash noted.  Psychiatric: Her speech is normal and behavior is normal. Judgment and thought content normal. Her mood appears not anxious. Cognition and memory are normal. She does not exhibit a depressed mood.          Assessment & Plan:

## 2017-03-02 NOTE — Assessment & Plan Note (Signed)
Treat with metatarsal pad and comfortable shoes. Pred should also help.

## 2017-03-02 NOTE — Patient Instructions (Addendum)
Complete course of  prednsione x 6 days.  Elevate wrist to help with swelling.  Ice wrist, start home stretches. Use thumb spica split during the day when hand in use as well as at night.  Avoid repetitive twisting of wrist. Use metatarsal pads  For pain in right metatarsal heads.   Call if not improving as expected after 2 weeks for referral to sports med/ortho.

## 2017-03-15 ENCOUNTER — Encounter: Payer: Self-pay | Admitting: Family Medicine

## 2017-03-16 ENCOUNTER — Other Ambulatory Visit: Payer: Self-pay | Admitting: Family Medicine

## 2017-03-16 ENCOUNTER — Other Ambulatory Visit: Payer: Self-pay

## 2017-03-16 MED ORDER — SUMATRIPTAN SUCCINATE 25 MG PO TABS: ORAL_TABLET | ORAL | 5 refills | Status: DC

## 2017-03-16 NOTE — Progress Notes (Signed)
Rx filled to pharm/thx dmf

## 2017-04-19 ENCOUNTER — Ambulatory Visit: Payer: Self-pay | Admitting: Family Medicine

## 2017-04-24 ENCOUNTER — Ambulatory Visit (INDEPENDENT_AMBULATORY_CARE_PROVIDER_SITE_OTHER): Payer: 59

## 2017-04-24 ENCOUNTER — Ambulatory Visit: Payer: 59 | Admitting: Family Medicine

## 2017-04-24 ENCOUNTER — Encounter: Payer: Self-pay | Admitting: Family Medicine

## 2017-04-24 VITALS — BP 146/96 | HR 72 | Temp 98.9°F | Ht 64.25 in | Wt 225.8 lb

## 2017-04-24 DIAGNOSIS — M79641 Pain in right hand: Secondary | ICD-10-CM | POA: Diagnosis not present

## 2017-04-24 NOTE — Assessment & Plan Note (Signed)
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 The patient indicates understanding of these issues and agrees with the plan.

## 2017-04-24 NOTE — Progress Notes (Signed)
Subjective:   [name], female    DOB: 1970/11/08, 47 y.o.   MRN: [id number]  JANIT CUTTER is a pleasant 47 y.o. year old female who presents to clinic today with Hand Pain (Patient is here today C/O right hand pain.  There is swelling and pain in the joints of digits 2, 3, and 5.  Started in the 5th digit 3 months ago and has worsened.  )  on 04/24/2017  HPI:  Right hand pain- swelling and pain of 2, 3 and 5th digits for months.  Started in 5th digit and has spread to the other fingers.  Feels her hand is changing shape.  Cannot wear her rights anymore. Tender to touch.  Not warm.   Current Outpatient Medications on File Prior to Visit  Medication Sig Dispense Refill  . ALPRAZolam (XANAX) 0.25 MG tablet TAKE 1 TABLET BY MOUTH AT BEDTIME AS NEEDED 30 tablet 0  . amitriptyline (ELAVIL) 10 MG tablet Take 1 tablet (10 mg total) by mouth at bedtime. 90 tablet 0  . citalopram (CELEXA) 20 MG tablet TAKE 1 TABLET (20 MG TOTAL) BY MOUTH DAILY. 30 tablet 5  . pantoprazole (PROTONIX) 40 MG tablet TAKE 1 TABLET BY MOUTH  DAILY 90 tablet 3  . SUMAtriptan (IMITREX) 25 MG tablet May repeat in 2 hours if headache persists or recurs. 10 tablet 5   No current facility-administered medications on file prior to visit.     Allergies  Allergen Reactions  . Diflucan [Fluconazole]     Pelvic pain  . Topamax [Topiramate] Other (See Comments)    Reaction:  Body aches    . Hydrocodone Anxiety, Palpitations and Other (See Comments)    Reaction:  Migraines     Past Medical History:  Diagnosis Date  . Anemia   . Anxiety   . Colon polyp   . Depression   . GERD (gastroesophageal reflux disease)   . Headache    migraines   . History of hysterectomy    still has both ovaries  . History of migraine headaches   . PONV (postoperative nausea and vomiting)     Past Surgical History:  Procedure Laterality Date  . ABDOMINAL HYSTERECTOMY    . COLONOSCOPY    . KNEE ARTHROSCOPY      . knee athroscopy  5/12   LT  . LAPAROSCOPIC VAGINAL HYSTERECTOMY  5/11   Supra cervical  . left partial knee replacement    . PARTIAL KNEE ARTHROPLASTY Left 02/10/2014   Procedure: LEFT MEDIAL UNICOMPARTMENTAL KNEE ARTHROPLASTY;  Surgeon: Gearlean Alf, MD;  Location: [facility];  Service: Orthopedics;  Laterality: Left;  . UPPER GI ENDOSCOPY    . WISDOM TOOTH EXTRACTION  1995    Family History  Problem Relation Age of Onset  . Stroke Father   . Heart attack Father   . Hyperlipidemia Mother   . Hypertension Mother   . Breast cancer Maternal Aunt   . Colon cancer Neg Hx   . Colon polyps Neg Hx   . Kidney disease Neg Hx   . Gallbladder disease Neg Hx   . Esophageal cancer Neg Hx   . Alcohol abuse Neg Hx   . Drug abuse Neg Hx   . Depression Neg Hx   . Bipolar disorder Neg Hx   . Anxiety disorder Neg Hx   . Schizophrenia Neg Hx   . Suicidality Neg Hx     Social History   Socioeconomic History  .  Marital status: Single    Spouse name: Not on file  . Number of children: 1  . Years of education: Not on file  . Highest education level: Not on file  Social Needs  . Financial resource strain: Not on file  . Food insecurity - worry: Not on file  . Food insecurity - inability: Not on file  . Transportation needs - medical: Not on file  . Transportation needs - non-medical: Not on file  Occupational History  . 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 Smoking status: Never Smoker  . Smokeless tobacco: Never Used  Substance and Sexual Activity  . Alcohol use: Yes    Alcohol/week: 0.0 oz    Comment: occassional- once a month has about 2-3 drinks  . Drug use: No  . Sexual activity: No  Other Topics Concern  . Not on file  Social History Narrative  . Not on file   The PMH, PSH, Social History, Family History, Medications, and allergies have been reviewed in [facility], and have been updated if relevant.  Review of Systems  Musculoskeletal: Positive for  arthralgias.  All other systems reviewed and are negative.      Objective:    BP (!) 146/96 (BP Location: Left Arm, Patient Position: Sitting, Cuff Size: Normal)   Pulse 72   Temp 98.9 F (37.2 C) (Oral)   Ht 5' 4.25" (1.632 m)   Wt 225 lb 12.8 oz (102.4 kg)   LMP 09/07/2009   SpO2 97%   BMI 38.46 kg/m    Physical Exam  Constitutional: She is oriented to person, place, and time. She appears well-developed and well-nourished. No distress.  HENT:  Head: Normocephalic and atraumatic.  Eyes: Conjunctivae are normal.  Neck: Normal range of motion.  Cardiovascular: Normal rate.  Pulmonary/Chest: Effort normal.  Musculoskeletal:       Right wrist: She exhibits tenderness, bony tenderness, swelling and deformity.  Neurological: She is alert and oriented to person, place, and time. No cranial nerve deficit.  Skin: Skin is dry. She is not diaphoretic.  Psychiatric: She has a normal mood and affect. Her behavior is normal. Judgment and thought content normal.  Nursing note and vitals reviewed.         Assessment & Plan:   Right hand pain - Plan: Rheumatoid factor, Sedimentation rate, DG Hand Complete Right, CBC No Follow-up on file.

## 2017-04-24 NOTE — Addendum Note (Signed)
Addended by: Lynnea Ferrier on: 04/24/2017 04:20 PM   Modules accepted: Orders

## 2017-04-24 NOTE — Addendum Note (Signed)
Addended by: Lynnea Ferrier on: 04/24/2017 04:19 PM   Modules accepted: Orders

## 2017-04-25 ENCOUNTER — Encounter: Payer: Self-pay | Admitting: Family Medicine

## 2017-04-27 ENCOUNTER — Encounter: Payer: Self-pay | Admitting: Family Medicine

## 2017-04-27 LAB — RHEUMATOID FACTOR: Rhuematoid fact SerPl-aCnc: <10 IU/mL (ref 0.0–13.9)

## 2017-04-27 LAB — CBC
Hematocrit: 32.6 % — ABNORMAL LOW (ref 34.0–46.6)
Hemoglobin: 10.2 g/dL — ABNORMAL LOW (ref 11.1–15.9)
MCH: 24.9 pg — AB (ref 26.6–33.0)
MCHC: 31.3 g/dL — ABNORMAL LOW (ref 31.5–35.7)
MCV: 80 fL (ref 79–97)
PLATELETS: 365 10*3/uL (ref 150–379)
RBC: 4.1 x10E6/uL (ref 3.77–5.28)
RDW: 16.6 % — AB (ref 12.3–15.4)
WBC: 7.4 10*3/uL (ref 3.4–10.8)

## 2017-04-27 LAB — SEDIMENTATION RATE

## 2017-04-28 ENCOUNTER — Telehealth: Payer: Self-pay | Admitting: Family Medicine

## 2017-04-28 ENCOUNTER — Other Ambulatory Visit: Payer: Self-pay

## 2017-04-28 MED ORDER — TRAMADOL HCL 50 MG PO TABS: 50.0000 mg | ORAL_TABLET | Freq: Four times a day (QID) | ORAL | 1 refills | Status: DC | PRN

## 2017-04-28 NOTE — Progress Notes (Signed)
Called to pharm per TA/thx dmf

## 2017-04-28 NOTE — Telephone Encounter (Signed)
TA-I have Patricia Bauer in the lab checking on the lab test that states "unable to complete test due to the age?" anyhow I called pt and told her that one of the tests are not back yet so when that comes back then you will be able to give me results to call her about/all of this was prompted as I received a message that the pt wanted to know about her results/as soon as I know something about the lab above I will let you know/thx dmf

## 2017-04-28 NOTE — Telephone Encounter (Signed)
Copied from [place] [id number]. Topic: Inquiry >> Apr 28, 2017  9:06 AM Arletha Grippe wrote: Reason for CRM: pt inquiring about labs and xrays that were done on Monday. Please mychart pt to giver her the results.

## 2017-05-01 ENCOUNTER — Other Ambulatory Visit: Payer: Self-pay | Admitting: Family Medicine

## 2017-05-01 ENCOUNTER — Encounter: Payer: Self-pay | Admitting: Family Medicine

## 2017-05-01 DIAGNOSIS — M1990 Unspecified osteoarthritis, unspecified site: Secondary | ICD-10-CM

## 2017-05-02 ENCOUNTER — Other Ambulatory Visit: Payer: Self-pay | Admitting: Family Medicine

## 2017-05-02 MED ORDER — TRAMADOL HCL 50 MG PO TABS: 50.0000 mg | ORAL_TABLET | Freq: Four times a day (QID) | ORAL | 1 refills | Status: DC | PRN

## 2017-05-02 NOTE — Telephone Encounter (Signed)
Routed back to provider 

## 2017-05-02 NOTE — Telephone Encounter (Signed)
Called pt left message. Office called in med to pharmacy that was indicated in patient call. Pt to call back if not there. Will route note back to office.

## 2017-05-02 NOTE — Telephone Encounter (Signed)
Called in to pharm and left on VM/pt aware/thx dmf

## 2017-05-02 NOTE — Telephone Encounter (Signed)
I called and spoke with pharmacist. They did not receive the Rx called in on 1/11. Rx given to pharmacist verbally. They will get Rx ready for patient to pick up.

## 2017-05-02 NOTE — Telephone Encounter (Signed)
Copied from [place] [phone]. Topic: Quick Communication - Rx Refill/Question >> May 02, 2017  8:09 AM Robina Ade, Helene Kelp D wrote: Medication: traMADol (ULTRAM) 50 MG tablet Has the patient contacted their pharmacy? Yes (Agent: If no, request that the patient contact the pharmacy for the refill.) Preferred Pharmacy (with phone number or street name): [id number] - [place], [place]: Please be advised that RX refills may take up to 3 business days. We ask that you follow-up with your pharmacy. Patient needs refill on her medication.

## 2017-05-02 NOTE — Telephone Encounter (Signed)
Relation to pt: self  Call back number: [phone]  Pharmacy: [id number] - [place], [place] [phone] (Phone) [phone] (Fax)     Reason for call:  [facility] contacted [facility] and pharmacy denies traMADol Veatrice Bourbon) 50 MG tablet was phone in 04/28/17, patient confirmed correct pharmacy, please advise

## 2017-05-03 ENCOUNTER — Encounter: Payer: Self-pay | Admitting: Family Medicine

## 2017-05-04 ENCOUNTER — Telehealth: Payer: Self-pay

## 2017-05-04 DIAGNOSIS — M25541 Pain in joints of right hand: Secondary | ICD-10-CM

## 2017-05-04 NOTE — Telephone Encounter (Signed)
Per TA redraw Sed Rate/future order created/pt is scheduled for a lab visit at [facility] for 1.18.19 @ 12pm/thx dmf

## 2017-05-05 ENCOUNTER — Other Ambulatory Visit (INDEPENDENT_AMBULATORY_CARE_PROVIDER_SITE_OTHER): Payer: 59

## 2017-05-05 DIAGNOSIS — M25541 Pain in joints of right hand: Secondary | ICD-10-CM

## 2017-05-05 NOTE — Addendum Note (Signed)
Addended by: Ellamae Sia on: 05/05/2017 12:42 PM   Modules accepted: Orders

## 2017-05-06 LAB — SEDIMENTATION RATE: Sed Rate: 31 mm/hr (ref 0–32)

## 2017-05-10 ENCOUNTER — Encounter: Payer: Self-pay | Admitting: Family Medicine

## 2017-05-16 ENCOUNTER — Encounter: Payer: Self-pay | Admitting: Family Medicine

## 2017-06-01 DIAGNOSIS — R102 Pelvic and perineal pain: Secondary | ICD-10-CM | POA: Diagnosis not present

## 2017-06-05 DIAGNOSIS — E559 Vitamin D deficiency, unspecified: Secondary | ICD-10-CM | POA: Diagnosis not present

## 2017-06-05 DIAGNOSIS — R102 Pelvic and perineal pain: Secondary | ICD-10-CM | POA: Diagnosis not present

## 2017-06-22 ENCOUNTER — Encounter: Payer: Self-pay | Admitting: Nurse Practitioner

## 2017-06-22 ENCOUNTER — Ambulatory Visit: Payer: 59 | Admitting: Nurse Practitioner

## 2017-06-22 ENCOUNTER — Telehealth: Payer: Self-pay

## 2017-06-22 VITALS — BP 136/92 | HR 69 | Temp 97.8°F | Ht 64.25 in | Wt 231.0 lb

## 2017-06-22 DIAGNOSIS — G43809 Other migraine, not intractable, without status migrainosus: Secondary | ICD-10-CM

## 2017-06-22 MED ORDER — KETOROLAC TROMETHAMINE 60 MG/2ML IM SOLN
60.0000 mg | Freq: Once | INTRAMUSCULAR | Status: AC
  Administered 2017-06-22: 60 mg via INTRAMUSCULAR

## 2017-06-22 MED ORDER — SUMATRIPTAN SUCCINATE 50 MG PO TABS: ORAL_TABLET | ORAL | 0 refills | Status: DC

## 2017-06-22 NOTE — Telephone Encounter (Signed)
Patient seen.

## 2017-06-22 NOTE — Progress Notes (Signed)
Subjective:  [name], female    DOB: October 29, 1970  Age: 47 y.o. MRN: [id number]  CC: Migraine (migraine headache 3 days/ getting wrose. ) and Joint Pain (joints pain--not sure if this is related to migrains?)   Migraine   This is a recurrent problem. The current episode started more than 1 year ago. The problem occurs intermittently. The problem has been unchanged. The pain is located in the bilateral and vertex region. The pain does not radiate. The pain quality is similar to prior headaches. The quality of the pain is described as dull and aching. Associated symptoms include phonophobia and photophobia. Pertinent negatives include no abdominal pain, anorexia, back pain, blurred vision, dizziness, eye pain, eye watering, fever, nausea, neck pain, numbness, rhinorrhea, scalp tenderness, sinus pressure, sore throat, tingling, tinnitus, visual change or weakness. The symptoms are aggravated by unknown. Treatments tried: 1dose of sumatriptan. Her past medical history is significant for migraine headaches and obesity. There is no history of hypertension, immunosuppression, recent head traumas or sinus disease.  stopped taking elavil due to side effects (increased appetite)  Outpatient Medications Prior to Visit  Medication Sig Dispense Refill  . ALPRAZolam (XANAX) 0.25 MG tablet TAKE 1 TABLET BY MOUTH AT BEDTIME AS NEEDED 30 tablet 0  . citalopram (CELEXA) 20 MG tablet TAKE 1 TABLET (20 MG TOTAL) BY MOUTH DAILY. 30 tablet 5  . pantoprazole (PROTONIX) 40 MG tablet TAKE 1 TABLET BY MOUTH  DAILY 90 tablet 3  . traMADol (ULTRAM) 50 MG tablet Take 1-2 tablets (50-100 mg total) by mouth every 6 (six) hours as needed for moderate pain. 30 tablet 1  . SUMAtriptan (IMITREX) 25 MG tablet May repeat in 2 hours if headache persists or recurs. 10 tablet 5  . amitriptyline (ELAVIL) 10 MG tablet Take 1 tablet (10 mg total) by mouth at bedtime. (Patient not taking: Reported on 06/22/2017) 90 tablet 0    No facility-administered medications prior to visit.     ROS See HPI  Objective:  BP (!) 136/92   Pulse 69   Temp 97.8 F (36.6 C)   Ht 5' 4.25" (1.632 m)   Wt 231 lb (104.8 kg)   LMP 09/07/2009   SpO2 99%   BMI 39.34 kg/m   BP Readings from Last 3 Encounters:  06/22/17 (!) 136/92  04/24/17 (!) 146/96  03/02/17 130/90    Wt Readings from Last 3 Encounters:  06/22/17 231 lb (104.8 kg)  04/24/17 225 lb 12.8 oz (102.4 kg)  03/02/17 227 lb 8 oz (103.2 kg)    Physical Exam  Constitutional: She is oriented to person, place, and time. No distress.  Cardiovascular: Normal rate.  Pulmonary/Chest: Effort normal and breath sounds normal.  Neurological: She is alert and oriented to person, place, and time.  Skin: Skin is warm and dry. No rash noted.  Psychiatric: She has a normal mood and affect. Her behavior is normal. Thought content normal.  Vitals reviewed.   Lab Results  Component Value Date   WBC 7.4 04/24/2017   HGB 10.2 (L) 04/24/2017   HCT 32.6 (L) 04/24/2017   PLT 365 04/24/2017   GLUCOSE 101 (H) 01/19/2017   CHOL 187 11/25/2016   TRIG 100 11/25/2016   HDL 50 11/25/2016   LDLCALC 117 (H) 11/25/2016   ALT 19 02/10/2017   AST 18 02/10/2017   NA 138 01/19/2017   K 4.3 01/19/2017   CL 103 01/19/2017   CREATININE 1.07 (H) 01/19/2017   BUN 14  01/19/2017   CO2 27 01/19/2017   TSH 2.050 11/25/2016   INR 1.02 02/07/2014    Assessment & Plan:   Evy was seen today for migraine and joint pain.  Diagnoses and all orders for this visit:  Other migraine without status migrainosus, not intractable -     ketorolac (TORADOL) injection 60 mg -     SUMAtriptan (IMITREX) 50 MG tablet; May repeat in 2 hours if headache persists or recurs. Do not use more than 100mg  in 24hrs   I have changed Margarette Asal. Leabo's SUMAtriptan. I am also having her maintain her citalopram, ALPRAZolam, pantoprazole, amitriptyline, and traMADol. We administered ketorolac.  Meds  ordered this encounter  Medications  . ketorolac (TORADOL) injection 60 mg  . SUMAtriptan (IMITREX) 50 MG tablet    Sig: May repeat in 2 hours if headache persists or recurs. Do not use more than 100mg  in 24hrs    Dispense:  20 tablet    Refill:  0    Order Specific Question:   Supervising Provider    Answer:   Lucille Passy [[id number]]    Follow-up: Return if symptoms worsen or fail to improve.  Wilfred Lacy, NP

## 2017-06-22 NOTE — Patient Instructions (Signed)
Use promethazine prescription as prescribed.  Migraine Headache A migraine headache is a very strong throbbing pain on one side or both sides of your head. Migraines can also cause other symptoms. Talk with your doctor about what things may bring on (trigger) your migraine headaches. Follow these instructions at home: Medicines  Take over-the-counter and prescription medicines only as told by your doctor.  Do not drive or use heavy machinery while taking prescription pain medicine.  To prevent or treat constipation while you are taking prescription pain medicine, your doctor may recommend that you: ? Drink enough fluid to keep your pee (urine) clear or pale yellow. ? Take over-the-counter or prescription medicines. ? Eat foods that are high in fiber. These include fresh fruits and vegetables, whole grains, and beans. ? Limit foods that are high in fat and processed sugars. These include fried and sweet foods. Lifestyle  Avoid alcohol.  Do not use any products that contain nicotine or tobacco, such as cigarettes and e-cigarettes. If you need help quitting, ask your doctor.  Get at least 8 hours of sleep every night.  Limit your stress. General instructions   Keep a journal to find out what may bring on your migraines. For example, write down: ? What you eat and drink. ? How much sleep you get. ? Any change in what you eat or drink. ? Any change in your medicines.  If you have a migraine: ? Avoid things that make your symptoms worse, such as bright lights. ? It may help to lie down in a dark, quiet room. ? Do not drive or use heavy machinery. ? Ask your doctor what activities are safe for you.  Keep all follow-up visits as told by your doctor. This is important. Contact a doctor if:  You get a migraine that is different or worse than your usual migraines. Get help right away if:  Your migraine gets very bad.  You have a fever.  You have a stiff neck.  You have  trouble seeing.  Your muscles feel weak or like you cannot control them.  You start to lose your balance a lot.  You start to have trouble walking.  You pass out (faint). This information is not intended to replace advice given to you by your health care provider. Make sure you discuss any questions you have with your health care provider. Document Released: 01/12/2008 Document Revised: 10/23/2015 Document Reviewed: 09/21/2015 Elsevier Interactive Patient Education  2018 [employer].

## 2017-06-22 NOTE — Telephone Encounter (Signed)
Copied from [place]. Topic: Appointment Scheduling - Scheduling Inquiry for Clinic >> Jun 22, 2017  7:59 AM Scherrie Gerlach wrote: Reason for CRM: pt states she has a migraine that will not go away and pain in all her joints.  Prefers to see Dr Deborra Medina.  Declined another provider. Pt would like to see only Dr Deborra Medina

## 2017-06-30 NOTE — Progress Notes (Signed)
Office Visit Note  Patient: Patricia Bauer             Date of Birth: Oct 12, 1970           MRN: [id number]             PCP: Lucille Passy, MD Referring: Lucille Passy, MD Visit Date: 07/14/2017 Occupation: Project specialist at [employer]:  New Patient (Initial Visit) (r/o RA, increased SED rate)   History of Present Illness: Patricia Bauer is a 47 y.o. female seen in consultation per request of her PCP.  According to patient her symptoms started in November 2018 with right fifth PIP joint pain.  She initially felt it was an injury and then gradually the pain got worse.  She was seen by her PCP who did lab work and her sedimentation rate was elevated.  At that time the doctor found that her both hands were swollen.  Patient states that gradually the pain moved to all of her joints.  She describes pain and discomfort in her C-spine, thoracic and lumbar spine.  She has been having discomfort in her shoulders, elbows, wrist joints and hands.  She also describes pain in her bilateral hips bilateral knee joints bilateral ankles and feet.  She has noticed swelling in her hands and ankle joints.  She recalls that Dr. Deborra Medina gave her prednisone taper for 1 week sometimes in February.  She noticed some improvement in her swelling on the medication.  Once the prednisone taper was over the symptoms recurred.  Activities of Daily Living:  Patient reports morning stiffness for 15 minutes.   Patient Reports nocturnal pain.  Difficulty dressing/grooming: Denies Difficulty climbing stairs: Reports Difficulty getting out of chair: Denies Difficulty using hands for taps, buttons, cutlery, and/or writing: Denies   Review of Systems  Constitutional: Positive for fatigue and weight gain. Negative for activity change, night sweats and weight loss.  HENT: Negative for mouth sores, trouble swallowing, trouble swallowing, mouth dryness and nose dryness.   Eyes: Negative for pain, redness, visual  disturbance and dryness.  Respiratory: Negative for cough, shortness of breath and difficulty breathing.   Cardiovascular: Negative for chest pain, palpitations, hypertension, irregular heartbeat and swelling in legs/feet.  Gastrointestinal: Positive for heartburn. Negative for blood in stool, constipation, diarrhea and nausea.  Endocrine: Negative for heat intolerance and increased urination.  Genitourinary: Negative for difficulty urinating and vaginal dryness.  Musculoskeletal: Positive for arthralgias, joint pain, joint swelling, morning stiffness and muscle tenderness. Negative for myalgias, muscle weakness and myalgias.  Skin: Negative for color change, rash, hair loss, skin tightness, ulcers and sensitivity to sunlight.  Allergic/Immunologic: Negative for susceptible to infections.  Neurological: Positive for headaches. Negative for dizziness, memory loss, night sweats and weakness.  Hematological: Negative for bruising/bleeding tendency and swollen glands.  Psychiatric/Behavioral: Positive for sleep disturbance. Negative for depressed mood. The patient is not nervous/anxious.     PMFS History:  Patient Active Problem List   Diagnosis Date Noted  . History of partial knee replacement left 07/14/2017  . Right hand pain 04/24/2017  . De Quervain's tenosynovitis, right 03/02/2017  . Metatarsalgia, right foot 03/02/2017  . Major depressive disorder, recurrent, severe without psychotic features ([place]) 01/15/2015  . GAD (generalized anxiety disorder) 01/15/2015  . Panic disorder without agoraphobia 01/15/2015  . Hemorrhoid prolapse 11/09/2014  . Obesity 09/08/2014  . Well woman exam 07/22/2014  . OA (osteoarthritis) of knee 02/10/2014  . Personal history of colonic polyps  10/16/2012  . History of headache 03/08/2012  . History of hysterectomy, supracervical 10/23/2011  . Anemia 09/08/2011  . GERD (gastroesophageal reflux disease) 09/08/2011  . Anxiety 09/08/2011  . Migraine headache  09/08/2011  . H/O: hysterectomy 09/08/2011  . Vaginal bleeding problems 09/08/2011    Past Medical History:  Diagnosis Date  . Anemia   . Anxiety   . Colon polyp   . Depression   . GERD (gastroesophageal reflux disease)   . Headache    migraines   . History of hysterectomy    still has both ovaries  . History of migraine headaches   . PONV (postoperative nausea and vomiting)     Family History  Problem Relation Age of Onset  . Stroke Father   . Heart attack Father   . Hyperlipidemia Mother   . Hypertension Mother   . Breast cancer Maternal Aunt   . Colon cancer Neg Hx   . Colon polyps Neg Hx   . Kidney disease Neg Hx   . Gallbladder disease Neg Hx   . Esophageal cancer Neg Hx   . Alcohol abuse Neg Hx   . Drug abuse Neg Hx   . Depression Neg Hx   . Bipolar disorder Neg Hx   . Anxiety disorder Neg Hx   . Schizophrenia Neg Hx   . Suicidality Neg Hx    Past Surgical History:  Procedure Laterality Date  . ABDOMINAL HYSTERECTOMY    . COLONOSCOPY    . KNEE ARTHROPLASTY    . KNEE ARTHROSCOPY    . knee athroscopy  5/12   LT  . LAPAROSCOPIC VAGINAL HYSTERECTOMY  5/11   Supra cervical  . left partial knee replacement    . PARTIAL KNEE ARTHROPLASTY Left 02/10/2014   Procedure: LEFT MEDIAL UNICOMPARTMENTAL KNEE ARTHROPLASTY;  Surgeon: Gearlean Alf, MD;  Location: [facility];  Service: Orthopedics;  Laterality: Left;  . UPPER GI ENDOSCOPY    . [place] EXTRACTION  1995   Social History   Social History Narrative  . Not on file     Objective: Vital Signs: BP 130/85 (BP Location: Left Arm, Patient Position: Sitting, Cuff Size: Normal)   Pulse 77   Resp 16   Ht 5' 4.5" (1.638 m)   Wt 230 lb (104.3 kg)   LMP 09/07/2009   BMI 38.87 kg/m    Physical Exam  Constitutional: She is oriented to person, place, and time. She appears well-developed and well-nourished.  HENT:  Head: Normocephalic and atraumatic.  Eyes: Conjunctivae and EOM are normal.  Neck: Normal  range of motion.  Cardiovascular: Normal rate, regular rhythm, normal heart sounds and intact distal pulses.  Pulmonary/Chest: Effort normal and breath sounds normal.  Abdominal: Soft. Bowel sounds are normal.  Lymphadenopathy:    She has no cervical adenopathy.  Neurological: She is alert and oriented to person, place, and time.  Skin: Skin is warm and dry. Capillary refill takes less than 2 seconds.  Psychiatric: She has a normal mood and affect. Her behavior is normal.  Nursing note and vitals reviewed.    Musculoskeletal Exam: Lumbar spine good range of motion.  She has some discomfort range of motion of her lower lumbar spine but no SI joint tenderness was noted.  Shoulder joints, elbow joints, wrist joints, MCPs, DIPs and PIPs were in good range of motion with no tenderness or synovitis.  She had discomfort range of motion of her right hip.  She has left knee joint partial replacement.  Right knee joint was in good range of motion without any warmth or swelling.  She is good range of motion of her ankle joints.  She has bilateral hallux valgus deformity.  There was no synovitis noted over MTPs or PIPs of her feet.  CDAI Exam: No CDAI exam completed.    Investigation: No additional findings. CBC Latest Ref Rng & Units 04/24/2017 01/19/2017 11/25/2016  WBC 3.4 - 10.8 x10E3/uL 7.4 9.2 6.4  Hemoglobin 11.1 - 15.9 g/dL 10.2(L) 11.6(L) 11.4  Hematocrit 34.0 - 46.6 % 32.6(L) 35.2 35.4  Platelets 150 - 379 x10E3/uL 365 326 340   CMP Latest Ref Rng & Units 02/10/2017 01/19/2017 11/25/2016  Glucose 65 - 99 mg/dL - 101(H) 93  BUN 6 - 20 mg/dL - 14 11  Creatinine 0.44 - 1.00 mg/dL - 1.07(H) 0.88  Sodium 135 - 145 mmol/L - 138 139  Potassium 3.5 - 5.1 mmol/L - 4.3 5.2  Chloride 101 - 111 mmol/L - 103 101  CO2 22 - 32 mmol/L - 27 24  Calcium 8.9 - 10.3 mg/dL - 9.2 9.0  Total Protein 6.0 - 8.5 g/dL 7.1 7.4 6.6  Total Bilirubin 0.0 - 1.2 mg/dL <0.2 0.3 <0.2  Alkaline Phos 39 - 117 IU/L 86 96 70    AST 0 - 40 IU/L 18 117(H) 12  ALT 0 - 32 IU/L 19 56(H) 28 January 2017 UA negative, January 2019 RF negative, May 05, 2017 ESR 31 normal Imaging: Xr Hip Unilat W Or W/o Pelvis 2-3 Views Right  Result Date: 07/14/2017 No SI joint narrowing was noted.  No hip joint narrowing was noted. Impression: Unremarkable x-ray of the hip joint.  Xr Foot 2 Views Left  Result Date: 07/14/2017 First MTP narrowing and subluxation with hallux valgus deformity was noted.  None of the other MTP showed narrowing or erosive changes.  No PIP or DIP.  No intertarsal joint space narrowing was noted.  A small calcaneal spur was noted. Impression: These findings are consistent with osteoarthritis of the foot.  Xr Foot 2 Views Right  Result Date: 07/14/2017 First MTP narrowing and subluxation with hallux valgus deformity was noted.  None of the other MTP showed narrowing or erosive changes.  No PIP or DIP.  No intertarsal joint space narrowing was noted.  A small calcaneal spur was noted. Impression: These findings are consistent with osteoarthritis of the foot.  Xr Hand 2 View Left  Result Date: 07/14/2017 No MCP narrowing or intercarpal radiocarpal narrowing was noted.  Mild PIP DIP narrowing was noted.   Speciality Comments: No specialty comments available.    Procedures:  No procedures performed Allergies: Diflucan [fluconazole]; Topamax [topiramate]; and Hydrocodone   Assessment / Plan:     Visit Diagnoses: Pain in both hands - RF and Sed rate WNL -patient gives history of frequent swelling in her hands.  She was given a prednisone taper about a month ago.  She noticed some improvement with the prednisone taper.  I do not see any synovitis on examination today.  I reviewed x-ray of her right hand done by radiologist in January.  I will obtain following x-rays and labs today.  I will also schedule ultrasound of her bilateral hands to look for synovitis.  I have advised her not to take any  anti-inflammatories or prednisone prior to ultrasound.  Plan: XR Hand 2 View Left, Uric acid, ANA, Cyclic citrul peptide antibody, IgG, 14-3-3 eta Protein, Angiotensin converting enzyme, HLA-B27 antigen the x-ray of the  left hand showed mild osteoarthritic changes.  Pain in right hip - Plan: XR HIP UNILAT W OR W/O PELVIS 2-3 VIEWS RIGHT.  The x-ray of the hip joint was unremarkable.  History of partial knee replacement left - Status post injury  Pain in both feet -she does have some osteoarthritic changes in her feet but no synovitis was noted.  Plan: XR Foot 2 Views Right, XR Foot 2 Views Left.  The x-rays were consistent with osteoarthritis of bilateral feet.  Anxiety and depression  History of migraine: She was having a flare of her migraine today.  History of gastroesophageal reflux (GERD)  Other fatigue - Plan: COMPLETE METABOLIC PANEL WITH GFR, CK, TSH    Orders: Orders Placed This Encounter  Procedures  . XR Hand 2 View Left  . XR HIP UNILAT W OR W/O PELVIS 2-3 VIEWS RIGHT  . XR Foot 2 Views Right  . XR Foot 2 Views Left  . COMPLETE METABOLIC PANEL WITH GFR  . CK  . TSH  . Uric acid  . ANA  . Cyclic citrul peptide antibody, IgG  . 14-3-3 eta Protein  . Angiotensin converting enzyme  . HLA-B27 antigen  . CMP14+EGFR   No orders of the defined types were placed in this encounter.   Face-to-face time spent with patient was 50 minutes. Greater than 50% of time was spent in counseling and coordination of care.  Follow-Up Instructions: Return for Polyarthralgia.   Bo Merino, MD  Note - This record has been created using Editor, commissioning.  Chart creation errors have been sought, but may not always  have been located. Such creation errors do not reflect on  the standard of medical care.

## 2017-07-05 ENCOUNTER — Other Ambulatory Visit: Payer: Self-pay | Admitting: Family Medicine

## 2017-07-14 ENCOUNTER — Encounter: Payer: Self-pay | Admitting: Rheumatology

## 2017-07-14 ENCOUNTER — Ambulatory Visit: Payer: 59 | Admitting: Rheumatology

## 2017-07-14 ENCOUNTER — Ambulatory Visit (INDEPENDENT_AMBULATORY_CARE_PROVIDER_SITE_OTHER): Payer: Self-pay

## 2017-07-14 VITALS — BP 130/85 | HR 77 | Resp 16 | Ht 64.5 in | Wt 230.0 lb

## 2017-07-14 DIAGNOSIS — M79642 Pain in left hand: Secondary | ICD-10-CM | POA: Diagnosis not present

## 2017-07-14 DIAGNOSIS — M79672 Pain in left foot: Secondary | ICD-10-CM | POA: Diagnosis not present

## 2017-07-14 DIAGNOSIS — Z96659 Presence of unspecified artificial knee joint: Secondary | ICD-10-CM | POA: Diagnosis not present

## 2017-07-14 DIAGNOSIS — Z9049 Acquired absence of other specified parts of digestive tract: Secondary | ICD-10-CM | POA: Insufficient documentation

## 2017-07-14 DIAGNOSIS — M25551 Pain in right hip: Secondary | ICD-10-CM | POA: Diagnosis not present

## 2017-07-14 DIAGNOSIS — Z8669 Personal history of other diseases of the nervous system and sense organs: Secondary | ICD-10-CM | POA: Diagnosis not present

## 2017-07-14 DIAGNOSIS — R5383 Other fatigue: Secondary | ICD-10-CM

## 2017-07-14 DIAGNOSIS — F329 Major depressive disorder, single episode, unspecified: Secondary | ICD-10-CM

## 2017-07-14 DIAGNOSIS — F32A Depression, unspecified: Secondary | ICD-10-CM

## 2017-07-14 DIAGNOSIS — M79671 Pain in right foot: Secondary | ICD-10-CM

## 2017-07-14 DIAGNOSIS — Z8719 Personal history of other diseases of the digestive system: Secondary | ICD-10-CM | POA: Diagnosis not present

## 2017-07-14 DIAGNOSIS — M79641 Pain in right hand: Secondary | ICD-10-CM

## 2017-07-14 DIAGNOSIS — F419 Anxiety disorder, unspecified: Secondary | ICD-10-CM

## 2017-08-09 ENCOUNTER — Other Ambulatory Visit: Payer: 59 | Admitting: Rheumatology

## 2017-08-14 ENCOUNTER — Other Ambulatory Visit: Payer: Self-pay | Admitting: Family Medicine

## 2017-08-15 ENCOUNTER — Ambulatory Visit: Payer: Self-pay | Admitting: Rheumatology

## 2017-08-17 NOTE — Telephone Encounter (Signed)
Rx last filled 11/23/16.

## 2017-08-17 NOTE — Telephone Encounter (Signed)
Rx faxed in.

## 2017-08-24 ENCOUNTER — Encounter: Payer: Self-pay | Admitting: Family Medicine

## 2017-08-30 ENCOUNTER — Other Ambulatory Visit: Payer: 59 | Admitting: Rheumatology

## 2017-09-04 ENCOUNTER — Ambulatory Visit: Payer: Self-pay | Admitting: Rheumatology

## 2017-09-04 ENCOUNTER — Encounter: Payer: Self-pay | Admitting: Family Medicine

## 2017-09-04 ENCOUNTER — Ambulatory Visit (INDEPENDENT_AMBULATORY_CARE_PROVIDER_SITE_OTHER): Payer: 59 | Admitting: Family Medicine

## 2017-09-04 VITALS — BP 124/84 | HR 81 | Temp 99.2°F | Ht 64.5 in | Wt 224.0 lb

## 2017-09-04 DIAGNOSIS — J069 Acute upper respiratory infection, unspecified: Secondary | ICD-10-CM | POA: Diagnosis not present

## 2017-09-04 MED ORDER — DOXYCYCLINE HYCLATE 100 MG PO TABS: 100.0000 mg | ORAL_TABLET | Freq: Two times a day (BID) | ORAL | 0 refills | Status: DC

## 2017-09-04 MED ORDER — PROMETHAZINE-DM 6.25-15 MG/5ML PO SYRP: 5.0000 mL | ORAL_SOLUTION | Freq: Four times a day (QID) | ORAL | 0 refills | Status: DC | PRN

## 2017-09-04 NOTE — Progress Notes (Signed)
SUBJECTIVE:  Patricia Bauer is a 47 y.o. female who complains of congestion and productive cough for 8 days. She denies a history of anorexia, chest pain and chills and denies a history of asthma. Patient denies smoke cigarettes.   Current Outpatient Medications on File Prior to Visit  Medication Sig Dispense Refill  . ALPRAZolam (XANAX) 0.25 MG tablet TAKE 1 TABLET BY MOUTH AT BEDTIME 30 tablet 0  . citalopram (CELEXA) 20 MG tablet TAKE 1 TABLET (20 MG TOTAL) BY MOUTH DAILY. 30 tablet 5  . pantoprazole (PROTONIX) 40 MG tablet TAKE 1 TABLET BY MOUTH  DAILY 90 tablet 3  . SUMAtriptan (IMITREX) 50 MG tablet May repeat in 2 hours if headache persists or recurs. Do not use more than 100mg  in 24hrs 20 tablet 0  . traMADol (ULTRAM) 50 MG tablet Take 1-2 tablets (50-100 mg total) by mouth every 6 (six) hours as needed for moderate pain. 30 tablet 1  . TURMERIC PO Take by mouth daily.    . Vitamin D, Ergocalciferol, (DRISDOL) 50000 units CAPS capsule Take 50,000 Units by mouth once a week.  3   No current facility-administered medications on file prior to visit.     Allergies  Allergen Reactions  . Diflucan [Fluconazole]     Pelvic pain  . Topamax [Topiramate] Other (See Comments)    Reaction:  Body aches    . Hydrocodone Anxiety, Palpitations and Other (See Comments)    Reaction:  Migraines     Past Medical History:  Diagnosis Date  . Anemia   . Anxiety   . Colon polyp   . Depression   . GERD (gastroesophageal reflux disease)   . Headache    migraines   . History of hysterectomy    still has both ovaries  . History of migraine headaches   . PONV (postoperative nausea and vomiting)     Past Surgical History:  Procedure Laterality Date  . ABDOMINAL HYSTERECTOMY    . COLONOSCOPY    . KNEE ARTHROPLASTY    . KNEE ARTHROSCOPY    . knee athroscopy  5/12   LT  . LAPAROSCOPIC VAGINAL HYSTERECTOMY  5/11   Supra cervical  . left partial knee replacement    . PARTIAL KNEE  ARTHROPLASTY Left 02/10/2014   Procedure: LEFT MEDIAL UNICOMPARTMENTAL KNEE ARTHROPLASTY;  Surgeon: Gearlean Alf, MD;  Location: [facility];  Service: Orthopedics;  Laterality: Left;  . UPPER GI ENDOSCOPY    . WISDOM TOOTH EXTRACTION  1995    Family History  Problem Relation Age of Onset  . Stroke Father   . Heart attack Father   . Hyperlipidemia Mother   . Hypertension Mother   . Breast cancer Maternal Aunt   . Colon cancer Neg Hx   . Colon polyps Neg Hx   . Kidney disease Neg Hx   . Gallbladder disease Neg Hx   . Esophageal cancer Neg Hx   . Alcohol abuse Neg Hx   . Drug abuse Neg Hx   . Depression Neg Hx   . Bipolar disorder Neg Hx   . Anxiety disorder Neg Hx   . Schizophrenia Neg Hx   . Suicidality Neg Hx     Social History   Socioeconomic History  . Marital status: Single    Spouse name: Not on file  . Number of children: 1  . Years of education: Not on file  . Highest education level: Not on file  Occupational History  . 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 Financial resource strain: Not on file  . Food insecurity:    Worry: Not on file    Inability: Not on file  . Transportation needs:    Medical: Not on file    Non-medical: Not on file  Tobacco Use  . Smoking status: Never Smoker  . Smokeless tobacco: Never Used  Substance and Sexual Activity  . Alcohol use: Yes    Alcohol/week: 0.0 oz    Comment: occassional- once a month has about 2-3 drinks  . Drug use: No  . Sexual activity: Never  Lifestyle  . Physical activity:    Days per week: Not on file    Minutes per session: Not on file  . Stress: Not on file  Relationships  . Social connections:    Talks on phone: Not on file    Gets together: Not on file    Attends religious service: Not on file    Active member of club or organization: Not on file    Attends meetings of clubs or organizations: Not on file    Relationship status: Not on file  . Intimate partner violence:     Fear of current or ex partner: Not on file    Emotionally abused: Not on file    Physically abused: Not on file    Forced sexual activity: Not on file  Other Topics Concern  . Not on file  Social History Narrative  . Not on file   The PMH, PSH, Social History, Family History, Medications, and allergies have been reviewed in [facility], and have been updated if relevant.  OBJECTIVE: BP 124/84 (BP Location: Left Arm, Patient Position: Sitting, Cuff Size: Normal)   Pulse 81   Temp 99.2 F (37.3 C) (Oral)   Ht 5' 4.5" (1.638 m)   Wt 224 lb (101.6 kg)   LMP 09/07/2009   SpO2 96%   BMI 37.86 kg/m   She appears well, vital signs are as noted. Ears normal.  Throat and pharynx normal.  Neck supple. No adenopathy in the neck. Nose is congested. Sinuses non tender, Left sided wheezes, ronchi  ASSESSMENT:  bronchitis  PLAN: Given duration and progression of symptoms, will treat for bacterial process with zpack, promethazine DM as needed for cough (discussed sedation precautions), add Mucinex as needed. Symptomatic therapy suggested: push fluids, rest and return office visit prn if symptoms persist or worsen.Call or return to clinic prn if these symptoms worsen or fail to improve as anticipated.

## 2017-09-04 NOTE — Patient Instructions (Signed)
Great to see you.  Take doxycyline as directed- 1 tablet twice daily along with promethazine DM as needed for cough (this will make you sleepy).  Take some mucinex- drink it with a lot of water.

## 2017-09-13 DIAGNOSIS — Z124 Encounter for screening for malignant neoplasm of cervix: Secondary | ICD-10-CM | POA: Diagnosis not present

## 2017-09-13 DIAGNOSIS — Z01419 Encounter for gynecological examination (general) (routine) without abnormal findings: Secondary | ICD-10-CM | POA: Diagnosis not present

## 2017-09-18 LAB — HM PAP SMEAR: HM Pap smear: NEGATIVE

## 2017-10-13 ENCOUNTER — Encounter: Payer: Self-pay | Admitting: Family Medicine

## 2017-10-23 ENCOUNTER — Ambulatory Visit: Payer: Self-pay | Admitting: Family Medicine

## 2017-10-25 ENCOUNTER — Ambulatory Visit: Payer: 59 | Admitting: Family Medicine

## 2017-10-25 ENCOUNTER — Encounter: Payer: Self-pay | Admitting: Family Medicine

## 2017-10-25 VITALS — BP 136/88 | HR 63 | Temp 98.7°F | Ht 64.5 in | Wt 218.0 lb

## 2017-10-25 DIAGNOSIS — F528 Other sexual dysfunction not due to a substance or known physiological condition: Secondary | ICD-10-CM | POA: Diagnosis not present

## 2017-10-25 DIAGNOSIS — F411 Generalized anxiety disorder: Secondary | ICD-10-CM | POA: Diagnosis not present

## 2017-10-25 DIAGNOSIS — F332 Major depressive disorder, recurrent severe without psychotic features: Secondary | ICD-10-CM

## 2017-10-25 DIAGNOSIS — G43809 Other migraine, not intractable, without status migrainosus: Secondary | ICD-10-CM | POA: Diagnosis not present

## 2017-10-25 MED ORDER — SUMATRIPTAN SUCCINATE 50 MG PO TABS: ORAL_TABLET | ORAL | 5 refills | Status: DC

## 2017-10-25 MED ORDER — CITALOPRAM HYDROBROMIDE 40 MG PO TABS: 40.0000 mg | ORAL_TABLET | Freq: Every day | ORAL | 3 refills | Status: DC

## 2017-10-25 NOTE — Assessment & Plan Note (Signed)
Resolving by end of OV. No changes made to rxs.

## 2017-10-25 NOTE — Progress Notes (Signed)
Subjective:   [name], female    DOB: [birth year], 47 y.o.   MRN: [id number]  Patricia Bauer is a pleasant 47 y.o. year old female who presents to clinic today with Medication Problem (Patient is here today to discuss a possible change in medication due to increased libido.  This started about 1.5 months ago and she says "It's like a switch flipped."  She is unsure if it is due to her medication or what. FYI: Colonoscopy scheduled next month.) and Migraine (She woke with a migraine this am.  She is having light and sound sensitivity.  Denies and N&V.  She took an Imitrex OTW here and states that it is easing a little but she is wearing her glasses and we had to turn one light out and cut music to the room for her comfort.)  on 10/25/2017  HPI:  Anxiety/depression- feels like maybe she needs to change rxs. She has been experiencing more increased libido and questions if it is due to celexa. Past month, she has increased daily sexual desire and does not like it.  She is single and does not like focusing on sex so much.  Feels it is becoming a problem.  Also has had increased panic attacks for about a month as well.  No other symptoms associated with these new symptoms.  Has been on numerous other antidepressants in past- was followed by behavorial health but stopped going in 2018 because she felt it "wasn't a good fit." Dr. Andria Frames at behavioral health stopped her remeron due to weight gain.  Stopped taking both lexapro and zoloft in past as she felt they both worsened her migraines.  She has felt celexa has been most beneficial in treating her anxiety.   Migraine- experiencing symptoms of an active migraine this morning- no n/v. Took imitrex this morning.   Current Outpatient Medications on File Prior to Visit  Medication Sig Dispense Refill  . ALPRAZolam (XANAX) 0.25 MG tablet TAKE 1 TABLET BY MOUTH AT BEDTIME 30 tablet 0  . citalopram (CELEXA) 20 MG tablet TAKE  1 TABLET (20 MG TOTAL) BY MOUTH DAILY. 30 tablet 5  . pantoprazole (PROTONIX) 40 MG tablet TAKE 1 TABLET BY MOUTH  DAILY 90 tablet 3  . traMADol (ULTRAM) 50 MG tablet Take 1-2 tablets (50-100 mg total) by mouth every 6 (six) hours as needed for moderate pain. 30 tablet 1  . TURMERIC PO Take by mouth daily.    . Vitamin D, Ergocalciferol, (DRISDOL) 50000 units CAPS capsule Take 50,000 Units by mouth once a week.  3   No current facility-administered medications on file prior to visit.     Allergies  Allergen Reactions  . Diflucan [Fluconazole]     Pelvic pain  . Topamax [Topiramate] Other (See Comments)    Reaction:  Body aches    . Hydrocodone Anxiety, Palpitations and Other (See Comments)    Reaction:  Migraines     Past Medical History:  Diagnosis Date  . Anemia   . Anxiety   . Colon polyp   . Depression   . GERD (gastroesophageal reflux disease)   . Headache    migraines   . History of hysterectomy    still has both ovaries  . History of migraine headaches   . PONV (postoperative nausea and vomiting)     Past Surgical History:  Procedure Laterality Date  . ABDOMINAL HYSTERECTOMY    . COLONOSCOPY    . KNEE ARTHROPLASTY    .  KNEE ARTHROSCOPY    . knee athroscopy  5/12   LT  . LAPAROSCOPIC VAGINAL HYSTERECTOMY  5/11   Supra cervical  . left partial knee replacement    . PARTIAL KNEE ARTHROPLASTY Left 02/10/2014   Procedure: LEFT MEDIAL UNICOMPARTMENTAL KNEE ARTHROPLASTY;  Surgeon: Gearlean Alf, MD;  Location: [facility];  Service: Orthopedics;  Laterality: Left;  . UPPER GI ENDOSCOPY    . WISDOM TOOTH EXTRACTION  1995    Family History  Problem Relation Age of Onset  . Stroke Father   . Heart attack Father   . Hyperlipidemia Mother   . Hypertension Mother   . Breast cancer Maternal Aunt   . Colon cancer Neg Hx   . Colon polyps Neg Hx   . Kidney disease Neg Hx   . Gallbladder disease Neg Hx   . Esophageal cancer Neg Hx   . Alcohol abuse Neg Hx   . Drug  abuse Neg Hx   . Depression Neg Hx   . Bipolar disorder Neg Hx   . Anxiety disorder Neg Hx   . Schizophrenia Neg Hx   . Suicidality Neg Hx     Social History   Socioeconomic History  . Marital status: Single    Spouse name: Not on file  . Number of children: 1  . Years of education: Not on file  . Highest education level: Not on file  Occupational History  . Occupation: Careers information officer  Social Needs  . Financial resource strain: Not on file  . Food insecurity:    Worry: Not on file    Inability: Not on file  . Transportation needs:    Medical: Not on file    Non-medical: Not on file  Tobacco Use  . Smoking status: Never Smoker  . Smokeless tobacco: Never Used  Substance and Sexual Activity  . Alcohol use: Yes    Alcohol/week: 0.0 oz    Comment: occassional- once a month has about 2-3 drinks  . Drug use: No  . Sexual activity: Never  Lifestyle  . Physical activity:    Days per week: Not on file    Minutes per session: Not on file  . Stress: Not on file  Relationships  . Social connections:    Talks on phone: Not on file    Gets together: Not on file    Attends religious service: Not on file    Active member of club or organization: Not on file    Attends meetings of clubs or organizations: Not on file    Relationship status: Not on file  . Intimate partner violence:    Fear of current or ex partner: Not on file    Emotionally abused: Not on file    Physically abused: Not on file    Forced sexual activity: Not on file  Other Topics Concern  . Not on file  Social History Narrative  . Not on file   The PMH, PSH, Social History, Family History, Medications, and allergies have been reviewed in [facility], and have been updated if relevant.   Review of Systems  Genitourinary:       + increased sexual desire (she feels too much)  Musculoskeletal: Negative.   Neurological: Positive for headaches. Negative for dizziness, tremors, seizures, syncope, facial  asymmetry, speech difficulty, weakness, light-headedness and numbness.  Hematological: Negative.   Psychiatric/Behavioral: Negative for behavioral problems, confusion, decreased concentration, dysphoric mood, hallucinations, self-injury, sleep disturbance and suicidal ideas. The patient is nervous/anxious. The patient  is not hyperactive.   All other systems reviewed and are negative.      Objective:    BP 136/88 (BP Location: Left Arm, Patient Position: Sitting, Cuff Size: Normal)   Pulse 63   Temp 98.7 F (37.1 C) (Oral)   Ht 5' 4.5" (1.638 m)   Wt 218 lb (98.9 kg)   LMP 09/07/2009   SpO2 97%   BMI 36.84 kg/m    Physical Exam  Constitutional: She is oriented to person, place, and time. She appears well-developed and well-nourished.  Wearing sunglasses due to photophobia- improved by end of OV  HENT:  Head: Atraumatic.  Right Ear: External ear normal.  Eyes: EOM are normal.  + photophobia  Neck: Normal range of motion.  Cardiovascular: Normal rate.  Pulmonary/Chest: Effort normal.  Musculoskeletal: Normal range of motion.  Neurological: She is alert and oriented to person, place, and time. No cranial nerve deficit.  Skin: Skin is warm and dry.  Psychiatric: She has a normal mood and affect. Her behavior is normal. Judgment and thought content normal.  Nursing note and vitals reviewed.         Assessment & Plan:   GAD (generalized anxiety disorder)  Major depressive disorder, recurrent, severe without psychotic features ([place])  Decreased libido  Other migraine without status migrainosus, not intractable - Plan: SUMAtriptan (IMITREX) 50 MG tablet No follow-ups on file.

## 2017-10-25 NOTE — Assessment & Plan Note (Signed)
Deteriorated. See above. Increase dose of celexa to 40 mg daily, refer for psychotherapy. Follow up in 3-4 weeks. The patient indicates understanding of these issues and agrees with the plan.

## 2017-10-25 NOTE — Assessment & Plan Note (Signed)
New- >25 minutes spent in face to face time with patient, >50% spent in counselling or coordination of care discussing worsening anxiety and increased sexual thoughts (she feels this is becoming compulsive). Refer for psychotherapy.  Discussed SLA meetings- she wants to until she sees the therapist. Increase dose of celexa - to hopefully improve her symptoms of anxiety and decrease libido (as this is a common side effect of SSRI/SNRIs). Check labs today to look into other possible causes. The patient indicates understanding of these issues and agrees with the plan. Orders Placed This Encounter  Procedures  . TSH  . Comprehensive metabolic panel  . Testosterone  . LH  . [place]  . Ambulatory referral to Psychology

## 2017-10-25 NOTE — Patient Instructions (Signed)
Great to see you. We are increasing your celexa to 40 mg daily.  We are referring to a therapist.  They should be calling you.  I will call you with your lab results from today and you can view them online.    Please keep me updated.

## 2017-10-26 ENCOUNTER — Encounter: Payer: Self-pay | Admitting: Family Medicine

## 2017-10-26 LAB — COMPREHENSIVE METABOLIC PANEL
ALK PHOS: 80 IU/L (ref 39–117)
ALT: 20 IU/L (ref 0–32)
AST: 14 IU/L (ref 0–40)
Albumin/Globulin Ratio: 1.5 (ref 1.2–2.2)
Albumin: 4.2 g/dL (ref 3.5–5.5)
BUN/Creatinine Ratio: 19 (ref 9–23)
BUN: 16 mg/dL (ref 6–24)
CHLORIDE: 100 mmol/L (ref 96–106)
CO2: 23 mmol/L (ref 20–29)
Calcium: 9 mg/dL (ref 8.7–10.2)
Creatinine, Ser: 0.85 mg/dL (ref 0.57–1.00)
GFR calc non Af Amer: 82 mL/min/{1.73_m2} (ref >59)
GFR, EST AFRICAN AMERICAN: 95 mL/min/{1.73_m2} (ref >59)
GLOBULIN, TOTAL: 2.8 g/dL (ref 1.5–4.5)
Glucose: 108 mg/dL — ABNORMAL HIGH (ref 65–99)
Potassium: 4.6 mmol/L (ref 3.5–5.2)
SODIUM: 136 mmol/L (ref 134–144)
TOTAL PROTEIN: 7 g/dL (ref 6.0–8.5)

## 2017-10-26 LAB — LUTEINIZING HORMONE: LH: 6.4 m[IU]/mL

## 2017-10-26 LAB — TSH: TSH: 2.84 u[IU]/mL (ref 0.450–4.500)

## 2017-10-26 LAB — FOLLICLE STIMULATING HORMONE: FSH: 4.9 m[IU]/mL

## 2017-10-26 LAB — TESTOSTERONE: Testosterone: 18 ng/dL (ref 8–48)

## 2017-11-08 ENCOUNTER — Encounter: Payer: Self-pay | Admitting: Family Medicine

## 2017-11-09 ENCOUNTER — Other Ambulatory Visit: Payer: Self-pay | Admitting: Family Medicine

## 2017-11-09 MED ORDER — CITALOPRAM HYDROBROMIDE 20 MG PO TABS: 30.0000 mg | ORAL_TABLET | Freq: Every day | ORAL | 3 refills | Status: DC

## 2017-11-10 ENCOUNTER — Ambulatory Visit: Payer: 59 | Admitting: Psychology

## 2017-11-10 DIAGNOSIS — F410 Panic disorder [episodic paroxysmal anxiety] without agoraphobia: Secondary | ICD-10-CM | POA: Diagnosis not present

## 2017-11-12 ENCOUNTER — Other Ambulatory Visit: Payer: Self-pay | Admitting: Nurse Practitioner

## 2017-11-12 DIAGNOSIS — G43809 Other migraine, not intractable, without status migrainosus: Secondary | ICD-10-CM

## 2017-11-16 ENCOUNTER — Ambulatory Visit: Payer: 59 | Admitting: Psychology

## 2017-11-16 DIAGNOSIS — F410 Panic disorder [episodic paroxysmal anxiety] without agoraphobia: Secondary | ICD-10-CM | POA: Diagnosis not present

## 2017-11-22 ENCOUNTER — Encounter: Payer: Self-pay | Admitting: Family Medicine

## 2017-11-22 LAB — HEMOGLOBIN A1C: Hgb A1c MFr Bld: 6.2 — AB (ref 4.0–6.0)

## 2017-11-22 LAB — LIPID PANEL: Cholesterol: 182 (ref 0–200)

## 2017-11-23 LAB — LIPID PANEL
CHOLESTEROL: 182 (ref 0–200)
HDL: 38 (ref 35–70)
LDL CALC: 112
Triglycerides: 162 — AB (ref 40–160)

## 2017-11-23 LAB — BASIC METABOLIC PANEL
Creatinine: 1 (ref 0.5–1.1)
Glucose: 94

## 2017-11-23 LAB — HEMOGLOBIN A1C: HEMOGLOBIN A1C: 6.2

## 2017-11-24 ENCOUNTER — Ambulatory Visit (INDEPENDENT_AMBULATORY_CARE_PROVIDER_SITE_OTHER): Payer: 59

## 2017-11-24 ENCOUNTER — Encounter: Payer: Self-pay | Admitting: Nurse Practitioner

## 2017-11-24 ENCOUNTER — Ambulatory Visit: Payer: 59 | Admitting: Nurse Practitioner

## 2017-11-24 ENCOUNTER — Ambulatory Visit: Payer: Self-pay | Admitting: Family Medicine

## 2017-11-24 VITALS — BP 140/100 | HR 97 | Temp 98.7°F | Ht 64.5 in | Wt 216.4 lb

## 2017-11-24 DIAGNOSIS — R0789 Other chest pain: Secondary | ICD-10-CM | POA: Diagnosis not present

## 2017-11-24 DIAGNOSIS — S299XXA Unspecified injury of thorax, initial encounter: Secondary | ICD-10-CM | POA: Diagnosis not present

## 2017-11-24 DIAGNOSIS — R0781 Pleurodynia: Secondary | ICD-10-CM | POA: Diagnosis not present

## 2017-11-24 NOTE — Patient Instructions (Addendum)
No rib fracture. Use naproxen 500mg  every12hrs with food x 3days, then as needed  Chest Contusion, Adult A chest contusion is a deep bruise to the chest. Contusions are usually the result of a blunt injury to tissues under the skin. The injury can damage the small blood vessels under the skin, which causes bleeding under the skin. The skin overlying the contusion may turn blue, purple, or yellow. Minor injuries may give you a painless contusion, but more severe contusions may stay painful and swollen for a few weeks. What are the causes? A contusion is usually caused by a hard hit (blow), trauma, or direct force to your chest, such as:  A motor vehicle accident.  Falls.  Bicycle injuries.  Contact sport injuries.  What increases the risk? You may be at a higher risk for a chest contusion if you play a sport in which falls and contact are common, such as football or soccer. What are the signs or symptoms? Symptoms of this condition include:  Chest swelling.  Pain and tenderness of the chest.  Discomfort with certain movements of the upper torso.  Discoloration of the chest. The area may have redness and then turn blue, purple, or yellow.  Discomfort when taking deep breaths.  How is this diagnosed? A chest contusion is diagnosed from a physical exam and your medical history. An X-ray may be needed to determine if there were any associated injuries, such as broken bones (fractures). Sometimes other tests such as CT scans, ultrasounds, or MRIs may be needed if internal injuries are suspected. A test that shows the amount of oxygen in your blood (pulse oximetry) may be done if you have trouble breathing. How is this treated? Often, the best treatment for a chest contusion is resting and applying ice to the injured area. Deep-breathing exercises may be recommended to reduce the risk of pneumonia. Oxygen therapy may be given if you have trouble breathing or have low oxygen levels.  Over-the-counter medicines may also be recommended for pain control. Follow these instructions at home:  If directed, apply ice to the injured area. ? Put ice in a plastic bag. ? Place a towel between your skin and the bag. ? Leave the ice on for 20 minutes, 2-3 times per day.  Take over-the-counter and prescription medicines only as told by your health care provider.  Do any deep-breathing exercises as told by your health care provider, if this applies.  Do not lie down flat on your back. Keep your head and chest raised (elevated) when you are resting or sleeping.  Do not use any products that contain nicotine or tobacco, such as cigarettes and e-cigarettes. If you need help quitting, ask your health care provider.  Do not lift anything that causes you discomfort or pain. Contact a health care provider if:  Your swelling or pain is not relieved with medicines or treatment.  You have increased bruising or swelling.  You have pain that is getting worse.  Your symptoms have not improved after one week. Get help right away if:  You have a sudden, significant increase in pain.  You have difficulty breathing.  You have dizziness, weakness, or fainting.  You have blood in your urine or stool.  You cough up blood or you vomit blood. Summary  A chest contusion is a deep bruise to the chest that is usually caused by a hard hit, trauma, or direct force to your chest.  Treatment for a chest contusion may include resting and  applying ice to the injured area.  Contact a health care provider if you have problems breathing or if your pain does not improve with treatment. This information is not intended to replace advice given to you by your health care provider. Make sure you discuss any questions you have with your health care provider. Document Released: 12/28/2000 Document Revised: 12/31/2015 Document Reviewed: 12/31/2015 Elsevier Interactive Patient Education  [employer].

## 2017-11-24 NOTE — Progress Notes (Signed)
Subjective:  [name], female    DOB: [birth year]  Age: 47 y.o. MRN: [id number]  CC: Rib Injury (Pt got injured during [employer] training (self defense class) , pt hit in right rib cage , pain x1 mo , hasn't taking any meds )   Chest Pain   This is a new problem. The current episode started 1 to 4 weeks ago. The problem occurs constantly. The problem has been gradually worsening. The pain is present in the lateral region. The quality of the pain is described as dull (arching). The pain does not radiate. Pertinent negatives include no dizziness, exertional chest pressure, fever, irregular heartbeat, malaise/fatigue, nausea, numbness, orthopnea, palpitations, shortness of breath or weakness. The pain is aggravated by deep breathing and movement. She has tried nothing for the symptoms.   after after blunt trauma during self defense class.  Reviewed past Medical, Social and Family history today.  Outpatient Medications Prior to Visit  Medication Sig Dispense Refill  . ALPRAZolam (XANAX) 0.25 MG tablet TAKE 1 TABLET BY MOUTH AT BEDTIME 30 tablet 0  . citalopram (CELEXA) 20 MG tablet Take 1.5 tablets (30 mg total) by mouth daily. 45 tablet 3  . pantoprazole (PROTONIX) 40 MG tablet TAKE 1 TABLET BY MOUTH  DAILY 90 tablet 3  . SUMAtriptan (IMITREX) 50 MG tablet May repeat in 2 hours if headache persists or recurs. Do not use more than 100mg  in 24hrs 20 tablet 5  . SUMAtriptan (IMITREX) 50 MG tablet TAKE 1 TABLET MAY REPEAT IN 2 HOURS IF HEADACHE PERSISTS OR RECURS. DON'T USE MORE THAN TWO IN 24HRS 9 tablet 0  . traMADol (ULTRAM) 50 MG tablet Take 1-2 tablets (50-100 mg total) by mouth every 6 (six) hours as needed for moderate pain. 30 tablet 1  . Vitamin D, Ergocalciferol, (DRISDOL) 50000 units CAPS capsule Take 50,000 Units by mouth once a week.  3  . TURMERIC PO Take by mouth daily.     No facility-administered medications prior to visit.     ROS See HPI  Objective:  BP  (!) 140/100   Pulse 97   Temp 98.7 F (37.1 C) (Oral)   Ht 5' 4.5" (1.638 m)   Wt 216 lb 6.4 oz (98.2 kg)   LMP 09/07/2009   SpO2 98%   BMI 36.57 kg/m   BP Readings from Last 3 Encounters:  11/24/17 (!) 140/100  10/25/17 136/88  09/04/17 124/84    Wt Readings from Last 3 Encounters:  11/24/17 216 lb 6.4 oz (98.2 kg)  10/25/17 218 lb (98.9 kg)  09/04/17 224 lb (101.6 kg)    Physical Exam  Cardiovascular: Normal rate, regular rhythm and normal heart sounds.  Pulmonary/Chest: Effort normal and breath sounds normal. She has no wheezes. She has no rales. She exhibits tenderness.    Abdominal: Soft. Bowel sounds are normal. She exhibits no distension. There is tenderness. There is guarding.  Skin: Skin is warm and dry. No rash noted. No erythema.  Vitals reviewed.   Lab Results  Component Value Date   WBC 7.4 04/24/2017   HGB 10.2 (L) 04/24/2017   HCT 32.6 (L) 04/24/2017   PLT 365 04/24/2017   GLUCOSE 108 (H) 10/25/2017   CHOL 187 11/25/2016   TRIG 100 11/25/2016   HDL 50 11/25/2016   LDLCALC 117 (H) 11/25/2016   ALT 20 10/25/2017   AST 14 10/25/2017   NA 136 10/25/2017   K 4.6 10/25/2017   CL 100 10/25/2017  CREATININE 0.85 10/25/2017   BUN 16 10/25/2017   CO2 23 10/25/2017   TSH 2.840 10/25/2017   INR 1.02 02/07/2014    Assessment & Plan:   Patricia Bauer was seen today for rib injury.  Diagnoses and all orders for this visit:  Soft tissue injury of chest wall, initial encounter -     DG Ribs Unilateral Right  Anterior pleuritic pain -     DG Ribs Unilateral Right   I am having Patricia Bauer maintain her pantoprazole, traMADol, Vitamin D (Ergocalciferol), TURMERIC PO, ALPRAZolam, SUMAtriptan, citalopram, and SUMAtriptan.  No orders of the defined types were placed in this encounter.   Follow-up: No follow-ups on file.  Wilfred Lacy, NP

## 2017-12-01 ENCOUNTER — Ambulatory Visit: Payer: 59 | Admitting: Psychology

## 2017-12-01 DIAGNOSIS — F410 Panic disorder [episodic paroxysmal anxiety] without agoraphobia: Secondary | ICD-10-CM

## 2017-12-05 ENCOUNTER — Encounter: Payer: Self-pay | Admitting: Family Medicine

## 2017-12-07 DIAGNOSIS — Z1211 Encounter for screening for malignant neoplasm of colon: Secondary | ICD-10-CM | POA: Diagnosis not present

## 2017-12-07 DIAGNOSIS — Z86010 Personal history of colonic polyps: Secondary | ICD-10-CM | POA: Diagnosis not present

## 2017-12-07 DIAGNOSIS — K648 Other hemorrhoids: Secondary | ICD-10-CM | POA: Diagnosis not present

## 2017-12-15 ENCOUNTER — Ambulatory Visit: Payer: 59 | Admitting: Psychology

## 2017-12-15 DIAGNOSIS — F410 Panic disorder [episodic paroxysmal anxiety] without agoraphobia: Secondary | ICD-10-CM | POA: Diagnosis not present

## 2017-12-22 ENCOUNTER — Ambulatory Visit: Payer: 59 | Admitting: Psychology

## 2017-12-22 DIAGNOSIS — F410 Panic disorder [episodic paroxysmal anxiety] without agoraphobia: Secondary | ICD-10-CM | POA: Diagnosis not present

## 2017-12-29 ENCOUNTER — Ambulatory Visit (INDEPENDENT_AMBULATORY_CARE_PROVIDER_SITE_OTHER): Payer: 59 | Admitting: Psychology

## 2017-12-29 ENCOUNTER — Other Ambulatory Visit: Payer: Self-pay | Admitting: Family Medicine

## 2017-12-29 DIAGNOSIS — F410 Panic disorder [episodic paroxysmal anxiety] without agoraphobia: Secondary | ICD-10-CM

## 2018-01-04 ENCOUNTER — Encounter: Payer: Self-pay | Admitting: Family Medicine

## 2018-01-04 ENCOUNTER — Other Ambulatory Visit: Payer: Self-pay | Admitting: Family Medicine

## 2018-01-05 ENCOUNTER — Other Ambulatory Visit: Payer: Self-pay

## 2018-01-05 ENCOUNTER — Ambulatory Visit: Payer: 59 | Admitting: Psychology

## 2018-01-10 ENCOUNTER — Ambulatory Visit (INDEPENDENT_AMBULATORY_CARE_PROVIDER_SITE_OTHER): Payer: 59

## 2018-01-10 DIAGNOSIS — Z23 Encounter for immunization: Secondary | ICD-10-CM | POA: Diagnosis not present

## 2018-01-12 ENCOUNTER — Ambulatory Visit: Payer: Self-pay | Admitting: Psychology

## 2018-01-12 ENCOUNTER — Ambulatory Visit: Payer: 59 | Admitting: Psychology

## 2018-01-14 ENCOUNTER — Other Ambulatory Visit: Payer: Self-pay | Admitting: Family Medicine

## 2018-01-17 ENCOUNTER — Other Ambulatory Visit: Payer: Self-pay

## 2018-01-17 DIAGNOSIS — G43809 Other migraine, not intractable, without status migrainosus: Secondary | ICD-10-CM

## 2018-01-17 DIAGNOSIS — R102 Pelvic and perineal pain: Secondary | ICD-10-CM | POA: Diagnosis not present

## 2018-01-17 MED ORDER — SUMATRIPTAN SUCCINATE 50 MG PO TABS: ORAL_TABLET | ORAL | 2 refills | Status: DC

## 2018-01-18 DIAGNOSIS — E559 Vitamin D deficiency, unspecified: Secondary | ICD-10-CM | POA: Diagnosis not present

## 2018-01-18 DIAGNOSIS — I10 Essential (primary) hypertension: Secondary | ICD-10-CM | POA: Diagnosis not present

## 2018-01-19 ENCOUNTER — Ambulatory Visit: Payer: 59 | Admitting: Psychology

## 2018-01-22 ENCOUNTER — Ambulatory Visit (INDEPENDENT_AMBULATORY_CARE_PROVIDER_SITE_OTHER): Payer: 59

## 2018-01-22 ENCOUNTER — Ambulatory Visit (INDEPENDENT_AMBULATORY_CARE_PROVIDER_SITE_OTHER): Payer: 59 | Admitting: Family Medicine

## 2018-01-22 VITALS — BP 130/82 | HR 72 | Temp 98.5°F | Ht 64.5 in | Wt 214.2 lb

## 2018-01-22 DIAGNOSIS — F332 Major depressive disorder, recurrent severe without psychotic features: Secondary | ICD-10-CM | POA: Diagnosis not present

## 2018-01-22 DIAGNOSIS — E559 Vitamin D deficiency, unspecified: Secondary | ICD-10-CM

## 2018-01-22 DIAGNOSIS — Z1239 Encounter for other screening for malignant neoplasm of breast: Secondary | ICD-10-CM | POA: Diagnosis not present

## 2018-01-22 DIAGNOSIS — D509 Iron deficiency anemia, unspecified: Secondary | ICD-10-CM

## 2018-01-22 DIAGNOSIS — M79671 Pain in right foot: Secondary | ICD-10-CM | POA: Insufficient documentation

## 2018-01-22 DIAGNOSIS — M19071 Primary osteoarthritis, right ankle and foot: Secondary | ICD-10-CM | POA: Diagnosis not present

## 2018-01-22 DIAGNOSIS — D519 Vitamin B12 deficiency anemia, unspecified: Secondary | ICD-10-CM | POA: Diagnosis not present

## 2018-01-22 DIAGNOSIS — Z0000 Encounter for general adult medical examination without abnormal findings: Secondary | ICD-10-CM

## 2018-01-22 DIAGNOSIS — Z90710 Acquired absence of both cervix and uterus: Secondary | ICD-10-CM

## 2018-01-22 DIAGNOSIS — F411 Generalized anxiety disorder: Secondary | ICD-10-CM

## 2018-01-22 NOTE — Progress Notes (Signed)
Subjective:   [name], female    DOB: [birth year], 47 y.o.   MRN: [id number]  Patricia Bauer is a pleasant 47 y.o. year old female who presents to clinic today with Annual Exam (CPE, Has GYN. Not fasting. Feels like she may have broken or injured R foot about 2 months ago.)  on 01/22/2018  HPI:  Health Maintenance  Topic Date Due  . HIV Screening  08/25/1986  . TETANUS/TDAP  04/24/2018 (Originally 08/25/1990)  . COLONOSCOPY  12/08/2022  . INFLUENZA VACCINE  Completed   Has GYN - was seen in May 2019.  Remote h/o hysterectomy. Last mammogram 05/2016.  Anxiety and depression- last saw her for this on 10/25/17.  Note reviewed.  We increased dose of celexa to 30 mg daily (from 20 mg daily)and referred her to psychology.  Seeing Marya Fossa (therapist) every two weeks.  She feels this is helpful.  Less depressed.  Still very tired. Wants to sleep all day- can go to bed at 5 pm and not wake up until her alarm goes off the next morning.  H/o Vit B12 and Vit D deficiency. Lab Results  Component Value Date   TSH 2.840 10/25/2017    Right foot injury- two months ago- occurred during a kick in [place]. There was never any swelling but had pain immediately that was worse with weight bearing-- this has improved but she is still very tender to touch on top of her foot.  She feels she is more tender than it should be 2 months out.  Current Outpatient Medications on File Prior to Visit  Medication Sig Dispense Refill  . ALPRAZolam (XANAX) 0.25 MG tablet TAKE 1 TABLET BY MOUTH AT BEDTIME 30 tablet 0  . citalopram (CELEXA) 20 MG tablet Take 1.5 tablets (30 mg total) by mouth daily. 45 tablet 3  . pantoprazole (PROTONIX) 40 MG tablet TAKE 1 TABLET BY MOUTH  DAILY 90 tablet 1  . SUMAtriptan (IMITREX) 50 MG tablet TAKE 1 TABLET MAY REPEAT IN 2 HOURS IF HEADACHE PERSISTS OR RECURS. DON'T USE MORE THAN TWO IN 24HRS 24 tablet 2  . traMADol (ULTRAM) 50 MG tablet Take 1-2 tablets (50-100  mg total) by mouth every 6 (six) hours as needed for moderate pain. 30 tablet 1  . Vitamin D, Ergocalciferol, (DRISDOL) 50000 units CAPS capsule Take 50,000 Units by mouth once a week.  3  . nitrofurantoin, macrocrystal-monohydrate, (MACROBID) 100 MG capsule      No current facility-administered medications on file prior to visit.     Allergies  Allergen Reactions  . Diflucan [Fluconazole]     Pelvic pain  . Onion     migraines   . Topamax [Topiramate] Other (See Comments)    Reaction:  Body aches    . Hydrocodone Anxiety, Palpitations and Other (See Comments)    Reaction:  Migraines     Past Medical History:  Diagnosis Date  . Anemia   . Anxiety   . Colon polyp   . Depression   . GERD (gastroesophageal reflux disease)   . Headache    migraines   . History of hysterectomy    still has both ovaries  . History of migraine headaches   . PONV (postoperative nausea and vomiting)     Past Surgical History:  Procedure Laterality Date  . ABDOMINAL HYSTERECTOMY    . COLONOSCOPY    . KNEE ARTHROPLASTY    . KNEE ARTHROSCOPY    . knee  athroscopy  5/12   LT  . LAPAROSCOPIC VAGINAL HYSTERECTOMY  5/11   Supra cervical  . left partial knee replacement    . PARTIAL KNEE ARTHROPLASTY Left 02/10/2014   Procedure: LEFT MEDIAL UNICOMPARTMENTAL KNEE ARTHROPLASTY;  Surgeon: Gearlean Alf, MD;  Location: [facility];  Service: Orthopedics;  Laterality: Left;  . UPPER GI ENDOSCOPY    . WISDOM TOOTH EXTRACTION  1995    Family History  Problem Relation Age of Onset  . Stroke Father   . Heart attack Father   . Hyperlipidemia Mother   . Hypertension Mother   . Breast cancer Maternal Aunt   . Colon cancer Neg Hx   . Colon polyps Neg Hx   . Kidney disease Neg Hx   . Gallbladder disease Neg Hx   . Esophageal cancer Neg Hx   . Alcohol abuse Neg Hx   . Drug abuse Neg Hx   . Depression Neg Hx   . Bipolar disorder Neg Hx   . Anxiety disorder Neg Hx   . Schizophrenia Neg Hx   .  Suicidality Neg Hx     Social History   Socioeconomic History  . Marital status: Single    Spouse name: Not on file  . Number of children: 1  . Years of education: Not on file  . Highest education level: Not on file  Occupational History  . Occupation: Careers information officer  Social Needs  . Financial resource strain: Not on file  . Food insecurity:    Worry: Not on file    Inability: Not on file  . Transportation needs:    Medical: Not on file    Non-medical: Not on file  Tobacco Use  . Smoking status: Never Smoker  . Smokeless tobacco: Never Used  Substance and Sexual Activity  . Alcohol use: Yes    Alcohol/week: 0.0 standard drinks    Comment: occassional- once a month has about 2-3 drinks  . Drug use: No  . Sexual activity: Never  Lifestyle  . Physical activity:    Days per week: Not on file    Minutes per session: Not on file  . Stress: Not on file  Relationships  . Social connections:    Talks on phone: Not on file    Gets together: Not on file    Attends religious service: Not on file    Active member of club or organization: Not on file    Attends meetings of clubs or organizations: Not on file    Relationship status: Not on file  . Intimate partner violence:    Fear of current or ex partner: Not on file    Emotionally abused: Not on file    Physically abused: Not on file    Forced sexual activity: Not on file  Other Topics Concern  . Not on file  Social History Narrative  . Not on file   The PMH, PSH, Social History, Family History, Medications, and allergies have been reviewed in [facility], and have been updated if relevant.   Review of Systems  Constitutional: Positive for fatigue.  HENT: Negative.   Eyes: Negative.   Respiratory: Negative.   Cardiovascular: Negative.   Gastrointestinal: Negative.   Endocrine: Negative.   Genitourinary: Negative.   Musculoskeletal: Positive for arthralgias.  Skin: Negative.   Allergic/Immunologic: Negative.    Neurological: Negative.   Hematological: Negative.   Psychiatric/Behavioral: Negative.   All other systems reviewed and are negative.      Objective:  BP 130/82 (BP Location: Left Arm, Patient Position: Sitting, Cuff Size: Normal)   Pulse 72   Temp 98.5 F (36.9 C) (Oral)   Ht 5' 4.5" (1.638 m)   Wt 214 lb 3.2 oz (97.2 kg)   LMP 09/07/2009   SpO2 98%   BMI 36.20 kg/m    Physical Exam  Constitutional: She is oriented to person, place, and time. She appears well-developed and well-nourished. No distress.  HENT:  Head: Normocephalic and atraumatic.  Eyes: Pupils are equal, round, and reactive to light. EOM are normal.  Neck: Neck supple.  Cardiovascular: Normal rate and regular rhythm.  Pulmonary/Chest: Effort normal and breath sounds normal. Right breast exhibits no inverted nipple, no mass, no nipple discharge, no skin change and no tenderness. Left breast exhibits no inverted nipple, no mass, no nipple discharge, no skin change and no tenderness.  Abdominal: Soft. Bowel sounds are normal.  Musculoskeletal:       Right foot: There is tenderness and bony tenderness. There is normal range of motion, no swelling, normal capillary refill, no crepitus, no deformity and no laceration.  Neurological: She is alert and oriented to person, place, and time. No cranial nerve deficit. Coordination normal.  Skin: Skin is warm and dry. She is not diaphoretic.  Psychiatric: She has a normal mood and affect. Her behavior is normal. Judgment and thought content normal.  Nursing note and vitals reviewed.         Assessment & Plan:   Well woman exam without gynecological exam - Plan: Hemoglobin A1c  H/O: hysterectomy  GAD (generalized anxiety disorder)  Vitamin D deficiency - Plan: Vitamin D (25 hydroxy)  Screening for breast cancer - Plan: MM Digital Screening  Anemia due to vitamin B12 deficiency, unspecified B12 deficiency type - Plan: B12, CBC with Differential/Platelet,  Ferritin  Right foot pain - Plan: DG Foot Complete Right No follow-ups on file.

## 2018-01-22 NOTE — Assessment & Plan Note (Signed)
Along with anxiety. Both improved with increased dose of celexa and regular psychotherapy. She is still quite fatigued- will check labs today. The patient indicates understanding of these issues and agrees with the plan. Orders Placed This Encounter  Procedures  . MM Digital Screening  . DG Foot Complete Right  . Basic metabolic panel  . Lipid panel  . Hemoglobin A1c  . Vitamin D (25 hydroxy)  . Hemoglobin A1c  . B12  . CBC with Differential/Platelet  . Ferritin

## 2018-01-22 NOTE — Patient Instructions (Addendum)
Great to see you.Great to see you. I will call you with your lab results from today and you can view them online.   Please call the breast center at [phone] to schedule your mammogram.

## 2018-01-22 NOTE — Assessment & Plan Note (Signed)
Reviewed preventive care protocols, scheduled due services, and updated immunizations Discussed nutrition, exercise, diet, and healthy lifestyle. Breast exam done today- mammogram ordered- pt will call the breast center to schedule mammography appointment.

## 2018-01-22 NOTE — Assessment & Plan Note (Signed)
She is quite tender on exam.  Foot xray today- may need to refer to Dr. Raeford Razor or ortho depending on results of xray.

## 2018-01-22 NOTE — Assessment & Plan Note (Signed)
Now with worsening fatigue- check CBC, ferritin, Vit B12 today. ? If anemia is part of her worsening fatigue. The patient indicates understanding of these issues and agrees with the plan.

## 2018-01-22 NOTE — Assessment & Plan Note (Signed)
She has been more fatigued- will recheck Vit D today.

## 2018-01-23 LAB — CBC WITH DIFFERENTIAL/PLATELET
Basophils Absolute: 0 10*3/uL (ref 0.0–0.2)
Basos: 0 %
EOS (ABSOLUTE): 0.1 10*3/uL (ref 0.0–0.4)
Eos: 1 %
Hematocrit: 32.9 % — ABNORMAL LOW (ref 34.0–46.6)
Hemoglobin: 10.4 g/dL — ABNORMAL LOW (ref 11.1–15.9)
IMMATURE GRANULOCYTES: 0 %
Immature Grans (Abs): 0 10*3/uL (ref 0.0–0.1)
Lymphocytes Absolute: 2.7 10*3/uL (ref 0.7–3.1)
Lymphs: 36 %
MCH: 23.8 pg — ABNORMAL LOW (ref 26.6–33.0)
MCHC: 31.6 g/dL (ref 31.5–35.7)
MCV: 75 fL — AB (ref 79–97)
MONOS ABS: 0.3 10*3/uL (ref 0.1–0.9)
Monocytes: 5 %
Neutrophils Absolute: 4.2 10*3/uL (ref 1.4–7.0)
Neutrophils: 58 %
Platelets: 362 10*3/uL (ref 150–450)
RBC: 4.37 x10E6/uL (ref 3.77–5.28)
RDW: 15.4 % (ref 12.3–15.4)
WBC: 7.3 10*3/uL (ref 3.4–10.8)

## 2018-01-23 LAB — VITAMIN B12: Vitamin B-12: 395 pg/mL (ref 232–1245)

## 2018-01-23 LAB — VITAMIN D 25 HYDROXY (VIT D DEFICIENCY, FRACTURES): VIT D 25 HYDROXY: 36.6 ng/mL (ref 30.0–100.0)

## 2018-01-23 LAB — FERRITIN: FERRITIN: 7 ng/mL — AB (ref 15–150)

## 2018-01-23 LAB — HEMOGLOBIN A1C
ESTIMATED AVERAGE GLUCOSE: 126 mg/dL
HEMOGLOBIN A1C: 6 % — AB (ref 4.8–5.6)

## 2018-01-26 ENCOUNTER — Ambulatory Visit: Payer: 59 | Admitting: Psychology

## 2018-01-26 DIAGNOSIS — F410 Panic disorder [episodic paroxysmal anxiety] without agoraphobia: Secondary | ICD-10-CM

## 2018-01-29 NOTE — Addendum Note (Signed)
Addended by: Lucille Passy on: 01/29/2018 07:14 PM   Modules accepted: Orders

## 2018-02-01 ENCOUNTER — Telehealth: Payer: Self-pay

## 2018-02-01 NOTE — Telephone Encounter (Signed)
Copied from [place] [id number]. Topic: General - Other >> Feb 01, 2018  2:00 PM Margot Ables wrote: Pt sent FMLA papers that had the wrong date and will be refaxing the correct dates. WRONG page 4 section 3A says 18 hours and 2 days CORRECT page 4 section will say 3A 9 hours and 1 day

## 2018-02-02 ENCOUNTER — Telehealth: Payer: Self-pay | Admitting: Hematology & Oncology

## 2018-02-02 ENCOUNTER — Ambulatory Visit: Payer: 59 | Admitting: Psychology

## 2018-02-02 NOTE — Telephone Encounter (Signed)
sw pt to confirm new patient appt date/time 10/9 at 3 pm.

## 2018-02-07 ENCOUNTER — Encounter: Payer: Self-pay | Admitting: Family Medicine

## 2018-02-09 ENCOUNTER — Ambulatory Visit: Payer: 59 | Admitting: Psychology

## 2018-02-09 DIAGNOSIS — F410 Panic disorder [episodic paroxysmal anxiety] without agoraphobia: Secondary | ICD-10-CM | POA: Diagnosis not present

## 2018-02-12 ENCOUNTER — Other Ambulatory Visit: Payer: Self-pay | Admitting: Family

## 2018-02-12 DIAGNOSIS — D649 Anemia, unspecified: Secondary | ICD-10-CM

## 2018-02-12 NOTE — Telephone Encounter (Signed)
Patient is inquiring if FMLA paperwork was received and processed. Please advise.

## 2018-02-13 ENCOUNTER — Inpatient Hospital Stay: Payer: 59 | Attending: Family

## 2018-02-13 ENCOUNTER — Inpatient Hospital Stay (HOSPITAL_BASED_OUTPATIENT_CLINIC_OR_DEPARTMENT_OTHER): Payer: 59 | Admitting: Family

## 2018-02-13 VITALS — BP 151/89 | HR 78 | Temp 98.7°F | Resp 19 | Ht 64.5 in | Wt 216.8 lb

## 2018-02-13 DIAGNOSIS — Z803 Family history of malignant neoplasm of breast: Secondary | ICD-10-CM | POA: Insufficient documentation

## 2018-02-13 DIAGNOSIS — D509 Iron deficiency anemia, unspecified: Secondary | ICD-10-CM | POA: Diagnosis not present

## 2018-02-13 DIAGNOSIS — Z808 Family history of malignant neoplasm of other organs or systems: Secondary | ICD-10-CM

## 2018-02-13 DIAGNOSIS — D649 Anemia, unspecified: Secondary | ICD-10-CM

## 2018-02-13 DIAGNOSIS — K909 Intestinal malabsorption, unspecified: Secondary | ICD-10-CM

## 2018-02-13 DIAGNOSIS — D418 Neoplasm of uncertain behavior of other specified urinary organs: Secondary | ICD-10-CM | POA: Diagnosis not present

## 2018-02-13 DIAGNOSIS — K648 Other hemorrhoids: Secondary | ICD-10-CM | POA: Diagnosis not present

## 2018-02-13 DIAGNOSIS — D508 Other iron deficiency anemias: Secondary | ICD-10-CM

## 2018-02-13 DIAGNOSIS — F410 Panic disorder [episodic paroxysmal anxiety] without agoraphobia: Secondary | ICD-10-CM | POA: Diagnosis not present

## 2018-02-13 DIAGNOSIS — Z79899 Other long term (current) drug therapy: Secondary | ICD-10-CM | POA: Insufficient documentation

## 2018-02-13 DIAGNOSIS — F418 Other specified anxiety disorders: Secondary | ICD-10-CM | POA: Diagnosis not present

## 2018-02-13 LAB — CBC WITH DIFFERENTIAL (CANCER CENTER ONLY)
Band Neutrophils: 0 %
Basophils Absolute: 0 10*3/uL (ref 0.0–0.1)
Basophils Relative: 0 %
EOS PCT: 1 %
Eosinophils Absolute: 0.1 10*3/uL (ref 0.0–0.5)
HEMATOCRIT: 34.7 % — AB (ref 36.0–46.0)
Hemoglobin: 10.2 g/dL — ABNORMAL LOW (ref 12.0–15.0)
LYMPHS ABS: 2.4 10*3/uL (ref 0.7–4.0)
LYMPHS PCT: 34 %
MCH: 23.8 pg — ABNORMAL LOW (ref 26.0–34.0)
MCHC: 29.4 g/dL — AB (ref 30.0–36.0)
MCV: 80.9 fL (ref 80.0–100.0)
MONO ABS: 0.5 10*3/uL (ref 0.1–1.0)
Monocytes Relative: 7 %
Neutro Abs: 4.1 10*3/uL (ref 1.7–7.7)
Neutrophils Relative %: 58 %
Platelet Count: 350 10*3/uL (ref 150–400)
RBC: 4.29 MIL/uL (ref 3.87–5.11)
RDW: 15.8 % — AB (ref 11.5–15.5)
WBC Count: 7.1 10*3/uL (ref 4.0–10.5)
nRBC: 0 % (ref 0.0–0.2)

## 2018-02-13 LAB — RETICULOCYTES
Immature Retic Fract: 11.5 % (ref 2.3–15.9)
RBC.: 4.29 MIL/uL (ref 3.87–5.11)
RETIC COUNT ABSOLUTE: 66.9 10*3/uL (ref 19.0–186.0)
RETIC CT PCT: 1.6 % (ref 0.4–3.1)

## 2018-02-13 LAB — VITAMIN B12: Vitamin B-12: 263 pg/mL (ref 180–914)

## 2018-02-13 LAB — SAVE SMEAR (SSMR)

## 2018-02-13 NOTE — Telephone Encounter (Signed)
Forms corrected, signed, faxed/thx dmf

## 2018-02-13 NOTE — Progress Notes (Signed)
Hematology/Oncology Consultation   Name: Patricia Bauer      MRN: [id number]    Location: Room/bed info not found  Date: 02/13/2018 Time:4:11 PM   REFERRING PHYSICIAN: Talia M. Deborra Medina, MD  REASON FOR CONSULT: Iron deficiency anemia    DIAGNOSIS: Iron deficiency anemia secondary to malabsorption  HISTORY OF PRESENT ILLNESS: Patricia Bauer is a very pleasant 47 yo caucasian female with history of anemia. Her recent ferritin was 7. Hgb stable at 10.2, MCV 80.9. Iron studies for today are pending.  She has been unable to tolerate oral iron due to GI upset and pain.  She is symptomatic at this time with fatigue, weakness, SOB with over exertion and chewing ice.  She has history of hysterectomy in 2011.  She had her colonscopy in august which was overall unremarkable. She does have internal hemorrhoids.  She has not noted any episodes of bleeding. No bruising or petechiae.  She odes not eat red meat or pork. She is also gluten intolerant. Her appetite comes and goes but she states that she does eat a lot of green leafy vegetables. She is staying well hydrated. Her weight is stable.  No family history of anemia that she knows of.  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 She states that she has panic atackes several times a week and takes Xanax if needed.  She has had multiple surgeries in the past without complications.  No personal history of cancer.  Family cancer history includes maternal grandfather - throat and maternal aunt - breast.  She plans to schedule her mammogram herself.  No fever, chills, n/v, cough, rash, dizziness, chest pain, palpitations, abdominal pain or changes in bowel or bladder habits.  No swelling, tenderness, numbness or tingling in her extremities at this time.  No lymphadenopathy.  She does not smoke. She does have the occasional alcoholic beverage socially.  She works for [employer].   ROS: All other 10 point review of systems is  negative.   PAST MEDICAL HISTORY:   Past Medical History:  Diagnosis Date  . Anemia   . Anxiety   . Colon polyp   . Depression   . GERD (gastroesophageal reflux disease)   . Headache    migraines   . History of hysterectomy    still has both ovaries  . History of migraine headaches   . PONV (postoperative nausea and vomiting)     ALLERGIES: Allergies  Allergen Reactions  . Diflucan [Fluconazole] Other (See Comments)    Pelvic pain  . Onion Other (See Comments)    migraines   . Topamax [Topiramate] Other (See Comments)    Reaction:  Body aches    . Hydrocodone Anxiety, Palpitations and Other (See Comments)    Reaction:  Migraines       MEDICATIONS:  Current Outpatient Medications on File Prior to Visit  Medication Sig Dispense Refill  . ALPRAZolam (XANAX) 0.25 MG tablet TAKE 1 TABLET BY MOUTH AT BEDTIME 30 tablet 0  . citalopram (CELEXA) 20 MG tablet Take 1.5 tablets (30 mg total) by mouth daily. 45 tablet 3  . pantoprazole (PROTONIX) 40 MG tablet TAKE 1 TABLET BY MOUTH  DAILY 90 tablet 1  . SUMAtriptan (IMITREX) 50 MG tablet TAKE 1 TABLET MAY REPEAT IN 2 HOURS IF HEADACHE PERSISTS OR RECURS. DON'T USE MORE THAN TWO IN 24HRS 24 tablet 2  . traMADol (ULTRAM) 50 MG tablet Take 1-2 tablets (50-100 mg total) by mouth every  6 (six) hours as needed for moderate pain. 30 tablet 1  . Vitamin D, Ergocalciferol, (DRISDOL) 50000 units CAPS capsule Take 50,000 Units by mouth once a week.  3  . nitrofurantoin, macrocrystal-monohydrate, (MACROBID) 100 MG capsule      No current facility-administered medications on file prior to visit.      PAST SURGICAL HISTORY Past Surgical History:  Procedure Laterality Date  . ABDOMINAL HYSTERECTOMY    . COLONOSCOPY    . KNEE ARTHROPLASTY    . KNEE ARTHROSCOPY    . knee athroscopy  5/12   LT  . LAPAROSCOPIC VAGINAL HYSTERECTOMY  5/11   Supra cervical  . left partial knee replacement    . PARTIAL KNEE ARTHROPLASTY Left 02/10/2014    Procedure: LEFT MEDIAL UNICOMPARTMENTAL KNEE ARTHROPLASTY;  Surgeon: Gearlean Alf, MD;  Location: [facility];  Service: Orthopedics;  Laterality: Left;  . UPPER GI ENDOSCOPY    . WISDOM TOOTH EXTRACTION  1995    FAMILY HISTORY: Family History  Problem Relation Age of Onset  . Stroke Father   . Heart attack Father   . Hyperlipidemia Mother   . Hypertension Mother   . Breast cancer Maternal Aunt   . Colon cancer Neg Hx   . Colon polyps Neg Hx   . Kidney disease Neg Hx   . Gallbladder disease Neg Hx   . Esophageal cancer Neg Hx   . Alcohol abuse Neg Hx   . Drug abuse Neg Hx   . Depression Neg Hx   . Bipolar disorder Neg Hx   . Anxiety disorder Neg Hx   . Schizophrenia Neg Hx   . Suicidality Neg Hx     SOCIAL HISTORY:  reports that she has never smoked. She has never used smokeless tobacco. She reports that she drinks alcohol. She reports that she does not use drugs.  PERFORMANCE STATUS: The patient's performance status is 1 - Symptomatic but completely ambulatory  PHYSICAL EXAM: Most Recent Vital Signs: Blood pressure (!) 151/89, pulse 78, temperature 98.7 F (37.1 C), temperature source Oral, resp. rate 19, height 5' 4.5" (1.638 m), weight 216 lb 12.8 oz (98.3 kg), last menstrual period 09/07/2009, SpO2 100 %. BP (!) 151/89 (BP Location: Left Arm, Patient Position: Sitting)   Pulse 78   Temp 98.7 F (37.1 C) (Oral)   Resp 19   Ht 5' 4.5" (1.638 m)   Wt 216 lb 12.8 oz (98.3 kg)   LMP 09/07/2009   SpO2 100%   BMI 36.64 kg/m   General Appearance:    Alert, cooperative, no distress, appears stated age  Head:    Normocephalic, without obvious abnormality, atraumatic  Eyes:    PERRL, conjunctiva/corneas clear, EOM's intact, fundi    benign, both eyes  Ears:    Normal TM's and external ear canals, both ears  Nose:   Nares normal, septum midline, mucosa normal, no drainage    or sinus tenderness  Throat:   Lips, mucosa, and tongue normal; teeth and gums normal  Neck:    Supple, symmetrical, trachea midline, no adenopathy;    thyroid:  no enlargement/tenderness/nodules; no carotid   bruit or JVD  Back:     Symmetric, no curvature, ROM normal, no CVA tenderness  Lungs:     Clear to auscultation bilaterally, respirations unlabored  Chest Wall:    No tenderness or deformity   Heart:    Regular rate and rhythm, S1 and S2 normal, no murmur, rub   or gallop  Breast  Exam:    No tenderness, masses, or nipple abnormality  Abdomen:     Soft, non-tender, bowel sounds active all four quadrants,    no masses, no organomegaly  Genitalia:    Normal female without lesion, discharge or tenderness  Rectal:    Normal tone, normal prostate, no masses or tenderness;   guaiac negative stool  Extremities:   Extremities normal, atraumatic, no cyanosis or edema  Pulses:   2+ and symmetric all extremities  Skin:   Skin color, texture, turgor normal, no rashes or lesions  Lymph nodes:   Cervical, supraclavicular, and axillary nodes normal  Neurologic:   CNII-XII intact, normal strength, sensation and reflexes    throughout    LABORATORY DATA:  Results for orders placed or performed in visit on 02/13/18 (from the past 48 hour(s))  CBC with Differential ([place] Only)     Status: Abnormal   Collection Time: 02/13/18  2:59 PM  Result Value Ref Range   WBC Count 7.1 4.0 - 10.5 K/uL   RBC 4.29 3.87 - 5.11 MIL/uL   Hemoglobin 10.2 (L) 12.0 - 15.0 g/dL   HCT 34.7 (L) 36.0 - 46.0 %   MCV 80.9 80.0 - 100.0 fL   MCH 23.8 (L) 26.0 - 34.0 pg   MCHC 29.4 (L) 30.0 - 36.0 g/dL   RDW 15.8 (H) 11.5 - 15.5 %   Platelet Count 350 150 - 400 K/uL   nRBC 0.0 0.0 - 0.2 %   Neutrophils Relative % 58 %   Neutro Abs 4.1 1.7 - 7.7 K/uL   Band Neutrophils 0 %   Lymphocytes Relative 34 %   Lymphs Abs 2.4 0.7 - 4.0 K/uL   Monocytes Relative 7 %   Monocytes Absolute 0.5 0.1 - 1.0 K/uL   Eosinophils Relative 1 %   Eosinophils Absolute 0.1 0.0 - 0.5 K/uL   Basophils Relative 0 %   Basophils  Absolute 0.0 0.0 - 0.1 K/uL   Smear Review SMEAR STAINED AND AVAILABLE FOR REVIEW     Comment: Performed at [facility] at [facility], [address] [place], [place] [address]  Save Smear [facility])     Status: None   Collection Time: 02/13/18  2:59 PM  Result Value Ref Range   Smear Review SMEAR STAINED AND AVAILABLE FOR REVIEW     Comment: Performed at [facility] at [facility], [address], [place], [place] [address]  Reticulocytes     Status: None   Collection Time: 02/13/18  2:59 PM  Result Value Ref Range   Retic Ct Pct 1.6 0.4 - 3.1 %   RBC. 4.29 3.87 - 5.11 MIL/uL   Retic Count, Absolute 66.9 19.0 - 186.0 K/uL   Immature Retic Fract 11.5 2.3 - 15.9 %    Comment: Performed at [facility] at [facility], [address], [place], [place] [address]      RADIOGRAPHY: No results found.     PATHOLOGY: None  ASSESSMENT/PLAN: Patricia Bauer is a very pleasant 47 yo caucasian female with iron deficiency anemia secondary to malabsorption. She failed oral iron and is symptomatic as mentioned above.  We will see what her iron studies show and bring her back in for infusion this week and again next week if needed.  We will plan to see her back in another 6-8 weeks for follow-up and repeat labs at that time.  All questions were answered and she is in agreement with the plan. She will contact our office with any questions or concerns. We can certainly see her sooner if need be.   She was discussed with and also seen by Dr. Maylon Peppers and he is in agreement with the aforementioned.   Laverna Peace     Addendum:  I was present during the visit with NP Laverna Peace.  Briefly, patient reports that she does not regularly eat meat product due to dietary preferences.  She had a hysterectomy in the past.  She denies any symptoms of abnormal bleeding, such as epistaxis, hematemesis, hemoptysis, hematuria,  hematochezia or melena.  She had a colonoscopy in August 2019, which was normal.  She tried oral iron supplement in the past, but it caused significant GI upset.  I reviewed the patient's peripheral blood smear, which showed microcytic, hypochromic RBCs, consistent with iron deficiency anemia.  There was no schistocytosis.  WBC and platelet morphology was normal, and there was no dysplastic change.   Iron profile today was consistent with severe iron deficiency.  In light of the iron deficiency anemia secondary to decreased iron oral intake and the inability to tolerate oral iron supplement, we discussed with the patient regarding the benefits and some of the risks with IV iron infusion, including rare but potentially serious anaphylactic reaction.  The patient expressed understanding and agreed to proceed with treatment today.  Once patient completes IV iron infusion, we will see the patient back in 6 to 8 weeks to reassess RBC response.

## 2018-02-14 ENCOUNTER — Other Ambulatory Visit: Payer: Self-pay | Admitting: Family Medicine

## 2018-02-14 LAB — IRON AND TIBC
Iron: 27 ug/dL — ABNORMAL LOW (ref 41–142)
Saturation Ratios: 6 % — ABNORMAL LOW (ref 21–57)
TIBC: 426 ug/dL (ref 236–444)
UIBC: 399 ug/dL — ABNORMAL HIGH (ref 120–384)

## 2018-02-14 LAB — CMP (CANCER CENTER ONLY)
ALT: 25 U/L (ref 0–44)
AST: 24 U/L (ref 15–41)
Albumin: 3.5 g/dL (ref 3.5–5.0)
Alkaline Phosphatase: 81 U/L (ref 38–126)
Anion gap: 9 (ref 5–15)
BUN: 11 mg/dL (ref 6–20)
CO2: 27 mmol/L (ref 22–32)
CREATININE: 0.91 mg/dL (ref 0.44–1.00)
Calcium: 8.8 mg/dL — ABNORMAL LOW (ref 8.9–10.3)
Chloride: 103 mmol/L (ref 98–111)
GLUCOSE: 97 mg/dL (ref 70–99)
Potassium: 3.7 mmol/L (ref 3.5–5.1)
Sodium: 139 mmol/L (ref 135–145)
TOTAL PROTEIN: 7.2 g/dL (ref 6.5–8.1)
Total Bilirubin: <0.2 mg/dL — ABNORMAL LOW (ref 0.3–1.2)

## 2018-02-14 LAB — LACTATE DEHYDROGENASE: LDH: 168 U/L (ref 98–192)

## 2018-02-14 LAB — ERYTHROPOIETIN: Erythropoietin: 30.8 m[IU]/mL — ABNORMAL HIGH (ref 2.6–18.5)

## 2018-02-15 ENCOUNTER — Encounter: Payer: Self-pay | Admitting: Family

## 2018-02-15 LAB — FERRITIN: Ferritin: <4 ng/mL — ABNORMAL LOW (ref 11–307)

## 2018-02-16 ENCOUNTER — Other Ambulatory Visit: Payer: Self-pay | Admitting: Family

## 2018-02-16 DIAGNOSIS — D508 Other iron deficiency anemias: Secondary | ICD-10-CM

## 2018-02-16 DIAGNOSIS — D509 Iron deficiency anemia, unspecified: Secondary | ICD-10-CM | POA: Insufficient documentation

## 2018-02-19 ENCOUNTER — Encounter: Payer: Self-pay | Admitting: Family Medicine

## 2018-02-19 NOTE — Progress Notes (Signed)
Wellness Screening/thx dmf

## 2018-02-20 ENCOUNTER — Encounter: Payer: Self-pay | Admitting: Family Medicine

## 2018-02-21 ENCOUNTER — Telehealth: Payer: Self-pay | Admitting: Hematology & Oncology

## 2018-02-21 NOTE — Telephone Encounter (Signed)
lmom for pt to return call to office to sch iv iron appts per sch msg

## 2018-02-22 ENCOUNTER — Ambulatory Visit: Payer: Self-pay | Admitting: Hematology

## 2018-02-22 ENCOUNTER — Other Ambulatory Visit: Payer: Self-pay

## 2018-02-23 ENCOUNTER — Ambulatory Visit: Payer: 59 | Admitting: Psychology

## 2018-02-23 DIAGNOSIS — F410 Panic disorder [episodic paroxysmal anxiety] without agoraphobia: Secondary | ICD-10-CM | POA: Diagnosis not present

## 2018-02-28 ENCOUNTER — Telehealth: Payer: Self-pay | Admitting: Hematology & Oncology

## 2018-02-28 NOTE — Telephone Encounter (Signed)
Called patient again to see about scheduling iv iron appts. Pt stated she did not want sch any iron at this time due to financial reasons.

## 2018-03-04 ENCOUNTER — Other Ambulatory Visit: Payer: Self-pay | Admitting: Family Medicine

## 2018-03-09 ENCOUNTER — Telehealth: Payer: Self-pay | Admitting: Family

## 2018-03-09 ENCOUNTER — Ambulatory Visit: Payer: 59 | Admitting: Psychology

## 2018-03-09 DIAGNOSIS — F410 Panic disorder [episodic paroxysmal anxiety] without agoraphobia: Secondary | ICD-10-CM | POA: Diagnosis not present

## 2018-03-09 NOTE — Telephone Encounter (Signed)
pt called to cxl appts on 04/03/18. pt did not want to r/s at this time

## 2018-03-13 ENCOUNTER — Other Ambulatory Visit: Payer: Self-pay | Admitting: Family Medicine

## 2018-03-23 ENCOUNTER — Ambulatory Visit: Payer: 59 | Admitting: Psychology

## 2018-03-27 ENCOUNTER — Encounter: Payer: Self-pay | Admitting: Family Medicine

## 2018-04-03 ENCOUNTER — Other Ambulatory Visit: Payer: Self-pay

## 2018-04-03 ENCOUNTER — Ambulatory Visit: Payer: Self-pay | Admitting: Family

## 2018-04-06 ENCOUNTER — Ambulatory Visit: Payer: 59 | Admitting: Psychology

## 2018-04-20 ENCOUNTER — Ambulatory Visit: Payer: 59 | Admitting: Psychology

## 2018-04-20 DIAGNOSIS — F410 Panic disorder [episodic paroxysmal anxiety] without agoraphobia: Secondary | ICD-10-CM

## 2018-04-23 ENCOUNTER — Encounter: Payer: Self-pay | Admitting: Family Medicine

## 2018-04-23 ENCOUNTER — Ambulatory Visit (INDEPENDENT_AMBULATORY_CARE_PROVIDER_SITE_OTHER): Payer: 59 | Admitting: Family Medicine

## 2018-04-23 VITALS — BP 128/88 | HR 71 | Temp 99.0°F | Ht 65.0 in | Wt 212.4 lb

## 2018-04-23 DIAGNOSIS — M2550 Pain in unspecified joint: Secondary | ICD-10-CM | POA: Diagnosis not present

## 2018-04-23 DIAGNOSIS — D519 Vitamin B12 deficiency anemia, unspecified: Secondary | ICD-10-CM | POA: Diagnosis not present

## 2018-04-23 DIAGNOSIS — E559 Vitamin D deficiency, unspecified: Secondary | ICD-10-CM | POA: Diagnosis not present

## 2018-04-23 DIAGNOSIS — M791 Myalgia, unspecified site: Secondary | ICD-10-CM

## 2018-04-23 MED ORDER — FERROUS SULFATE 300 (60 FE) MG/5ML PO SYRP: ORAL_SOLUTION | ORAL | 3 refills | Status: DC

## 2018-04-23 NOTE — Assessment & Plan Note (Signed)
With myalgias. > 40 minutes spent in face to face time with patient, >50% spent in counselling or coordination of care. Etiology unclear at this. ? Vitamin D defiency along with iron deficiency anemia. ? Celiac. Start with labs today.  Orders Placed This Encounter  Procedures  . Vitamin D (25 hydroxy)  . Ferritin  . CBC with Differential/Platelet  . CK (Creatine Kinase)  . Comprehensive metabolic panel  . Sedimentation rate  . C-reactive protein  . Celiac panel 10

## 2018-04-23 NOTE — Patient Instructions (Addendum)
Great to see you. I will call you with your lab results from today and you can view them online.   We are starting liquid iron- 5- 10 ml three times weekly.  Please update me.

## 2018-04-23 NOTE — Assessment & Plan Note (Signed)
IV iron infusions were unfortunately cost prohibitive. She has tried numerous oral iron preparations.  She is willing to try liquid iron three times weekly (with juice to improve absorption) to see if that helps to minimize her side effects of abdominal pain and constipation.

## 2018-04-23 NOTE — Progress Notes (Signed)
Subjective:   [name], female    DOB: 09-12-1970, 48 y.o.   MRN: [id number]  Patricia Bauer is a pleasant 48 y.o. year old female who presents to clinic today with Generalized Body Aches (Patient is here today C/O body pain.  States that she stared having the wide spread body aches 3-4 months now.  She stopped gluten 3-4 weeks ago and her joint pain has decreased but is still present with the muscle aches.  Joint aches are bilaterally all the way down.  The muscle aches are bilaterally with shoulders down to antecubital spaces.)  on 04/23/2018  HPI:  Here to discuss "body pain."  Arthralgias and myalgias diffuse for the past 3-4 months. She stopped eating gluten 3 weeks ago and joint pain has decreased but muscle aches are still present.  Muscle aches are in her shoulders and radiate to her elbow bilaterally. No known injury. No new supplements or medications.  No recent physical activity.  Does have a h/o vitamin D deficiency. Vit D was 36.6. in 01/2018. Has not been taking Vitamin in over a month.  Does have iron deficiency anemia as well- referred to hematology for IV iron but she did not schedule infusions due to cost unfortunately.  She has been trying to increase iron in her diet.  Cannot tolerate any oral iron supplements.    Lab Results  Component Value Date   WBC 7.1 02/13/2018   HGB 10.2 (L) 02/13/2018   HCT 34.7 (L) 02/13/2018   MCV 80.9 02/13/2018   PLT 350 02/13/2018   Lab Results  Component Value Date   FERRITIN <4 (L) 02/13/2018   Depression screen PHQ 2/9 04/23/2018 09/04/2017 04/24/2017  Decreased Interest 0 0 1  Down, Depressed, Hopeless 0 0 1  PHQ - 2 Score 0 0 2  Altered sleeping - 2 3  Tired, decreased energy - 0 3  Change in appetite - 0 3  Feeling bad or failure about yourself  - 0 1  Trouble concentrating - 0 2  Moving slowly or fidgety/restless - 0 2  Suicidal thoughts - 0 0  PHQ-9 Score - 2 16  Difficult doing work/chores  - Not difficult at all Very difficult     Current Outpatient Medications on File Prior to Visit  Medication Sig Dispense Refill  . ALPRAZolam (XANAX) 0.25 MG tablet TAKE 1 TABLET BY MOUTH AT BEDTIME 30 tablet 0  . citalopram (CELEXA) 20 MG tablet TAKE 1 TABLET BY MOUTH  DAILY 90 tablet 0  . pantoprazole (PROTONIX) 40 MG tablet TAKE 1 TABLET BY MOUTH  DAILY 90 tablet 1  . SUMAtriptan (IMITREX) 50 MG tablet TAKE 1 TABLET MAY REPEAT IN 2 HOURS IF HEADACHE PERSISTS OR RECURS. DON'T USE MORE THAN TWO IN 24HRS 24 tablet 2  . traMADol (ULTRAM) 50 MG tablet TAKE 1 TO 2 TABLETS BY MOUTH EVERY 6 HOURS AS NEEDED FOR PAIN 30 tablet 0  . Vitamin D, Ergocalciferol, (DRISDOL) 50000 units CAPS capsule Take 50,000 Units by mouth once a week.  3   No current facility-administered medications on file prior to visit.     Allergies  Allergen Reactions  . Diflucan [Fluconazole] Other (See Comments)    Pelvic pain  . Onion Other (See Comments)    migraines   . Topamax [Topiramate] Other (See Comments)    Reaction:  Body aches    . Hydrocodone Anxiety, Palpitations and Other (See Comments)    Reaction:  Migraines  Past Medical History:  Diagnosis Date  . Anemia   . Anxiety   . Colon polyp   . Depression   . GERD (gastroesophageal reflux disease)   . Headache    migraines   . History of hysterectomy    still has both ovaries  . History of migraine headaches   . PONV (postoperative nausea and vomiting)     Past Surgical History:  Procedure Laterality Date  . ABDOMINAL HYSTERECTOMY    . COLONOSCOPY    . KNEE ARTHROPLASTY    . KNEE ARTHROSCOPY    . knee athroscopy  5/12   LT  . LAPAROSCOPIC VAGINAL HYSTERECTOMY  5/11   Supra cervical  . left partial knee replacement    . PARTIAL KNEE ARTHROPLASTY Left 02/10/2014   Procedure: LEFT MEDIAL UNICOMPARTMENTAL KNEE ARTHROPLASTY;  Surgeon: Gearlean Alf, MD;  Location: [facility];  Service: Orthopedics;  Laterality: Left;  . UPPER GI ENDOSCOPY     . WISDOM TOOTH EXTRACTION  1995    Family History  Problem Relation Age of Onset  . Stroke Father   . Heart attack Father   . Hyperlipidemia Mother   . Hypertension Mother   . Breast cancer Maternal Aunt   . Colon cancer Neg Hx   . Colon polyps Neg Hx   . Kidney disease Neg Hx   . Gallbladder disease Neg Hx   . Esophageal cancer Neg Hx   . Alcohol abuse Neg Hx   . Drug abuse Neg Hx   . Depression Neg Hx   . Bipolar disorder Neg Hx   . Anxiety disorder Neg Hx   . Schizophrenia Neg Hx   . Suicidality Neg Hx     Social History   Socioeconomic History  . Marital status: Single    Spouse name: Not on file  . Number of children: 1  . Years of education: Not on file  . Highest education level: Not on file  Occupational History  . Occupation: Careers information officer  Social Needs  . Financial resource strain: Not on file  . Food insecurity:    Worry: Not on file    Inability: Not on file  . Transportation needs:    Medical: Not on file    Non-medical: Not on file  Tobacco Use  . Smoking status: Never Smoker  . Smokeless tobacco: Never Used  Substance and Sexual Activity  . Alcohol use: Yes    Alcohol/week: 0.0 standard drinks    Comment: occassional- once a month has about 2-3 drinks  . Drug use: No  . Sexual activity: Never  Lifestyle  . Physical activity:    Days per week: Not on file    Minutes per session: Not on file  . Stress: Not on file  Relationships  . Social connections:    Talks on phone: Not on file    Gets together: Not on file    Attends religious service: Not on file    Active member of club or organization: Not on file    Attends meetings of clubs or organizations: Not on file    Relationship status: Not on file  . Intimate partner violence:    Fear of current or ex partner: Not on file    Emotionally abused: Not on file    Physically abused: Not on file    Forced sexual activity: Not on file  Other Topics Concern  . Not on file    Social History Narrative  . Not on  file   The PMH, [place], Social History, Family History, Medications, and allergies have been reviewed in [facility], and have been updated if relevant.   Review of Systems  Constitutional: Positive for fatigue.  HENT: Negative.   Eyes: Negative.   Cardiovascular: Negative.   Gastrointestinal: Negative.   Endocrine: Negative.   Genitourinary: Negative.   Musculoskeletal: Positive for arthralgias, joint swelling and myalgias. Negative for back pain, gait problem and neck pain.  Skin: Negative.   Allergic/Immunologic: Negative.   Neurological: Negative.   Hematological: Negative.   Psychiatric/Behavioral: Negative.   All other systems reviewed and are negative.      Objective:    BP 128/88 (BP Location: Left Arm, Patient Position: Sitting, Cuff Size: Normal)   Pulse 71   Temp 99 F (37.2 C) (Oral)   Ht 5\' 5"  (1.651 m)   Wt 212 lb 6.4 oz (96.3 kg)   LMP 09/07/2009   SpO2 98%   BMI 35.35 kg/m    Physical Exam Vitals signs and nursing note reviewed.  Constitutional:      General: She is not in acute distress.    Appearance: Normal appearance.  HENT:     Head: Normocephalic and atraumatic.     Nose: Nose normal.     Mouth/Throat:     Mouth: Mucous membranes are moist.  Eyes:     Extraocular Movements: Extraocular movements intact.  Neck:     Musculoskeletal: Normal range of motion.  Cardiovascular:     Rate and Rhythm: Normal rate and regular rhythm.  Pulmonary:     Effort: Pulmonary effort is normal.     Breath sounds: Normal breath sounds.  Abdominal:     General: Abdomen is flat.  Musculoskeletal: Normal range of motion.        General: No swelling, tenderness, deformity or signs of injury.  Skin:    General: Skin is warm and dry.  Neurological:     General: No focal deficit present.     Mental Status: She is alert and oriented to person, place, and time.  Psychiatric:        Mood and Affect: Mood normal.        Thought  Content: Thought content normal.        Judgment: Judgment normal.           Assessment & Plan:   Vitamin D deficiency  Myalgia - Plan: Vitamin D (25 hydroxy), CK (Creatine Kinase), Comprehensive metabolic panel  Arthralgia, unspecified joint  Anemia due to vitamin B12 deficiency, unspecified B12 deficiency type - Plan: Ferritin, CBC with Differential/Platelet No follow-ups on file.

## 2018-04-25 ENCOUNTER — Other Ambulatory Visit: Payer: Self-pay

## 2018-04-25 ENCOUNTER — Encounter: Payer: Self-pay | Admitting: Family Medicine

## 2018-04-25 LAB — COMPREHENSIVE METABOLIC PANEL
ALBUMIN: 4.2 g/dL (ref 3.5–5.5)
ALK PHOS: 86 IU/L (ref 39–117)
ALT: 20 IU/L (ref 0–32)
AST: 17 IU/L (ref 0–40)
Albumin/Globulin Ratio: 1.6 (ref 1.2–2.2)
BUN/Creatinine Ratio: 13 (ref 9–23)
BUN: 10 mg/dL (ref 6–24)
Bilirubin Total: <0.2 mg/dL (ref 0.0–1.2)
CALCIUM: 9.1 mg/dL (ref 8.7–10.2)
CO2: 21 mmol/L (ref 20–29)
CREATININE: 0.8 mg/dL (ref 0.57–1.00)
Chloride: 101 mmol/L (ref 96–106)
GFR calc Af Amer: 102 mL/min/{1.73_m2} (ref >59)
GFR, EST NON AFRICAN AMERICAN: 89 mL/min/{1.73_m2} (ref >59)
GLOBULIN, TOTAL: 2.7 g/dL (ref 1.5–4.5)
GLUCOSE: 91 mg/dL (ref 65–99)
Potassium: 4.1 mmol/L (ref 3.5–5.2)
SODIUM: 140 mmol/L (ref 134–144)
Total Protein: 6.9 g/dL (ref 6.0–8.5)

## 2018-04-25 LAB — CBC WITH DIFFERENTIAL/PLATELET
Basophils Absolute: 0 10*3/uL (ref 0.0–0.2)
Basos: 0 %
EOS (ABSOLUTE): 0.1 10*3/uL (ref 0.0–0.4)
Eos: 1 %
HEMATOCRIT: 34.9 % (ref 34.0–46.6)
Hemoglobin: 10.8 g/dL — ABNORMAL LOW (ref 11.1–15.9)
IMMATURE GRANS (ABS): 0 10*3/uL (ref 0.0–0.1)
IMMATURE GRANULOCYTES: 0 %
LYMPHS: 41 %
Lymphocytes Absolute: 3 10*3/uL (ref 0.7–3.1)
MCH: 24.1 pg — ABNORMAL LOW (ref 26.6–33.0)
MCHC: 30.9 g/dL — ABNORMAL LOW (ref 31.5–35.7)
MCV: 78 fL — AB (ref 79–97)
MONOCYTES: 7 %
Monocytes Absolute: 0.5 10*3/uL (ref 0.1–0.9)
Neutrophils Absolute: 3.6 10*3/uL (ref 1.4–7.0)
Neutrophils: 51 %
PLATELETS: 375 10*3/uL (ref 150–450)
RBC: 4.48 x10E6/uL (ref 3.77–5.28)
RDW: 14.4 % (ref 11.7–15.4)
WBC: 7.2 10*3/uL (ref 3.4–10.8)

## 2018-04-25 LAB — CELIAC PANEL 10
Antigliadin Abs, IgA: 7 units (ref 0–19)
Endomysial IgA: NEGATIVE
GLIADIN IGG: 3 U (ref 0–19)
IgA/Immunoglobulin A, Serum: 336 mg/dL (ref 87–352)

## 2018-04-25 LAB — FERRITIN: FERRITIN: 8 ng/mL — AB (ref 15–150)

## 2018-04-25 LAB — CK: Total CK: 59 U/L (ref 24–173)

## 2018-04-25 LAB — C-REACTIVE PROTEIN: CRP: 11 mg/L — AB (ref 0–10)

## 2018-04-25 LAB — SEDIMENTATION RATE: SED RATE: 44 mm/h — AB (ref 0–32)

## 2018-04-25 LAB — VITAMIN D 25 HYDROXY (VIT D DEFICIENCY, FRACTURES): Vit D, 25-Hydroxy: 25.9 ng/mL — ABNORMAL LOW (ref 30.0–100.0)

## 2018-05-01 ENCOUNTER — Other Ambulatory Visit: Payer: Self-pay | Admitting: Family Medicine

## 2018-05-03 NOTE — Telephone Encounter (Signed)
I am refilling electronically but please make sure she comes in to have CSC/UDS updated. How is she feeling?  Is the tolerating the iron better?

## 2018-05-04 ENCOUNTER — Ambulatory Visit: Payer: 59 | Admitting: Psychology

## 2018-05-04 DIAGNOSIS — F410 Panic disorder [episodic paroxysmal anxiety] without agoraphobia: Secondary | ICD-10-CM | POA: Diagnosis not present

## 2018-05-08 ENCOUNTER — Encounter: Payer: Self-pay | Admitting: Family Medicine

## 2018-05-18 ENCOUNTER — Ambulatory Visit: Payer: 59 | Admitting: Psychology

## 2018-05-18 DIAGNOSIS — F410 Panic disorder [episodic paroxysmal anxiety] without agoraphobia: Secondary | ICD-10-CM

## 2018-06-01 ENCOUNTER — Ambulatory Visit: Payer: 59 | Admitting: Psychology

## 2018-06-12 ENCOUNTER — Other Ambulatory Visit: Payer: Self-pay

## 2018-06-12 MED ORDER — ALPRAZOLAM 0.25 MG PO TABS: 0.2500 mg | ORAL_TABLET | Freq: Every day | ORAL | 1 refills | Status: DC

## 2018-06-12 NOTE — Telephone Encounter (Signed)
TA-Rx for alprazolam requested/LOV in Jan/NOV April/Per [place] PMP pt is compliant no red flags/plz advise/thx dmf

## 2018-06-15 ENCOUNTER — Ambulatory Visit: Payer: 59 | Admitting: Psychology

## 2018-06-15 DIAGNOSIS — F410 Panic disorder [episodic paroxysmal anxiety] without agoraphobia: Secondary | ICD-10-CM

## 2018-06-21 ENCOUNTER — Encounter: Payer: Self-pay | Admitting: Family Medicine

## 2018-06-21 ENCOUNTER — Ambulatory Visit (INDEPENDENT_AMBULATORY_CARE_PROVIDER_SITE_OTHER): Payer: 59 | Admitting: Family Medicine

## 2018-06-21 VITALS — BP 140/88 | HR 74 | Temp 98.2°F | Ht 65.0 in | Wt 214.6 lb

## 2018-06-21 DIAGNOSIS — M791 Myalgia, unspecified site: Secondary | ICD-10-CM

## 2018-06-21 DIAGNOSIS — E611 Iron deficiency: Secondary | ICD-10-CM | POA: Diagnosis not present

## 2018-06-21 DIAGNOSIS — E559 Vitamin D deficiency, unspecified: Secondary | ICD-10-CM

## 2018-06-21 DIAGNOSIS — M2550 Pain in unspecified joint: Secondary | ICD-10-CM | POA: Diagnosis not present

## 2018-06-21 MED ORDER — PREGABALIN 75 MG PO CAPS: 75.0000 mg | ORAL_CAPSULE | Freq: Two times a day (BID) | ORAL | 0 refills | Status: DC

## 2018-06-21 NOTE — Progress Notes (Signed)
Subjective:   [name], female    DOB: Oct 26, 1970, 48 y.o.   MRN: [id number]  Patricia Bauer is a pleasant 48 y.o. year old female who presents to clinic today with Follow-up (Patient is here today for a F/U.  She was last seen on 1.6.2020 for body aches.  Labs revealed low Vit-D and Rx for high dose sent in, Inflammatory markers were elevated, and Fe was very low.  She states that she does not feel any better as of yet and no change in body aches with the Vit-D & Fe.)  on 06/21/2018  HPI:  Here for follow up. I last saw patient on 04/23/18.  Note reviewed.  At that time she complained of worsening, intermittent arthralgias and myalgias ongoing for 3-4 months.  She felt the joint pain improved with a gluten free diet but the muscle aches were still present- mainly in her shoulders, radiate to elbows bilaterally. No known trauma.  She does have a iron deficiency anemia as well- referred to hematology for IV iron but she did not schedule infusions due to cost unfortunately.  She has been trying to increase iron in her diet.  Cannot tolerate any oral iron supplements.  She also does have a h/o vitamin D deficiency. Vit D was 36.6. in 01/2018. Had not been taking Vitamin in over a month at January OV. Celiac panel was negative.  Depression screen was negative.  Depression screen [facility] 2/9 04/23/2018 09/04/2017 04/24/2017  Decreased Interest 0 0 1  Down, Depressed, Hopeless 0 0 1  PHQ - 2 Score 0 0 2  Altered sleeping - 2 3  Tired, decreased energy - 0 3  Change in appetite - 0 3  Feeling bad or failure about yourself  - 0 1  Trouble concentrating - 0 2  Moving slowly or fidgety/restless - 0 2  Suicidal thoughts - 0 0  PHQ-9 Score - 2 16  Difficult doing work/chores - Not difficult at all Very difficult   Labs showed Vitamin D was 25.9- send in eRx for high dose Vitamin D- 50,000 IU weekly for 12 weeks.  She also had significant iron deficiency anemia with a ferritin  on 8 and CBC: Lab Results  Component Value Date   WBC 7.2 04/23/2018   HGB 10.8 (L) 04/23/2018   HCT 34.9 04/23/2018   MCV 78 (L) 04/23/2018   PLT 375 04/23/2018   CK was normal.  We decided a trial of liquid iron to see if it would help with tolerance and absorption. She is still taking it and noticing a little less pain. Current Outpatient Medications on File Prior to Visit  Medication Sig Dispense Refill  . ALPRAZolam (XANAX) 0.25 MG tablet Take 1 tablet (0.25 mg total) by mouth at bedtime. 30 tablet 1  . citalopram (CELEXA) 20 MG tablet TAKE 1 TABLET BY MOUTH  DAILY 90 tablet 0  . ferrous sulfate 300 (60 Fe) MG/5ML syrup 5-10 ml by mouth three times weekly 250 mL 3  . pantoprazole (PROTONIX) 40 MG tablet TAKE 1 TABLET BY MOUTH  DAILY 90 tablet 1  . SUMAtriptan (IMITREX) 50 MG tablet TAKE 1 TABLET MAY REPEAT IN 2 HOURS IF HEADACHE PERSISTS OR RECURS. DON'T USE MORE THAN TWO IN 24HRS 24 tablet 2  . traMADol (ULTRAM) 50 MG tablet TAKE 1-2 TABLETS BY MOUTH EVERY 6 HOURS AS NEEDED FOR PAIN 30 tablet 0  . Vitamin D, Ergocalciferol, (DRISDOL) 50000 units CAPS capsule Take 50,000  Units by mouth once a week.  3   No current facility-administered medications on file prior to visit.     Allergies  Allergen Reactions  . Diflucan [Fluconazole] Other (See Comments)    Pelvic pain  . Onion Other (See Comments)    migraines   . Topamax [Topiramate] Other (See Comments)    Reaction:  Body aches    . Hydrocodone Anxiety, Palpitations and Other (See Comments)    Reaction:  Migraines     Past Medical History:  Diagnosis Date  . Anemia   . Anxiety   . Colon polyp   . Depression   . GERD (gastroesophageal reflux disease)   . Headache    migraines   . History of hysterectomy    still has both ovaries  . History of migraine headaches   . PONV (postoperative nausea and vomiting)     Past Surgical History:  Procedure Laterality Date  . ABDOMINAL HYSTERECTOMY    . COLONOSCOPY    .  KNEE ARTHROPLASTY    . KNEE ARTHROSCOPY    . knee athroscopy  5/12   LT  . LAPAROSCOPIC VAGINAL HYSTERECTOMY  5/11   Supra cervical  . left partial knee replacement    . PARTIAL KNEE ARTHROPLASTY Left 02/10/2014   Procedure: LEFT MEDIAL UNICOMPARTMENTAL KNEE ARTHROPLASTY;  Surgeon: Gearlean Alf, MD;  Location: [facility];  Service: Orthopedics;  Laterality: Left;  . UPPER GI ENDOSCOPY    . WISDOM TOOTH EXTRACTION  1995    Family History  Problem Relation Age of Onset  . Stroke Father   . Heart attack Father   . Hyperlipidemia Mother   . Hypertension Mother   . Breast cancer Maternal Aunt   . Colon cancer Neg Hx   . Colon polyps Neg Hx   . Kidney disease Neg Hx   . Gallbladder disease Neg Hx   . Esophageal cancer Neg Hx   . Alcohol abuse Neg Hx   . Drug abuse Neg Hx   . Depression Neg Hx   . Bipolar disorder Neg Hx   . Anxiety disorder Neg Hx   . Schizophrenia Neg Hx   . Suicidality Neg Hx     Social History   Socioeconomic History  . Marital status: Single    Spouse name: Not on file  . Number of children: 1  . Years of education: Not on file  . Highest education level: Not on file  Occupational History  . Occupation: Careers information officer  Social Needs  . Financial resource strain: Not on file  . Food insecurity:    Worry: Not on file    Inability: Not on file  . Transportation needs:    Medical: Not on file    Non-medical: Not on file  Tobacco Use  . Smoking status: Never Smoker  . Smokeless tobacco: Never Used  Substance and Sexual Activity  . Alcohol use: Yes    Alcohol/week: 0.0 standard drinks    Comment: occassional- once a month has about 2-3 drinks  . Drug use: No  . Sexual activity: Never  Lifestyle  . Physical activity:    Days per week: Not on file    Minutes per session: Not on file  . Stress: Not on file  Relationships  . Social connections:    Talks on phone: Not on file    Gets together: Not on file    Attends religious  service: Not on file    Active member of club  or organization: Not on file    Attends meetings of clubs or organizations: Not on file    Relationship status: Not on file  . Intimate partner violence:    Fear of current or ex partner: Not on file    Emotionally abused: Not on file    Physically abused: Not on file    Forced sexual activity: Not on file  Other Topics Concern  . Not on file  Social History Narrative  . Not on file   The PMH, PSH, Social History, Family History, Medications, and allergies have been reviewed in [facility], and have been updated if relevant.  Review of Systems  Constitutional: Negative.   HENT: Negative.   Respiratory: Negative.   Cardiovascular: Negative.   Gastrointestinal: Negative.   Endocrine: Negative.   Musculoskeletal: Positive for arthralgias and neck pain.  Allergic/Immunologic: Negative.   Neurological: Negative.   Hematological: Negative.   Psychiatric/Behavioral: Negative.   All other systems reviewed and are negative.      Objective:    BP 140/88 (BP Location: Left Arm, Patient Position: Sitting, Cuff Size: Normal)   Pulse 74   Temp 98.2 F (36.8 C) (Oral)   Ht 5\' 5"  (1.651 m)   Wt 214 lb 9.6 oz (97.3 kg)   LMP 09/07/2009   SpO2 100%   BMI 35.71 kg/m    Physical Exam   General:  Well-developed,well-nourished,in no acute distress; alert,appropriate and cooperative throughout examination Head:  normocephalic and atraumatic.   Eyes:  vision grossly intact, PERRL Ears:  R ear normal and L ear normal externally, TMs clear bilaterally Nose:  no external deformity.   Mouth:  good dentition.   Neck:  No deformities, masses, or tenderness noted. Lungs:  Normal respiratory effort, chest expands symmetrically. Lungs are clear to auscultation, no crackles or wheezes. Heart:  Normal rate and regular rhythm. S1 and S2 normal without gallop, murmur, click, rub or other extra sounds. Abdomen:  Bowel sounds positive,abdomen soft and  non-tender without masses, organomegaly or hernias noted. Msk:  No deformity or scoliosis noted of thoracic or lumbar spine.   Extremities:  No clubbing, cyanosis, edema, or deformity noted with normal full range of motion of all joints.   Neurologic:  alert & oriented X3 and gait normal.   Skin:  Intact without suspicious lesions or rashes Cervical Nodes:  No lymphadenopathy noted Axillary Nodes:  No palpable lymphadenopathy Psych:  Cognition and judgment appear intact. Alert and cooperative with normal attention span and concentration. No apparent delusions, illusions, hallucinations       Assessment & Plan:   Low serum iron - Plan: Iron, TIBC and Ferritin Panel  Arthralgia, unspecified joint  Vitamin D deficiency  Myalgia No follow-ups on file.

## 2018-06-21 NOTE — Patient Instructions (Signed)
.  Great to see you. I will call you with your lab results from today and you can view them online.   We are starting Lyrica 75  Mg twice daily (okay to start with just taking it once a day).  Keep me updated in a few weeks.

## 2018-06-21 NOTE — Assessment & Plan Note (Signed)
>  40 minutes spent in face to face time with patient, >50% spent in counselling or coordination of care. Repeat iron levels today, she is tolerating this better. Start Lyrica 75 mg twice daily. She will update me in a few weeks.

## 2018-06-22 LAB — IRON,TIBC AND FERRITIN PANEL
Ferritin: 13 ng/mL — ABNORMAL LOW (ref 15–150)
Iron Saturation: 9 % — CL (ref 15–55)
Iron: 34 ug/dL (ref 27–159)
Total Iron Binding Capacity: 399 ug/dL (ref 250–450)
UIBC: 365 ug/dL (ref 131–425)

## 2018-06-27 ENCOUNTER — Encounter: Payer: Self-pay | Admitting: Family Medicine

## 2018-06-28 ENCOUNTER — Encounter: Payer: Self-pay | Admitting: Family Medicine

## 2018-06-29 ENCOUNTER — Other Ambulatory Visit: Payer: Self-pay

## 2018-06-29 ENCOUNTER — Ambulatory Visit: Payer: 59 | Admitting: Psychology

## 2018-06-29 ENCOUNTER — Encounter: Payer: Self-pay | Admitting: Family Medicine

## 2018-06-29 DIAGNOSIS — F410 Panic disorder [episodic paroxysmal anxiety] without agoraphobia: Secondary | ICD-10-CM | POA: Diagnosis not present

## 2018-07-01 ENCOUNTER — Other Ambulatory Visit: Payer: Self-pay | Admitting: Family Medicine

## 2018-07-05 ENCOUNTER — Encounter: Payer: Self-pay | Admitting: Family Medicine

## 2018-07-06 ENCOUNTER — Other Ambulatory Visit: Payer: Self-pay

## 2018-07-06 MED ORDER — CITALOPRAM HYDROBROMIDE 20 MG PO TABS: 20.0000 mg | ORAL_TABLET | Freq: Every day | ORAL | 2 refills | Status: DC

## 2018-07-09 ENCOUNTER — Other Ambulatory Visit: Payer: Self-pay

## 2018-07-09 ENCOUNTER — Encounter: Payer: Self-pay | Admitting: Family Medicine

## 2018-07-09 NOTE — Progress Notes (Signed)
TA-I have built in the new increased dosage/thx dmf

## 2018-07-12 ENCOUNTER — Other Ambulatory Visit: Payer: Self-pay | Admitting: Family Medicine

## 2018-07-12 DIAGNOSIS — N644 Mastodynia: Secondary | ICD-10-CM | POA: Diagnosis not present

## 2018-07-12 NOTE — Telephone Encounter (Signed)
See mychart message regarding new dose.

## 2018-07-12 NOTE — Telephone Encounter (Signed)
Requested medication (s) are due for refill today: Yes  Requested medication (s) are on the active medication list: Yes  Last refill:  06/21/18  Future visit scheduled: Yes  Notes to clinic:  Unable to refill, cannot delegate     Requested Prescriptions  Pending Prescriptions Disp Refills   pregabalin (LYRICA) 100 MG capsule 180 capsule 0    Sig: Take 1 capsule (100 mg total) by mouth 2 (two) times daily.     Not Delegated - Neurology:  Anticonvulsants - Controlled Failed - 07/12/2018  1:01 PM      Failed - This refill cannot be delegated      Passed - Valid encounter within last 12 months    Recent Outpatient Visits          3 weeks ago Myalgia   [facility], Marciano Sequin, MD   2 months ago Myalgia   [facility] Lucille Passy, MD   5 months ago Well woman exam without gynecological exam   [facility], Marciano Sequin, MD   7 months ago Soft tissue injury of chest wall, initial encounter   [facility], Charlene Brooke, NP   8 months ago GAD (generalized anxiety disorder)   [facility] Lucille Passy, MD      Future Appointments            In 2 weeks Lucille Passy, MD [facility]

## 2018-07-13 ENCOUNTER — Ambulatory Visit (INDEPENDENT_AMBULATORY_CARE_PROVIDER_SITE_OTHER): Payer: 59 | Admitting: Psychology

## 2018-07-13 DIAGNOSIS — F410 Panic disorder [episodic paroxysmal anxiety] without agoraphobia: Secondary | ICD-10-CM

## 2018-07-13 MED ORDER — PREGABALIN 100 MG PO CAPS: 100.0000 mg | ORAL_CAPSULE | Freq: Two times a day (BID) | ORAL | 0 refills | Status: DC

## 2018-07-18 ENCOUNTER — Encounter: Payer: Self-pay | Admitting: Family Medicine

## 2018-07-18 MED ORDER — PREGABALIN 100 MG PO CAPS: ORAL_CAPSULE | ORAL | 1 refills | Status: DC

## 2018-07-24 ENCOUNTER — Ambulatory Visit (INDEPENDENT_AMBULATORY_CARE_PROVIDER_SITE_OTHER): Payer: 59 | Admitting: Family Medicine

## 2018-07-24 DIAGNOSIS — M2550 Pain in unspecified joint: Secondary | ICD-10-CM | POA: Diagnosis not present

## 2018-07-24 NOTE — Progress Notes (Signed)
Virtual Visit via Video   I connected with Patricia Bauer on 07/24/18 at 10:00 AM EDT by a video enabled telemedicine application and verified that I am speaking with the correct person using two identifiers. Location patient: Home Location provider: [facility], Office Persons participating in the virtual visit: Jenetta Loges, MD   I discussed the limitations of evaluation and management by telemedicine and the availability of in person appointments. The patient expressed understanding and agreed to proceed.  Subjective:    HPI:   Follow up- Last saw patient in person for this on 06/21/18. Note reviewed. She does have a iron deficiency anemia as well- referred to hematology for IV iron but she did not schedule infusions due to cost unfortunately.  She has been trying to increase iron in her diet. Cannot tolerate any oral iron supplements.  She also does have a h/o vitamin D deficiency. Vit D was 36.6. in 01/2018. Had not been taking Vitamin in over a month at January OV. Celiac panel was negative.  Started her on Lyrica 75 mg twice daily at that time since her arthralgias were worsening.  Increased Lyrica to 100 mg twice daily on 07/15/18.  It is making her a bit dizzy but it is helping.  Also still needing to take Tramadol.  ROS: See pertinent positives and negatives per HPI.   Review of Systems  Constitutional: Positive for fatigue.  HENT: Negative.   Respiratory: Negative.   Cardiovascular: Negative.   Endocrine: Negative.   Genitourinary: Negative.   Musculoskeletal: Positive for arthralgias, back pain, gait problem, joint swelling and myalgias.  Skin: Negative.   All other systems reviewed and are negative.   Patient Active Problem List   Diagnosis Date Noted  . Myalgia 04/23/2018  . IDA (iron deficiency anemia) 02/16/2018  . Vitamin D deficiency 01/22/2018  . Right foot pain 01/22/2018  . History of partial knee replacement left  07/14/2017  . De Quervain's tenosynovitis, right 03/02/2017  . Metatarsalgia, right foot 03/02/2017  . Major depressive disorder, recurrent, severe without psychotic features ([place]) 01/15/2015  . GAD (generalized anxiety disorder) 01/15/2015  . Panic disorder without agoraphobia 01/15/2015  . Arthralgia 11/12/2014  . Hemorrhoid prolapse 11/09/2014  . Obesity 09/08/2014  . OA (osteoarthritis) of knee 02/10/2014  . Personal history of colonic polyps 10/16/2012  . History of headache 03/08/2012  . History of hysterectomy, supracervical 10/23/2011  . Anemia 09/08/2011  . GERD (gastroesophageal reflux disease) 09/08/2011  . Anxiety 09/08/2011  . Migraine headache 09/08/2011  . H/O: hysterectomy 09/08/2011    Social History   Tobacco Use  . Smoking status: Never Smoker  . Smokeless tobacco: Never Used  Substance Use Topics  . Alcohol use: Yes    Alcohol/week: 0.0 standard drinks    Comment: occassional- once a month has about 2-3 drinks    Current Outpatient Medications:  .  ALPRAZolam (XANAX) 0.25 MG tablet, Take 1 tablet (0.25 mg total) by mouth at bedtime., Disp: 30 tablet, Rfl: 1 .  citalopram (CELEXA) 20 MG tablet, Take 1 tablet (20 mg total) by mouth daily. (Patient taking differently: Take 30 mg by mouth daily. ), Disp: 90 tablet, Rfl: 2 .  ferrous sulfate 300 (60 Fe) MG/5ML syrup, 5-10 ml by mouth three times weekly, Disp: 250 mL, Rfl: 3 .  pantoprazole (PROTONIX) 40 MG tablet, TAKE 1 TABLET BY MOUTH  DAILY, Disp: 90 tablet, Rfl: 2 .  pregabalin (LYRICA) 100 MG capsule, Take 1 bid, Disp: 60  capsule, Rfl: 1 .  SUMAtriptan (IMITREX) 50 MG tablet, TAKE 1 TABLET MAY REPEAT IN 2 HOURS IF HEADACHE PERSISTS OR RECURS. DON'T USE MORE THAN TWO IN 24HRS, Disp: 24 tablet, Rfl: 2 .  traMADol (ULTRAM) 50 MG tablet, TAKE 1-2 TABLETS BY MOUTH EVERY 6 HOURS AS NEEDED FOR PAIN, Disp: 30 tablet, Rfl: 0 .  Vitamin D, Ergocalciferol, (DRISDOL) 50000 units CAPS capsule, Take 50,000 Units by mouth  once a week., Disp: , Rfl: 3  Allergies  Allergen Reactions  . Diflucan [Fluconazole] Other (See Comments)    Pelvic pain  . Onion Other (See Comments)    migraines   . Topamax [Topiramate] Other (See Comments)    Reaction:  Body aches    . Hydrocodone Anxiety, Palpitations and Other (See Comments)    Reaction:  Migraines     Objective:  LMP 09/07/2009   VITALS: Per patient if applicable, see vitals. GENERAL: Alert, appears well and in no acute distress. HEENT: Atraumatic, conjunctiva clear, no obvious abnormalities on inspection of external nose and ears. NECK: Normal movements of the head and neck. CARDIOPULMONARY: No increased WOB. Speaking in clear sentences. I:E ratio WNL.  MS: Moves all visible extremities without noticeable abnormality. PSYCH: Pleasant and cooperative, well-groomed. Speech normal rate and rhythm. Affect is appropriate. Insight and judgement are appropriate. Attention is focused, linear, and appropriate.  NEURO: CN grossly intact. Oriented as arrived to appointment on time with no prompting. Moves both UE equally.  SKIN: No obvious lesions, wounds, erythema, or cyanosis noted on face or hands.  Assessment and Plan:   Ashya was seen today for follow-up.  Diagnoses and all orders for this visit:  Arthralgia, unspecified joint    . Reviewed expectations re: course of current medical issues. . Discussed self-management of symptoms. . Outlined signs and symptoms indicating need for more acute intervention. . Patient verbalized understanding and all questions were answered. Marland Kitchen Health Maintenance issues including appropriate healthy diet, exercise, and smoking avoidance were discussed with patient. . See orders for this visit as documented in the electronic medical record.  Arnette Norris, MD 07/24/2018

## 2018-07-24 NOTE — Assessment & Plan Note (Signed)
Probable fibromyalgia along with Vitamin D deficiency and anemia. >15 minutes spent in face to face time with patient, >50% spent in counselling or coordination of care. Continue current dose of Lyrica for now.  She will update me next week as it is starting to help. The patient indicates understanding of these issues and agrees with the plan.

## 2018-07-27 ENCOUNTER — Ambulatory Visit (INDEPENDENT_AMBULATORY_CARE_PROVIDER_SITE_OTHER): Payer: 59 | Admitting: Psychology

## 2018-07-27 ENCOUNTER — Ambulatory Visit: Payer: 59 | Admitting: Psychology

## 2018-07-27 DIAGNOSIS — F410 Panic disorder [episodic paroxysmal anxiety] without agoraphobia: Secondary | ICD-10-CM

## 2018-07-30 ENCOUNTER — Encounter: Payer: Self-pay | Admitting: Family Medicine

## 2018-08-01 ENCOUNTER — Ambulatory Visit: Payer: Self-pay | Admitting: Family Medicine

## 2018-08-03 ENCOUNTER — Telehealth: Payer: Self-pay

## 2018-08-03 NOTE — Telephone Encounter (Signed)
Discussed paperwork and ensured that it was completed properly/e-mailed to her secure work email per her request at [email or web address]

## 2018-08-10 ENCOUNTER — Ambulatory Visit: Payer: 59 | Admitting: Psychology

## 2018-08-13 ENCOUNTER — Encounter: Payer: Self-pay | Admitting: Family Medicine

## 2018-08-16 ENCOUNTER — Encounter: Payer: Self-pay | Admitting: Family Medicine

## 2018-08-22 ENCOUNTER — Other Ambulatory Visit: Payer: Self-pay

## 2018-08-22 MED ORDER — TRAMADOL HCL 50 MG PO TABS: 50.0000 mg | ORAL_TABLET | Freq: Four times a day (QID) | ORAL | 5 refills | Status: DC | PRN

## 2018-08-22 NOTE — Telephone Encounter (Signed)
TA-Refill req received for Tramadol/LF: 1.16.20/LOV: 4.7.20/Per [place] PMP pt is compliant without red flags/thx dmf

## 2018-08-24 ENCOUNTER — Ambulatory Visit (INDEPENDENT_AMBULATORY_CARE_PROVIDER_SITE_OTHER): Payer: 59 | Admitting: Psychology

## 2018-08-24 DIAGNOSIS — F410 Panic disorder [episodic paroxysmal anxiety] without agoraphobia: Secondary | ICD-10-CM

## 2018-09-06 ENCOUNTER — Telehealth: Payer: Self-pay

## 2018-09-06 NOTE — Telephone Encounter (Signed)
Copied from [place] [id number]. Topic: General - Other >> Sep 06, 2018 10:36 AM Celene Kras A wrote: Reason for CRM: Pamala Hurry, from [employer], called and is requesting confirmation regarding a leave of absence note for pt. She states she was given a not that said pt could receive two office visits every month for up to two hours. Please advise.

## 2018-09-07 ENCOUNTER — Ambulatory Visit (INDEPENDENT_AMBULATORY_CARE_PROVIDER_SITE_OTHER): Payer: 59 | Admitting: Psychology

## 2018-09-07 DIAGNOSIS — F411 Generalized anxiety disorder: Secondary | ICD-10-CM | POA: Diagnosis not present

## 2018-09-19 ENCOUNTER — Other Ambulatory Visit: Payer: Self-pay

## 2018-09-19 DIAGNOSIS — G43809 Other migraine, not intractable, without status migrainosus: Secondary | ICD-10-CM

## 2018-09-19 MED ORDER — SUMATRIPTAN SUCCINATE 50 MG PO TABS: ORAL_TABLET | ORAL | 2 refills | Status: DC

## 2018-09-21 ENCOUNTER — Ambulatory Visit: Payer: 59 | Admitting: Psychology

## 2018-09-23 ENCOUNTER — Encounter: Payer: Self-pay | Admitting: Family Medicine

## 2018-09-24 ENCOUNTER — Other Ambulatory Visit: Payer: Self-pay

## 2018-09-24 ENCOUNTER — Other Ambulatory Visit: Payer: Self-pay | Admitting: Family Medicine

## 2018-09-24 DIAGNOSIS — D229 Melanocytic nevi, unspecified: Secondary | ICD-10-CM

## 2018-09-24 MED ORDER — FERROUS SULFATE 300 (60 FE) MG/5ML PO SYRP: ORAL_SOLUTION | ORAL | 11 refills | Status: DC

## 2018-09-28 ENCOUNTER — Other Ambulatory Visit: Payer: Self-pay

## 2018-09-28 ENCOUNTER — Telehealth: Payer: Self-pay | Admitting: Family Medicine

## 2018-09-28 MED ORDER — FERROUS SULFATE 300 (60 FE) MG/5ML PO SYRP: ORAL_SOLUTION | ORAL | 11 refills | Status: DC

## 2018-09-28 NOTE — Telephone Encounter (Signed)
TA-Plz see message below/pt called advising that current Fe Rx is not available any longer/They have Ferrous Sulfate 220mg /71mL delivers 44mg /plz send in if this is ok/thx dmf

## 2018-09-28 NOTE — Telephone Encounter (Signed)
Copied from [place] [id number]. Topic: Quick Communication - Rx Refill/Question >> Sep 28, 2018  1:35 PM Scherrie Gerlach wrote: Medication: ferrous sulfate 300 (60 Fe) MG/5ML syrup Pharmacy states this is not available anymore, they do not make this dosage.   However, it is available as  Ferrous sulfate 220 MG/5ML delivers 44 mg Pharmacy needs to know if OK o change.  [id number] [address], [place] [phone] (Phone) [phone] (Fax)

## 2018-09-30 NOTE — Telephone Encounter (Signed)
Yes okay to send in.  Thank you.

## 2018-10-02 MED ORDER — FERROUS SULFATE 220 (44 FE) MG/5ML PO LIQD: ORAL | 5 refills | Status: DC

## 2018-10-02 NOTE — Telephone Encounter (Signed)
Sent in new Rx/thx dmf

## 2018-10-04 ENCOUNTER — Ambulatory Visit: Payer: 59 | Admitting: Psychology

## 2018-10-05 ENCOUNTER — Ambulatory Visit (INDEPENDENT_AMBULATORY_CARE_PROVIDER_SITE_OTHER): Payer: 59 | Admitting: Psychology

## 2018-10-05 DIAGNOSIS — F410 Panic disorder [episodic paroxysmal anxiety] without agoraphobia: Secondary | ICD-10-CM | POA: Diagnosis not present

## 2018-10-10 ENCOUNTER — Encounter: Payer: Self-pay | Admitting: Family Medicine

## 2018-10-17 ENCOUNTER — Encounter: Payer: Self-pay | Admitting: Family Medicine

## 2018-10-20 ENCOUNTER — Telehealth: Payer: Self-pay

## 2018-10-20 ENCOUNTER — Other Ambulatory Visit: Payer: Self-pay

## 2018-10-20 ENCOUNTER — Encounter: Payer: Self-pay | Admitting: Emergency Medicine

## 2018-10-20 ENCOUNTER — Ambulatory Visit
Admission: EM | Admit: 2018-10-20 | Discharge: 2018-10-20 | Disposition: A | Discharge status: Home / Self Care | Payer: 59 | Attending: Family Medicine | Admitting: Family Medicine

## 2018-10-20 DIAGNOSIS — Z20822 Contact with and (suspected) exposure to covid-19: Secondary | ICD-10-CM

## 2018-10-20 DIAGNOSIS — R6884 Jaw pain: Secondary | ICD-10-CM | POA: Diagnosis not present

## 2018-10-20 DIAGNOSIS — J029 Acute pharyngitis, unspecified: Secondary | ICD-10-CM | POA: Diagnosis not present

## 2018-10-20 DIAGNOSIS — K118 Other diseases of salivary glands: Secondary | ICD-10-CM

## 2018-10-20 LAB — RAPID STREP SCREEN (MED CTR MEBANE ONLY): Streptococcus, Group A Screen (Direct): NEGATIVE

## 2018-10-20 NOTE — Telephone Encounter (Signed)
LM on VM to call back to schedule Covid testing.

## 2018-10-20 NOTE — Telephone Encounter (Signed)
-----   Message from Norval Gable, MD sent at 10/20/2018  2:50 PM EDT ----- Regarding: covid testing needed symptoms

## 2018-10-20 NOTE — Addendum Note (Signed)
Addended by: Carlisle Beers on: 10/20/2018 03:27 PM   Modules accepted: Orders

## 2018-10-20 NOTE — ED Provider Notes (Signed)
[facility]    CSN: [id number] Arrival date & time: 10/20/18  1331     History   Chief Complaint Chief Complaint  Patient presents with  . Sore Throat    HPI Patricia Bauer is a 48 y.o. female.   49 yo female a c/o throat discomfort that "feels more on the outside" (under the jaw). States she does feel discomfort when swallowing. Denies any fevers, chills, congestion, cough.    Sore Throat    Past Medical History:  Diagnosis Date  . Anemia   . Anxiety   . Colon polyp   . Depression   . GERD (gastroesophageal reflux disease)   . Headache    migraines   . History of hysterectomy    still has both ovaries  . History of migraine headaches   . PONV (postoperative nausea and vomiting)     Patient Active Problem List   Diagnosis Date Noted  . Myalgia 04/23/2018  . IDA (iron deficiency anemia) 02/16/2018  . Vitamin D deficiency 01/22/2018  . Right foot pain 01/22/2018  . History of partial knee replacement left 07/14/2017  . De Quervain's tenosynovitis, right 03/02/2017  . Metatarsalgia, right foot 03/02/2017  . Major depressive disorder, recurrent, severe without psychotic features ([place]) 01/15/2015  . GAD (generalized anxiety disorder) 01/15/2015  . Panic disorder without agoraphobia 01/15/2015  . Arthralgia 11/12/2014  . Hemorrhoid prolapse 11/09/2014  . Obesity 09/08/2014  . OA (osteoarthritis) of knee 02/10/2014  . Personal history of colonic polyps 10/16/2012  . History of headache 03/08/2012  . History of hysterectomy, supracervical 10/23/2011  . Anemia 09/08/2011  . GERD (gastroesophageal reflux disease) 09/08/2011  . Anxiety 09/08/2011  . Migraine headache 09/08/2011  . H/O: hysterectomy 09/08/2011    Past Surgical History:  Procedure Laterality Date  . ABDOMINAL HYSTERECTOMY    . COLONOSCOPY    . KNEE ARTHROPLASTY    . KNEE ARTHROSCOPY    . knee athroscopy  5/12   LT  . LAPAROSCOPIC VAGINAL HYSTERECTOMY  5/11   Supra cervical   . left partial knee replacement    . PARTIAL KNEE ARTHROPLASTY Left 02/10/2014   Procedure: LEFT MEDIAL UNICOMPARTMENTAL KNEE ARTHROPLASTY;  Surgeon: Gearlean Alf, MD;  Location: [facility];  Service: Orthopedics;  Laterality: Left;  . UPPER GI ENDOSCOPY    . WISDOM TOOTH EXTRACTION  1995    OB History    Gravida  4   Para  1   Term  1   Preterm      AB  2   Living  1     SAB  2   TAB      Ectopic      Multiple      Live Births               Home Medications    Prior to Admission medications   Medication Sig Start Date End Date Taking? Authorizing Provider  ALPRAZolam (XANAX) 0.25 MG tablet Take 1 tablet (0.25 mg total) by mouth at bedtime. 06/12/18  Yes Lucille Passy, MD  citalopram (CELEXA) 20 MG tablet Take 1 tablet (20 mg total) by mouth daily. Patient taking differently: Take 30 mg by mouth daily.  07/06/18  Yes Lucille Passy, MD  Ferrous Sulfate 220 (44 Fe) MG/5ML LIQD 5-6mL 3 times weekly 10/02/18  Yes Lucille Passy, MD  pantoprazole (PROTONIX) 40 MG tablet TAKE 1 TABLET BY MOUTH  DAILY 07/02/18  Yes Lucille Passy, MD  SUMAtriptan (IMITREX) 50 MG tablet TAKE 1 TABLET MAY REPEAT IN 2 HOURS IF HEADACHE PERSISTS OR RECURS. DON'T USE MORE THAN TWO IN 24HRS 09/19/18  Yes Lucille Passy, MD  traMADol (ULTRAM) 50 MG tablet Take 1-2 tablets (50-100 mg total) by mouth every 6 (six) hours as needed. for pain 08/22/18  Yes Lucille Passy, MD  pregabalin (LYRICA) 100 MG capsule Take 1 bid 07/18/18   Lucille Passy, MD  Vitamin D, Ergocalciferol, (DRISDOL) 50000 units CAPS capsule Take 50,000 Units by mouth once a week. 06/27/17   [provider]    Family History Family History  Problem Relation Age of Onset  . Stroke Father   . Heart attack Father   . Hyperlipidemia Mother   . Hypertension Mother   . Breast cancer Maternal Aunt   . Colon cancer Neg Hx   . Colon polyps Neg Hx   . Kidney disease Neg Hx   . Gallbladder disease Neg Hx   . Esophageal cancer Neg Hx    . Alcohol abuse Neg Hx   . Drug abuse Neg Hx   . Depression Neg Hx   . Bipolar disorder Neg Hx   . Anxiety disorder Neg Hx   . Schizophrenia Neg Hx   . Suicidality Neg Hx     Social History Social History   Tobacco Use  . Smoking status: Never Smoker  . Smokeless tobacco: Never Used  Substance Use Topics  . Alcohol use: Yes    Alcohol/week: 0.0 standard drinks    Comment: occassional- once a month has about 2-3 drinks  . Drug use: No     Allergies   Diflucan [fluconazole], Onion, Topamax [topiramate], and Hydrocodone   Review of Systems Review of Systems   Physical Exam Triage Vital Signs ED Triage Vitals  Enc Vitals Group     BP 10/20/18 1348 136/84     Pulse Rate 10/20/18 1348 78     Resp 10/20/18 1348 18     Temp 10/20/18 1348 98.5 F (36.9 C)     Temp Source 10/20/18 1348 Oral     SpO2 10/20/18 1348 97 %     Weight 10/20/18 1346 211 lb (95.7 kg)     Height 10/20/18 1346 5\' 4"  (1.626 m)     Head Circumference --      Peak Flow --      Pain Score 10/20/18 1345 3     Pain Loc --      Pain Edu? --      Excl. in [place]? --    No data found.  Updated Vital Signs BP 136/84 (BP Location: Right Arm)   Pulse 78   Temp 98.5 F (36.9 C) (Oral)   Resp 18   Ht 5\' 4"  (1.626 m)   Wt 95.7 kg   LMP 09/07/2009   SpO2 97%   BMI 36.22 kg/m   Visual Acuity Right Eye Distance:   Left Eye Distance:   Bilateral Distance:    Right Eye Near:   Left Eye Near:    Bilateral Near:     Physical Exam Vitals signs and nursing note reviewed.  Constitutional:      General: She is not in acute distress.    Appearance: She is not toxic-appearing or diaphoretic.  HENT:     Head:     Salivary Glands: Right salivary gland is tender (submandibular). Left salivary gland is tender (submandibular).     Nose: No congestion or rhinorrhea.  Mouth/Throat:     Mouth: No oral lesions.     Pharynx: Posterior oropharyngeal erythema present. No pharyngeal swelling, oropharyngeal  exudate or uvula swelling.     Tonsils: No tonsillar exudate or tonsillar abscesses.  Neck:     Musculoskeletal: Neck supple. No neck rigidity.  Neurological:     Mental Status: She is alert.      UC Treatments / Results  Labs (all labs ordered are listed, but only abnormal results are displayed) Labs Reviewed  RAPID STREP SCREEN ([facility] ONLY)  CULTURE, GROUP A STREP [facility])    EKG   Radiology No results found.  Procedures Procedures (including critical care time)  Medications Ordered in UC Medications - No data to display  Initial Impression / Assessment and Plan / UC Course  I have reviewed the triage vital signs and the nursing notes.  Pertinent labs & imaging results that were available during my care of the patient were reviewed by me and considered in my medical decision making (see chart for details).      Final Clinical Impressions(s) / UC Diagnoses   Final diagnoses:  Pain of submandibular gland  Sore throat     Discharge Instructions     Over the counter tylenol as needed    ED Prescriptions    None     1. Lab results and diagnosis reviewed with patient 2. Recommend supportive treatment as above 3. Referral for covid testing 4. Follow-up prn if symptoms worsen or don't improve  Controlled Substance Prescriptions [place] Controlled Substance Registry consulted? Not Applicable   Norval Gable, MD 10/20/18 1500

## 2018-10-20 NOTE — ED Triage Notes (Signed)
Patient c/o throat being sore on the "outside" that started yesterday. She states her throat does not hurt on the inside.

## 2018-10-20 NOTE — Discharge Instructions (Signed)
Over the counter tylenol as needed

## 2018-10-20 NOTE — Telephone Encounter (Signed)
Pt called back and schedule appt for 10/23/18 at 12:00 at [facility]. Pt informed to wear mask to testing site and to stay in car for testing. Address of testing site given to pt. Pt verbalized understanding.

## 2018-10-23 ENCOUNTER — Other Ambulatory Visit: Payer: Self-pay

## 2018-10-23 DIAGNOSIS — Z20822 Contact with and (suspected) exposure to covid-19: Secondary | ICD-10-CM

## 2018-10-23 LAB — CULTURE, GROUP A STREP (THRC)

## 2018-10-28 ENCOUNTER — Encounter: Payer: Self-pay | Admitting: Family Medicine

## 2018-10-28 LAB — NOVEL CORONAVIRUS, NAA: SARS-CoV-2, NAA: NOT DETECTED

## 2018-10-30 ENCOUNTER — Other Ambulatory Visit: Payer: Self-pay | Admitting: Family Medicine

## 2018-10-30 MED ORDER — PROPRANOLOL HCL ER 60 MG PO CP24: 60.0000 mg | ORAL_CAPSULE | Freq: Every day | ORAL | 1 refills | Status: DC

## 2018-10-31 ENCOUNTER — Encounter (HOSPITAL_COMMUNITY): Payer: Self-pay

## 2018-11-02 ENCOUNTER — Ambulatory Visit (INDEPENDENT_AMBULATORY_CARE_PROVIDER_SITE_OTHER): Payer: 59 | Admitting: Psychology

## 2018-11-02 DIAGNOSIS — F410 Panic disorder [episodic paroxysmal anxiety] without agoraphobia: Secondary | ICD-10-CM

## 2018-11-23 ENCOUNTER — Other Ambulatory Visit: Payer: Self-pay | Admitting: Family Medicine

## 2018-11-26 ENCOUNTER — Encounter: Payer: Self-pay | Admitting: Family Medicine

## 2018-11-28 ENCOUNTER — Other Ambulatory Visit: Payer: Self-pay

## 2018-11-28 MED ORDER — PROPRANOLOL HCL 40 MG PO TABS: ORAL_TABLET | ORAL | 0 refills | Status: DC

## 2018-11-28 NOTE — Progress Notes (Signed)
Per TA-Levonia Wolfley/C ER/Start IR of Propranolol 40mg  1/2 bid/pt aware/thx dmf

## 2018-11-30 ENCOUNTER — Ambulatory Visit: Payer: Self-pay | Admitting: Psychology

## 2018-12-03 ENCOUNTER — Other Ambulatory Visit: Payer: Self-pay

## 2018-12-03 MED ORDER — PROPRANOLOL HCL 40 MG PO TABS: ORAL_TABLET | ORAL | 1 refills | Status: DC

## 2018-12-14 ENCOUNTER — Ambulatory Visit (INDEPENDENT_AMBULATORY_CARE_PROVIDER_SITE_OTHER): Payer: 59 | Admitting: Psychology

## 2018-12-14 DIAGNOSIS — F410 Panic disorder [episodic paroxysmal anxiety] without agoraphobia: Secondary | ICD-10-CM | POA: Diagnosis not present

## 2018-12-19 ENCOUNTER — Encounter: Payer: Self-pay | Admitting: Family Medicine

## 2018-12-19 ENCOUNTER — Other Ambulatory Visit: Payer: Self-pay | Admitting: Family Medicine

## 2018-12-19 MED ORDER — CITALOPRAM HYDROBROMIDE 40 MG PO TABS: 20.0000 mg | ORAL_TABLET | Freq: Every day | ORAL | 3 refills | Status: DC

## 2018-12-22 ENCOUNTER — Other Ambulatory Visit: Payer: Self-pay | Admitting: Family Medicine

## 2018-12-26 ENCOUNTER — Encounter: Payer: 59 | Admitting: Family Medicine

## 2018-12-28 ENCOUNTER — Ambulatory Visit (INDEPENDENT_AMBULATORY_CARE_PROVIDER_SITE_OTHER): Payer: 59 | Admitting: Psychology

## 2018-12-28 DIAGNOSIS — F410 Panic disorder [episodic paroxysmal anxiety] without agoraphobia: Secondary | ICD-10-CM

## 2018-12-31 ENCOUNTER — Other Ambulatory Visit: Payer: Self-pay | Admitting: Family Medicine

## 2019-01-08 ENCOUNTER — Encounter: Payer: Self-pay | Admitting: Family Medicine

## 2019-01-09 ENCOUNTER — Encounter: Payer: Self-pay | Admitting: Family Medicine

## 2019-01-11 ENCOUNTER — Ambulatory Visit: Payer: 59 | Admitting: Psychology

## 2019-01-11 MED ORDER — CITALOPRAM HYDROBROMIDE 40 MG PO TABS: 40.0000 mg | ORAL_TABLET | Freq: Every day | ORAL | 3 refills | Status: DC

## 2019-01-15 ENCOUNTER — Other Ambulatory Visit: Payer: Self-pay

## 2019-01-15 ENCOUNTER — Ambulatory Visit (INDEPENDENT_AMBULATORY_CARE_PROVIDER_SITE_OTHER): Payer: 59 | Admitting: Family Medicine

## 2019-01-15 ENCOUNTER — Encounter: Payer: Self-pay | Admitting: Family Medicine

## 2019-01-15 DIAGNOSIS — M791 Myalgia, unspecified site: Secondary | ICD-10-CM

## 2019-01-15 DIAGNOSIS — R5383 Other fatigue: Secondary | ICD-10-CM | POA: Diagnosis not present

## 2019-01-15 DIAGNOSIS — D519 Vitamin B12 deficiency anemia, unspecified: Secondary | ICD-10-CM

## 2019-01-15 DIAGNOSIS — Z90711 Acquired absence of uterus with remaining cervical stump: Secondary | ICD-10-CM

## 2019-01-15 NOTE — Assessment & Plan Note (Signed)
I suspect this is multifactorial- anemia, fibromyalgia, possible vitamin deficiency- will start with labs today. The patient indicates understanding of these issues and agrees with the plan. Orders Placed This Encounter  Procedures  . Vitamin D (25 hydroxy)  . Ferritin  . CK (Creatine Kinase)  . Sedimentation rate  . CBC with Differential/Platelet  . C-reactive protein  . Pathologist smear review  . B12

## 2019-01-15 NOTE — Progress Notes (Signed)
Virtual Visit via Video   Due to the COVID-19 pandemic, this visit was completed with telemedicine (audio/video) technology to reduce patient and provider exposure as well as to preserve personal protective equipment.   I connected with Patricia Bauer by a video enabled telemedicine application and verified that I am speaking with the correct person using two identifiers. Location patient: Home Location provider: [facility], Office Persons participating in the virtual visit: Jenetta Loges, MD   I discussed the limitations of evaluation and management by telemedicine and the availability of in person appointments. The patient expressed understanding and agreed to proceed.  Care Team   Patient Care Team: Lucille Passy, MD as PCP - General (Family Medicine)  Subjective:   HPI:   Fatigue- she has fatigue that is "24/7."   ? If this is due to anemia? Migraines have increased in frequency.  Denies cough, SOB or sinus symptoms.  Just restarted her iron supplement again 3 weeks ago as it makes her feel sick to her stomach.  We referred her for iron infusions but they were cost prohibitive.    Her muscles ache with exertion, especially in her arms and shoulders more so in the last couple of months.  She can sleep for 12 hours per day and still feel tired.  PMH significant for iron deficiency anemia and ? Fibromyalgia. Lab Results  Component Value Date   WBC 7.2 04/23/2018   HGB 10.8 (L) 04/23/2018   HCT 34.9 04/23/2018   MCV 78 (L) 04/23/2018   PLT 375 04/23/2018   Iron/TIBC/Ferritin/ %Sat    Component Value Date/Time   IRON 34 06/21/2018 1314   TIBC 399 06/21/2018 1314   FERRITIN 13 (L) 06/21/2018 1314   IRONPCTSAT 9 (LL) 06/21/2018 1314   Last menstrual period 09/07/2009.   Review of Systems  Constitutional: Positive for malaise/fatigue.  HENT: Negative.   Respiratory: Negative.   Cardiovascular: Negative.   Gastrointestinal: Negative.    Genitourinary: Negative.   Musculoskeletal: Positive for myalgias. Negative for back pain, falls, joint pain and neck pain.  Skin: Negative.   Neurological: Negative.   Endo/Heme/Allergies: Negative.   Psychiatric/Behavioral: Negative.   All other systems reviewed and are negative.    Patient Active Problem List   Diagnosis Date Noted  . Fatigue 01/15/2019  . Myalgia 04/23/2018  . IDA (iron deficiency anemia) 02/16/2018  . Vitamin D deficiency 01/22/2018  . Right foot pain 01/22/2018  . History of partial knee replacement left 07/14/2017  . De Quervain's tenosynovitis, right 03/02/2017  . Metatarsalgia, right foot 03/02/2017  . Major depressive disorder, recurrent, severe without psychotic features ([place]) 01/15/2015  . GAD (generalized anxiety disorder) 01/15/2015  . Panic disorder without agoraphobia 01/15/2015  . Arthralgia 11/12/2014  . Hemorrhoid prolapse 11/09/2014  . Obesity 09/08/2014  . OA (osteoarthritis) of knee 02/10/2014  . Personal history of colonic polyps 10/16/2012  . History of headache 03/08/2012  . History of hysterectomy, supracervical 10/23/2011  . Anemia 09/08/2011  . GERD (gastroesophageal reflux disease) 09/08/2011  . Anxiety 09/08/2011  . Migraine headache 09/08/2011  . H/O: hysterectomy 09/08/2011    Social History   Tobacco Use  . Smoking status: Never Smoker  . Smokeless tobacco: Never Used  Substance Use Topics  . Alcohol use: Yes    Alcohol/week: 0.0 standard drinks    Comment: occassional- once a month has about 2-3 drinks    Current Outpatient Medications:  .  ALPRAZolam (XANAX) 0.25 MG tablet, Take 1  tablet (0.25 mg total) by mouth at bedtime., Disp: 30 tablet, Rfl: 1 .  citalopram (CELEXA) 40 MG tablet, Take 1 tablet (40 mg total) by mouth daily., Disp: 90 tablet, Rfl: 3 .  Ferrous Sulfate 220 (44 Fe) MG/5ML LIQD, 5-76mL 3 times weekly, Disp: 473 mL, Rfl: 5 .  pantoprazole (PROTONIX) 40 MG tablet, TAKE 1 TABLET BY MOUTH  DAILY, Disp:  90 tablet, Rfl: 2 .  propranolol (INDERAL) 40 MG tablet, TAKE 1/2 TABLET TWICE A DAY (D/C ER/START IR), Disp: 30 tablet, Rfl: 1 .  SUMAtriptan (IMITREX) 50 MG tablet, TAKE 1 TABLET MAY REPEAT IN 2 HOURS IF HEADACHE PERSISTS OR RECURS. DON'T USE MORE THAN TWO IN 24HRS, Disp: 90 tablet, Rfl: 2 .  traMADol (ULTRAM) 50 MG tablet, Take 1-2 tablets (50-100 mg total) by mouth every 6 (six) hours as needed. for pain, Disp: 30 tablet, Rfl: 5  Allergies  Allergen Reactions  . Diflucan [Fluconazole] Other (See Comments)    Pelvic pain  . Onion Other (See Comments)    migraines   . Topamax [Topiramate] Other (See Comments)    Reaction:  Body aches    . Hydrocodone Anxiety, Palpitations and Other (See Comments)    Reaction:  Migraines     Objective:   VITALS: Per patient if applicable, see vitals. GENERAL: Alert, appears well and in no acute distress. HEENT: Atraumatic, conjunctiva clear, no obvious abnormalities on inspection of external nose and ears. NECK: Normal movements of the head and neck. CARDIOPULMONARY: No increased WOB. Speaking in clear sentences. I:E ratio WNL.  MS: Moves all visible extremities without noticeable abnormality. PSYCH: Pleasant and cooperative, well-groomed. Speech normal rate and rhythm. Affect is appropriate. Insight and judgement are appropriate. Attention is focused, linear, and appropriate.  NEURO: CN grossly intact. Oriented as arrived to appointment on time with no prompting. Moves both UE equally.  SKIN: No obvious lesions, wounds, erythema, or cyanosis noted on face or hands.  Depression screen [facility] 2/9 07/24/2018 04/23/2018 09/04/2017  Decreased Interest 0 0 0  Down, Depressed, Hopeless 0 0 0  PHQ - 2 Score 0 0 0  Altered sleeping - - 2  Tired, decreased energy - - 0  Change in appetite - - 0  Feeling bad or failure about yourself  - - 0  Trouble concentrating - - 0  Moving slowly or fidgety/restless - - 0  Suicidal thoughts - - 0  PHQ-9 Score - - 2   Difficult doing work/chores - - Not difficult at all    Assessment and Plan:   Paitin was seen today for fatigue.  Diagnoses and all orders for this visit:  Anemia due to vitamin B12 deficiency, unspecified B12 deficiency type -     CK (Creatine Kinase); Future -     CBC with Differential/Platelet; Future -     Pathologist smear review; Future -     B12; Future  Other fatigue -     Vitamin D (25 hydroxy); Future -     Ferritin; Future -     Sedimentation rate; Future -     CBC with Differential/Platelet; Future -     B12; Future  History of hysterectomy, supracervical  Myalgia -     Sedimentation rate; Future -     C-reactive protein; Future    . COVID-19 Education: The signs and symptoms of COVID-19 were discussed with the patient and how to seek care for testing if needed. The importance of social distancing was discussed today. Marland Kitchen  Reviewed expectations re: course of current medical issues. . Discussed self-management of symptoms. . Outlined signs and symptoms indicating need for more acute intervention. . Patient verbalized understanding and all questions were answered. Marland Kitchen Health Maintenance issues including appropriate healthy diet, exercise, and smoking avoidance were discussed with patient. . See orders for this visit as documented in the electronic medical record.  Arnette Norris, MD  Records requested if needed. Time spent: 15 minutes, of which >50% was spent in obtaining information about her symptoms, reviewing her previous labs, evaluations, and treatments, counseling her about her condition (please see the discussed topics above), and developing a plan to further investigate it; she had a number of questions which I addressed.

## 2019-01-16 ENCOUNTER — Telehealth: Payer: Self-pay

## 2019-01-16 ENCOUNTER — Encounter: Payer: Self-pay | Admitting: Family Medicine

## 2019-01-16 NOTE — Telephone Encounter (Signed)

## 2019-01-17 ENCOUNTER — Other Ambulatory Visit (INDEPENDENT_AMBULATORY_CARE_PROVIDER_SITE_OTHER): Payer: 59

## 2019-01-17 ENCOUNTER — Ambulatory Visit (INDEPENDENT_AMBULATORY_CARE_PROVIDER_SITE_OTHER): Payer: 59

## 2019-01-17 ENCOUNTER — Other Ambulatory Visit: Payer: Self-pay

## 2019-01-17 DIAGNOSIS — D519 Vitamin B12 deficiency anemia, unspecified: Secondary | ICD-10-CM

## 2019-01-17 DIAGNOSIS — Z23 Encounter for immunization: Secondary | ICD-10-CM

## 2019-01-17 DIAGNOSIS — M791 Myalgia, unspecified site: Secondary | ICD-10-CM

## 2019-01-17 DIAGNOSIS — R5383 Other fatigue: Secondary | ICD-10-CM

## 2019-01-17 NOTE — Addendum Note (Signed)
Addended by: Lynnea Ferrier on: 01/17/2019 07:28 AM   Modules accepted: Orders

## 2019-01-17 NOTE — Addendum Note (Signed)
Addended by: Lynnea Ferrier on: 01/17/2019 03:19 PM   Modules accepted: Orders

## 2019-01-18 LAB — CBC WITH DIFFERENTIAL/PLATELET
Basophils Absolute: 0 10*3/uL (ref 0.0–0.2)
Basos: 0 %
EOS (ABSOLUTE): 0.2 10*3/uL (ref 0.0–0.4)
Eos: 3 %
Hematocrit: 36.5 % (ref 34.0–46.6)
Hemoglobin: 12 g/dL (ref 11.1–15.9)
Immature Grans (Abs): 0 10*3/uL (ref 0.0–0.1)
Immature Granulocytes: 0 %
Lymphocytes Absolute: 3 10*3/uL (ref 0.7–3.1)
Lymphs: 41 %
MCH: 27.2 pg (ref 26.6–33.0)
MCHC: 32.9 g/dL (ref 31.5–35.7)
MCV: 83 fL (ref 79–97)
Monocytes Absolute: 0.5 10*3/uL (ref 0.1–0.9)
Monocytes: 6 %
Neutrophils Absolute: 3.6 10*3/uL (ref 1.4–7.0)
Neutrophils: 50 %
Platelets: 338 10*3/uL (ref 150–450)
RBC: 4.41 x10E6/uL (ref 3.77–5.28)
RDW: 14.2 % (ref 11.7–15.4)
WBC: 7.3 10*3/uL (ref 3.4–10.8)

## 2019-01-18 LAB — CK: Total CK: 57 U/L (ref 32–182)

## 2019-01-18 LAB — C-REACTIVE PROTEIN: CRP: 13 mg/L — ABNORMAL HIGH (ref 0–10)

## 2019-01-18 LAB — VITAMIN B12: Vitamin B-12: 389 pg/mL (ref 232–1245)

## 2019-01-18 LAB — FERRITIN: Ferritin: 20 ng/mL (ref 15–150)

## 2019-01-18 LAB — VITAMIN D 25 HYDROXY (VIT D DEFICIENCY, FRACTURES): Vit D, 25-Hydroxy: 27.8 ng/mL — ABNORMAL LOW (ref 30.0–100.0)

## 2019-01-18 LAB — SEDIMENTATION RATE: Sed Rate: 41 mm/hr — ABNORMAL HIGH (ref 0–32)

## 2019-01-21 LAB — PATHOLOGIST SMEAR REVIEW
Basophils Absolute: 0 10*3/uL (ref 0.0–0.2)
Basos: 0 %
EOS (ABSOLUTE): 0.2 10*3/uL (ref 0.0–0.4)
Eos: 3 %
Hematocrit: 36.1 % (ref 34.0–46.6)
Hemoglobin: 11.9 g/dL (ref 11.1–15.9)
Immature Grans (Abs): 0 10*3/uL (ref 0.0–0.1)
Immature Granulocytes: 0 %
Lymphocytes Absolute: 2.8 10*3/uL (ref 0.7–3.1)
Lymphs: 40 %
MCH: 27 pg (ref 26.6–33.0)
MCHC: 33 g/dL (ref 31.5–35.7)
MCV: 82 fL (ref 79–97)
Monocytes Absolute: 0.4 10*3/uL (ref 0.1–0.9)
Monocytes: 6 %
Neutrophils Absolute: 3.5 10*3/uL (ref 1.4–7.0)
Neutrophils: 51 %
Path Rev PLTs: NORMAL
Path Rev RBC: NORMAL
Path Rev WBC: NORMAL
Platelets: 333 10*3/uL (ref 150–450)
RBC: 4.4 x10E6/uL (ref 3.77–5.28)
RDW: 14.1 % (ref 11.7–15.4)
WBC: 6.9 10*3/uL (ref 3.4–10.8)

## 2019-01-23 ENCOUNTER — Other Ambulatory Visit: Payer: Self-pay | Admitting: Family Medicine

## 2019-01-23 ENCOUNTER — Encounter: Payer: Self-pay | Admitting: Family Medicine

## 2019-01-23 DIAGNOSIS — M2550 Pain in unspecified joint: Secondary | ICD-10-CM

## 2019-01-23 DIAGNOSIS — G894 Chronic pain syndrome: Secondary | ICD-10-CM

## 2019-01-23 MED ORDER — VITAMIN D (ERGOCALCIFEROL) 1.25 MG (50000 UNIT) PO CAPS: 50000.0000 [IU] | ORAL_CAPSULE | ORAL | 0 refills | Status: DC

## 2019-01-25 ENCOUNTER — Ambulatory Visit (INDEPENDENT_AMBULATORY_CARE_PROVIDER_SITE_OTHER): Payer: 59 | Admitting: Psychology

## 2019-01-25 DIAGNOSIS — F410 Panic disorder [episodic paroxysmal anxiety] without agoraphobia: Secondary | ICD-10-CM

## 2019-01-28 ENCOUNTER — Encounter: Payer: 59 | Admitting: Family Medicine

## 2019-02-01 ENCOUNTER — Other Ambulatory Visit: Payer: Self-pay | Admitting: Family Medicine

## 2019-02-02 ENCOUNTER — Other Ambulatory Visit: Payer: Self-pay | Admitting: Family Medicine

## 2019-02-04 ENCOUNTER — Other Ambulatory Visit: Payer: Self-pay | Admitting: Family

## 2019-02-05 ENCOUNTER — Other Ambulatory Visit: Payer: Self-pay | Admitting: Family Medicine

## 2019-02-07 ENCOUNTER — Encounter: Payer: Self-pay | Admitting: Family Medicine

## 2019-02-07 NOTE — Telephone Encounter (Signed)
Last fill 06/12/18  #30/1 Last OV 01/15/19

## 2019-02-08 ENCOUNTER — Ambulatory Visit (INDEPENDENT_AMBULATORY_CARE_PROVIDER_SITE_OTHER): Payer: 59 | Admitting: Psychology

## 2019-02-08 DIAGNOSIS — F410 Panic disorder [episodic paroxysmal anxiety] without agoraphobia: Secondary | ICD-10-CM | POA: Diagnosis not present

## 2019-02-20 ENCOUNTER — Ambulatory Visit: Payer: 59 | Admitting: Family Medicine

## 2019-02-22 ENCOUNTER — Ambulatory Visit: Payer: 59 | Admitting: Psychology

## 2019-02-25 ENCOUNTER — Encounter: Payer: Self-pay | Admitting: Family Medicine

## 2019-03-02 ENCOUNTER — Encounter: Payer: Self-pay | Admitting: Emergency Medicine

## 2019-03-02 ENCOUNTER — Ambulatory Visit (INDEPENDENT_AMBULATORY_CARE_PROVIDER_SITE_OTHER): Payer: 59

## 2019-03-02 ENCOUNTER — Other Ambulatory Visit: Payer: Self-pay

## 2019-03-02 ENCOUNTER — Ambulatory Visit
Admission: EM | Admit: 2019-03-02 | Discharge: 2019-03-02 | Disposition: A | Discharge status: Home / Self Care | Payer: 59 | Attending: Emergency Medicine | Admitting: Emergency Medicine

## 2019-03-02 DIAGNOSIS — M25562 Pain in left knee: Secondary | ICD-10-CM | POA: Diagnosis not present

## 2019-03-02 DIAGNOSIS — M25462 Effusion, left knee: Secondary | ICD-10-CM

## 2019-03-02 DIAGNOSIS — X503XXA Overexertion from repetitive movements, initial encounter: Secondary | ICD-10-CM

## 2019-03-02 MED ORDER — IBUPROFEN 600 MG PO TABS: 600.0000 mg | ORAL_TABLET | Freq: Four times a day (QID) | ORAL | 0 refills | Status: DC | PRN

## 2019-03-02 MED ORDER — KETOROLAC TROMETHAMINE 60 MG/2ML IM SOLN
30.0000 mg | Freq: Once | INTRAMUSCULAR | Status: AC
  Administered 2019-03-02: 30 mg via INTRAMUSCULAR

## 2019-03-02 MED ORDER — MELOXICAM 15 MG PO TABS: 15.0000 mg | ORAL_TABLET | Freq: Every day | ORAL | 0 refills | Status: AC

## 2019-03-02 MED ORDER — ACETAMINOPHEN 500 MG PO TABS
1000.0000 mg | ORAL_TABLET | Freq: Once | ORAL | Status: AC
  Administered 2019-03-02: 1000 mg via ORAL

## 2019-03-02 NOTE — Discharge Instructions (Signed)
Your x-ray was negative for fracture or dislocation, and the hardware is in place, however you do have fluid in your knee above your kneecap.  Take 600 mg ibuprofen combined with 1 g of Tylenol 3-4 times a day as needed for pain.  Continue the crutches, ice for 20 minutes at a time.  Ace wrap or knee sleeve may help as well.  Go immediately to the ER for fevers above 100.4, if your knee turns red, hot, if you are unable to move your knee at all, or for any other concerns.

## 2019-03-02 NOTE — ED Provider Notes (Signed)
HPI  SUBJECTIVE:  Patricia Bauer is a 48 y.o. female who presents with left knee pain, swelling after going for a hike yesterday.  She does not recall placing any torque on her knee, denies fall or known injury.  She reports dull, achy, constant pain that becomes sharp with movement.  She reports decreased range of motion and difficulty weightbearing due to pain.  No fevers, joint erythema.  She states it felt warm yesterday, but this has resolved.  No body aches, distal numbness or tingling.  No antipyretic in the past 4 to 6 hours.  She tried Tylenol with improvement in her symptoms, elevation, ice, crutches.  Symptoms are worse with movement, weightbearing.  She states that she has had symptoms like this before, before her knee replacement.  Past medical history of left medial partial knee replacement 2015.  No history of diabetes, hypertension, chronic kidney disease, septic joint.  [id number], Marciano Sequin, MD Orthopedics: Dr. Elmyra Ricks.   Past Medical History:  Diagnosis Date  . Anemia   . Anxiety   . Colon polyp   . Depression   . GERD (gastroesophageal reflux disease)   . Headache    migraines   . History of hysterectomy    still has both ovaries  . History of migraine headaches   . PONV (postoperative nausea and vomiting)     Past Surgical History:  Procedure Laterality Date  . ABDOMINAL HYSTERECTOMY    . COLONOSCOPY    . KNEE ARTHROPLASTY    . KNEE ARTHROSCOPY    . knee athroscopy  5/12   LT  . LAPAROSCOPIC VAGINAL HYSTERECTOMY  5/11   Supra cervical  . left partial knee replacement    . PARTIAL KNEE ARTHROPLASTY Left 02/10/2014   Procedure: LEFT MEDIAL UNICOMPARTMENTAL KNEE ARTHROPLASTY;  Surgeon: Gearlean Alf, MD;  Location: [facility];  Service: Orthopedics;  Laterality: Left;  . UPPER GI ENDOSCOPY    . WISDOM TOOTH EXTRACTION  1995    Family History  Problem Relation Age of Onset  . Stroke Father   . Heart attack Father   . Hyperlipidemia Mother   . Hypertension  Mother   . Breast cancer Maternal Aunt   . Colon cancer Neg Hx   . Colon polyps Neg Hx   . Kidney disease Neg Hx   . Gallbladder disease Neg Hx   . Esophageal cancer Neg Hx   . Alcohol abuse Neg Hx   . Drug abuse Neg Hx   . Depression Neg Hx   . Bipolar disorder Neg Hx   . Anxiety disorder Neg Hx   . Schizophrenia Neg Hx   . Suicidality Neg Hx     Social History   Tobacco Use  . Smoking status: Never Smoker  . Smokeless tobacco: Never Used  Substance Use Topics  . Alcohol use: Yes    Alcohol/week: 0.0 standard drinks    Comment: occassional- once a month has about 2-3 drinks  . Drug use: No    No current facility-administered medications for this encounter.   Current Outpatient Medications:  .  ALPRAZolam (XANAX) 0.25 MG tablet, TAKE 1 TABLET (0.25 MG TOTAL) BY MOUTH AT BEDTIME., Disp: 30 tablet, Rfl: 0 .  citalopram (CELEXA) 40 MG tablet, Take 1 tablet (40 mg total) by mouth daily., Disp: 90 tablet, Rfl: 3 .  Ferrous Sulfate 220 (44 Fe) MG/5ML LIQD, 5-56mL 3 times weekly, Disp: 473 mL, Rfl: 5 .  pantoprazole (PROTONIX) 40 MG tablet, TAKE 1 TABLET BY  MOUTH  DAILY, Disp: 90 tablet, Rfl: 2 .  propranolol (INDERAL) 40 MG tablet, TAKE 1/2 TABLET TWICE A DAY (D/C ER/START IR), Disp: 90 tablet, Rfl: 3 .  SUMAtriptan (IMITREX) 50 MG tablet, TAKE 1 TABLET MAY REPEAT IN 2 HOURS IF HEADACHE PERSISTS OR RECURS. DON'T USE MORE THAN TWO IN 24HRS, Disp: 90 tablet, Rfl: 2 .  Vitamin D, Ergocalciferol, (DRISDOL) 1.25 MG (50000 UT) CAPS capsule, Take 1 capsule (50,000 Units total) by mouth every 7 (seven) days., Disp: 12 capsule, Rfl: 0 .  citalopram (CELEXA) 20 MG tablet, TAKE 1 TABLET BY MOUTH  DAILY, Disp: 90 tablet, Rfl: 3 .  ibuprofen (ADVIL) 600 MG tablet, Take 1 tablet (600 mg total) by mouth every 6 (six) hours as needed., Disp: 30 tablet, Rfl: 0  Allergies  Allergen Reactions  . Diflucan [Fluconazole] Other (See Comments)    Pelvic pain  . Onion Other (See Comments)    migraines    . Topamax [Topiramate] Other (See Comments)    Reaction:  Body aches    . Hydrocodone Anxiety, Palpitations and Other (See Comments)    Reaction:  Migraines      ROS  As noted in HPI.   Physical Exam  BP (!) 124/92 (BP Location: Left Arm)   Pulse 80   Temp 97.7 F (36.5 C) (Oral)   Resp 16   Ht 5' 4.5" (1.638 m)   Wt 95.3 kg   LMP 09/07/2009   SpO2 98%   BMI 35.49 kg/m   Constitutional: Well developed, well nourished, no acute distress Eyes:  EOMI, conjunctiva normal bilaterally HENT: Normocephalic, atraumatic,mucus membranes moist Respiratory: Normal inspiratory effort Cardiovascular: Normal rate GI: nondistended skin: No rash, skin intact Musculoskeletal: L Knee ROM slightly decreased due to pain, but able to move the knee, Flexion intact ,  Patella NT, Patellar tendon NT, Medial joint tender, Lateral joint NT, Popliteal region NT, Varus MCL stress testing stable but painful, Valgus LCL stress testing stable, McMurray's testing abnormal, Lachman's negative. Distal NVI with intact baseline sensation / motor distal to knee.  Positive effusion. No erythema. No increased temperature.  Neurologic: Alert & oriented x 3, no focal neuro deficits Psychiatric: Speech and behavior appropriate   ED Course   Medications  acetaminophen (TYLENOL) tablet 1,000 mg (1,000 mg Oral Given 03/02/19 1007)  ketorolac (TORADOL) injection 30 mg (30 mg Intramuscular Given 03/02/19 1006)    Orders Placed This Encounter  Procedures  . DG Knee Complete 4 Views Left    Standing Status:   Standing    Number of Occurrences:   1    Order Specific Question:   Reason for Exam (SYMPTOM  OR DIAGNOSIS REQUIRED)    Answer:   s/p partial knee, medial tenderness post hike.  Evaluate for effusion, loosening of hardware.    No results found for this or any previous visit (from the past 24 hour(s)). Dg Knee Complete 4 Views Left  Result Date: 03/02/2019 CLINICAL DATA:  Medial knee pain and  swelling after hiking. EXAM: LEFT KNEE - COMPLETE 4+ VIEW COMPARISON:  12/02/2013 FINDINGS: There is a moderate suprapatellar joint effusion. Previous left knee, medial compartment hemiarthroplasty. No fracture or dislocation identified. Minimal degenerative changes involving the lateral compartment. IMPRESSION: 1. Moderate suprapatellar joint effusion. 2. Status post left knee medial compartment hemiarthroplasty. Electronically Signed   By: Kerby Moors M.D.   On: 03/02/2019 10:35    ED Clinical Impression  1. Effusion of left knee   2. Overuse injury  ED Assessment/Plan  No evidence of septic joint.  Suspect synovitis or overuse.  Will give Toradol 30 mg IM and Tylenol 1000 mg p.o., check knee x-ray.  Suspect overuse.  Will send home with NSAIDs/Tylenol, rest, ice.  We will have her follow-up with Dr. Maureen Ralphs or [facility] here in [place] if not better in 5 to 7 days for possible drainage of the effusion.  To the ER if she gets worse.  Reviewed imaging independently.  Moderate suprapatellar effusion.  No fracture, dislocation.  Minimal degenerative changes lateral compartment.  See radiology report for full details.  Patient states that she gets lower extremity edema with ibuprofen, migraines with Naprosyn.  She denies anaphylaxis with either 1.  She has agreed to try Mobic.  Discussed imaging, MDM, treatment plan, and plan for follow-up with patient. Discussed sn/sx that should prompt return to the ED. patient agrees with plan.   Meds ordered this encounter  Medications  . acetaminophen (TYLENOL) tablet 1,000 mg  . ketorolac (TORADOL) injection 30 mg  . ibuprofen (ADVIL) 600 MG tablet    Sig: Take 1 tablet (600 mg total) by mouth every 6 (six) hours as needed.    Dispense:  30 tablet    Refill:  0    *This clinic note was created using [employer]. Therefore, there may be occasional mistakes despite careful proofreading.   ?    Melynda Ripple,  MD 03/02/19 1321

## 2019-03-02 NOTE — ED Triage Notes (Signed)
Patient states that she went for a hike yesterday and started having pain and swelling in her left knee.  Patient reports limited ROM.  Patient reports she had a partial knee replacement in her left knee 4 years ago.  Patient denies falling or a specific injury.

## 2019-03-08 ENCOUNTER — Ambulatory Visit (INDEPENDENT_AMBULATORY_CARE_PROVIDER_SITE_OTHER): Payer: 59 | Admitting: Psychology

## 2019-03-08 DIAGNOSIS — F410 Panic disorder [episodic paroxysmal anxiety] without agoraphobia: Secondary | ICD-10-CM | POA: Diagnosis not present

## 2019-03-20 ENCOUNTER — Encounter: Payer: Self-pay | Admitting: Family Medicine

## 2019-03-22 ENCOUNTER — Ambulatory Visit (INDEPENDENT_AMBULATORY_CARE_PROVIDER_SITE_OTHER): Payer: 59 | Admitting: Psychology

## 2019-03-22 DIAGNOSIS — F410 Panic disorder [episodic paroxysmal anxiety] without agoraphobia: Secondary | ICD-10-CM

## 2019-03-23 ENCOUNTER — Other Ambulatory Visit: Payer: Self-pay | Admitting: Family Medicine

## 2019-03-26 ENCOUNTER — Other Ambulatory Visit: Payer: Self-pay

## 2019-03-26 MED ORDER — VITAMIN D (ERGOCALCIFEROL) 1.25 MG (50000 UNIT) PO CAPS: 50000.0000 [IU] | ORAL_CAPSULE | ORAL | 0 refills | Status: DC

## 2019-03-26 NOTE — Telephone Encounter (Signed)
Last OV 01/15/19 Last lab 01/17/19 Last fill   01/23/19  #12/0

## 2019-03-27 DIAGNOSIS — S3992XA Unspecified injury of lower back, initial encounter: Secondary | ICD-10-CM | POA: Insufficient documentation

## 2019-03-27 NOTE — Assessment & Plan Note (Addendum)
Currently taking celexa 40 mg daily with as needed xanax 0.25 mg at bedtime.  PDMP checked- no red flags. Has appointment with Marya Fossa on 04/05/19.  She is pleased with current regimen. No changes made today.

## 2019-03-27 NOTE — Progress Notes (Signed)
Virtual Visit via Video   Due to the COVID-19 pandemic, this visit was completed with telemedicine (audio/video) technology to reduce patient and provider exposure as well as to preserve personal protective equipment.   I connected with Tempie Donning by a video enabled telemedicine application and verified that I am speaking with the correct person using two identifiers. Location patient: Home Location provider: [facility], Office Persons participating in the virtual visit: Jenetta Loges, MD   I discussed the limitations of evaluation and management by telemedicine and the availability of in person appointments. The patient expressed understanding and agreed to proceed.  Care Team   Patient Care Team: Lucille Passy, MD as PCP - General (Family Medicine)  Subjective:   HPI: Patient is needing to discuss medication management along with her back injury.   Anxiety and depression-  Currently taking celexa 40 mg daily with as needed xanax 0.25 mg at bedtime.   PHQ-9 & GAD-7 sent to pt to complete via MyChart and return.  Depression screen [facility] 2/9 03/28/2019 07/24/2018 04/23/2018 09/04/2017 04/24/2017  Decreased Interest 0 0 0 0 1  Down, Depressed, Hopeless 0 0 0 0 1  PHQ - 2 Score 0 0 0 0 2  Altered sleeping 3 - - 2 3  Tired, decreased energy 3 - - 0 3  Change in appetite 0 - - 0 3  Feeling bad or failure about yourself  0 - - 0 1  Trouble concentrating 1 - - 0 2  Moving slowly or fidgety/restless 0 - - 0 2  Suicidal thoughts 0 - - 0 0  PHQ-9 Score 7 - - 2 16  Difficult doing work/chores Somewhat difficult - - Not difficult at all Very difficult   GAD 7 : Generalized Anxiety Score 03/28/2019  Nervous, Anxious, on Edge 1  Control/stop worrying 1  Worry too much - different things 2  Trouble relaxing 1  Restless 0  Easily annoyed or irritable 2  Afraid - awful might happen 0  Total GAD 7 Score 7  Anxiety Difficulty Somewhat difficult       She is  also C/O a back injury.  Patient sent the following message:  "Hello, I hurt my back Sunday 11/29. I've been in severe pain (7/10). Some days I wasn't able to walk or sit.  I have been to the chiropractor twice with a 3rd visit on 12/3.  I am not getting much relief since my back keeps having muscle spasms.  Do I need to make an appointment for an e-visit?  Thank you, Maresha Morrice"   Injured her lower back on 11/29 but bending over and picking up her laptop.  At it's worst, pain is 7/10.  She did go to the chiropractor a few times which didn't help.  She is using ice, heat, and tylenol.  The pain is positional with bending or lifting, without radiation down the legs. There is no numbness in the legs.  Right now pain at it's worst is 3/10.     Review of Systems  Constitutional: Negative for chills and fever.  HENT: Negative for congestion and hearing loss.   Eyes: Negative for discharge and redness.  Respiratory: Negative for cough, shortness of breath and wheezing.   Cardiovascular: Negative for chest pain.  Gastrointestinal: Negative for abdominal pain and heartburn.  Musculoskeletal: Positive for back pain. Negative for falls, joint pain, myalgias and neck pain.  Skin: Negative for rash.  Neurological: Negative for dizziness  and loss of consciousness.  Endo/Heme/Allergies: Does not bruise/bleed easily.  Psychiatric/Behavioral: Negative for depression, hallucinations, memory loss, substance abuse and suicidal ideas. The patient is nervous/anxious. The patient does not have insomnia.   All other systems reviewed and are negative.    Patient Active Problem List   Diagnosis Date Noted  . Back injury 03/27/2019  . Fatigue 01/15/2019  . Myalgia 04/23/2018  . IDA (iron deficiency anemia) 02/16/2018  . Vitamin D deficiency 01/22/2018  . Right foot pain 01/22/2018  . History of partial knee replacement left 07/14/2017  . De Quervain's tenosynovitis, right 03/02/2017  .  Metatarsalgia, right foot 03/02/2017  . Major depressive disorder, recurrent, severe without psychotic features ([place]) 01/15/2015  . GAD (generalized anxiety disorder) 01/15/2015  . Panic disorder without agoraphobia 01/15/2015  . Arthralgia 11/12/2014  . Hemorrhoid prolapse 11/09/2014  . Obesity 09/08/2014  . OA (osteoarthritis) of knee 02/10/2014  . Personal history of colonic polyps 10/16/2012  . History of headache 03/08/2012  . History of hysterectomy, supracervical 10/23/2011  . Anemia 09/08/2011  . GERD (gastroesophageal reflux disease) 09/08/2011  . Anxiety 09/08/2011  . Migraine headache 09/08/2011  . H/O: hysterectomy 09/08/2011    Social History   Tobacco Use  . Smoking status: Never Smoker  . Smokeless tobacco: Never Used  Substance Use Topics  . Alcohol use: Yes    Alcohol/week: 0.0 standard drinks    Comment: occassional- once a month has about 2-3 drinks    Current Outpatient Medications:  .  ALPRAZolam (XANAX) 0.25 MG tablet, Take 1 tablet (0.25 mg total) by mouth at bedtime., Disp: 30 tablet, Rfl: 0 .  citalopram (CELEXA) 40 MG tablet, Take 1 tablet (40 mg total) by mouth daily., Disp: 90 tablet, Rfl: 3 .  Ferrous Sulfate 220 (44 Fe) MG/5ML LIQD, 5-77mL 3 times weekly, Disp: 473 mL, Rfl: 5 .  pantoprazole (PROTONIX) 40 MG tablet, TAKE 1 TABLET BY MOUTH  DAILY, Disp: 90 tablet, Rfl: 3 .  propranolol (INDERAL) 40 MG tablet, TAKE 1/2 TABLET TWICE A DAY (D/C ER/START IR), Disp: 90 tablet, Rfl: 3 .  SUMAtriptan (IMITREX) 50 MG tablet, TAKE 1 TABLET MAY REPEAT IN 2 HOURS IF HEADACHE PERSISTS OR RECURS. DON'T USE MORE THAN TWO IN 24HRS, Disp: 90 tablet, Rfl: 2 .  Vitamin D, Ergocalciferol, (DRISDOL) 1.25 MG (50000 UT) CAPS capsule, Take 1 capsule (50,000 Units total) by mouth every 7 (seven) days., Disp: 12 capsule, Rfl: 0 .  cyclobenzaprine (FLEXERIL) 5 MG tablet, Take 1 tablet (5 mg total) by mouth 3 (three) times daily as needed for muscle spasms., Disp: 90 tablet,  Rfl: 1  Allergies  Allergen Reactions  . Diflucan [Fluconazole] Other (See Comments)    Pelvic pain  . Onion Other (See Comments)    migraines   . Topamax [Topiramate] Other (See Comments)    Reaction:  Body aches    . Hydrocodone Anxiety, Palpitations and Other (See Comments)    Reaction:  Migraines     Objective:  LMP 09/07/2009   VITALS: Per patient if applicable, see vitals. GENERAL: Alert, appears well and in no acute distress. HEENT: Atraumatic, conjunctiva clear, no obvious abnormalities on inspection of external nose and ears. NECK: Normal movements of the head and neck. CARDIOPULMONARY: No increased WOB. Speaking in clear sentences. I:E ratio WNL.  MS: Moves all visible extremities without noticeable abnormality. PSYCH: Pleasant and cooperative, well-groomed. Speech normal rate and rhythm. Affect is appropriate. Insight and judgement are appropriate. Attention is focused, linear, and appropriate.  NEURO: CN grossly intact. Oriented as arrived to appointment on time with no prompting. Moves both UE equally.  SKIN: No obvious lesions, wounds, erythema, or cyanosis noted on face or hands.  Depression screen [facility] 2/9 03/28/2019 07/24/2018 04/23/2018  Decreased Interest 0 0 0  Down, Depressed, Hopeless 0 0 0  PHQ - 2 Score 0 0 0  Altered sleeping 3 - -  Tired, decreased energy 3 - -  Change in appetite 0 - -  Feeling bad or failure about yourself  0 - -  Trouble concentrating 1 - -  Moving slowly or fidgety/restless 0 - -  Suicidal thoughts 0 - -  PHQ-9 Score 7 - -  Difficult doing work/chores Somewhat difficult - -     . COVID-19 Education: The signs and symptoms of COVID-19 were discussed with the patient and how to seek care for testing if needed. The importance of social distancing was discussed today. . Reviewed expectations re: course of current medical issues. . Discussed self-management of symptoms. . Outlined signs and symptoms indicating need for more acute  intervention. . Patient verbalized understanding and all questions were answered. Marland Kitchen Health Maintenance issues including appropriate healthy diet, exercise, and smoking avoidance were discussed with patient. . See orders for this visit as documented in the electronic medical record.   Records requested if needed. Time spent: 25 minutes, of which >50% was spent in obtaining information about her symptoms, reviewing her previous labs, evaluations, and treatments, counseling her about her condition (please see the discussed topics above), and developing a plan to further investigate it; she had a number of questions which I addressed.   Lab Results  Component Value Date   WBC 6.9 01/17/2019   WBC 7.3 01/17/2019   HGB 11.9 01/17/2019   HGB 12.0 01/17/2019   HCT 36.1 01/17/2019   HCT 36.5 01/17/2019   PLT 333 01/17/2019   PLT 338 01/17/2019   GLUCOSE 91 04/23/2018   CHOL 182 11/23/2017   TRIG 162 (A) 11/23/2017   HDL 38 11/23/2017   LDLCALC 112 11/23/2017   ALT 20 04/23/2018   AST 17 04/23/2018   NA 140 04/23/2018   K 4.1 04/23/2018   CL 101 04/23/2018   CREATININE 0.80 04/23/2018   BUN 10 04/23/2018   CO2 21 04/23/2018   TSH 2.840 10/25/2017   INR 1.02 02/07/2014   HGBA1C 6.0 (H) 01/22/2018    Lab Results  Component Value Date   TSH 2.840 10/25/2017   Lab Results  Component Value Date   WBC 6.9 01/17/2019   WBC 7.3 01/17/2019   HGB 11.9 01/17/2019   HGB 12.0 01/17/2019   HCT 36.1 01/17/2019   HCT 36.5 01/17/2019   MCV 82 01/17/2019   MCV 83 01/17/2019   PLT 333 01/17/2019   PLT 338 01/17/2019   Lab Results  Component Value Date   NA 140 04/23/2018   K 4.1 04/23/2018   CO2 21 04/23/2018   GLUCOSE 91 04/23/2018   BUN 10 04/23/2018   CREATININE 0.80 04/23/2018   BILITOT <0.2 04/23/2018   ALKPHOS 86 04/23/2018   AST 17 04/23/2018   ALT 20 04/23/2018   PROT 6.9 04/23/2018   ALBUMIN 4.2 04/23/2018   CALCIUM 9.1 04/23/2018   ANIONGAP 9 02/13/2018   Lab  Results  Component Value Date   CHOL 182 11/23/2017   Lab Results  Component Value Date   HDL 38 11/23/2017   Lab Results  Component Value Date   LDLCALC 112  11/23/2017   Lab Results  Component Value Date   TRIG 162 (A) 11/23/2017   Lab Results  Component Value Date   CHOLHDL 3.7 11/25/2016   Lab Results  Component Value Date   HGBA1C 6.0 (H) 01/22/2018       Assessment & Plan:   Problem List Items Addressed This Visit      Active Problems   Anxiety - Primary    Currently taking celexa 40 mg daily with as needed xanax 0.25 mg at bedtime.  PDMP checked- no red flags. Has appointment with Marya Fossa on 04/05/19.  She is pleased with current regimen. No changes made today.      Relevant Medications   citalopram (CELEXA) 40 MG tablet   ALPRAZolam (XANAX) 0.25 MG tablet   Major depressive disorder, recurrent, severe without psychotic features (H[facility])   Relevant Medications   citalopram (CELEXA) 40 MG tablet   ALPRAZolam (XANAX) 0.25 MG tablet   Back injury     ASSESSMENT:  lumbar strain  PLAN: For acute pain, rest, intermittent application of cold packs (later, may switch to heat, but do not sleep on heating pad), analgesics and muscle relaxants are recommended (eRx sent for flexeril to pharmacy on file- discussed sedation precautions). Discussed longer term treatment plan of prn NSAID's and discussed a home back care exercise program with flexion exercise routine. Proper lifting with avoidance of heavy lifting discussed. Consider Physical Therapy and XRay studies if not improving. Call or return to clinic prn if these symptoms worsen or fail to improve as anticipated.          I am having Mikaia Almaraz. [place] start on cyclobenzaprine. I am also having her maintain her SUMAtriptan, Ferrous Sulfate, propranolol, pantoprazole, Vitamin D (Ergocalciferol), citalopram, and ALPRAZolam.  Meds ordered this encounter  Medications  . cyclobenzaprine (FLEXERIL) 5 MG tablet     Sig: Take 1 tablet (5 mg total) by mouth 3 (three) times daily as needed for muscle spasms.    Dispense:  90 tablet    Refill:  1  . citalopram (CELEXA) 40 MG tablet    Sig: Take 1 tablet (40 mg total) by mouth daily.    Dispense:  90 tablet    Refill:  3  . ALPRAZolam (XANAX) 0.25 MG tablet    Sig: Take 1 tablet (0.25 mg total) by mouth at bedtime.    Dispense:  30 tablet    Refill:  0    This request is for a new prescription for a controlled substance as required by Federal/State law.     Arnette Norris, MD

## 2019-03-27 NOTE — Assessment & Plan Note (Addendum)
  ASSESSMENT:  lumbar strain  PLAN: For acute pain, rest, intermittent application of cold packs (later, may switch to heat, but do not sleep on heating pad), analgesics and muscle relaxants are recommended (eRx sent for flexeril to pharmacy on file- discussed sedation precautions). Discussed longer term treatment plan of prn NSAID's and discussed a home back care exercise program with flexion exercise routine. Proper lifting with avoidance of heavy lifting discussed. Consider Physical Therapy and XRay studies if not improving. Call or return to clinic prn if these symptoms worsen or fail to improve as anticipated.

## 2019-03-28 ENCOUNTER — Ambulatory Visit (INDEPENDENT_AMBULATORY_CARE_PROVIDER_SITE_OTHER): Payer: 59 | Admitting: Family Medicine

## 2019-03-28 ENCOUNTER — Encounter: Payer: Self-pay | Admitting: Family Medicine

## 2019-03-28 ENCOUNTER — Other Ambulatory Visit: Payer: Self-pay

## 2019-03-28 DIAGNOSIS — S3992XA Unspecified injury of lower back, initial encounter: Secondary | ICD-10-CM

## 2019-03-28 DIAGNOSIS — F332 Major depressive disorder, recurrent severe without psychotic features: Secondary | ICD-10-CM

## 2019-03-28 DIAGNOSIS — F419 Anxiety disorder, unspecified: Secondary | ICD-10-CM

## 2019-03-28 MED ORDER — CYCLOBENZAPRINE HCL 5 MG PO TABS: 5.0000 mg | ORAL_TABLET | Freq: Three times a day (TID) | ORAL | 1 refills | Status: DC | PRN

## 2019-03-28 MED ORDER — CITALOPRAM HYDROBROMIDE 40 MG PO TABS: 40.0000 mg | ORAL_TABLET | Freq: Every day | ORAL | 3 refills | Status: DC

## 2019-03-28 MED ORDER — ALPRAZOLAM 0.25 MG PO TABS: 0.2500 mg | ORAL_TABLET | Freq: Every day | ORAL | 0 refills | Status: DC

## 2019-03-28 NOTE — Patient Instructions (Signed)
There are no preventive care reminders to display for this patient.  Depression screen [facility] 2/9 07/24/2018 04/23/2018 09/04/2017  Decreased Interest 0 0 0  Down, Depressed, Hopeless 0 0 0  PHQ - 2 Score 0 0 0  Altered sleeping - - 2  Tired, decreased energy - - 0  Change in appetite - - 0  Feeling bad or failure about yourself  - - 0  Trouble concentrating - - 0  Moving slowly or fidgety/restless - - 0  Suicidal thoughts - - 0  PHQ-9 Score - - 2  Difficult doing work/chores - - Not difficult at all

## 2019-04-05 ENCOUNTER — Ambulatory Visit: Payer: 59 | Admitting: Psychology

## 2019-04-26 ENCOUNTER — Encounter: Payer: Self-pay | Admitting: Family Medicine

## 2019-05-01 ENCOUNTER — Other Ambulatory Visit: Payer: Self-pay

## 2019-05-01 MED ORDER — TRAMADOL HCL 50 MG PO TABS: ORAL_TABLET | ORAL | 5 refills | Status: DC

## 2019-05-01 NOTE — Telephone Encounter (Signed)
TA-I received a refill req for the Tramadol/last filled on 5.6.2020/she was tried on Lyrica and was taken off of that and now she is needing the Tramadol refilled/Per [place] PMP pt is compliant without red flags/Rx prepared and pended per your approval/thx dmf

## 2019-05-03 ENCOUNTER — Ambulatory Visit: Payer: 59 | Admitting: Psychology

## 2019-05-10 ENCOUNTER — Ambulatory Visit (INDEPENDENT_AMBULATORY_CARE_PROVIDER_SITE_OTHER): Payer: 59 | Admitting: Psychology

## 2019-05-10 ENCOUNTER — Encounter: Payer: Self-pay | Admitting: Family Medicine

## 2019-05-10 DIAGNOSIS — F410 Panic disorder [episodic paroxysmal anxiety] without agoraphobia: Secondary | ICD-10-CM

## 2019-05-13 ENCOUNTER — Other Ambulatory Visit: Payer: Self-pay | Admitting: Family Medicine

## 2019-05-13 DIAGNOSIS — E611 Iron deficiency: Secondary | ICD-10-CM

## 2019-05-15 NOTE — Telephone Encounter (Signed)
Please see message and advise.  Thank you. ° °

## 2019-05-17 ENCOUNTER — Ambulatory Visit (INDEPENDENT_AMBULATORY_CARE_PROVIDER_SITE_OTHER): Payer: 59 | Admitting: Psychology

## 2019-05-17 DIAGNOSIS — F410 Panic disorder [episodic paroxysmal anxiety] without agoraphobia: Secondary | ICD-10-CM

## 2019-05-24 ENCOUNTER — Other Ambulatory Visit: Payer: Self-pay

## 2019-05-24 ENCOUNTER — Inpatient Hospital Stay: Payer: 59

## 2019-05-24 ENCOUNTER — Inpatient Hospital Stay: Payer: 59 | Attending: Family | Admitting: Family

## 2019-05-24 ENCOUNTER — Other Ambulatory Visit: Payer: Self-pay | Admitting: *Deleted

## 2019-05-24 VITALS — BP 136/79 | HR 68 | Temp 97.7°F | Resp 17 | Wt 229.1 lb

## 2019-05-24 DIAGNOSIS — R0602 Shortness of breath: Secondary | ICD-10-CM | POA: Diagnosis not present

## 2019-05-24 DIAGNOSIS — K219 Gastro-esophageal reflux disease without esophagitis: Secondary | ICD-10-CM | POA: Diagnosis not present

## 2019-05-24 DIAGNOSIS — D519 Vitamin B12 deficiency anemia, unspecified: Secondary | ICD-10-CM

## 2019-05-24 DIAGNOSIS — K909 Intestinal malabsorption, unspecified: Secondary | ICD-10-CM | POA: Insufficient documentation

## 2019-05-24 DIAGNOSIS — Z79899 Other long term (current) drug therapy: Secondary | ICD-10-CM | POA: Insufficient documentation

## 2019-05-24 DIAGNOSIS — D508 Other iron deficiency anemias: Secondary | ICD-10-CM | POA: Insufficient documentation

## 2019-05-24 LAB — CBC WITH DIFFERENTIAL (CANCER CENTER ONLY)
Abs Immature Granulocytes: 0.02 10*3/uL (ref 0.00–0.07)
Basophils Absolute: 0 10*3/uL (ref 0.0–0.1)
Basophils Relative: 0 %
Eosinophils Absolute: 0.1 10*3/uL (ref 0.0–0.5)
Eosinophils Relative: 1 %
HCT: 42.1 % (ref 36.0–46.0)
Hemoglobin: 13.6 g/dL (ref 12.0–15.0)
Immature Granulocytes: 0 %
Lymphocytes Relative: 28 %
Lymphs Abs: 2.6 10*3/uL (ref 0.7–4.0)
MCH: 28.5 pg (ref 26.0–34.0)
MCHC: 32.3 g/dL (ref 30.0–36.0)
MCV: 88.3 fL (ref 80.0–100.0)
Monocytes Absolute: 0.5 10*3/uL (ref 0.1–1.0)
Monocytes Relative: 6 %
Neutro Abs: 6 10*3/uL (ref 1.7–7.7)
Neutrophils Relative %: 65 %
Platelet Count: 371 10*3/uL (ref 150–400)
RBC: 4.77 MIL/uL (ref 3.87–5.11)
RDW: 13.2 % (ref 11.5–15.5)
WBC Count: 9.3 10*3/uL (ref 4.0–10.5)
nRBC: 0 % (ref 0.0–0.2)

## 2019-05-24 LAB — CMP (CANCER CENTER ONLY)
ALT: 32 U/L (ref 0–44)
AST: 27 U/L (ref 15–41)
Albumin: 4.3 g/dL (ref 3.5–5.0)
Alkaline Phosphatase: 71 U/L (ref 38–126)
Anion gap: 9 (ref 5–15)
BUN: 14 mg/dL (ref 6–20)
CO2: 30 mmol/L (ref 22–32)
Calcium: 9.5 mg/dL (ref 8.9–10.3)
Chloride: 100 mmol/L (ref 98–111)
Creatinine: 1.05 mg/dL — ABNORMAL HIGH (ref 0.44–1.00)
GFR, Est AFR Am: >60 mL/min (ref >60)
GFR, Estimated: >60 mL/min (ref >60)
Glucose, Bld: 101 mg/dL — ABNORMAL HIGH (ref 70–99)
Potassium: 3.9 mmol/L (ref 3.5–5.1)
Sodium: 139 mmol/L (ref 135–145)
Total Bilirubin: 0.3 mg/dL (ref 0.3–1.2)
Total Protein: 7.4 g/dL (ref 6.5–8.1)

## 2019-05-24 NOTE — Progress Notes (Signed)
Hematology and Oncology Follow Up Visit  Patricia Bauer [id number] [birth year] 49 y.o. 05/24/2019   Principle Diagnosis:  Iron deficiency anemia secondary to malabsorption  Current Therapy:   Observation, patient refused IV iron and does not tolerate oral iron   Interim History:  Patricia Bauer is here today for follow-up. We last saw her in October 2019 but she was unable to do IV iron due to the cost. She has tried to take oral iron but this causes nausea.  She is symptomatic with fatigue and dizziness when standing too quickly.  She also notes mild SOB with exertion such as stairs.  She denies any episodes of bleeding. No bruising or petechiae.  She has GERD and takes Protonix daily as well as Pepcid for breakthrough episodes.  No fever, chills, vomiting, cough, rash, dizziness, chest pain, palpitations, abdominal pain or changes in bowel or bladder habits.  No swelling, tenderness, numbness or tingling in her extremities.  No falls or syncope.  She states that her appetite is down and that she needs to better hydrate throughout the day. She is eating some pork and has eaten some gluten. Her weight is stable.  ECOG Performance Status: 1 - Symptomatic but completely ambulatory  Medications:  Allergies as of 05/24/2019      Reactions   Diflucan [fluconazole] Other (See Comments)   Pelvic pain   Onion Other (See Comments)   migraines    Topamax [topiramate] Other (See Comments)   Reaction:  Body aches    Hydrocodone Anxiety, Palpitations, Other (See Comments)   Reaction:  Migraines       Medication List       Accurate as of May 24, 2019  2:12 PM. If you have any questions, ask your nurse or doctor.        ALPRAZolam 0.25 MG tablet Commonly known as: XANAX Take 1 tablet (0.25 mg total) by mouth at bedtime.   citalopram 40 MG tablet Commonly known as: CELEXA Take 1 tablet (40 mg total) by mouth daily.   cyclobenzaprine 5 MG tablet Commonly known as: FLEXERIL Take 1  tablet (5 mg total) by mouth 3 (three) times daily as needed for muscle spasms.   Ferrous Sulfate 220 (44 Fe) MG/5ML Liqd 5-51mL 3 times weekly   pantoprazole 40 MG tablet Commonly known as: PROTONIX TAKE 1 TABLET BY MOUTH  DAILY   propranolol 40 MG tablet Commonly known as: INDERAL TAKE 1/2 TABLET TWICE A DAY (D/C ER/START IR)   SUMAtriptan 50 MG tablet Commonly known as: IMITREX TAKE 1 TABLET MAY REPEAT IN 2 HOURS IF HEADACHE PERSISTS OR RECURS. DON'T USE MORE THAN TWO IN 24HRS   traMADol 50 MG tablet Commonly known as: ULTRAM Take 1-2q6h prn   Vitamin D (Ergocalciferol) 1.25 MG (50000 UNIT) Caps capsule Commonly known as: DRISDOL Take 1 capsule (50,000 Units total) by mouth every 7 (seven) days.       Allergies:  Allergies  Allergen Reactions  . Diflucan [Fluconazole] Other (See Comments)    Pelvic pain  . Onion Other (See Comments)    migraines   . Topamax [Topiramate] Other (See Comments)    Reaction:  Body aches    . Hydrocodone Anxiety, Palpitations and Other (See Comments)    Reaction:  Migraines     Past Medical History, Surgical history, Social history, and Family History were reviewed and updated.  Review of Systems: All other 10 point review of systems is negative.   Physical Exam:  vitals were not  taken for this visit.   Wt Readings from Last 3 Encounters:  03/02/19 210 lb (95.3 kg)  10/20/18 211 lb (95.7 kg)  06/21/18 214 lb 9.6 oz (97.3 kg)    Ocular: Sclerae unicteric, pupils equal, round and reactive to light Ear-nose-throat: Oropharynx clear, dentition fair Lymphatic: No cervical or supraclavicular adenopathy Lungs no rales or rhonchi, good excursion bilaterally Heart regular rate and rhythm, no murmur appreciated Abd soft, nontender, positive bowel sounds, no liver or spleen tip palpated on exam, no fluid wave  MSK no focal spinal tenderness, no joint edema Neuro: non-focal, well-oriented, appropriate affect Breasts: Deferred   Lab  Results  Component Value Date   WBC 9.3 05/24/2019   HGB 13.6 05/24/2019   HCT 42.1 05/24/2019   MCV 88.3 05/24/2019   PLT 371 05/24/2019   Lab Results  Component Value Date   FERRITIN 20 01/17/2019   IRON 34 06/21/2018   TIBC 399 06/21/2018   UIBC 365 06/21/2018   IRONPCTSAT 9 (LL) 06/21/2018   Lab Results  Component Value Date   RETICCTPCT 1.6 02/13/2018   RBC 4.77 05/24/2019   No results found for: KPAFRELGTCHN, LAMBDASER, KAPLAMBRATIO No results found for: IGGSERUM, IGA, IGMSERUM No results found for: Kathrynn Ducking, MSPIKE, SPEI   Chemistry      Component Value Date/Time   NA 140 04/23/2018 1440   NA 139 03/13/2013 2022   K 4.1 04/23/2018 1440   K 3.6 03/13/2013 2022   CL 101 04/23/2018 1440   CL 106 03/13/2013 2022   CO2 21 04/23/2018 1440   CO2 26 03/13/2013 2022   BUN 10 04/23/2018 1440   BUN 11 03/13/2013 2022   CREATININE 0.80 04/23/2018 1440   CREATININE 0.91 02/13/2018 1459   CREATININE 0.80 03/13/2013 2022   GLU 94 11/23/2017 0000      Component Value Date/Time   CALCIUM 9.1 04/23/2018 1440   CALCIUM 8.7 03/13/2013 2022   ALKPHOS 86 04/23/2018 1440   AST 17 04/23/2018 1440   AST 24 02/13/2018 1459   ALT 20 04/23/2018 1440   ALT 25 02/13/2018 1459   BILITOT <0.2 04/23/2018 1440   BILITOT <0.2 (L) 02/13/2018 1459       Impression and Plan: Patricia Bauer is a very pleasant 49 yo caucasian female with iron deficiency anemia secondary to malabsorption.  We will see what her iron studies show and bring her back in for infusion if needed.  We will go ahead and plan to see her back in 2 months for follow-up and repeat lab work.  She can contact our office with any questions or concerns. We can certainly see her sooner if needed.   Laverna Peace, NP 2/5/20212:12 PM

## 2019-05-27 ENCOUNTER — Telehealth: Payer: Self-pay | Admitting: Family

## 2019-05-27 ENCOUNTER — Encounter: Payer: Self-pay | Admitting: Family Medicine

## 2019-05-27 LAB — IRON AND TIBC
Iron: 81 ug/dL (ref 41–142)
Saturation Ratios: 21 % (ref 21–57)
TIBC: 381 ug/dL (ref 236–444)
UIBC: 300 ug/dL (ref 120–384)

## 2019-05-27 LAB — FERRITIN: Ferritin: 23 ng/mL (ref 11–307)

## 2019-05-27 NOTE — Telephone Encounter (Signed)
Called and spoke with patient regarding appointments added per 2/5 los

## 2019-05-29 ENCOUNTER — Encounter: Payer: Self-pay | Admitting: Family Medicine

## 2019-05-31 ENCOUNTER — Ambulatory Visit (INDEPENDENT_AMBULATORY_CARE_PROVIDER_SITE_OTHER): Payer: 59 | Admitting: Psychology

## 2019-05-31 DIAGNOSIS — F410 Panic disorder [episodic paroxysmal anxiety] without agoraphobia: Secondary | ICD-10-CM

## 2019-06-04 ENCOUNTER — Telehealth: Payer: Self-pay

## 2019-06-04 NOTE — Telephone Encounter (Signed)
Requesting: Alprazolam Contract: None UDS: None Last OV: 12.10.2020 Next OV: Only scheduled with [facility] for therapy Last Refill: 12.10.2020   Please advise/former TA pt/thx dmf

## 2019-06-05 MED ORDER — ALPRAZOLAM 0.25 MG PO TABS: 0.2500 mg | ORAL_TABLET | Freq: Every day | ORAL | 0 refills | Status: DC

## 2019-06-05 NOTE — Telephone Encounter (Signed)
I called and left information on patient voicemail. RX was only written for 30 days and will need to establish with a new PCP due to Dr. Deborra Medina no longer at this office.

## 2019-06-05 NOTE — Telephone Encounter (Signed)
I refilled #30 but pt will need OV in the next mo (prior to another refill) for UDS, controlled sub agreement, and to establish with new pcp. Thanks

## 2019-06-14 ENCOUNTER — Ambulatory Visit: Payer: 59 | Admitting: Psychology

## 2019-06-28 ENCOUNTER — Ambulatory Visit: Payer: 59 | Admitting: Psychology

## 2019-07-01 ENCOUNTER — Encounter: Payer: Self-pay | Admitting: Family

## 2019-07-12 ENCOUNTER — Ambulatory Visit: Payer: 59 | Admitting: Psychology

## 2019-07-12 ENCOUNTER — Ambulatory Visit (INDEPENDENT_AMBULATORY_CARE_PROVIDER_SITE_OTHER): Payer: 59 | Admitting: Psychology

## 2019-07-12 DIAGNOSIS — F410 Panic disorder [episodic paroxysmal anxiety] without agoraphobia: Secondary | ICD-10-CM | POA: Diagnosis not present

## 2019-07-19 ENCOUNTER — Ambulatory Visit: Payer: 59 | Attending: Internal Medicine

## 2019-07-19 DIAGNOSIS — Z23 Encounter for immunization: Secondary | ICD-10-CM

## 2019-07-19 NOTE — Progress Notes (Signed)
   Covid-19 Vaccination Clinic  Name:  Patricia Bauer    MRN: [id number] DOB: 1971-01-18  07/19/2019  Patricia Bauer was observed post Covid-19 immunization for 15 minutes without incident. She was provided with Vaccine Information Sheet and instruction to access the V-Safe system.   Patricia Bauer was instructed to call 911 with any severe reactions post vaccine: Marland Kitchen Difficulty breathing  . Swelling of face and throat  . A fast heartbeat  . A bad rash all over body  . Dizziness and weakness   Immunizations Administered    Name Date Dose VIS Date Route   Pfizer COVID-19 Vaccine 07/19/2019 11:43 AM 0.3 mL 03/29/2019 Intramuscular   Manufacturer: [place]   Lot: [id number]   [place]: [id number]

## 2019-07-22 ENCOUNTER — Other Ambulatory Visit: Payer: Self-pay

## 2019-07-22 ENCOUNTER — Inpatient Hospital Stay (HOSPITAL_BASED_OUTPATIENT_CLINIC_OR_DEPARTMENT_OTHER): Payer: 59 | Admitting: Family

## 2019-07-22 ENCOUNTER — Encounter: Payer: Self-pay | Admitting: Family

## 2019-07-22 ENCOUNTER — Inpatient Hospital Stay: Payer: 59 | Attending: Family

## 2019-07-22 VITALS — BP 151/94 | HR 66 | Temp 97.1°F | Resp 18 | Ht 65.0 in | Wt 238.0 lb

## 2019-07-22 DIAGNOSIS — Z90710 Acquired absence of both cervix and uterus: Secondary | ICD-10-CM | POA: Insufficient documentation

## 2019-07-22 DIAGNOSIS — D508 Other iron deficiency anemias: Secondary | ICD-10-CM | POA: Insufficient documentation

## 2019-07-22 DIAGNOSIS — K909 Intestinal malabsorption, unspecified: Secondary | ICD-10-CM | POA: Insufficient documentation

## 2019-07-22 DIAGNOSIS — Z79899 Other long term (current) drug therapy: Secondary | ICD-10-CM | POA: Insufficient documentation

## 2019-07-22 LAB — CBC WITH DIFFERENTIAL (CANCER CENTER ONLY)
Abs Immature Granulocytes: 0.03 10*3/uL (ref 0.00–0.07)
Basophils Absolute: 0 10*3/uL (ref 0.0–0.1)
Basophils Relative: 0 %
Eosinophils Absolute: 0.2 10*3/uL (ref 0.0–0.5)
Eosinophils Relative: 2 %
HCT: 38.9 % (ref 36.0–46.0)
Hemoglobin: 12.5 g/dL (ref 12.0–15.0)
Immature Granulocytes: 0 %
Lymphocytes Relative: 33 %
Lymphs Abs: 2.3 10*3/uL (ref 0.7–4.0)
MCH: 28.9 pg (ref 26.0–34.0)
MCHC: 32.1 g/dL (ref 30.0–36.0)
MCV: 89.8 fL (ref 80.0–100.0)
Monocytes Absolute: 0.3 10*3/uL (ref 0.1–1.0)
Monocytes Relative: 5 %
Neutro Abs: 4.1 10*3/uL (ref 1.7–7.7)
Neutrophils Relative %: 60 %
Platelet Count: 285 10*3/uL (ref 150–400)
RBC: 4.33 MIL/uL (ref 3.87–5.11)
RDW: 12.9 % (ref 11.5–15.5)
WBC Count: 7 10*3/uL (ref 4.0–10.5)
nRBC: 0 % (ref 0.0–0.2)

## 2019-07-22 NOTE — Progress Notes (Signed)
Hematology and Oncology Follow Up Visit  SCOTT MALLICOAT [id number] 06/02/1970 49 y.o. 07/22/2019   Principle Diagnosis:  Iron deficiency anemia secondary to malabsorption  Current Therapy:        Observation, patient refused IV iron and does not tolerate oral iron   Interim History:  Ms. Schindler is here today for follow-up. She is symptomatic with fatigue, chewing ice, SOB with over exertion and increased joint pain.  Her PCP was working her up for arthritis but has since left so she is still trying to decide who to switch to in that same practice.  Hgb is stable at 12.5, MCV 89. She has not noted any episodes of bleeding. No bruising or petechiae.  No cycle. She has had a partial hysterectomy.  She has palpitations with panic attacks and takes a Xanax when needed.  No fever, chills, n/v, cough, rash, chest pain, abdominal pain or changes in bowel or bladder habits.  She has occasional swelling over her joints.  She has minimal numbness and tingling in her fingertips off and on.  She has maintained a good appetite and is staying well hydrated.  ECOG Performance Status: 1 - Symptomatic but completely ambulatory  Medications:  Allergies as of 07/22/2019      Reactions   Diflucan [fluconazole] Other (See Comments)   Pelvic pain   Onion Other (See Comments)   migraines    Topamax [topiramate] Other (See Comments)   Reaction:  Body aches    Hydrocodone Anxiety, Palpitations, Other (See Comments)   Reaction:  Migraines       Medication List       Accurate as of July 22, 2019 11:53 AM. If you have any questions, ask your nurse or doctor.        ALPRAZolam 0.25 MG tablet Commonly known as: XANAX Take 1 tablet (0.25 mg total) by mouth at bedtime.   citalopram 40 MG tablet Commonly known as: CELEXA Take 1 tablet (40 mg total) by mouth daily.   cyclobenzaprine 5 MG tablet Commonly known as: FLEXERIL Take 1 tablet (5 mg total) by mouth 3 (three) times daily as needed for  muscle spasms.   Ferrous Sulfate 220 (44 Fe) MG/5ML Liqd 5-44mL 3 times weekly   pantoprazole 40 MG tablet Commonly known as: PROTONIX TAKE 1 TABLET BY MOUTH  DAILY   propranolol 40 MG tablet Commonly known as: INDERAL TAKE 1/2 TABLET TWICE A DAY (D/C ER/START IR)   SUMAtriptan 50 MG tablet Commonly known as: IMITREX TAKE 1 TABLET MAY REPEAT IN 2 HOURS IF HEADACHE PERSISTS OR RECURS. DON'T USE MORE THAN TWO IN 24HRS   traMADol 50 MG tablet Commonly known as: ULTRAM Take 1-2q6h prn   Vitamin D (Ergocalciferol) 1.25 MG (50000 UNIT) Caps capsule Commonly known as: DRISDOL Take 1 capsule (50,000 Units total) by mouth every 7 (seven) days.       Allergies:  Allergies  Allergen Reactions  . Diflucan [Fluconazole] Other (See Comments)    Pelvic pain  . Onion Other (See Comments)    migraines   . Topamax [Topiramate] Other (See Comments)    Reaction:  Body aches    . Hydrocodone Anxiety, Palpitations and Other (See Comments)    Reaction:  Migraines     Past Medical History, Surgical history, Social history, and Family History were reviewed and updated.  Review of Systems: All other 10 point review of systems is negative.   Physical Exam:  vitals were not taken for this visit.  Wt Readings from Last 3 Encounters:  05/24/19 229 lb 1.9 oz (103.9 kg)  03/02/19 210 lb (95.3 kg)  10/20/18 211 lb (95.7 kg)    Ocular: Sclerae unicteric, pupils equal, round and reactive to light Ear-nose-throat: Oropharynx clear, dentition fair Lymphatic: No cervical or supraclavicular adenopathy Lungs no rales or rhonchi, good excursion bilaterally Heart regular rate and rhythm, no murmur appreciated Abd soft, nontender, positive bowel sounds, no liver or spleen tip palpated on exam, no fluid wave  MSK no focal spinal tenderness, no joint edema Neuro: non-focal, well-oriented, appropriate affect Breasts: Deferred   Lab Results  Component Value Date   WBC 7.0 07/22/2019   HGB 12.5  07/22/2019   HCT 38.9 07/22/2019   MCV 89.8 07/22/2019   PLT 285 07/22/2019   Lab Results  Component Value Date   FERRITIN 23 05/24/2019   IRON 81 05/24/2019   TIBC 381 05/24/2019   UIBC 300 05/24/2019   IRONPCTSAT 21 05/24/2019   Lab Results  Component Value Date   RETICCTPCT 1.6 02/13/2018   RBC 4.33 07/22/2019   No results found for: KPAFRELGTCHN, LAMBDASER, KAPLAMBRATIO No results found for: IGGSERUM, IGA, IGMSERUM No results found for: [name], SPEI   Chemistry      Component Value Date/Time   NA 139 05/24/2019 1359   NA 140 04/23/2018 1440   NA 139 03/13/2013 2022   K 3.9 05/24/2019 1359   K 3.6 03/13/2013 2022   CL 100 05/24/2019 1359   CL 106 03/13/2013 2022   CO2 30 05/24/2019 1359   CO2 26 03/13/2013 2022   BUN 14 05/24/2019 1359   BUN 10 04/23/2018 1440   BUN 11 03/13/2013 2022   CREATININE 1.05 (H) 05/24/2019 1359   CREATININE 0.80 03/13/2013 2022   GLU 94 11/23/2017 0000      Component Value Date/Time   CALCIUM 9.5 05/24/2019 1359   CALCIUM 8.7 03/13/2013 2022   ALKPHOS 71 05/24/2019 1359   AST 27 05/24/2019 1359   ALT 32 05/24/2019 1359   BILITOT 0.3 05/24/2019 1359       Impression and Plan: Ms. Perrotti is a very pleasant 49 yo caucasian female with iron deficiency anemia secondary to malabsorption. We will see what her iron studies look like and replace if needed IV. She did not tolerate oral iron. If she does require IV iron we will have Baxter Flattery our financial counselor meet with her to discuss possible assistance.  We will plan to see her again in another 3 months.  She will contact our office with any questions or concerns. We can certainly see her sooner if needed.   Laverna Peace, NP 4/5/202111:53 AM

## 2019-07-23 ENCOUNTER — Telehealth: Payer: Self-pay | Admitting: Family

## 2019-07-23 ENCOUNTER — Other Ambulatory Visit: Payer: Self-pay | Admitting: Family

## 2019-07-23 LAB — FERRITIN: Ferritin: 17 ng/mL (ref 11–307)

## 2019-07-23 LAB — IRON AND TIBC
Iron: 46 ug/dL (ref 41–142)
Saturation Ratios: 12 % — ABNORMAL LOW (ref 21–57)
TIBC: 385 ug/dL (ref 236–444)
UIBC: 339 ug/dL (ref 120–384)

## 2019-07-23 NOTE — Telephone Encounter (Signed)
Called and spoke with patient regarding appointments added per 4/6 sch msg

## 2019-07-26 ENCOUNTER — Inpatient Hospital Stay: Payer: 59

## 2019-07-26 ENCOUNTER — Ambulatory Visit: Payer: 59 | Admitting: Psychology

## 2019-07-26 ENCOUNTER — Other Ambulatory Visit: Payer: Self-pay

## 2019-07-26 VITALS — BP 147/75 | HR 64 | Temp 96.9°F | Resp 18

## 2019-07-26 DIAGNOSIS — D508 Other iron deficiency anemias: Secondary | ICD-10-CM | POA: Diagnosis not present

## 2019-07-26 MED ORDER — SODIUM CHLORIDE 0.9 % IV SOLN
Freq: Once | INTRAVENOUS | Status: AC
  Filled 2019-07-26: qty 250

## 2019-07-26 MED ORDER — SODIUM CHLORIDE 0.9 % IV SOLN
200.0000 mg | Freq: Once | INTRAVENOUS | Status: AC
  Administered 2019-07-26: 200 mg via INTRAVENOUS
  Filled 2019-07-26: qty 200

## 2019-07-26 NOTE — Patient Instructions (Signed)

## 2019-08-02 ENCOUNTER — Other Ambulatory Visit: Payer: Self-pay

## 2019-08-02 ENCOUNTER — Inpatient Hospital Stay: Payer: 59

## 2019-08-02 VITALS — BP 139/90 | HR 87 | Temp 97.7°F | Resp 18

## 2019-08-02 DIAGNOSIS — D508 Other iron deficiency anemias: Secondary | ICD-10-CM

## 2019-08-02 MED ORDER — SODIUM CHLORIDE 0.9 % IV SOLN
200.0000 mg | Freq: Once | INTRAVENOUS | Status: AC
  Administered 2019-08-02: 200 mg via INTRAVENOUS
  Filled 2019-08-02: qty 200

## 2019-08-02 MED ORDER — SODIUM CHLORIDE 0.9 % IV SOLN
Freq: Once | INTRAVENOUS | Status: AC
  Filled 2019-08-02: qty 250

## 2019-08-06 ENCOUNTER — Inpatient Hospital Stay: Payer: 59

## 2019-08-06 ENCOUNTER — Other Ambulatory Visit: Payer: Self-pay

## 2019-08-06 VITALS — BP 130/85 | HR 66 | Temp 99.5°F | Resp 17

## 2019-08-06 DIAGNOSIS — D508 Other iron deficiency anemias: Secondary | ICD-10-CM

## 2019-08-06 MED ORDER — SODIUM CHLORIDE 0.9 % IV SOLN
Freq: Once | INTRAVENOUS | Status: AC
  Filled 2019-08-06: qty 250

## 2019-08-06 MED ORDER — SODIUM CHLORIDE 0.9 % IV SOLN
200.0000 mg | Freq: Once | INTRAVENOUS | Status: AC
  Administered 2019-08-06: 14:00:00 200 mg via INTRAVENOUS
  Filled 2019-08-06: qty 10

## 2019-08-06 NOTE — Patient Instructions (Signed)

## 2019-08-09 ENCOUNTER — Inpatient Hospital Stay: Payer: 59

## 2019-08-09 ENCOUNTER — Ambulatory Visit (INDEPENDENT_AMBULATORY_CARE_PROVIDER_SITE_OTHER): Payer: 59 | Admitting: Psychology

## 2019-08-09 DIAGNOSIS — F410 Panic disorder [episodic paroxysmal anxiety] without agoraphobia: Secondary | ICD-10-CM

## 2019-08-12 ENCOUNTER — Ambulatory Visit: Payer: 59 | Attending: Internal Medicine

## 2019-08-12 DIAGNOSIS — Z23 Encounter for immunization: Secondary | ICD-10-CM

## 2019-08-12 NOTE — Progress Notes (Signed)
   Covid-19 Vaccination Clinic  Name:  MARCELL ESCOVEDO    MRN: [id number] DOB: [birth year]  08/12/2019  Ms. Meer was observed post Covid-19 immunization for 15 minutes without incident. She was provided with Vaccine Information Sheet and instruction to access the V-Safe system.   Ms. Reischl was instructed to call 911 with any severe reactions post vaccine: Marland Kitchen Difficulty breathing  . Swelling of face and throat  . A fast heartbeat  . A bad rash all over body  . Dizziness and weakness   Immunizations Administered    Name Date Dose VIS Date Route   Pfizer COVID-19 Vaccine 08/12/2019  1:07 PM 0.3 mL 06/12/2018 Intramuscular   Manufacturer: [place]   Lot: [id number]   [place]: [id number]

## 2019-08-13 ENCOUNTER — Inpatient Hospital Stay: Payer: 59

## 2019-08-13 ENCOUNTER — Other Ambulatory Visit: Payer: Self-pay

## 2019-08-13 VITALS — BP 135/72 | HR 72 | Temp 97.8°F | Resp 17

## 2019-08-13 DIAGNOSIS — D508 Other iron deficiency anemias: Secondary | ICD-10-CM | POA: Diagnosis not present

## 2019-08-13 MED ORDER — SODIUM CHLORIDE 0.9 % IV SOLN
200.0000 mg | Freq: Once | INTRAVENOUS | Status: AC
  Administered 2019-08-13: 200 mg via INTRAVENOUS
  Filled 2019-08-13: qty 200

## 2019-08-13 MED ORDER — SODIUM CHLORIDE 0.9 % IV SOLN
Freq: Once | INTRAVENOUS | Status: AC
  Filled 2019-08-13: qty 250

## 2019-08-13 NOTE — Patient Instructions (Signed)

## 2019-08-14 ENCOUNTER — Telehealth: Payer: Self-pay | Admitting: Family

## 2019-08-14 NOTE — Telephone Encounter (Signed)
Appointments scheduled calendar mailed per 4/27 staff message

## 2019-08-23 ENCOUNTER — Ambulatory Visit (INDEPENDENT_AMBULATORY_CARE_PROVIDER_SITE_OTHER): Payer: 59 | Admitting: Psychology

## 2019-08-23 DIAGNOSIS — F410 Panic disorder [episodic paroxysmal anxiety] without agoraphobia: Secondary | ICD-10-CM

## 2019-08-25 ENCOUNTER — Encounter: Payer: Self-pay | Admitting: Family Medicine

## 2019-08-26 NOTE — Telephone Encounter (Signed)
Dr Einar Pheasant, does patient need an appointment to discuss first? TOC on 10/10/19

## 2019-08-27 ENCOUNTER — Encounter: Payer: Self-pay | Admitting: Family Medicine

## 2019-08-29 ENCOUNTER — Other Ambulatory Visit: Payer: Self-pay

## 2019-08-29 ENCOUNTER — Ambulatory Visit: Payer: 59 | Admitting: Family Medicine

## 2019-08-29 ENCOUNTER — Encounter: Payer: Self-pay | Admitting: Family Medicine

## 2019-08-29 VITALS — BP 128/86 | HR 80 | Temp 98.3°F | Resp 20 | Ht 64.5 in | Wt 238.5 lb

## 2019-08-29 DIAGNOSIS — M2550 Pain in unspecified joint: Secondary | ICD-10-CM

## 2019-08-29 DIAGNOSIS — M791 Myalgia, unspecified site: Secondary | ICD-10-CM

## 2019-08-29 DIAGNOSIS — G43809 Other migraine, not intractable, without status migrainosus: Secondary | ICD-10-CM

## 2019-08-29 NOTE — Patient Instructions (Signed)
Headache Prevention  Riboflavin has been shown to be effective in reducing the frequency of migraine headaches. This is a supplement that is safe and well tolerated. The only side effect is a change in urine color.   Take 400 mg of Riboflavin daily.   I recommend picking up a bottle and taking the medication until you finish the bottle. If it helped, continued. If no improvement or change, then do not purchase another bottle.    Ok to just stop the propranolol   I have placed a referral to a specialist for you. You should receive a phone call from the specialty office. Make sure your voicemail is not full and that if you are able to answer your phone to unknown or new numbers.   It may take up to 2 weeks to hear about the referral. If you do not hear anything in 2 weeks, please call our office and ask to speak with the referral coordinator.

## 2019-08-29 NOTE — Assessment & Plan Note (Signed)
Long hx 40 years of migraines and now 2-3 per week. Propranolol not working to treat symptoms - trial of riboflavin discussed. Stop propranolol. Referral to neurology to discuss other abortive and prophylaxis option if no change with riboflavin.

## 2019-08-29 NOTE — Addendum Note (Signed)
Addended by: [name] on: 08/29/2019 04:53 PM   Modules accepted: Orders

## 2019-08-29 NOTE — Assessment & Plan Note (Signed)
Pt takes tramadol prn 2-3 times a week for severe symptoms. Autoimmune work-up thus far negative apart from elevated CRP. Referral to rheumatology for further evaluation/diagnosis.

## 2019-08-29 NOTE — Progress Notes (Signed)
Subjective:     Patricia Bauer is a 49 y.o. female presenting for Headache (Propranolol not working as well for migraines)     HPI  #Migraines - started when she was 49 years old - has been on propranolol for ~1 year - was on higher dose but had to decrease due to side effects - tried doing 20 mg BID w/o improvement - Failed treatment: Topimax - Migraines - 2-3 times a week on "good week" and they last from 6 hours to 3 days - Treatment: Imitrex if she catches it in time but ends up with intractable symptoms because she wakes up with HA - will also try Excedrin migraine but avoids this often due to caffeine  - went to neurology in the past - but is not interested in botox   #arthralgia/Mylagia - takes tramadol 2-3 times a week - joint pain did improve - saw Rheumaology a while ago  - did IV iron infusion w/o improvemen - has had normal blood work -   Review of Systems   Social History   Tobacco Use  Smoking Status Never Smoker  Smokeless Tobacco Never Used        Objective:    BP Readings from Last 3 Encounters:  08/29/19 128/86  08/13/19 135/72  08/06/19 130/85   Wt Readings from Last 3 Encounters:  08/29/19 238 lb 8 oz (108.2 kg)  07/22/19 238 lb (108 kg)  05/24/19 229 lb 1.9 oz (103.9 kg)    BP 128/86   Pulse 80   Temp 98.3 F (36.8 C)   Resp 20   Ht 5' 4.5" (1.638 m)   Wt 238 lb 8 oz (108.2 kg)   LMP 09/07/2009   SpO2 96%   BMI 40.31 kg/m    Physical Exam Constitutional:      General: She is not in acute distress.    Appearance: She is well-developed. She is not diaphoretic.  HENT:     Right Ear: External ear normal.     Left Ear: External ear normal.  Eyes:     Conjunctiva/sclera: Conjunctivae normal.  Cardiovascular:     Rate and Rhythm: Normal rate.  Pulmonary:     Effort: Pulmonary effort is normal.  Musculoskeletal:     Cervical back: Neck supple.  Skin:    General: Skin is warm and dry.     Capillary Refill: Capillary  refill takes less than 2 seconds.  Neurological:     Mental Status: She is alert. Mental status is at baseline.  Psychiatric:        Mood and Affect: Mood normal.        Behavior: Behavior normal.           Assessment & Plan:   Problem List Items Addressed This Visit      Cardiovascular and Mediastinum   Migraine headache - Primary    Long hx 40 years of migraines and now 2-3 per week. Propranolol not working to treat symptoms - trial of riboflavin discussed. Stop propranolol. Referral to neurology to discuss other abortive and prophylaxis option if no change with riboflavin.       Relevant Orders   Ambulatory referral to Rheumatology   Ambulatory referral to Neurology     Other   Arthralgia    Pt takes tramadol prn 2-3 times a week for severe symptoms. Autoimmune work-up thus far negative apart from elevated CRP. Referral to rheumatology for further evaluation/diagnosis.       Myalgia  Return if symptoms worsen or fail to improve.  [name], MD

## 2019-09-06 ENCOUNTER — Ambulatory Visit (INDEPENDENT_AMBULATORY_CARE_PROVIDER_SITE_OTHER): Payer: 59 | Admitting: Psychology

## 2019-09-06 DIAGNOSIS — F410 Panic disorder [episodic paroxysmal anxiety] without agoraphobia: Secondary | ICD-10-CM

## 2019-09-18 ENCOUNTER — Encounter: Payer: Self-pay | Admitting: Family Medicine

## 2019-09-20 ENCOUNTER — Ambulatory Visit (INDEPENDENT_AMBULATORY_CARE_PROVIDER_SITE_OTHER): Payer: 59 | Admitting: Psychology

## 2019-09-20 DIAGNOSIS — F410 Panic disorder [episodic paroxysmal anxiety] without agoraphobia: Secondary | ICD-10-CM | POA: Diagnosis not present

## 2019-09-26 ENCOUNTER — Ambulatory Visit: Payer: 59 | Admitting: Family Medicine

## 2019-09-26 ENCOUNTER — Telehealth: Payer: Self-pay

## 2019-09-26 NOTE — Telephone Encounter (Signed)
[facility] - Client Nonclinical Telephone Record AccessNurse Client [place] [facility] - Client Client Site [place] Physician Waunita Schooner- MD Contact Type Call Who Is Calling Patient / Member / Family / Caregiver Caller Name [place] Phone Number [phone] Patient Name Patricia Bauer Patient DOB 07-13-1970 Call Type Message Only Information Provided Reason for Call Request to [facility] Appointment Initial Comment Caller needs to cancel her appt today at 4:20PM since she developed a cough. Additional Comment Disp. Time Disposition Final User 09/26/2019 7:28:29 AM General Information Provided Yes Sundra Aland Call Closed By: Sundra Aland Transaction Date/Time: 09/26/2019 7:26:26 AM (ET)

## 2019-09-30 ENCOUNTER — Other Ambulatory Visit: Payer: Self-pay

## 2019-09-30 ENCOUNTER — Ambulatory Visit (INDEPENDENT_AMBULATORY_CARE_PROVIDER_SITE_OTHER): Payer: 59

## 2019-09-30 ENCOUNTER — Ambulatory Visit
Admission: RE | Admit: 2019-09-30 | Discharge: 2019-09-30 | Disposition: A | Discharge status: Home / Self Care | Payer: 59 | Source: Ambulatory Visit | Attending: Family Medicine | Admitting: Family Medicine

## 2019-09-30 VITALS — BP 136/94 | HR 87 | Temp 98.8°F | Resp 18 | Ht 64.0 in | Wt 238.0 lb

## 2019-09-30 DIAGNOSIS — M25521 Pain in right elbow: Secondary | ICD-10-CM

## 2019-09-30 DIAGNOSIS — M7711 Lateral epicondylitis, right elbow: Secondary | ICD-10-CM

## 2019-09-30 MED ORDER — CYCLOBENZAPRINE HCL 10 MG PO TABS
10.0000 mg | ORAL_TABLET | Freq: Two times a day (BID) | ORAL | 0 refills | Status: DC | PRN
Start: 2019-09-30 — End: 2019-12-31

## 2019-09-30 MED ORDER — PREDNISONE 10 MG PO TABS
ORAL_TABLET | ORAL | 0 refills | Status: DC
Start: 2019-09-30 — End: 2019-10-10

## 2019-09-30 NOTE — ED Triage Notes (Signed)
Patient c/o right elbow pain and swelling that started 1-2 months ago. She also reports decreased mobility in that elbow.

## 2019-09-30 NOTE — ED Provider Notes (Signed)
[facility] ____________________________________________  Time seen: Approximately 8:10 PM  I have reviewed the triage vital signs and the nursing notes.   HISTORY  Chief Complaint Elbow Pain  HPI Patricia Bauer is a 49 y.o. female presenting for evaluation of right elbow pain.  Patient reports this is been gradual onset pain over the last 2 months.  Reports she is right-hand dominant.  States pain is to the side of her elbow and with certain movements pain shoots down her forearm and up her right arm.  Denies fall, direct injury or direct trauma.  Does a lot of repetitive hand movements with right arm.  States pain causes her to guard her arm and not move it as much and limits her movement.  Denies current pain radiation.  Denies paresthesias.  Again denies any trauma.  States this affects her grip strength in her right hand.  Denies upper arm pain.  No redness or skin changes.  Has tried over-the-counter topical analgesic creams without resolution as well as ice.  Denies other alleviating factors.  Reports otherwise doing well.  Denies fevers, chest pain or shortness of breath or recent sickness.  [name], MD : PCP    Past Medical History:  Diagnosis Date   Anemia    Anxiety    Colon polyp    Depression    GERD (gastroesophageal reflux disease)    Headache    migraines    History of hysterectomy    still has both ovaries   History of migraine headaches    PONV (postoperative nausea and vomiting)     Patient Active Problem List   Diagnosis Date Noted   Back injury 03/27/2019   Fatigue 01/15/2019   Myalgia 04/23/2018   IDA (iron deficiency anemia) 02/16/2018   Vitamin D deficiency 01/22/2018   Right foot pain 01/22/2018   History of partial knee replacement left 07/14/2017   De Quervain's tenosynovitis, right 03/02/2017   Metatarsalgia, right foot 03/02/2017   Major depressive disorder, recurrent, severe without psychotic  features ([place]) 01/15/2015   GAD (generalized anxiety disorder) 01/15/2015   Panic disorder without agoraphobia 01/15/2015   Arthralgia 11/12/2014   Hemorrhoid prolapse 11/09/2014   Obesity 09/08/2014   OA (osteoarthritis) of knee 02/10/2014   Personal history of colonic polyps 10/16/2012   History of headache 03/08/2012   History of hysterectomy, supracervical 10/23/2011   Anemia 09/08/2011   GERD (gastroesophageal reflux disease) 09/08/2011   Anxiety 09/08/2011   Migraine headache 09/08/2011   H/O: hysterectomy 09/08/2011    Past Surgical History:  Procedure Laterality Date   ABDOMINAL HYSTERECTOMY     COLONOSCOPY     KNEE ARTHROPLASTY     KNEE ARTHROSCOPY     knee athroscopy  5/12   LT   LAPAROSCOPIC VAGINAL HYSTERECTOMY  5/11   Supra cervical   left partial knee replacement     PARTIAL KNEE ARTHROPLASTY Left 02/10/2014   Procedure: LEFT MEDIAL UNICOMPARTMENTAL KNEE ARTHROPLASTY;  Surgeon: Gearlean Alf, MD;  Location: [facility];  Service: Orthopedics;  Laterality: Left;   UPPER GI ENDOSCOPY     WISDOM TOOTH EXTRACTION  1995     No current facility-administered medications for this encounter.  Current Outpatient Medications:    ALPRAZolam (XANAX) 0.25 MG tablet, Take 1 tablet (0.25 mg total) by mouth at bedtime., Disp: 30 tablet, Rfl: 0   citalopram (CELEXA) 40 MG tablet, Take 1 tablet (40 mg total) by mouth daily., Disp: 90 tablet, Rfl: 3  cyclobenzaprine (FLEXERIL) 5 MG tablet, Take 1 tablet (5 mg total) by mouth 3 (three) times daily as needed for muscle spasms., Disp: 90 tablet, Rfl: 1   pantoprazole (PROTONIX) 40 MG tablet, TAKE 1 TABLET BY MOUTH  DAILY, Disp: 90 tablet, Rfl: 3   SUMAtriptan (IMITREX) 50 MG tablet, TAKE 1 TABLET MAY REPEAT IN 2 HOURS IF HEADACHE PERSISTS OR RECURS. DON'T USE MORE THAN TWO IN 24HRS, Disp: 90 tablet, Rfl: 2   traMADol (ULTRAM) 50 MG tablet, Take 1-2q6h prn, Disp: 30 tablet, Rfl: 5   cyclobenzaprine  (FLEXERIL) 10 MG tablet, Take 1 tablet (10 mg total) by mouth 2 (two) times daily as needed (pain). Do not drive while taking as can cause drowsiness, Disp: 15 tablet, Rfl: 0   predniSONE (DELTASONE) 10 MG tablet, Take 60mg  orally day one, then 55 mg orally day two, then 50 mg orally day three, then taper by 5 mg daily over 12 days until complete., Disp: 35 tablet, Rfl: 0   propranolol (INDERAL) 40 MG tablet, TAKE 1/2 TABLET TWICE A DAY (D/C ER/START IR), Disp: 90 tablet, Rfl: 3  Allergies Ibuprofen, Diflucan [fluconazole], Onion, Topamax [topiramate], and Hydrocodone  Family History  Problem Relation Age of Onset   Stroke Father    Heart attack Father    Hyperlipidemia Mother    Hypertension Mother    Breast cancer Maternal Aunt    Colon cancer Neg Hx    Colon polyps Neg Hx    Kidney disease Neg Hx    Gallbladder disease Neg Hx    Esophageal cancer Neg Hx    Alcohol abuse Neg Hx    Drug abuse Neg Hx    Depression Neg Hx    Bipolar disorder Neg Hx    Anxiety disorder Neg Hx    Schizophrenia Neg Hx    Suicidality Neg Hx     Social History Social History   Tobacco Use   Smoking status: Never Smoker   Smokeless tobacco: Never Used  Vaping Use   Vaping Use: Never used  Substance Use Topics   Alcohol use: Yes    Alcohol/week: 0.0 standard drinks    Comment: occassional- once a month has about 2-3 drinks   Drug use: No    Review of Systems Constitutional: No fever Cardiovascular: Denies chest pain. Respiratory: Denies shortness of breath. Musculoskeletal: Positive right arm pain. Skin: Negative for rash. Neurological: Negative for numbness.  ____________________________________________   PHYSICAL EXAM:  VITAL SIGNS: ED Triage Vitals  Enc Vitals Group     BP 09/30/19 1911 (!) 136/94     Pulse Rate 09/30/19 1911 87     Resp 09/30/19 1911 18     Temp 09/30/19 1911 98.8 F (37.1 C)     Temp Source 09/30/19 1911 Oral     SpO2 09/30/19 1911  98 %     Weight 09/30/19 1909 238 lb (108 kg)     Height 09/30/19 1909 5\' 4"  (1.626 m)     Head Circumference --      Peak Flow --      Pain Score 09/30/19 1909 4     Pain Loc --      Pain Edu? --      Excl. in [place]? --     Constitutional: Alert and oriented. Well appearing and in no acute distress. Eyes: Conjunctivae are normal.  ENT      Head: Normocephalic and atraumatic. Cardiovascular: Good peripheral circulation. Respiratory: Normal respiratory effort without tachypnea nor retractions.  Musculoskeletal: Steady gait.  Bilateral distal radial pulses equal and easy palpated.  Bilateral hand grip strong, right weaker than left. Except: Right elbow lateral epicondyle tenderness direct palpation with mild localized swelling, no erythema, no ecchymosis, pain with flexion and extension as well as supination but full range of motion present, right upper semiotherwise nontender.  Bilateral upper extremities normal sensation. Neurologic:  Normal speech and language. Speech is normal. No gait instability.  Skin:  Skin is warm, dry and intact. No rash noted. Psychiatric: Mood and affect are normal. Speech and behavior are normal. Patient exhibits appropriate insight and judgment   ___________________________________________   LABS (all labs ordered are listed, but only abnormal results are displayed)  Labs Reviewed - No data to display ____________________________________________  RADIOLOGY  DG Elbow Complete Right  Result Date: 09/30/2019 CLINICAL DATA:  Pain with decreased motion EXAM: RIGHT ELBOW - COMPLETE 3+ VIEW COMPARISON:  None. FINDINGS: There is no evidence of fracture, dislocation, or joint effusion. There is no evidence of arthropathy or other focal bone abnormality. Soft tissues are unremarkable. IMPRESSION: Negative. Electronically Signed   By: Donavan Foil M.D.   On: 09/30/2019 19:34   ____________________________________________   PROCEDURES Procedures    INITIAL  IMPRESSION / ASSESSMENT AND PLAN / ED COURSE  Pertinent labs & imaging results that were available during my care of the patient were reviewed by me and considered in my medical decision making (see chart for details).  Well-appearing patient.  No acute distress.  Right elbow pain as above.  Right elbow x-ray has been radiologist, reviewed, negative.  Suspect lateral epicondylitis.  Patient is allergic to ibuprofen and states she is unable to tolerate NSAIDs, will treat with prednisone taper, counseled to monitor sugar.  As needed Flexeril.  Discussed ice, stretching, avoidance of repetitive movements and supportive care.  Follow-up with orthopedic as needed for continued pain.Discussed indication, risks and benefits of medications with patient.   Discussed follow up with Primary care physician this week. Discussed follow up and return parameters including no resolution or any worsening concerns. Patient verbalized understanding and agreed to plan.   ____________________________________________   FINAL CLINICAL IMPRESSION(S) / ED DIAGNOSES  Final diagnoses:  Right elbow pain  Right lateral epicondylitis     ED Discharge Orders         Ordered    predniSONE (DELTASONE) 10 MG tablet     Discontinue  Reprint     09/30/19 1958    cyclobenzaprine (FLEXERIL) 10 MG tablet  2 times daily PRN     Discontinue  Reprint     09/30/19 1958           Note: This dictation was prepared with Dragon dictation along with smaller phrase technology. Any transcriptional errors that result from this process are unintentional.         Marylene Land, NP 09/30/19 2020

## 2019-09-30 NOTE — Discharge Instructions (Addendum)
Take medication as prescribed. Rest. Ice. Avoid repetitive movements.   Follow-up with orthopedic for continued pain.  Follow up with your primary care physician this week as needed. Return to Urgent care for new or worsening concerns.

## 2019-10-04 ENCOUNTER — Ambulatory Visit: Payer: 59 | Admitting: Psychology

## 2019-10-10 ENCOUNTER — Other Ambulatory Visit: Payer: Self-pay

## 2019-10-10 ENCOUNTER — Ambulatory Visit: Payer: 59 | Admitting: Family Medicine

## 2019-10-10 ENCOUNTER — Encounter: Payer: Self-pay | Admitting: Family Medicine

## 2019-10-10 VITALS — BP 110/90 | HR 102 | Temp 97.9°F | Ht 65.0 in | Wt 247.0 lb

## 2019-10-10 DIAGNOSIS — L812 Freckles: Secondary | ICD-10-CM | POA: Diagnosis not present

## 2019-10-10 DIAGNOSIS — R208 Other disturbances of skin sensation: Secondary | ICD-10-CM | POA: Insufficient documentation

## 2019-10-10 DIAGNOSIS — F410 Panic disorder [episodic paroxysmal anxiety] without agoraphobia: Secondary | ICD-10-CM

## 2019-10-10 MED ORDER — ALPRAZOLAM 0.25 MG PO TABS: 0.2500 mg | ORAL_TABLET | Freq: Every day | ORAL | 1 refills | Status: DC | PRN

## 2019-10-10 NOTE — Patient Instructions (Signed)
Great to see you again  1) Numbness - discuss with neurology and dermatology if still present - if you blood counts are low we can consider getting a B12 level checked  2) Anxiety - if worsening we could consider adding Buspar to see if that decreases panic attacks

## 2019-10-10 NOTE — Assessment & Plan Note (Signed)
Stable on celexa with xanax prn. Contract today for ~#60 tablets in 6 month period. Discussed that if worsening we could consider adding buspar in the future, but she manages well otherwise.

## 2019-10-10 NOTE — Assessment & Plan Note (Signed)
Fair skin with diffuse freckles. Reassurance regarding a few areas of concern which appeared benign, however, agree that a dermatology skin check would be appropriate given skin type. Referral placed.

## 2019-10-10 NOTE — Progress Notes (Signed)
Subjective:     Patricia Bauer is a 49 y.o. female presenting for Establish Care (Transfer from Dr. Deborra Medina) and Numbness (right leg)     HPI  #Panic disorder with agoraphobia - taking celexa - if at home will take xanax once a week - typically needing it if she has to go out about  Pain - Tramadol up to #30 1 refill for 6 months  #Numbness - right leg - noticed it last month - hx of partial knee replacement on the left leg with chronic numbness and now with similar sensation on the right side - denies back pain - no pain in the leg  - no swelling - no tingling - no weakness in the leg   #skin lesion - hypopigmented raised lesions on her arm  Review of Systems   Social History   Tobacco Use  Smoking Status Never Smoker  Smokeless Tobacco Never Used        Objective:    BP Readings from Last 3 Encounters:  10/10/19 110/90  09/30/19 (!) 136/94  08/29/19 128/86   Wt Readings from Last 3 Encounters:  10/10/19 247 lb (112 kg)  09/30/19 238 lb (108 kg)  08/29/19 238 lb 8 oz (108.2 kg)    BP 110/90   Pulse (!) 102   Temp 97.9 F (36.6 C) (Temporal)   Ht 5\' 5"  (1.651 m)   Wt 247 lb (112 kg)   LMP 09/07/2009   SpO2 97%   BMI 41.10 kg/m    Physical Exam Constitutional:      General: She is not in acute distress.    Appearance: She is well-developed. She is not diaphoretic.  HENT:     Right Ear: External ear normal.     Left Ear: External ear normal.     Nose: Nose normal.  Eyes:     Conjunctiva/sclera: Conjunctivae normal.  Cardiovascular:     Rate and Rhythm: Normal rate.  Pulmonary:     Effort: Pulmonary effort is normal.  Musculoskeletal:     Cervical back: Neck supple.     Comments: Back:  Inspection: no abnormalities Palpation: no spinous or paraspinous ttp ROM: deferred Strength: LE strength normal LE reflexes normal Sensation on lateral right thigh intact but diminished compared to the left thigh. Other sensation normal.     Skin:    General: Skin is warm and dry.     Capillary Refill: Capillary refill takes less than 2 seconds.     Comments: Has 2 hypopigmented lesions on the left forearm as well as other ulcers which are healing. Suspect scars from previous picking injuries. 1 raise verrucous lesion on the left bicep area.   Neurological:     Mental Status: She is alert. Mental status is at baseline.  Psychiatric:        Mood and Affect: Mood is anxious.        Behavior: Behavior normal.           Assessment & Plan:   Problem List Items Addressed This Visit      Musculoskeletal and Integument   Freckles    Fair skin with diffuse freckles. Reassurance regarding a few areas of concern which appeared benign, however, agree that a dermatology skin check would be appropriate given skin type. Referral placed.       Relevant Orders   Ambulatory referral to Dermatology     Other   Panic disorder without agoraphobia - Primary    Stable on  celexa with xanax prn. Contract today for ~#60 tablets in 6 month period. Discussed that if worsening we could consider adding buspar in the future, but she manages well otherwise.       Relevant Medications   ALPRAZolam (XANAX) 0.25 MG tablet   Decreased sensation    Reviewed recent blood work including CMP and CBC which were normal. As normal strength and reflexes, discussed watch and wait. She has a neurology appt next month - advised discussing with them to see if further work-up indicated.           Return in about 6 months (around 04/10/2020) for Or when needing refills.  [name], MD  This visit occurred during the SARS-CoV-2 public health emergency.  Safety protocols were in place, including screening questions prior to the visit, additional usage of staff PPE, and extensive cleaning of exam room while observing appropriate contact time as indicated for disinfecting solutions.

## 2019-10-10 NOTE — Assessment & Plan Note (Signed)
Reviewed recent blood work including CMP and CBC which were normal. As normal strength and reflexes, discussed watch and wait. She has a neurology appt next month - advised discussing with them to see if further work-up indicated.

## 2019-10-18 ENCOUNTER — Ambulatory Visit: Payer: 59 | Admitting: Psychology

## 2019-10-22 ENCOUNTER — Inpatient Hospital Stay: Payer: 59 | Admitting: Family

## 2019-10-22 ENCOUNTER — Inpatient Hospital Stay: Payer: 59

## 2019-10-23 DIAGNOSIS — H53149 Visual discomfort, unspecified: Secondary | ICD-10-CM | POA: Insufficient documentation

## 2019-10-23 DIAGNOSIS — R200 Anesthesia of skin: Secondary | ICD-10-CM | POA: Insufficient documentation

## 2019-10-23 DIAGNOSIS — F40298 Other specified phobia: Secondary | ICD-10-CM | POA: Insufficient documentation

## 2019-10-23 DIAGNOSIS — R202 Paresthesia of skin: Secondary | ICD-10-CM | POA: Insufficient documentation

## 2019-10-23 DIAGNOSIS — R519 Headache, unspecified: Secondary | ICD-10-CM | POA: Insufficient documentation

## 2019-11-01 ENCOUNTER — Ambulatory Visit (INDEPENDENT_AMBULATORY_CARE_PROVIDER_SITE_OTHER): Payer: 59 | Admitting: Psychology

## 2019-11-01 DIAGNOSIS — F410 Panic disorder [episodic paroxysmal anxiety] without agoraphobia: Secondary | ICD-10-CM

## 2019-11-06 ENCOUNTER — Encounter: Payer: Self-pay | Admitting: Family Medicine

## 2019-11-06 ENCOUNTER — Inpatient Hospital Stay (HOSPITAL_BASED_OUTPATIENT_CLINIC_OR_DEPARTMENT_OTHER): Payer: 59 | Admitting: Family

## 2019-11-06 ENCOUNTER — Encounter: Payer: Self-pay | Admitting: Family

## 2019-11-06 ENCOUNTER — Inpatient Hospital Stay: Payer: 59 | Attending: Family

## 2019-11-06 ENCOUNTER — Other Ambulatory Visit: Payer: Self-pay

## 2019-11-06 VITALS — BP 141/90 | HR 99 | Temp 98.1°F | Resp 19 | Ht 65.0 in | Wt 245.1 lb

## 2019-11-06 DIAGNOSIS — Z79899 Other long term (current) drug therapy: Secondary | ICD-10-CM | POA: Diagnosis not present

## 2019-11-06 DIAGNOSIS — D508 Other iron deficiency anemias: Secondary | ICD-10-CM | POA: Insufficient documentation

## 2019-11-06 DIAGNOSIS — K909 Intestinal malabsorption, unspecified: Secondary | ICD-10-CM | POA: Diagnosis not present

## 2019-11-06 LAB — RETICULOCYTES
Immature Retic Fract: 16.1 % — ABNORMAL HIGH (ref 2.3–15.9)
RBC.: 4.84 MIL/uL (ref 3.87–5.11)
Retic Count, Absolute: 138.9 10*3/uL (ref 19.0–186.0)
Retic Ct Pct: 2.9 % (ref 0.4–3.1)

## 2019-11-06 LAB — CBC WITH DIFFERENTIAL (CANCER CENTER ONLY)
Abs Immature Granulocytes: 0.04 10*3/uL (ref 0.00–0.07)
Basophils Absolute: 0 10*3/uL (ref 0.0–0.1)
Basophils Relative: 0 %
Eosinophils Absolute: 0.1 10*3/uL (ref 0.0–0.5)
Eosinophils Relative: 1 %
HCT: 44.1 % (ref 36.0–46.0)
Hemoglobin: 14.8 g/dL (ref 12.0–15.0)
Immature Granulocytes: 0 %
Lymphocytes Relative: 34 %
Lymphs Abs: 3.2 10*3/uL (ref 0.7–4.0)
MCH: 31 pg (ref 26.0–34.0)
MCHC: 33.6 g/dL (ref 30.0–36.0)
MCV: 92.3 fL (ref 80.0–100.0)
Monocytes Absolute: 0.6 10*3/uL (ref 0.1–1.0)
Monocytes Relative: 6 %
Neutro Abs: 5.5 10*3/uL (ref 1.7–7.7)
Neutrophils Relative %: 59 %
Platelet Count: 307 10*3/uL (ref 150–400)
RBC: 4.78 MIL/uL (ref 3.87–5.11)
RDW: 12.8 % (ref 11.5–15.5)
WBC Count: 9.4 10*3/uL (ref 4.0–10.5)
nRBC: 0 % (ref 0.0–0.2)

## 2019-11-06 NOTE — Progress Notes (Signed)
Hematology and Oncology Follow Up Visit  Patricia Bauer [id number] May 28, 1970 49 y.o. 11/06/2019   Principle Diagnosis:  Iron deficiency anemia secondary to malabsorption  Current Therapy: IV iron as indicated    Interim History:  Patricia Bauer is here today for follow-up. She is doing fairly well but does still have some fatigue.  She did well with her IV iron infusions in April and did note some improvement.  She has not noted any blood loss. No bruising or petechiae.  She has agoraphobia but did awesome coming in for her visit today. She has occasional SOB with both over exertion and anxiety.  She also has chest discomfort and palpitations with anxiety.  No fever, chills, n/v, cough, rash, dizziness, abdominal pain or changes in bowel or bladder habits.  No swelling, tenderness, numbness or tingling in her extremities.  No falls or syncope.  She has maintained a good appetite and is staying well hydrated. Her weight is stable.   ECOG Performance Status: 1 - Symptomatic but completely ambulatory  Medications:  Allergies as of 11/06/2019      Reactions   Ibuprofen Swelling   Bilateral swelling of ankles and feet   Diflucan [fluconazole] Other (See Comments)   Pelvic pain   Onion Other (See Comments)   migraines    Topamax [topiramate] Other (See Comments)   Reaction:  Body aches    Hydrocodone Anxiety, Palpitations, Other (See Comments)   Reaction:  Migraines       Medication List       Accurate as of November 06, 2019  3:59 PM. If you have any questions, ask your nurse or doctor.        STOP taking these medications   pantoprazole 40 MG tablet Commonly known as: PROTONIX Stopped by: Laverna Peace, NP   Turmeric 500 MG Caps Stopped by: Laverna Peace, NP     TAKE these medications   ALPRAZolam 0.25 MG tablet Commonly known as: XANAX Take 1 tablet (0.25 mg total) by mouth daily as needed for anxiety or sleep.   citalopram 40 MG tablet Commonly  known as: CELEXA Take 1 tablet (40 mg total) by mouth daily.   cyclobenzaprine 5 MG tablet Commonly known as: FLEXERIL Take 1 tablet (5 mg total) by mouth 3 (three) times daily as needed for muscle spasms.   cyclobenzaprine 10 MG tablet Commonly known as: FLEXERIL Take 1 tablet (10 mg total) by mouth 2 (two) times daily as needed (pain). Do not drive while taking as can cause drowsiness   nortriptyline 10 MG capsule Commonly known as: PAMELOR Start Nortriptyline (Pamelor) 10 mg nightly for one week, then increase to 20 mg nightly   RIBOFLAVIN PO Take 400 mg by mouth daily.   SUMAtriptan 50 MG tablet Commonly known as: IMITREX TAKE 1 TABLET MAY REPEAT IN 2 HOURS IF HEADACHE PERSISTS OR RECURS. DON'T USE MORE THAN TWO IN 24HRS   traMADol 50 MG tablet Commonly known as: ULTRAM Take 1-2q6h prn       Allergies:  Allergies  Allergen Reactions  . Ibuprofen Swelling    Bilateral swelling of ankles and feet  . Diflucan [Fluconazole] Other (See Comments)    Pelvic pain  . Onion Other (See Comments)    migraines   . Topamax [Topiramate] Other (See Comments)    Reaction:  Body aches    . Hydrocodone Anxiety, Palpitations and Other (See Comments)    Reaction:  Migraines     Past Medical History, Surgical history, Social  history, and Family History were reviewed and updated.  Review of Systems: All other 10 point review of systems is negative.   Physical Exam:  height is 5\' 5"  (1.651 m) and weight is 245 lb 1.9 oz (111.2 kg). Her oral temperature is 98.1 F (36.7 C). Her blood pressure is 141/90 (abnormal) and her pulse is 99. Her respiration is 19 and oxygen saturation is 98%.   Wt Readings from Last 3 Encounters:  11/06/19 245 lb 1.9 oz (111.2 kg)  10/10/19 247 lb (112 kg)  09/30/19 238 lb (108 kg)    Ocular: Sclerae unicteric, pupils equal, round and reactive to light Ear-nose-throat: Oropharynx clear, dentition fair Lymphatic: No cervical or supraclavicular  adenopathy Lungs no rales or rhonchi, good excursion bilaterally Heart regular rate and rhythm, no murmur appreciated Abd soft, nontender, positive bowel sounds, no liver or spleen tip palpated on exam, no fluid wave  MSK no focal spinal tenderness, no joint edema Neuro: non-focal, well-oriented, appropriate affect Breasts: Deferred   Lab Results  Component Value Date   WBC 9.4 11/06/2019   HGB 14.8 11/06/2019   HCT 44.1 11/06/2019   MCV 92.3 11/06/2019   PLT 307 11/06/2019   Lab Results  Component Value Date   FERRITIN 17 07/22/2019   IRON 46 07/22/2019   TIBC 385 07/22/2019   UIBC 339 07/22/2019   IRONPCTSAT 12 (L) 07/22/2019   Lab Results  Component Value Date   RETICCTPCT 2.9 11/06/2019   RBC 4.78 11/06/2019   RBC 4.84 11/06/2019   No results found for: KPAFRELGTCHN, LAMBDASER, KAPLAMBRATIO No results found for: IGGSERUM, IGA, IGMSERUM No results found for: [name], SPEI   Chemistry      Component Value Date/Time   NA 139 05/24/2019 1359   NA 140 04/23/2018 1440   NA 139 03/13/2013 2022   K 3.9 05/24/2019 1359   K 3.6 03/13/2013 2022   CL 100 05/24/2019 1359   CL 106 03/13/2013 2022   CO2 30 05/24/2019 1359   CO2 26 03/13/2013 2022   BUN 14 05/24/2019 1359   BUN 10 04/23/2018 1440   BUN 11 03/13/2013 2022   CREATININE 1.05 (H) 05/24/2019 1359   CREATININE 0.80 03/13/2013 2022   GLU 94 11/23/2017 0000      Component Value Date/Time   CALCIUM 9.5 05/24/2019 1359   CALCIUM 8.7 03/13/2013 2022   ALKPHOS 71 05/24/2019 1359   AST 27 05/24/2019 1359   ALT 32 05/24/2019 1359   BILITOT 0.3 05/24/2019 1359       Impression and Plan: Patricia Bauer is a very pleasant 49yo caucasian female with iron deficiency anemia secondary to malabsorption. We will see what her iron studies look like and replace if needed.  We will plan to see her again in another 6 months.  She can contact our office with any  questions or concerns.   Laverna Peace, NP 7/21/20213:59 PM

## 2019-11-07 ENCOUNTER — Telehealth: Payer: Self-pay | Admitting: Hematology & Oncology

## 2019-11-07 ENCOUNTER — Telehealth: Payer: Self-pay | Admitting: Family

## 2019-11-07 LAB — FERRITIN: Ferritin: 228 ng/mL (ref 11–307)

## 2019-11-07 LAB — IRON AND TIBC
Iron: 69 ug/dL (ref 41–142)
Saturation Ratios: 19 % — ABNORMAL LOW (ref 21–57)
TIBC: 359 ug/dL (ref 236–444)
UIBC: 290 ug/dL (ref 120–384)

## 2019-11-07 NOTE — Telephone Encounter (Signed)
Appointments scheduled calendar printed & mailed per 7/21 los

## 2019-11-13 ENCOUNTER — Encounter: Payer: Self-pay | Admitting: Family

## 2019-11-15 ENCOUNTER — Ambulatory Visit (INDEPENDENT_AMBULATORY_CARE_PROVIDER_SITE_OTHER): Payer: 59 | Admitting: Psychology

## 2019-11-15 DIAGNOSIS — F411 Generalized anxiety disorder: Secondary | ICD-10-CM

## 2019-11-29 ENCOUNTER — Ambulatory Visit: Payer: 59 | Admitting: Psychology

## 2019-12-02 ENCOUNTER — Encounter: Payer: Self-pay | Admitting: Family Medicine

## 2019-12-11 ENCOUNTER — Other Ambulatory Visit: Payer: Self-pay

## 2019-12-11 ENCOUNTER — Ambulatory Visit: Payer: 59 | Admitting: Family Medicine

## 2019-12-11 VITALS — BP 142/100 | HR 79 | Temp 97.8°F | Ht 65.0 in | Wt 245.0 lb

## 2019-12-11 DIAGNOSIS — Z8742 Personal history of other diseases of the female genital tract: Secondary | ICD-10-CM

## 2019-12-11 DIAGNOSIS — G43809 Other migraine, not intractable, without status migrainosus: Secondary | ICD-10-CM

## 2019-12-11 DIAGNOSIS — R1032 Left lower quadrant pain: Secondary | ICD-10-CM

## 2019-12-11 DIAGNOSIS — R350 Frequency of micturition: Secondary | ICD-10-CM

## 2019-12-11 LAB — POC URINALSYSI DIPSTICK (AUTOMATED)
Bilirubin, UA: NEGATIVE
Blood, UA: NEGATIVE
Glucose, UA: NEGATIVE
Ketones, UA: NEGATIVE
Nitrite, UA: NEGATIVE
Protein, UA: NEGATIVE
Spec Grav, UA: 1.02 (ref 1.010–1.025)
Urobilinogen, UA: 0.2 E.U./dL
pH, UA: 6 (ref 5.0–8.0)

## 2019-12-11 MED ORDER — SUMATRIPTAN SUCCINATE 50 MG PO TABS: ORAL_TABLET | ORAL | 2 refills | Status: DC

## 2019-12-11 NOTE — Assessment & Plan Note (Addendum)
Pt with hx of ovarian cysts and now with colicky and persistent pain. Etiology unclear. Less likely kidney stone given duration or diverticulitis. Could be cyst and will evaluate with pelvic US. If negative consider CT scan. Also with concern for bladder retention will try to get post void on the [place] if possible. Treat constipation to see if that reduces symptoms

## 2019-12-11 NOTE — Patient Instructions (Addendum)
You should get a phone call about your pelvic ultrasound  If symptoms worsen or you develop fevers or chills please call immediately.   Constipation   Constipation is a common issue. Often it is related to diet and occasionally medications.   What you can do to treat your symptoms 1) Fiber -- Eat more fiber rich foods: beans, broccoli, berries, avocados, popcorn, pear/apple, green peas, turnip greens, brussels sprouts, whole grains (barley, bran, quinoa, oatmeal) -- Take a Fiber supplement: Psyllium (Metamucil)  -- Could also eat Prunes daily  2) Hydration  -- Drink more water: Try to drink 64 oz of water per day  3) Exercise -- Moderate exercise (walking, jogging, biking) for 30 minutes, 5 days a week  4) Dedicate time for Bowel movements - do not delay  5) Stool Softener  - Docusate Sodium (Colace) 100 mg daily or twice daily as needed   Treating chronic constipation is often about finding the right amount of medication and fiber to keep you regular and comfortable. For some people that may be daily metamucil and colace every other day. For others it may be Metamucil and colace twice daily and Miralax 3 times a week. The goal is to go slow and listen to your body. And normal can be anywhere from 2-3 soft bowel movements a day to 1 bowel movement every 2-3 days.

## 2019-12-11 NOTE — Assessment & Plan Note (Signed)
Doing OK. Needs refill of sumatriptan

## 2019-12-11 NOTE — Progress Notes (Signed)
Subjective:     Patricia Bauer is a 49 y.o. female presenting for Abdominal Pain (left side x 1 month w/ history of ovarian cysts) and Urinary Frequency ("can't fully empty my bladder due to abdominal pain" )     HPI  #Abdominal pain - hx of ovarian cysts - typically the flare up and then resolve - now it is lasting longer - difficulty emptying the bladder and having a BM - when using the bathroom - will get suprapubic pain - but otherwise it is in the left lower quadrant - colicky pain which occurs several times a day - random and will last for 20-30 seconds inbetween still has some pain - colicky pain comes several times a day - long periods of low level pain - has constant pain in between - typically gets similar pain once per month - but this has been constant for 1 month  Urinary frequency and difficulty emptying the bladder - no burning - no urgency  Nothing makes it worse Has taken tramadol for pain   Some vaginal discharge which is normal  No constipation or diarrhea    #obesity - exercising - watching what she eats  Review of Systems   Social History   Tobacco Use  Smoking Status Never Smoker  Smokeless Tobacco Never Used        Objective:    BP Readings from Last 3 Encounters:  12/11/19 (!) 142/100  11/06/19 (!) 141/90  10/10/19 110/90   Wt Readings from Last 3 Encounters:  12/11/19 245 lb (111.1 kg)  11/06/19 245 lb 1.9 oz (111.2 kg)  10/10/19 247 lb (112 kg)    BP (!) 142/100   Pulse 79   Temp 97.8 F (36.6 C) (Temporal)   Ht 5\' 5"  (1.651 m)   Wt 245 lb (111.1 kg)   LMP 09/07/2009   SpO2 96%   BMI 40.77 kg/m    Physical Exam Constitutional:      General: She is not in acute distress.    Appearance: She is well-developed. She is not diaphoretic.  HENT:     Right Ear: External ear normal.     Left Ear: External ear normal.     Nose: Nose normal.  Eyes:     Conjunctiva/sclera: Conjunctivae normal.  Cardiovascular:      Rate and Rhythm: Normal rate and regular rhythm.     Heart sounds: No murmur heard.   Pulmonary:     Effort: Pulmonary effort is normal. No respiratory distress.     Breath sounds: Normal breath sounds. No wheezing.  Abdominal:     General: Abdomen is flat. Bowel sounds are normal. There is no distension.     Palpations: Abdomen is soft. There is no hepatomegaly.     Tenderness: There is abdominal tenderness in the epigastric area and left lower quadrant. There is no right CVA tenderness, left CVA tenderness, guarding or rebound.  Musculoskeletal:     Cervical back: Neck supple.  Skin:    General: Skin is warm and dry.     Capillary Refill: Capillary refill takes less than 2 seconds.  Neurological:     Mental Status: She is alert. Mental status is at baseline.  Psychiatric:        Mood and Affect: Mood normal.        Behavior: Behavior normal.     UA: trace LE, no blood, no nitrites      Assessment & Plan:   Problem List Items  Addressed This Visit      Cardiovascular and Mediastinum   Migraine headache    Doing OK. Needs refill of sumatriptan      Relevant Medications   SUMAtriptan (IMITREX) 50 MG tablet     Other   Morbid obesity (HCC)    Pt is trying to exercise and eat healthy. Encouraged both and discussed goal of 5% weight loss in the next year.       LLQ pain - Primary    Pt with hx of ovarian cysts and now with colicky and persistent pain. Etiology unclear. Less likely kidney stone given duration or diverticulitis. Could be cyst and will evaluate with pelvic US. If negative consider CT scan. Also with concern for bladder retention will try to get post void on the [place] if possible. Treat constipation to see if that reduces symptoms      Relevant Orders   US PELVIC COMPLETE WITH TRANSVAGINAL    Other Visit Diagnoses    Urinary frequency       Relevant Orders   POCT Urinalysis Dipstick (Automated) (Completed)   Hx of ovarian cyst       Relevant Orders   US  PELVIC COMPLETE WITH TRANSVAGINAL       Return if symptoms worsen or fail to improve.  [name], MD  This visit occurred during the SARS-CoV-2 public health emergency.  Safety protocols were in place, including screening questions prior to the visit, additional usage of staff PPE, and extensive cleaning of exam room while observing appropriate contact time as indicated for disinfecting solutions.

## 2019-12-11 NOTE — Assessment & Plan Note (Signed)
Pt is trying to exercise and eat healthy. Encouraged both and discussed goal of 5% weight loss in the next year.

## 2019-12-17 ENCOUNTER — Other Ambulatory Visit: Payer: Self-pay | Admitting: *Deleted

## 2019-12-17 DIAGNOSIS — M2550 Pain in unspecified joint: Secondary | ICD-10-CM

## 2019-12-17 MED ORDER — TRAMADOL HCL 50 MG PO TABS: ORAL_TABLET | ORAL | 0 refills | Status: DC

## 2019-12-17 NOTE — Telephone Encounter (Signed)
Last office visit 12/11/2019 for Abd Pain/Urine Frequency.  Last refilled 05/01/2019 for #30 with 5 refills.  CPE scheduled for 12/31/2019.

## 2019-12-19 ENCOUNTER — Ambulatory Visit: Payer: 59

## 2019-12-20 ENCOUNTER — Inpatient Hospital Stay: Payer: 59 | Attending: Family

## 2019-12-20 ENCOUNTER — Other Ambulatory Visit: Payer: Self-pay

## 2019-12-20 VITALS — BP 153/90 | HR 91 | Resp 17

## 2019-12-20 DIAGNOSIS — D508 Other iron deficiency anemias: Secondary | ICD-10-CM | POA: Diagnosis present

## 2019-12-20 MED ORDER — SODIUM CHLORIDE 0.9 % IV SOLN
Freq: Once | INTRAVENOUS | Status: AC
  Filled 2019-12-20: qty 250

## 2019-12-20 MED ORDER — SODIUM CHLORIDE 0.9 % IV SOLN
200.0000 mg | Freq: Once | INTRAVENOUS | Status: AC
  Administered 2019-12-20: 200 mg via INTRAVENOUS
  Filled 2019-12-20: qty 200

## 2019-12-20 NOTE — Patient Instructions (Signed)

## 2019-12-27 ENCOUNTER — Ambulatory Visit: Payer: 59 | Admitting: Psychology

## 2019-12-31 ENCOUNTER — Encounter: Payer: Self-pay | Admitting: Family Medicine

## 2019-12-31 ENCOUNTER — Other Ambulatory Visit: Payer: Self-pay

## 2019-12-31 ENCOUNTER — Ambulatory Visit (INDEPENDENT_AMBULATORY_CARE_PROVIDER_SITE_OTHER): Payer: 59 | Admitting: Family Medicine

## 2019-12-31 VITALS — BP 140/90 | HR 64 | Temp 97.6°F | Ht 65.0 in | Wt 245.2 lb

## 2019-12-31 DIAGNOSIS — E782 Mixed hyperlipidemia: Secondary | ICD-10-CM | POA: Diagnosis not present

## 2019-12-31 DIAGNOSIS — R030 Elevated blood-pressure reading, without diagnosis of hypertension: Secondary | ICD-10-CM

## 2019-12-31 DIAGNOSIS — Z0000 Encounter for general adult medical examination without abnormal findings: Secondary | ICD-10-CM | POA: Diagnosis not present

## 2019-12-31 DIAGNOSIS — R7303 Prediabetes: Secondary | ICD-10-CM

## 2019-12-31 NOTE — Patient Instructions (Addendum)
Continue taking care of all your health issues  Send me a MyChart message with home readings in 1 month.    Your blood pressure high.   To check your blood pressure 1) Sit in a quiet and relaxed place for 5 minutes 2) Make sure your feet are flat on the ground 3) Consider checking first thing in the morning   Normal blood pressure is less than 140/90 Ideally you blood pressure should be around 120/80   Other ways you can reduce your blood pressure:  1) Regular exercise -- Try to get 150 minutes (30 minutes, 5 days a week) of moderate to vigorous aerobic excercise -- Examples: brisk walking (2.5 miles per hour), water aerobics, dancing, gardening, tennis, biking slower than 10 miles per hour 2) DASH Diet - low fat meats, more fresh fruits and vegetables, whole grains, low salt 3) Quit smoking if you smoke 4) Loose 5-10% of your body weight

## 2019-12-31 NOTE — Progress Notes (Signed)
Annual Exam   Chief Complaint:  Chief Complaint  Patient presents with  . Annual Exam    History of Present Illness:  [name] is a 49 y.o. [phone] who LMP was Patient's last menstrual period was 09/07/2009., presents today for her annual examination.    Rheumatologist - does not think it is RA Neurology - planning effexor  Nutrition Diet: getting better Exercise: not currently She does not get adequate calcium and Vitamin D in her diet.   Social History   Tobacco Use  Smoking Status Never Smoker  Smokeless Tobacco Never Used   Social History   Substance and Sexual Activity  Alcohol Use Yes  . Alcohol/week: 0.0 standard drinks   Comment: occassional- once a month has about 2-3 drinks   Social History   Substance and Sexual Activity  Drug Use No    Safety  The patient wears seatbelts: yes.     The patient feels safe at home and in their relationships: yes.  General Health Dentist in the last year: Yes Eye doctor: yes  Menstrual S/p hysterectomy No menopause symptom  GYN She is not sexually active.     Cervical Cancer Screening:   Last Pap:   May 2020 Results were: no abnormalities /neg HPV DNA  Hx of abnormal paps, but last year was normal Gets with GYN   Breast Cancer Screening There is no FH of breast cancer. There is no FH of ovarian cancer. BRCA screening Not Indicated.  Discussed that for average risk women between age 72-49 screening may reduce the risk of breast cancer death, however, at a lower rate than those over age 81. And that the the false-positive rates resulting in unnecessary biopsies with more screening is higher. The balance of benefits vs harms likely improves as you progress through your 40s. The patient does not want a mammogram this year.   Colon Cancer Screening:  Age 37-75 yo - benefits outweigh the risk. Adults 71-85 yo who have never been screened benefit.  Benefits: [id number] people in 2016 will be diagnosed and  49,000 will die - early detection helps Harms: Complications 2/2 to colonoscopy High Risk (Colonoscopy): genetic disorder (Lynch syndrome or familial adenomatous polyposis), personal hx of IBD, previous adenomatous polyp, or previous colorectal cancer, FamHx start 10 years before the age at diagnosis, increased in males and black race  Options:  FIT - looks for hemoglobin (blood in the stool) - specific and fairly sensitive - must be done annually Cologuard - looks for DNA and blood - more sensitive - therefore can have more false positives, every 3 years Colonoscopy - every 10 years if normal - sedation, bowl prep, must have someone drive you  Shared decision making and the patient had decided to do up to date on colonoscopy - due in 3 years from now.  Weight Wt Readings from Last 3 Encounters:  12/31/19 245 lb 4 oz (111.2 kg)  12/11/19 245 lb (111.1 kg)  11/06/19 245 lb 1.9 oz (111.2 kg)   Patient has very high BMI  BMI Readings from Last 1 Encounters:  12/31/19 40.81 kg/m     Chronic disease screening Blood pressure monitoring:  BP Readings from Last 3 Encounters:  12/31/19 140/90  12/20/19 (!) 153/90  12/11/19 (!) 142/100    Lipid Monitoring: Indication for screening: age >94, obesity, diabetes, family hx, CV risk factors.  Lipid screening: Yes  Lab Results  Component Value Date   CHOL 230 (H) 12/31/2019   HDL  45 12/31/2019   LDLCALC 143 (H) 12/31/2019   TRIG 231 (H) 12/31/2019   CHOLHDL 5.1 (H) 12/31/2019     Diabetes Screening: age >79, overweight, family hx, PCOS, hx of gestational diabetes, at risk ethnicity Diabetes Screening screening: Yes  Lab Results  Component Value Date   HGBA1C 6.4 (H) 12/31/2019     Past Medical History:  Diagnosis Date  . Anemia   . Anxiety   . Colon polyp   . Depression   . GERD (gastroesophageal reflux disease)   . Headache    migraines   . History of hysterectomy    still has both ovaries  . History of migraine  headaches   . PONV (postoperative nausea and vomiting)     Past Surgical History:  Procedure Laterality Date  . ABDOMINAL HYSTERECTOMY    . COLONOSCOPY    . KNEE ARTHROPLASTY    . KNEE ARTHROSCOPY    . knee athroscopy  5/12   LT  . LAPAROSCOPIC VAGINAL HYSTERECTOMY  5/11   Supra cervical  . left partial knee replacement    . PARTIAL KNEE ARTHROPLASTY Left 02/10/2014   Procedure: LEFT MEDIAL UNICOMPARTMENTAL KNEE ARTHROPLASTY;  Surgeon: Gearlean Alf, MD;  Location: [facility];  Service: Orthopedics;  Laterality: Left;  . UPPER GI ENDOSCOPY    . [place]    Prior to Admission medications   Medication Sig Start Date End Date Taking? Authorizing Provider  ALPRAZolam (XANAX) 0.25 MG tablet Take 1 tablet (0.25 mg total) by mouth daily as needed for anxiety or sleep. 10/10/19  Yes [name], MD  citalopram (CELEXA) 40 MG tablet Take 1 tablet (40 mg total) by mouth daily. 03/28/19  Yes Lucille Passy, MD  cyclobenzaprine (FLEXERIL) 5 MG tablet Take 1 tablet (5 mg total) by mouth 3 (three) times daily as needed for muscle spasms. 03/28/19  Yes Lucille Passy, MD  nortriptyline (PAMELOR) 10 MG capsule Start Nortriptyline (Pamelor) 10 mg nightly for one week, then increase to 20 mg nightly 10/30/19  Yes [provider]  SUMAtriptan (IMITREX) 50 MG tablet TAKE 1 TABLET MAY REPEAT IN 2 HOURS IF HEADACHE PERSISTS OR RECURS. DON'T USE MORE THAN TWO IN 24HRS 12/11/19  Yes [name], MD  traMADol Veatrice Bourbon) 50 MG tablet Take 1-2q6h prn 12/17/19  Yes [name], MD    Allergies  Allergen Reactions  . Ibuprofen Swelling    Bilateral swelling of ankles and feet  . Diflucan [Fluconazole] Other (See Comments)    Pelvic pain  . Onion Other (See Comments)    migraines   . Topamax [Topiramate] Other (See Comments)    Reaction:  Body aches    . Hydrocodone Anxiety, Palpitations and Other (See Comments)    Reaction:  Migraines     Gynecologic History:  Patient's last menstrual period was 09/07/2009.  Obstetric History: [id number]  Social History   Socioeconomic History  . Marital status: Single    Spouse name: Not on file  . Number of children: 1  . Years of education: Not on file  . Highest education level: Not on file  Occupational History  . 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 Smoking status: Never Smoker  . Smokeless tobacco: Never Used  Vaping Use  . Vaping Use: Never used  Substance and Sexual Activity  . Alcohol use: Yes    Alcohol/week: 0.0 standard drinks    Comment: occassional- once a month has about 2-3 drinks  .  Drug use: No  . Sexual activity: Never  Other Topics Concern  . Not on file  Social History Narrative  . Not on file   Social Determinants of Health   Financial Resource Strain:   . Difficulty of Paying Living Expenses: Not on file  Food Insecurity:   . Worried About Charity fundraiser in the Last Year: Not on file  . Ran Out of Food in the Last Year: Not on file  Transportation Needs:   . Lack of Transportation (Medical): Not on file  . Lack of Transportation (Non-Medical): Not on file  Physical Activity:   . Days of Exercise per Week: Not on file  . Minutes of Exercise per Session: Not on file  Stress:   . Feeling of Stress : Not on file  Social Connections:   . Frequency of Communication with Friends and Family: Not on file  . Frequency of Social Gatherings with Friends and Family: Not on file  . Attends Religious Services: Not on file  . Active Member of Clubs or Organizations: Not on file  . Attends Archivist Meetings: Not on file  . Marital Status: Not on file  Intimate Partner Violence:   . Fear of Current or Ex-Partner: Not on file  . Emotionally Abused: Not on file  . Physically Abused: Not on file  . Sexually Abused: Not on file    Family History  Problem Relation Age of Onset  . Stroke Father   . Heart attack Father   . Hyperlipidemia Mother    . Hypertension Mother   . Breast cancer Maternal Aunt   . Colon cancer Neg Hx   . Colon polyps Neg Hx   . Kidney disease Neg Hx   . Gallbladder disease Neg Hx   . Esophageal cancer Neg Hx   . Alcohol abuse Neg Hx   . Drug abuse Neg Hx   . Depression Neg Hx   . Bipolar disorder Neg Hx   . Anxiety disorder Neg Hx   . Schizophrenia Neg Hx   . Suicidality Neg Hx     Review of Systems  Constitutional: Negative for chills and fever.  HENT: Negative for congestion and sore throat.   Eyes: Negative for blurred vision and double vision.  Respiratory: Negative for shortness of breath.   Cardiovascular: Negative for chest pain.  Gastrointestinal: Negative for heartburn, nausea and vomiting.  Genitourinary: Negative.   Musculoskeletal: Negative.  Negative for myalgias.  Skin: Negative for rash.  Neurological: Negative for dizziness and headaches.  Endo/Heme/Allergies: Does not bruise/bleed easily.  Psychiatric/Behavioral: Negative for depression. The patient is not nervous/anxious.      Physical Exam BP 140/90   Pulse 64   Temp 97.6 F (36.4 C) (Temporal)   Ht _0  (1.651 m)   Wt 245 lb 4 oz (111.2 kg)   LMP 09/07/2009   SpO2 97%   BMI 40.81 kg/m    BP Readings from Last 3 Encounters:  12/31/19 140/90  12/20/19 (!) 153/90  12/11/19 (!) 142/100      Physical Exam Constitutional:      General: She is not in acute distress.    Appearance: She is well-developed. She is obese. She is not diaphoretic.  HENT:     Head: Normocephalic and atraumatic.     Right Ear: External ear normal.     Left Ear: External ear normal.     Nose: Nose normal.  Eyes:     General: No scleral  icterus.    Conjunctiva/sclera: Conjunctivae normal.  Cardiovascular:     Rate and Rhythm: Normal rate and regular rhythm.     Heart sounds: No murmur heard.   Pulmonary:     Effort: Pulmonary effort is normal. No respiratory distress.     Breath sounds: Normal breath sounds. No wheezing.   Abdominal:     General: Bowel sounds are normal. There is no distension.     Palpations: Abdomen is soft. There is no mass.     Tenderness: There is no abdominal tenderness. There is no guarding or rebound.  Musculoskeletal:        General: Normal range of motion.     Cervical back: Neck supple.  Lymphadenopathy:     Cervical: No cervical adenopathy.  Skin:    General: Skin is warm and dry.     Capillary Refill: Capillary refill takes less than 2 seconds.  Neurological:     Mental Status: She is alert and oriented to person, place, and time.     Deep Tendon Reflexes: Reflexes normal.  Psychiatric:        Behavior: Behavior normal.      Results:  PHQ-9:    Office Visit from 12/31/2019 in [place] at [facility] Total Score 8        Assessment: 49 y.o. [phone] female here for routine annual physical examination.  Plan: Problem List Items Addressed This Visit    None    Visit Diagnoses    Annual physical exam    -  Primary   Relevant Orders   Magnesium (Completed)   ABO AND RH  (Completed)   Need for influenza vaccination       Prediabetes       Relevant Orders   Hemoglobin A1c (Completed)   Mixed hyperlipidemia       Relevant Orders   Lipid panel (Completed)   Elevated blood pressure reading       Relevant Orders   Comprehensive metabolic panel (Completed)      Screening: -- Blood pressure screen elevated: continued to monitor. -- cholesterol screening: will obtain -- Weight screening: obese: discussed management options, including lifestyle, dietary, and exercise. -- Diabetes Screening: will obtain -- Nutrition: Encouraged healthy diet  The 10-year ASCVD risk score Mikey Bussing DC Jr., et al., 2013) is: 2%   Values used to calculate the score:     Age: 65 years     Sex: Female     Is Non-Hispanic African American: No     Diabetic: No     Tobacco smoker: No     Systolic Blood Pressure: 235 mmHg     Is BP treated: No     HDL Cholesterol:  45 mg/dL     Total Cholesterol: 230 mg/dL  -- Statin therapy for Age 70-75 with CVD risk >7.5%  Psych -- Depression screening (PHQ-9):    Office Visit from 12/31/2019 in [place] at [facility]  PHQ-9 Total Score 8       Safety -- tobacco screening: not using -- alcohol screening: low-risk usage. -- no evidence of domestic violence or intimate partner violence.   Cancer Screening -- pap smear not collected per ASCCP guidelines -- family history of breast cancer screening: done. not at high risk. -- Mammogram - discussed risk/benefit for her age, she will wait until age 46 -- Colon cancer (age 55+)-- up to date  Immunizations Immunization History  Administered Date(s) Administered  . Influenza,inj,Quad PF,6+ Mos 01/02/2015,  01/29/2016, 01/17/2017, 01/10/2018, 01/17/2019  . PFIZER SARS-COV-2 Vaccination 07/19/2019, 08/12/2019    -- flu vaccine up to date -- TDAP q10 years up to date -- Covid-19 Vaccine up to date   Encouraged healthy diet and exercise. Encouraged regular vision and dental care.   [name], MD

## 2020-01-01 ENCOUNTER — Encounter: Payer: Self-pay | Admitting: Family Medicine

## 2020-01-01 DIAGNOSIS — R7303 Prediabetes: Secondary | ICD-10-CM

## 2020-01-01 LAB — HEMOGLOBIN A1C
Est. average glucose Bld gHb Est-mCnc: 137 mg/dL
Hgb A1c MFr Bld: 6.4 % — ABNORMAL HIGH (ref 4.8–5.6)

## 2020-01-01 LAB — COMPREHENSIVE METABOLIC PANEL
ALT: 109 IU/L — ABNORMAL HIGH (ref 0–32)
AST: 77 IU/L — ABNORMAL HIGH (ref 0–40)
Albumin/Globulin Ratio: 1.8 (ref 1.2–2.2)
Albumin: 4.4 g/dL (ref 3.8–4.8)
Alkaline Phosphatase: 123 IU/L — ABNORMAL HIGH (ref 44–121)
BUN/Creatinine Ratio: 12 (ref 9–23)
BUN: 11 mg/dL (ref 6–24)
Bilirubin Total: 0.3 mg/dL (ref 0.0–1.2)
CO2: 26 mmol/L (ref 20–29)
Calcium: 9.8 mg/dL (ref 8.7–10.2)
Chloride: 96 mmol/L (ref 96–106)
Creatinine, Ser: 0.89 mg/dL (ref 0.57–1.00)
GFR calc Af Amer: 89 mL/min/{1.73_m2} (ref >59)
GFR calc non Af Amer: 77 mL/min/{1.73_m2} (ref >59)
Globulin, Total: 2.5 g/dL (ref 1.5–4.5)
Glucose: 103 mg/dL — ABNORMAL HIGH (ref 65–99)
Potassium: 4.8 mmol/L (ref 3.5–5.2)
Sodium: 135 mmol/L (ref 134–144)
Total Protein: 6.9 g/dL (ref 6.0–8.5)

## 2020-01-01 LAB — LIPID PANEL
Chol/HDL Ratio: 5.1 ratio — ABNORMAL HIGH (ref 0.0–4.4)
Cholesterol, Total: 230 mg/dL — ABNORMAL HIGH (ref 100–199)
HDL: 45 mg/dL (ref >39)
LDL Chol Calc (NIH): 143 mg/dL — ABNORMAL HIGH (ref 0–99)
Triglycerides: 231 mg/dL — ABNORMAL HIGH (ref 0–149)
VLDL Cholesterol Cal: 42 mg/dL — ABNORMAL HIGH (ref 5–40)

## 2020-01-01 LAB — ABO AND RH: Rh Factor: POSITIVE

## 2020-01-01 LAB — MAGNESIUM: Magnesium: 2 mg/dL (ref 1.6–2.3)

## 2020-01-02 MED ORDER — METFORMIN HCL 500 MG PO TABS: 500.0000 mg | ORAL_TABLET | Freq: Every day | ORAL | 3 refills | Status: DC

## 2020-01-03 ENCOUNTER — Inpatient Hospital Stay: Payer: 59

## 2020-01-03 ENCOUNTER — Other Ambulatory Visit: Payer: Self-pay

## 2020-01-03 VITALS — BP 132/84 | HR 86 | Temp 98.5°F | Resp 17

## 2020-01-03 DIAGNOSIS — D508 Other iron deficiency anemias: Secondary | ICD-10-CM | POA: Diagnosis not present

## 2020-01-03 MED ORDER — SODIUM CHLORIDE 0.9 % IV SOLN
200.0000 mg | Freq: Once | INTRAVENOUS | Status: AC
  Administered 2020-01-03: 200 mg via INTRAVENOUS
  Filled 2020-01-03: qty 200

## 2020-01-03 MED ORDER — DEXTROSE-NACL 5-0.45 % IV SOLN: INTRAVENOUS | Status: DC

## 2020-01-03 MED ORDER — SODIUM CHLORIDE 0.9 % IV SOLN
INTRAVENOUS | Status: DC
  Filled 2020-01-03: qty 250

## 2020-01-03 NOTE — Patient Instructions (Signed)
Iron Sucrose injection (Venofer)  What is this medicine? IRON SUCROSE (AHY ern SOO krohs) is an iron complex. Iron is used to make healthy red blood cells, which carry oxygen and nutrients throughout the body. This medicine is used to treat iron deficiency anemia in people with chronic kidney disease. This medicine may be used for other purposes; ask your health care provider or pharmacist if you have questions. COMMON BRAND NAME(S): Venofer What should I tell my health care provider before I take this medicine? They need to know if you have any of these conditions:  anemia not caused by low iron levels  heart disease  high levels of iron in the blood  kidney disease  liver disease  an unusual or allergic reaction to iron, other medicines, foods, dyes, or preservatives  pregnant or trying to get pregnant  breast-feeding How should I use this medicine? This medicine is for infusion into a vein. It is given by a health care professional in a hospital or clinic setting. Talk to your pediatrician regarding the use of this medicine in children. While this drug may be prescribed for children as young as 2 years for selected conditions, precautions do apply. Overdosage: If you think you have taken too much of this medicine contact a poison control center or emergency room at once. NOTE: This medicine is only for you. Do not share this medicine with others. What if I miss a dose? It is important not to miss your dose. Call your doctor or health care professional if you are unable to keep an appointment. What may interact with this medicine? Do not take this medicine with any of the following medications:  deferoxamine  dimercaprol  other iron products This medicine may also interact with the following medications:  chloramphenicol  deferasirox This list may not describe all possible interactions. Give your health care provider a list of all the medicines, herbs, non-prescription  drugs, or dietary supplements you use. Also tell them if you smoke, drink alcohol, or use illegal drugs. Some items may interact with your medicine. What should I watch for while using this medicine? Visit your doctor or healthcare professional regularly. Tell your doctor or healthcare professional if your symptoms do not start to get better or if they get worse. You may need blood work done while you are taking this medicine. You may need to follow a special diet. Talk to your doctor. Foods that contain iron include: whole grains/cereals, dried fruits, beans, or peas, leafy green vegetables, and organ meats (liver, kidney). What side effects may I notice from receiving this medicine? Side effects that you should report to your doctor or health care professional as soon as possible:  allergic reactions like skin rash, itching or hives, swelling of the face, lips, or tongue  breathing problems  changes in blood pressure  cough  fast, irregular heartbeat  feeling faint or lightheaded, falls  fever or chills  flushing, sweating, or hot feelings  joint or muscle aches/pains  seizures  swelling of the ankles or feet  unusually weak or tired Side effects that usually do not require medical attention (report to your doctor or health care professional if they continue or are bothersome):  diarrhea  feeling achy  headache  irritation at site where injected  nausea, vomiting  stomach upset  tiredness This list may not describe all possible side effects. Call your doctor for medical advice about side effects. You may report side effects to FDA at 1-800-FDA-1088. Where should   I keep my medicine? This drug is given in a hospital or clinic and will not be stored at home. NOTE: This sheet is a summary. It may not cover all possible information. If you have questions about this medicine, talk to your doctor, pharmacist, or health care provider.  2020 Elsevier/Gold Standard  (2011-01-13 17:14:35)  

## 2020-01-10 ENCOUNTER — Ambulatory Visit (INDEPENDENT_AMBULATORY_CARE_PROVIDER_SITE_OTHER): Payer: 59 | Admitting: Psychology

## 2020-01-10 DIAGNOSIS — F410 Panic disorder [episodic paroxysmal anxiety] without agoraphobia: Secondary | ICD-10-CM

## 2020-01-24 ENCOUNTER — Ambulatory Visit (INDEPENDENT_AMBULATORY_CARE_PROVIDER_SITE_OTHER): Payer: 59 | Admitting: Psychology

## 2020-01-24 DIAGNOSIS — F410 Panic disorder [episodic paroxysmal anxiety] without agoraphobia: Secondary | ICD-10-CM

## 2020-02-04 ENCOUNTER — Encounter: Payer: Self-pay | Admitting: Family Medicine

## 2020-02-05 DIAGNOSIS — M25561 Pain in right knee: Secondary | ICD-10-CM | POA: Insufficient documentation

## 2020-02-07 ENCOUNTER — Ambulatory Visit
Admission: RE | Admit: 2020-02-07 | Discharge: 2020-02-07 | Disposition: A | Discharge status: Home / Self Care | Payer: 59 | Source: Ambulatory Visit | Attending: Family Medicine | Admitting: Family Medicine

## 2020-02-07 ENCOUNTER — Other Ambulatory Visit: Payer: Self-pay

## 2020-02-07 ENCOUNTER — Ambulatory Visit (INDEPENDENT_AMBULATORY_CARE_PROVIDER_SITE_OTHER): Payer: 59 | Admitting: Psychology

## 2020-02-07 VITALS — BP 142/95 | HR 84 | Temp 98.2°F | Resp 14 | Ht 64.5 in | Wt 240.0 lb

## 2020-02-07 DIAGNOSIS — F410 Panic disorder [episodic paroxysmal anxiety] without agoraphobia: Secondary | ICD-10-CM | POA: Diagnosis not present

## 2020-02-07 DIAGNOSIS — N644 Mastodynia: Secondary | ICD-10-CM | POA: Diagnosis not present

## 2020-02-07 DIAGNOSIS — N63 Unspecified lump in unspecified breast: Secondary | ICD-10-CM

## 2020-02-07 MED ORDER — TRAMADOL HCL 50 MG PO TABS: 50.0000 mg | ORAL_TABLET | Freq: Four times a day (QID) | ORAL | 0 refills | Status: DC | PRN

## 2020-02-07 NOTE — Discharge Instructions (Addendum)
I am giving you the number for Dr. Lysle Pearl, he is a breast specialist.  Call and make an appointment for evaluation.  Use the tramadol as needed for pain.

## 2020-02-07 NOTE — ED Provider Notes (Signed)
[facility]    CSN: [id number] Arrival date & time: 02/07/20  1149      History   Chief Complaint Chief Complaint  Patient presents with  . Breast Pain    right  . Appointment    HPI Patricia Bauer is a 49 y.o. female.   49 year old female presents for evaluation of severe pain in right breast and a lump that started yesterday.  She reports the pain came on first and then she noticed the lump.  She says the lump is near her areola and the pain is more on the right side of the breast.  She has had a previous cyst in her breast on the right that had to be aspirated near the same area.  This was 5 to 6 years ago and was done at the [facility] surgical center.  She says that she has not noticed any change in size of the lump. No skin changes. She also has not had any discharge from the nipple or changes in symmetry.  Patient reports that she feels fatigued all the time, she has had soaking night sweats and had to change her pajamas.  She denies weight loss, easy bruising, or rashes.     Past Medical History:  Diagnosis Date  . Anemia   . Anxiety   . Colon polyp   . Depression   . GERD (gastroesophageal reflux disease)   . Headache    migraines   . History of hysterectomy    still has both ovaries  . History of migraine headaches   . PONV (postoperative nausea and vomiting)     Patient Active Problem List   Diagnosis Date Noted  . Decreased sensation 10/10/2019  . Freckles 10/10/2019  . Back injury 03/27/2019  . Fatigue 01/15/2019  . Myalgia 04/23/2018  . IDA (iron deficiency anemia) 02/16/2018  . Vitamin D deficiency 01/22/2018  . Right foot pain 01/22/2018  . History of partial knee replacement left 07/14/2017  . De Quervain's tenosynovitis, right 03/02/2017  . Metatarsalgia, right foot 03/02/2017  . LLQ pain 06/03/2015  . Major depressive disorder, recurrent, severe without psychotic features ([place]) 01/15/2015  . GAD (generalized anxiety disorder)  01/15/2015  . Panic disorder without agoraphobia 01/15/2015  . Arthralgia 11/12/2014  . Hemorrhoid prolapse 11/09/2014  . Morbid obesity ([place]) 09/08/2014  . OA (osteoarthritis) of knee 02/10/2014  . Personal history of colonic polyps 10/16/2012  . History of headache 03/08/2012  . History of hysterectomy, supracervical 10/23/2011  . Anemia 09/08/2011  . GERD (gastroesophageal reflux disease) 09/08/2011  . Anxiety 09/08/2011  . Migraine headache 09/08/2011  . H/O: hysterectomy 09/08/2011    Past Surgical History:  Procedure Laterality Date  . ABDOMINAL HYSTERECTOMY    . COLONOSCOPY    . KNEE ARTHROPLASTY    . KNEE ARTHROSCOPY    . knee athroscopy  5/12   LT  . LAPAROSCOPIC VAGINAL HYSTERECTOMY  5/11   Supra cervical  . left partial knee replacement    . PARTIAL KNEE ARTHROPLASTY Left 02/10/2014   Procedure: LEFT MEDIAL UNICOMPARTMENTAL KNEE ARTHROPLASTY;  Surgeon: Gearlean Alf, MD;  Location: [facility];  Service: Orthopedics;  Laterality: Left;  . UPPER GI ENDOSCOPY    . WISDOM TOOTH EXTRACTION  1995    OB History    Gravida  4   Para  1   Term  1   Preterm      AB  2   Living  1  SAB  2   TAB      Ectopic      Multiple      Live Births               Home Medications    Prior to Admission medications   Medication Sig Start Date End Date Taking? Authorizing Provider  ALPRAZolam (XANAX) 0.25 MG tablet Take 1 tablet (0.25 mg total) by mouth daily as needed for anxiety or sleep. 10/10/19  Yes [name], MD  citalopram (CELEXA) 40 MG tablet Take 1 tablet (40 mg total) by mouth daily. 03/28/19  Yes Lucille Passy, MD  metFORMIN (GLUCOPHAGE) 500 MG tablet Take 1 tablet (500 mg total) by mouth daily with breakfast. 01/02/20  Yes [name], MD  SUMAtriptan (IMITREX) 50 MG tablet TAKE 1 TABLET MAY REPEAT IN 2 HOURS IF HEADACHE PERSISTS OR RECURS. DON'T USE MORE THAN TWO IN 24HRS 12/11/19  Yes [name], MD  traMADol Veatrice Bourbon) 50 MG tablet  Take 1-2q6h prn 12/17/19  Yes [name], MD  cyclobenzaprine (FLEXERIL) 5 MG tablet Take 1 tablet (5 mg total) by mouth 3 (three) times daily as needed for muscle spasms. 03/28/19   Lucille Passy, MD  traMADol (ULTRAM) 50 MG tablet Take 1 tablet (50 mg total) by mouth every 6 (six) hours as needed. 02/07/20   Margarette Canada, NP  nortriptyline (PAMELOR) 10 MG capsule Start Nortriptyline (Pamelor) 10 mg nightly for one week, then increase to 20 mg nightly 10/30/19 02/07/20  [provider]    Family History Family History  Problem Relation Age of Onset  . Stroke Father   . Heart attack Father   . Hyperlipidemia Mother   . Hypertension Mother   . Breast cancer Maternal Aunt   . Colon cancer Neg Hx   . Colon polyps Neg Hx   . Kidney disease Neg Hx   . Gallbladder disease Neg Hx   . Esophageal cancer Neg Hx   . Alcohol abuse Neg Hx   . Drug abuse Neg Hx   . Depression Neg Hx   . Bipolar disorder Neg Hx   . Anxiety disorder Neg Hx   . Schizophrenia Neg Hx   . Suicidality Neg Hx     Social History Social History   Tobacco Use  . Smoking status: Never Smoker  . Smokeless tobacco: Never Used  Vaping Use  . Vaping Use: Never used  Substance Use Topics  . Alcohol use: Yes    Alcohol/week: 0.0 standard drinks    Comment: occassional- once a month has about 2-3 drinks  . Drug use: No     Allergies   Ibuprofen, Diflucan [fluconazole], Onion, Topamax [topiramate], and Hydrocodone   Review of Systems Review of Systems  Constitutional: Positive for diaphoresis and fatigue. Negative for activity change and appetite change.       Fatigue is always present.  She has sweats at night.  HENT: Negative for congestion.   Respiratory: Negative for cough, shortness of breath and wheezing.   Cardiovascular: Negative for chest pain.  Gastrointestinal: Negative for vomiting.  Genitourinary: Negative for dysuria and frequency.  Musculoskeletal: Negative for arthralgias and  myalgias.       Patient found a lump in her right breast last night after onset of severe pain.  Skin: Negative for color change and rash.  Neurological: Negative for dizziness and syncope.  Hematological: Negative.   Psychiatric/Behavioral: Negative.      Physical Exam Triage  Vital Signs ED Triage Vitals  Enc Vitals Group     BP 02/07/20 1227 (!) 142/95     Pulse Rate 02/07/20 1227 84     Resp 02/07/20 1227 14     Temp 02/07/20 1227 98.2 F (36.8 C)     Temp Source 02/07/20 1227 Oral     SpO2 02/07/20 1227 95 %     Weight 02/07/20 1224 240 lb (108.9 kg)     Height 02/07/20 1224 5' 4.5" (1.638 m)     Head Circumference --      Peak Flow --      Pain Score 02/07/20 1224 5     Pain Loc --      Pain Edu? --      Excl. in [place]? --    No data found.  Updated Vital Signs BP (!) 142/95 (BP Location: Left Arm)   Pulse 84   Temp 98.2 F (36.8 C) (Oral)   Resp 14   Ht 5' 4.5" (1.638 m)   Wt 240 lb (108.9 kg)   LMP 09/07/2009   SpO2 95%   BMI 40.56 kg/m   Visual Acuity Right Eye Distance:   Left Eye Distance:   Bilateral Distance:    Right Eye Near:   Left Eye Near:    Bilateral Near:     Physical Exam Vitals and nursing note reviewed. Exam conducted with a chaperone present Nira Conn, RN).  Constitutional:      General: She is not in acute distress.    Appearance: Normal appearance. She is obese. She is not ill-appearing.  HENT:     Head: Normocephalic and atraumatic.  Eyes:     General: No scleral icterus.    Extraocular Movements: Extraocular movements intact.     Conjunctiva/sclera: Conjunctivae normal.     Pupils: Pupils are equal, round, and reactive to light.  Cardiovascular:     Rate and Rhythm: Normal rate and regular rhythm.     Pulses: Normal pulses.     Heart sounds: Normal heart sounds. No murmur heard.  No gallop.   Pulmonary:     Effort: Pulmonary effort is normal.     Breath sounds: Normal breath sounds. No wheezing, rhonchi or rales.    Musculoskeletal:        General: Tenderness present. Normal range of motion.     Cervical back: Normal range of motion and neck supple.     Comments: Patient has an egg sized lump and the right breast behind the areola that extends from approximately the 7 to the 10 o'clock position.  The lateral aspect of the breast is tender to palpation as is the anterior chest wall on the right side.  No axillary lymph nodes appreciated on exam.  There is no erythema to the breast.  No nipple discharge.  Skin:    General: Skin is warm and dry.     Capillary Refill: Capillary refill takes less than 2 seconds.     Findings: No erythema.  Neurological:     General: No focal deficit present.     Mental Status: She is alert and oriented to person, place, and time.  Psychiatric:        Mood and Affect: Mood normal.        Behavior: Behavior normal.        Thought Content: Thought content normal.        Judgment: Judgment normal.      UC Treatments / Results  Labs (all  labs ordered are listed, but only abnormal results are displayed) Labs Reviewed - No data to display  EKG   Radiology No results found.  Procedures Procedures (including critical care time)  Medications Ordered in UC Medications - No data to display  Initial Impression / Assessment and Plan / UC Course  I have reviewed the triage vital signs and the nursing notes.  Pertinent labs & imaging results that were available during my care of the patient were reviewed by me and considered in my medical decision making (see chart for details).   Is here for evaluation of severe pain in her right breast and a lump.  She said she noticed the lump after the pain started yesterday.  She has had a cyst in that breast previously drained at the [facility] surgical center.  The lump is about the size of an egg and it is on the right side of the nipple and the areola.  It is firm and tender to palpation.  The right side of the breast is also  tender to palpation as is the anterior chest wall on the right side.  There is no erythema or skin changes.  No nipple discharge.  No axillary lymph nodes palpated.  Last mammogram was June 14, 2016 and there was no evidence of malignancy.  Screening mammogram was recommended for 1 year later.  Patient has not been back for mammogram since.  Patient reports that she had a previous cyst aspirated on the right, but there is no note in epic.  Patient needs mammogram and evaluation by breast specialist.  Will refer to Dr. Lysle Pearl at [place] [facility].   Final Clinical Impressions(s) / UC Diagnoses   Final diagnoses:  Breast pain, right  Breast lump     Discharge Instructions     I am giving you the number for Dr. Lysle Pearl, he is a breast specialist.  Call and make an appointment for evaluation.  Use the tramadol as needed for pain.       ED Prescriptions    Medication Sig Dispense Auth. Provider   traMADol (ULTRAM) 50 MG tablet Take 1 tablet (50 mg total) by mouth every 6 (six) hours as needed. 15 tablet Margarette Canada, NP     I have reviewed the PDMP during this encounter.   Margarette Canada, NP 02/07/20 1326

## 2020-02-07 NOTE — ED Triage Notes (Signed)
Patient states that she noticed a lump in her right breast yesterday.  Patient states that she has had a breast cyst before.  Patient c/o ongoing pain in her right breast that started last night.  Patient denies redness or swelling Patient denies fevers.

## 2020-02-12 ENCOUNTER — Encounter: Payer: Self-pay | Admitting: Family Medicine

## 2020-02-21 ENCOUNTER — Ambulatory Visit (INDEPENDENT_AMBULATORY_CARE_PROVIDER_SITE_OTHER): Payer: 59 | Admitting: Psychology

## 2020-02-21 ENCOUNTER — Ambulatory Visit: Payer: 59 | Admitting: Psychology

## 2020-02-21 DIAGNOSIS — F410 Panic disorder [episodic paroxysmal anxiety] without agoraphobia: Secondary | ICD-10-CM

## 2020-03-05 ENCOUNTER — Telehealth: Payer: Self-pay

## 2020-03-05 ENCOUNTER — Other Ambulatory Visit: Payer: Self-pay

## 2020-03-05 ENCOUNTER — Telehealth (INDEPENDENT_AMBULATORY_CARE_PROVIDER_SITE_OTHER): Payer: 59 | Admitting: Internal Medicine

## 2020-03-05 ENCOUNTER — Encounter: Payer: Self-pay | Admitting: Internal Medicine

## 2020-03-05 DIAGNOSIS — J069 Acute upper respiratory infection, unspecified: Secondary | ICD-10-CM | POA: Diagnosis not present

## 2020-03-05 MED ORDER — GUAIFENESIN-CODEINE 100-10 MG/5ML PO SOLN: 10.0000 mL | Freq: Three times a day (TID) | ORAL | 0 refills | Status: DC | PRN

## 2020-03-05 NOTE — Progress Notes (Signed)
Subjective:    [name], female    DOB: [birth year], 49 y.o.   MRN: [id number]  HPI Video virtual visit due to respiratory symptoms Identification done Reviewed limitations and billing and she gave consent Participants---patient in her home and I am in my office  Started with a cold---got it from grandkids Sore throat was 6 days ago Seemed to be improving--but then worsened last night Some streaks of blood in sputum this morning Ongoing cough--can't stop No fever No headache No ear pain No history of sinus issues No SOB---other than with a coughing fit No PND  Current Outpatient Medications on File Prior to Visit  Medication Sig Dispense Refill  . ALPRAZolam (XANAX) 0.25 MG tablet Take 1 tablet (0.25 mg total) by mouth daily as needed for anxiety or sleep. 30 tablet 1  . citalopram (CELEXA) 40 MG tablet Take 1 tablet (40 mg total) by mouth daily. 90 tablet 3  . SUMAtriptan (IMITREX) 50 MG tablet TAKE 1 TABLET MAY REPEAT IN 2 HOURS IF HEADACHE PERSISTS OR RECURS. DON'T USE MORE THAN TWO IN 24HRS 90 tablet 2  . traMADol (ULTRAM) 50 MG tablet Take 1 tablet (50 mg total) by mouth every 6 (six) hours as needed. 15 tablet 0  . [DISCONTINUED] nortriptyline (PAMELOR) 10 MG capsule Start Nortriptyline (Pamelor) 10 mg nightly for one week, then increase to 20 mg nightly     No current facility-administered medications on file prior to visit.    Allergies  Allergen Reactions  . Ibuprofen Swelling    Bilateral swelling of ankles and feet  . Diflucan [Fluconazole] Other (See Comments)    Pelvic pain  . Onion Other (See Comments)    migraines   . Topamax [Topiramate] Other (See Comments)    Reaction:  Body aches    . Hydrocodone Anxiety, Palpitations and Other (See Comments)    Reaction:  Migraines     Past Medical History:  Diagnosis Date  . Anemia   . Anxiety   . Colon polyp   . Depression   . GERD (gastroesophageal reflux disease)   . Headache     migraines   . History of hysterectomy    still has both ovaries  . History of migraine headaches   . PONV (postoperative nausea and vomiting)     Past Surgical History:  Procedure Laterality Date  . ABDOMINAL HYSTERECTOMY    . COLONOSCOPY    . KNEE ARTHROPLASTY    . KNEE ARTHROSCOPY    . knee athroscopy  5/12   LT  . LAPAROSCOPIC VAGINAL HYSTERECTOMY  5/11   Supra cervical  . left partial knee replacement    . PARTIAL KNEE ARTHROPLASTY Left 02/10/2014   Procedure: LEFT MEDIAL UNICOMPARTMENTAL KNEE ARTHROPLASTY;  Surgeon: Gearlean Alf, MD;  Location: [facility];  Service: Orthopedics;  Laterality: Left;  . UPPER GI ENDOSCOPY    . WISDOM TOOTH EXTRACTION  1995    Family History  Problem Relation Age of Onset  . Stroke Father   . Heart attack Father   . Hyperlipidemia Mother   . Hypertension Mother   . Breast cancer Maternal Aunt   . Colon cancer Neg Hx   . Colon polyps Neg Hx   . Kidney disease Neg Hx   . Gallbladder disease Neg Hx   . Esophageal cancer Neg Hx   . Alcohol abuse Neg Hx   . Drug abuse Neg Hx   . Depression Neg Hx   . Bipolar  disorder Neg Hx   . Anxiety disorder Neg Hx   . Schizophrenia Neg Hx   . Suicidality Neg Hx     Social History   Socioeconomic History  . Marital status: Single    Spouse name: Not on file  . Number of children: 1  . Years of education: Not on file  . Highest education level: Not on file  Occupational History  . 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 Smoking status: Never Smoker  . Smokeless tobacco: Never Used  Vaping Use  . Vaping Use: Never used  Substance and Sexual Activity  . Alcohol use: Yes    Alcohol/week: 0.0 standard drinks    Comment: occassional- once a month has about 2-3 drinks  . Drug use: No  . Sexual activity: Never  Other Topics Concern  . Not on file  Social History Narrative  . Not on file   Social Determinants of Health   Financial Resource Strain:   . Difficulty of Paying  Living Expenses: Not on file  Food Insecurity:   . Worried About Charity fundraiser in the Last Year: Not on file  . Ran Out of Food in the Last Year: Not on file  Transportation Needs:   . Lack of Transportation (Medical): Not on file  . Lack of Transportation (Non-Medical): Not on file  Physical Activity:   . Days of Exercise per Week: Not on file  . Minutes of Exercise per Session: Not on file  Stress:   . Feeling of Stress : Not on file  Social Connections:   . Frequency of Communication with Friends and Family: Not on file  . Frequency of Social Gatherings with Friends and Family: Not on file  . Attends Religious Services: Not on file  . Active Member of Clubs or Organizations: Not on file  . Attends Archivist Meetings: Not on file  . Marital Status: Not on file  Intimate Partner Violence:   . Fear of Current or Ex-Partner: Not on file  . Emotionally Abused: Not on file  . Physically Abused: Not on file  . Sexually Abused: Not on file   Review of [employer] from [employer] Had COVID immunization--and no known exposure Daughter and 2 grandkids live with her No change in taste or smell No N/V---until this morning (2AM)----?after eating in middle of night     Objective:   Physical Exam Constitutional:      Comments: Looks mildly ill--no distress  Pulmonary:     Effort: Pulmonary effort is normal. No respiratory distress.  Neurological:     Mental Status: She is alert.            Assessment & Plan:

## 2020-03-05 NOTE — Telephone Encounter (Signed)
Will assess at the visit

## 2020-03-05 NOTE — Telephone Encounter (Signed)
[facility] - Client TELEPHONE ADVICE RECORD [facility] Patient Name: Patricia Bauer Gender: Female DOB: June 22, 1970 Age: 49 Y 31 M 9 D Return Phone Number: [phone] (Primary) Address: City/State/Zip: [place] [address] Client [facility] - Client Client Site [facility] - Day Physician Waunita Schooner- MD Contact Type Call Who Is Calling Patient / Member / Family / Caregiver Call Type Triage / Clinical Relationship To Patient Self Return Phone Number [phone] (Primary) Chief Complaint Coughing Up Blood Reason for Call Symptomatic / Request for [name] states she believes she has bronchitis and she is also coughing up some blood in her sputum. Translation No Nurse Assessment Nurse: Harvie Bridge, RN, Beth Date/Time (Eastern Time): 03/05/2020 8:36:32 AM Confirm and document reason for call. If symptomatic, describe symptoms. ---Caller states she believes she has bronchitis and she is also coughing up some blood in her sputum. Not every time she coughs it up. Vomited last night. Denies fever. Started last Friday. Does the patient have any new or worsening symptoms? ---Yes Will a triage be completed? ---Yes Related visit to physician within the last 2 weeks? ---No Does the PT have any chronic conditions? (i.e. diabetes, asthma, this includes High risk factors for pregnancy, etc.) ---No Is the patient pregnant or possibly pregnant? (Ask all females between the ages of 4-55) ---No Is this a behavioral health or substance abuse call? ---No Guidelines Guideline Title Affirmed Question Affirmed Notes Nurse Date/Time (Eastern Time) Coughing Up Blood [1] Coughed up blood AND [2] > 1 tablespoon (15 ml) (Exception: blood-tinged sputum) [name], RN, Beth 03/05/2020 8:38:49 AM Disp. Time [name] Time) Disposition Final User 03/05/2020 8:40:36 AM See HCP within 4 Hours (or  PCP triage) Yes [name], RN, Beth PLEASE NOTE: All timestamps contained within this report are represented as [place] Standard Time. CONFIDENTIALTY NOTICE: This fax transmission is intended only for the addressee. It contains information that is legally privileged, confidential or otherwise protected from use or disclosure. If you are not the intended recipient, you are strictly prohibited from reviewing, disclosing, copying using or disseminating any of this information or taking any action in reliance on or regarding this information. If you have received this fax in error, please notify us immediately by telephone so that we can arrange for its return to [place]. Phone: [phone], Toll-Free: [phone], Fax: [phone] Page: 2 of 2 Call Id: [id number] [place] Disagree/Comply Comply Caller Understands Yes PreDisposition Did not know what to do Care Advice Given Per Guideline SEE HCP (OR PCP TRIAGE) WITHIN 4 HOURS: * IF OFFICE WILL BE OPEN: You need to be seen within the next 3 or 4 hours. Call your doctor (or NP/PA) now or as soon as the office opens. CALL BACK IF: * You become worse CARE ADVICE given per Coughing Up Blood guideline. Referrals Warm transfer to backline

## 2020-03-05 NOTE — Telephone Encounter (Signed)
Larene Beach CMA spoke with pt and pt is scheduled 03/05/20 at 1:30 for video appt.

## 2020-03-05 NOTE — Assessment & Plan Note (Signed)
Seems to be more bronchial than sinus Did have some blood--but could be from her nose Symptoms don't suggest sinusitis or lower respiratory infection like pneumonia Will try robitussin with codeine for the cough If worsens tomorrow, would try empiric amoxil

## 2020-03-06 ENCOUNTER — Telehealth: Payer: 59 | Admitting: Internal Medicine

## 2020-03-06 ENCOUNTER — Ambulatory Visit (INDEPENDENT_AMBULATORY_CARE_PROVIDER_SITE_OTHER): Payer: 59 | Admitting: Psychology

## 2020-03-06 DIAGNOSIS — F410 Panic disorder [episodic paroxysmal anxiety] without agoraphobia: Secondary | ICD-10-CM | POA: Diagnosis not present

## 2020-03-11 ENCOUNTER — Encounter: Payer: Self-pay | Admitting: Family Medicine

## 2020-03-18 ENCOUNTER — Ambulatory Visit (INDEPENDENT_AMBULATORY_CARE_PROVIDER_SITE_OTHER): Payer: 59 | Admitting: Psychology

## 2020-03-18 DIAGNOSIS — F410 Panic disorder [episodic paroxysmal anxiety] without agoraphobia: Secondary | ICD-10-CM

## 2020-03-20 ENCOUNTER — Ambulatory Visit: Payer: 59 | Admitting: Psychology

## 2020-04-03 ENCOUNTER — Ambulatory Visit (INDEPENDENT_AMBULATORY_CARE_PROVIDER_SITE_OTHER): Payer: 59 | Admitting: Psychology

## 2020-04-03 DIAGNOSIS — F410 Panic disorder [episodic paroxysmal anxiety] without agoraphobia: Secondary | ICD-10-CM

## 2020-04-09 ENCOUNTER — Encounter: Payer: Self-pay | Admitting: Family Medicine

## 2020-04-09 ENCOUNTER — Other Ambulatory Visit: Payer: Self-pay

## 2020-04-09 ENCOUNTER — Other Ambulatory Visit: Payer: Self-pay | Admitting: Family Medicine

## 2020-04-09 MED ORDER — PANTOPRAZOLE SODIUM 40 MG PO TBEC
40.0000 mg | DELAYED_RELEASE_TABLET | Freq: Every day | ORAL | 1 refills | Status: DC
Start: 2020-04-09 — End: 2020-06-05

## 2020-04-17 ENCOUNTER — Ambulatory Visit (INDEPENDENT_AMBULATORY_CARE_PROVIDER_SITE_OTHER): Payer: 59 | Admitting: Psychology

## 2020-04-17 ENCOUNTER — Ambulatory Visit: Payer: 59 | Admitting: Psychology

## 2020-04-17 DIAGNOSIS — F410 Panic disorder [episodic paroxysmal anxiety] without agoraphobia: Secondary | ICD-10-CM

## 2020-04-22 ENCOUNTER — Other Ambulatory Visit: Payer: Self-pay | Admitting: Family Medicine

## 2020-04-22 DIAGNOSIS — F410 Panic disorder [episodic paroxysmal anxiety] without agoraphobia: Secondary | ICD-10-CM

## 2020-04-23 DIAGNOSIS — Z8659 Personal history of other mental and behavioral disorders: Secondary | ICD-10-CM | POA: Insufficient documentation

## 2020-04-24 ENCOUNTER — Other Ambulatory Visit: Payer: 59

## 2020-04-24 ENCOUNTER — Other Ambulatory Visit: Payer: Self-pay

## 2020-04-24 DIAGNOSIS — Z20822 Contact with and (suspected) exposure to covid-19: Secondary | ICD-10-CM

## 2020-04-25 ENCOUNTER — Encounter: Payer: Self-pay | Admitting: Family Medicine

## 2020-04-28 LAB — NOVEL CORONAVIRUS, NAA: SARS-CoV-2, NAA: NOT DETECTED

## 2020-04-30 ENCOUNTER — Telehealth: Payer: Self-pay | Admitting: Family Medicine

## 2020-04-30 NOTE — Telephone Encounter (Signed)
mychart to patient will plan to get labs at her visit next week

## 2020-04-30 NOTE — Telephone Encounter (Signed)
PT CALLED IN WANTED TO KNOW ABOUT GETTING SOME LABS DONE DUE TO SHE IS HAVING DARK URINE AND ITS FROM THE MEDICATION AND WAS TOLD TO CALL DR.

## 2020-05-01 ENCOUNTER — Ambulatory Visit: Payer: 59 | Admitting: Psychology

## 2020-05-01 ENCOUNTER — Inpatient Hospital Stay: Payer: 59

## 2020-05-01 ENCOUNTER — Other Ambulatory Visit: Payer: Self-pay

## 2020-05-01 ENCOUNTER — Inpatient Hospital Stay
Admission: EM | Admit: 2020-05-01 | Discharge: 2020-05-04 | DRG: 418 | Disposition: A | Discharge status: Home / Self Care | Payer: 59 | Attending: General Surgery | Admitting: General Surgery

## 2020-05-01 ENCOUNTER — Ambulatory Visit: Payer: 59 | Admitting: Podiatry

## 2020-05-01 ENCOUNTER — Emergency Department: Payer: 59

## 2020-05-01 DIAGNOSIS — Z20822 Contact with and (suspected) exposure to covid-19: Secondary | ICD-10-CM | POA: Diagnosis present

## 2020-05-01 DIAGNOSIS — Z6839 Body mass index (BMI) 39.0-39.9, adult: Secondary | ICD-10-CM

## 2020-05-01 DIAGNOSIS — F419 Anxiety disorder, unspecified: Secondary | ICD-10-CM | POA: Diagnosis present

## 2020-05-01 DIAGNOSIS — R1011 Right upper quadrant pain: Secondary | ICD-10-CM | POA: Diagnosis not present

## 2020-05-01 DIAGNOSIS — Z888 Allergy status to other drugs, medicaments and biological substances status: Secondary | ICD-10-CM

## 2020-05-01 DIAGNOSIS — K8010 Calculus of gallbladder with chronic cholecystitis without obstruction: Secondary | ICD-10-CM | POA: Diagnosis present

## 2020-05-01 DIAGNOSIS — Z8719 Personal history of other diseases of the digestive system: Secondary | ICD-10-CM | POA: Diagnosis not present

## 2020-05-01 DIAGNOSIS — K851 Biliary acute pancreatitis without necrosis or infection: Principal | ICD-10-CM | POA: Diagnosis present

## 2020-05-01 DIAGNOSIS — K219 Gastro-esophageal reflux disease without esophagitis: Secondary | ICD-10-CM | POA: Diagnosis present

## 2020-05-01 DIAGNOSIS — Z886 Allergy status to analgesic agent status: Secondary | ICD-10-CM

## 2020-05-01 DIAGNOSIS — I10 Essential (primary) hypertension: Secondary | ICD-10-CM | POA: Diagnosis present

## 2020-05-01 DIAGNOSIS — Z83438 Family history of other disorder of lipoprotein metabolism and other lipidemia: Secondary | ICD-10-CM | POA: Diagnosis not present

## 2020-05-01 DIAGNOSIS — R748 Abnormal levels of other serum enzymes: Secondary | ICD-10-CM

## 2020-05-01 DIAGNOSIS — Z8249 Family history of ischemic heart disease and other diseases of the circulatory system: Secondary | ICD-10-CM

## 2020-05-01 DIAGNOSIS — Z90710 Acquired absence of both cervix and uterus: Secondary | ICD-10-CM | POA: Diagnosis not present

## 2020-05-01 DIAGNOSIS — F32A Depression, unspecified: Secondary | ICD-10-CM | POA: Diagnosis present

## 2020-05-01 DIAGNOSIS — Z803 Family history of malignant neoplasm of breast: Secondary | ICD-10-CM | POA: Diagnosis not present

## 2020-05-01 DIAGNOSIS — Z96652 Presence of left artificial knee joint: Secondary | ICD-10-CM | POA: Diagnosis present

## 2020-05-01 DIAGNOSIS — Z885 Allergy status to narcotic agent status: Secondary | ICD-10-CM

## 2020-05-01 DIAGNOSIS — Z823 Family history of stroke: Secondary | ICD-10-CM

## 2020-05-01 DIAGNOSIS — K859 Acute pancreatitis without necrosis or infection, unspecified: Secondary | ICD-10-CM

## 2020-05-01 DIAGNOSIS — K810 Acute cholecystitis: Secondary | ICD-10-CM

## 2020-05-01 DIAGNOSIS — R109 Unspecified abdominal pain: Secondary | ICD-10-CM

## 2020-05-01 DIAGNOSIS — K838 Other specified diseases of biliary tract: Secondary | ICD-10-CM

## 2020-05-01 DIAGNOSIS — Z79899 Other long term (current) drug therapy: Secondary | ICD-10-CM

## 2020-05-01 DIAGNOSIS — K8050 Calculus of bile duct without cholangitis or cholecystitis without obstruction: Secondary | ICD-10-CM

## 2020-05-01 DIAGNOSIS — R1013 Epigastric pain: Secondary | ICD-10-CM | POA: Diagnosis not present

## 2020-05-01 LAB — CBC
HCT: 41.6 % (ref 36.0–46.0)
Hemoglobin: 14.1 g/dL (ref 12.0–15.0)
MCH: 31.5 pg (ref 26.0–34.0)
MCHC: 33.9 g/dL (ref 30.0–36.0)
MCV: 93.1 fL (ref 80.0–100.0)
Platelets: 265 10*3/uL (ref 150–400)
RBC: 4.47 MIL/uL (ref 3.87–5.11)
RDW: 12.6 % (ref 11.5–15.5)
WBC: 6.4 10*3/uL (ref 4.0–10.5)
nRBC: 0 % (ref 0.0–0.2)

## 2020-05-01 LAB — URINALYSIS, COMPLETE (UACMP) WITH MICROSCOPIC
Bilirubin Urine: NEGATIVE
Glucose, UA: NEGATIVE mg/dL
Ketones, ur: 5 mg/dL — AB
Leukocytes,Ua: NEGATIVE
Nitrite: NEGATIVE
Protein, ur: NEGATIVE mg/dL
Specific Gravity, Urine: 1.011 (ref 1.005–1.030)
pH: 5 (ref 5.0–8.0)

## 2020-05-01 LAB — COMPREHENSIVE METABOLIC PANEL
ALT: 611 U/L — ABNORMAL HIGH (ref 0–44)
AST: 422 U/L — ABNORMAL HIGH (ref 15–41)
Albumin: 3.9 g/dL (ref 3.5–5.0)
Alkaline Phosphatase: 164 U/L — ABNORMAL HIGH (ref 38–126)
Anion gap: 12 (ref 5–15)
BUN: 7 mg/dL (ref 6–20)
CO2: 24 mmol/L (ref 22–32)
Calcium: 8.9 mg/dL (ref 8.9–10.3)
Chloride: 103 mmol/L (ref 98–111)
Creatinine, Ser: 0.92 mg/dL (ref 0.44–1.00)
GFR, Estimated: >60 mL/min (ref >60)
Glucose, Bld: 136 mg/dL — ABNORMAL HIGH (ref 70–99)
Potassium: 3.8 mmol/L (ref 3.5–5.1)
Sodium: 139 mmol/L (ref 135–145)
Total Bilirubin: 3.7 mg/dL — ABNORMAL HIGH (ref 0.3–1.2)
Total Protein: 7.9 g/dL (ref 6.5–8.1)

## 2020-05-01 LAB — RESP PANEL BY RT-PCR (FLU A&B, COVID) ARPGX2
Influenza A by PCR: NEGATIVE
Influenza B by PCR: NEGATIVE
SARS Coronavirus 2 by RT PCR: NEGATIVE

## 2020-05-01 LAB — LIPASE, BLOOD: Lipase: 1340 U/L — ABNORMAL HIGH (ref 11–51)

## 2020-05-01 MED ORDER — ONDANSETRON 4 MG PO TBDP: 4.0000 mg | ORAL_TABLET | Freq: Four times a day (QID) | ORAL | Status: DC | PRN

## 2020-05-01 MED ORDER — ONDANSETRON 4 MG PO TBDP: 4.0000 mg | ORAL_TABLET | ORAL | Status: DC | PRN

## 2020-05-01 MED ORDER — HYDROMORPHONE HCL 1 MG/ML IJ SOLN
1.0000 mg | Freq: Once | INTRAMUSCULAR | Status: AC
  Administered 2020-05-01: 1 mg via INTRAVENOUS
  Filled 2020-05-01: qty 1

## 2020-05-01 MED ORDER — MORPHINE SULFATE (PF) 4 MG/ML IV SOLN
4.0000 mg | Freq: Once | INTRAVENOUS | Status: AC
  Administered 2020-05-01: 4 mg via INTRAVENOUS
  Filled 2020-05-01: qty 1

## 2020-05-01 MED ORDER — KETOROLAC TROMETHAMINE 30 MG/ML IJ SOLN: 30.0000 mg | Freq: Four times a day (QID) | INTRAMUSCULAR | Status: DC | PRN

## 2020-05-01 MED ORDER — PIPERACILLIN-TAZOBACTAM 3.375 G IVPB
3.3750 g | Freq: Three times a day (TID) | INTRAVENOUS | Status: DC
  Administered 2020-05-01 – 2020-05-03 (×6): 3.375 g via INTRAVENOUS
  Filled 2020-05-01 (×6): qty 50

## 2020-05-01 MED ORDER — LORAZEPAM 2 MG/ML IJ SOLN
2.0000 mg | Freq: Once | INTRAMUSCULAR | Status: AC
  Administered 2020-05-01: 2 mg via INTRAVENOUS
  Filled 2020-05-01: qty 1

## 2020-05-01 MED ORDER — KETOROLAC TROMETHAMINE 30 MG/ML IJ SOLN
30.0000 mg | Freq: Four times a day (QID) | INTRAMUSCULAR | Status: DC
  Administered 2020-05-01 – 2020-05-04 (×10): 30 mg via INTRAVENOUS
  Filled 2020-05-01 (×11): qty 1

## 2020-05-01 MED ORDER — ONDANSETRON HCL 4 MG/2ML IJ SOLN
4.0000 mg | Freq: Once | INTRAMUSCULAR | Status: AC
  Administered 2020-05-01: 4 mg via INTRAVENOUS
  Filled 2020-05-01: qty 2

## 2020-05-01 MED ORDER — SUMATRIPTAN SUCCINATE 50 MG PO TABS
50.0000 mg | ORAL_TABLET | ORAL | Status: DC | PRN
  Administered 2020-05-01 – 2020-05-02 (×3): 50 mg via ORAL
  Filled 2020-05-01 (×6): qty 1

## 2020-05-01 MED ORDER — PROMETHAZINE HCL 25 MG/ML IJ SOLN: 12.5000 mg | INTRAMUSCULAR | Status: DC | PRN

## 2020-05-01 MED ORDER — HYDROMORPHONE HCL 1 MG/ML IJ SOLN
1.0000 mg | Freq: Once | INTRAMUSCULAR | Status: AC
Start: 2020-05-01 — End: 2020-05-01
  Administered 2020-05-01: 1 mg via INTRAVENOUS
  Filled 2020-05-01: qty 1

## 2020-05-01 MED ORDER — OXYCODONE HCL 5 MG PO TABS: 5.0000 mg | ORAL_TABLET | ORAL | Status: DC | PRN

## 2020-05-01 MED ORDER — ACETAMINOPHEN 500 MG PO TABS
1000.0000 mg | ORAL_TABLET | Freq: Four times a day (QID) | ORAL | Status: DC
  Administered 2020-05-01 – 2020-05-04 (×9): 1000 mg via ORAL
  Filled 2020-05-01 (×10): qty 2

## 2020-05-01 MED ORDER — SODIUM CHLORIDE 0.9 % IV SOLN
1000.0000 mL | Freq: Once | INTRAVENOUS | Status: AC
  Administered 2020-05-01: 1000 mL via INTRAVENOUS

## 2020-05-01 MED ORDER — HYDROMORPHONE HCL 1 MG/ML IJ SOLN
0.5000 mg | INTRAMUSCULAR | Status: DC | PRN
  Administered 2020-05-03 (×2): 0.5 mg via INTRAVENOUS
  Filled 2020-05-01 (×2): qty 0.5

## 2020-05-01 MED ORDER — ONDANSETRON HCL 4 MG/2ML IJ SOLN
4.0000 mg | INTRAMUSCULAR | Status: DC | PRN
  Administered 2020-05-03: 4 mg via INTRAVENOUS
  Filled 2020-05-01: qty 2

## 2020-05-01 MED ORDER — CITALOPRAM HYDROBROMIDE 20 MG PO TABS
40.0000 mg | ORAL_TABLET | Freq: Every day | ORAL | Status: DC
  Administered 2020-05-01 – 2020-05-02 (×2): 40 mg via ORAL
  Filled 2020-05-01 (×3): qty 2

## 2020-05-01 MED ORDER — ENOXAPARIN SODIUM 40 MG/0.4ML ~~LOC~~ SOLN
40.0000 mg | SUBCUTANEOUS | Status: DC
  Administered 2020-05-01 – 2020-05-02 (×2): 40 mg via SUBCUTANEOUS
  Filled 2020-05-01 (×2): qty 0.4

## 2020-05-01 MED ORDER — GADOBUTROL 1 MMOL/ML IV SOLN
10.0000 mL | Freq: Once | INTRAVENOUS | Status: AC | PRN
  Administered 2020-05-01: 10 mL via INTRAVENOUS
  Filled 2020-05-01: qty 10

## 2020-05-01 MED ORDER — SODIUM CHLORIDE 0.9 % IV SOLN: INTRAVENOUS | Status: DC

## 2020-05-01 MED ORDER — ONDANSETRON HCL 4 MG/2ML IJ SOLN
4.0000 mg | Freq: Four times a day (QID) | INTRAMUSCULAR | Status: DC | PRN
  Administered 2020-05-01 (×2): 4 mg via INTRAVENOUS
  Filled 2020-05-01 (×2): qty 2

## 2020-05-01 MED ORDER — MORPHINE SULFATE (PF) 2 MG/ML IV SOLN
2.0000 mg | INTRAVENOUS | Status: DC | PRN
  Administered 2020-05-01 (×2): 2 mg via INTRAVENOUS
  Filled 2020-05-01: qty 2
  Filled 2020-05-01: qty 1

## 2020-05-01 NOTE — ED Notes (Signed)
This RN at bedside to answer call bell. Pt remains in 10/10 pain. Still waiting for MD response

## 2020-05-01 NOTE — ED Triage Notes (Signed)
Pt in with co RUQ pain since Tuesday, with diarrhea. No vomiting, does not have hx of the same.

## 2020-05-01 NOTE — ED Notes (Signed)
Pain continues at 8/10

## 2020-05-01 NOTE — ED Notes (Signed)
Pain 8/10 prior to medication.  ruq abd pain starting last pm, attributed to reflux but pain continued to get worse.  +n/v.  +diarrhea.

## 2020-05-01 NOTE — ED Notes (Signed)
Up to BR, tolerated well.

## 2020-05-01 NOTE — ED Notes (Signed)
This RN at bedside to answer call bell. Pt hunched over bed railing in pain. MD messaged in regards to pain control. Awaiting response at this time.

## 2020-05-01 NOTE — ED Notes (Signed)
Pain starting to ease, rates as 7/10, appears to be resting more comfortably.  Warm blankets given.

## 2020-05-01 NOTE — H&P (Addendum)
[facility] SURGICAL HISTORY & PHYSICAL (cpt [phone])  HISTORY OF PRESENT ILLNESS (HPI):  50 y.o. female presented to [facility] ED today for abdominal pain. Patient reports about 24-48 hour of right sided abdominal pain which radiates through to he back. This was crampy and sharp in nature. She initially thought this was related to GERD symptoms and was started on PPI by her primary care doctor. However, her abdominal pain continued to progressively worsen throughout the last day or so. This was rated at a 9/10 at the worst, and was exacerbated by positional changes. She reports a subjective fever at home as well as nausea and emesis. No fever, chills, cough, congestion, SOB, CP, or bowel changes. She has noticed her urine is darker in color. She denied any history of similar presentation in the past. Only previous intra-abdominal surgery was laparoscopic hysterectomy. Work up in ED, revealed a normal WBC at 6.4k, elevated lipase to 1340, and hyperbilirubinemia to 3.7. She did have RUQ [place] which showed sludge and cholelithiasis with some wall thickening.   General surgery is consulted by emergency medicine physician Dr Lavonia Drafts, MD for evaluation and management of likely gallstone pancreatitis with possible cholecystitis.    PAST MEDICAL HISTORY (PMH):  Past Medical History:  Diagnosis Date  . Anemia   . Anxiety   . Colon polyp   . Depression   . GERD (gastroesophageal reflux disease)   . Headache    migraines   . History of hysterectomy    still has both ovaries  . History of migraine headaches   . PONV (postoperative nausea and vomiting)     Reviewed. Otherwise negative.   PAST SURGICAL HISTORY ([place]):  Past Surgical History:  Procedure Laterality Date  . ABDOMINAL HYSTERECTOMY    . COLONOSCOPY    . KNEE ARTHROPLASTY    . KNEE ARTHROSCOPY    . knee athroscopy  5/12   LT  . LAPAROSCOPIC VAGINAL HYSTERECTOMY  5/11   Supra cervical  . left partial knee replacement     . PARTIAL KNEE ARTHROPLASTY Left 02/10/2014   Procedure: LEFT MEDIAL UNICOMPARTMENTAL KNEE ARTHROPLASTY;  Surgeon: Gearlean Alf, MD;  Location: [facility];  Service: Orthopedics;  Laterality: Left;  . UPPER GI ENDOSCOPY    . [place]    Reviewed. Otherwise negative.   MEDICATIONS:  Prior to Admission medications   Medication Sig Start Date End Date Taking? Authorizing Provider  ALPRAZolam (XANAX) 0.25 MG tablet TAKE 1 TABLET(0.25 MG) BY MOUTH DAILY AS NEEDED FOR ANXIETY OR SLEEP 04/23/20   [name], MD  citalopram (CELEXA) 40 MG tablet Take 1 tablet (40 mg total) by mouth daily. 03/28/19   Lucille Passy, MD  guaiFENesin-codeine 100-10 MG/5ML syrup Take 10 mLs by mouth 3 (three) times daily as needed for cough. 03/05/20   Venia Carbon, MD  pantoprazole (PROTONIX) 40 MG tablet Take 1 tablet (40 mg total) by mouth daily. 04/09/20   [name], MD  SUMAtriptan (IMITREX) 50 MG tablet TAKE 1 TABLET MAY REPEAT IN 2 HOURS IF HEADACHE PERSISTS OR RECURS. DON'T USE MORE THAN TWO IN 24HRS 12/11/19   [name], MD  traMADol (ULTRAM) 50 MG tablet Take 1 tablet (50 mg total) by mouth every 6 (six) hours as needed. 02/07/20   Margarette Canada, NP  nortriptyline (PAMELOR) 10 MG capsule Start Nortriptyline (Pamelor) 10 mg nightly for one week, then increase to 20 mg nightly 10/30/19 02/07/20  [provider]  ALLERGIES:  Allergies  Allergen Reactions  . Ibuprofen Swelling    Bilateral swelling of ankles and feet  . Diflucan [Fluconazole] Other (See Comments)    Pelvic pain  . Onion Other (See Comments)    migraines   . Topamax [Topiramate] Other (See Comments)    Reaction:  Body aches    . Omeprazole   . Hydrocodone Anxiety, Palpitations and Other (See Comments)    Reaction:  Migraines      SOCIAL HISTORY:  Social History   Socioeconomic History  . Marital status: Single    Spouse name: Not on file  . Number of children: 1  . Years of  education: Not on file  . Highest education level: Not on file  Occupational History  . 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 Smoking status: Never Smoker  . Smokeless tobacco: Never Used  Vaping Use  . Vaping Use: Never used  Substance and Sexual Activity  . Alcohol use: Yes    Alcohol/week: 0.0 standard drinks    Comment: occassional- once a month has about 2-3 drinks  . Drug use: No  . Sexual activity: Never  Other Topics Concern  . Not on file  Social History Narrative  . Not on file   Social Determinants of Health   Financial Resource Strain: Not on file  Food Insecurity: Not on file  Transportation Needs: Not on file  Physical Activity: Not on file  Stress: Not on file  Social Connections: Not on file  Intimate Partner Violence: Not on file     FAMILY HISTORY:  Family History  Problem Relation Age of Onset  . Stroke Father   . Heart attack Father   . Hyperlipidemia Mother   . Hypertension Mother   . Breast cancer Maternal Aunt   . Colon cancer Neg Hx   . Colon polyps Neg Hx   . Kidney disease Neg Hx   . Gallbladder disease Neg Hx   . Esophageal cancer Neg Hx   . Alcohol abuse Neg Hx   . Drug abuse Neg Hx   . Depression Neg Hx   . Bipolar disorder Neg Hx   . Anxiety disorder Neg Hx   . Schizophrenia Neg Hx   . Suicidality Neg Hx     Otherwise negative.   REVIEW OF SYSTEMS:  Review of Systems  Constitutional: Positive for fever (Subjective). Negative for chills.  HENT: Negative for congestion and sore throat.   Respiratory: Negative for cough and shortness of breath.   Cardiovascular: Negative for chest pain and palpitations.  Gastrointestinal: Positive for abdominal pain, nausea and vomiting. Negative for blood in stool, constipation and diarrhea.  Genitourinary: Negative for dysuria and urgency.  All other systems reviewed and are negative.   VITAL SIGNS:  Temp:  [98.1 F (36.7 C)] 98.1 F (36.7 C) (01/14 0301) Pulse Rate:   [83-87] 83 (01/14 0718) Resp:  [20] 20 (01/14 0718) BP: (137-140)/(81-85) 140/85 (01/14 0718) SpO2:  [98 %-99 %] 99 % (01/14 0718) Weight:  [104.3 kg] 104.3 kg (01/14 0302)     Height: 5\' 4"  (162.6 cm) Weight: 104.3 kg BMI (Calculated): 39.46   PHYSICAL EXAM:  Physical Exam Vitals and nursing note reviewed. Exam conducted with a chaperone present.  Constitutional:      General: She is not in acute distress.    Appearance: She is well-developed. She is obese. She is not ill-appearing.  HENT:     Head: Normocephalic and atraumatic.  Eyes:  General: Scleral icterus present.     Extraocular Movements: Extraocular movements intact.  Cardiovascular:     Rate and Rhythm: Normal rate and regular rhythm.     Heart sounds: Normal heart sounds. No murmur heard.   Pulmonary:     Effort: Pulmonary effort is normal. No respiratory distress.  Abdominal:     General: A surgical scar is present.     Palpations: Abdomen is soft.     Tenderness: There is abdominal tenderness in the right upper quadrant and epigastric area. There is no guarding or rebound. Negative signs include Murphy's sign.     Comments: Abdomen is obese, soft, she does have tenderness in the epigastrium and RUQ, this is more mild when distracted, non-distended, she does have previous laparoscopic scars seen.   Genitourinary:    Comments: Deferred Skin:    General: Skin is warm and dry.     Coloration: Skin is not jaundiced or pale.  Neurological:     General: No focal deficit present.     Mental Status: She is alert and oriented to person, place, and time.  Psychiatric:        Mood and Affect: Mood normal.        Behavior: Behavior normal.     INTAKE/OUTPUT:  This shift: No intake/output data recorded.  Last 2 shifts: @IOLAST2SHIFTS @  Labs:  CBC Latest Ref Rng & Units 05/01/2020 11/06/2019 07/22/2019  WBC 4.0 - 10.5 K/uL 6.4 9.4 7.0  Hemoglobin 12.0 - 15.0 g/dL 14.1 14.8 12.5  Hematocrit 36.0 - 46.0 % 41.6 44.1 38.9   Platelets 150 - 400 K/uL 265 307 285   CMP Latest Ref Rng & Units 05/01/2020 12/31/2019 05/24/2019  Glucose 70 - 99 mg/dL 136(H) 103(H) 101(H)  BUN 6 - 20 mg/dL 7 11 14   Creatinine 0.44 - 1.00 mg/dL 0.92 0.89 1.05(H)  Sodium 135 - 145 mmol/L 139 135 139  Potassium 3.5 - 5.1 mmol/L 3.8 4.8 3.9  Chloride 98 - 111 mmol/L 103 96 100  CO2 22 - 32 mmol/L 24 26 30   Calcium 8.9 - 10.3 mg/dL 8.9 9.8 9.5  Total Protein 6.5 - 8.1 g/dL 7.9 6.9 7.4  Total Bilirubin 0.3 - 1.2 mg/dL 3.7(H) 0.3 0.3  Alkaline Phos 38 - 126 U/L 164(H) 123(H) 71  AST 15 - 41 U/L 422(H) 77(H) 27  ALT 0 - 44 U/L 611(H) 109(H) 32     Imaging studies:   RUQ [place] (05/01/2020) personally reviewed showing sludge/choleltihiaisi with gallbladder wall thickening concerning for possible cholecystitis, CBD normal diameter, and radiologist report reviewed:  IMPRESSION: 1. Suspicious for Acute Cholecystitis: Abundant dependent gravel type gallstones throughout the gallbladder which appears mildly dilated. Wall thickness is at the upper limits of normal and a positive sonographic Murphy sign was elicited. 2. Echogenic liver with suggestion of nodular liver contour, suggesting fatty liver disease with cirrhosis. 3. No bile duct enlargement.   Assessment/Plan: (ICD-10's: K85.10) 50 y.o. female with RUQ/Epigastric abdominal pain found to have hyperbilirubinemia and elevated lipase levels most likely attributable to gallstone pancreatitis, there is some question of concomitant cholecystitis however I think this is less likely given her normal leukocytosis and changes on [place] may better represent chronic cholecystitis.    - Will plan on admission to surgery team  - Plan for MRCP to rule out choledocholithiasis  - Pending results of MRCP, may need GI consultation  - She will benefit cholecystectomy this admission once recovered from pancreatitis   - NPO (okay for sips with  meds) + IVF Resuscitation  - I will start her on IV ABx (Zosyn) as  a precaution  - Monitor abdominal examination; on-going bowel function  - Pain control prn; antiemetics prn  - Morning labs: CBC, CMP   - Mobilization as toelrates   - Ordered home medications   - DVT prophylaxis  All of the above findings and recommendations were discussed with the patient, and all of her questions were answered to her expressed satisfaction.  -- Edison Simon, PA-C [facility] 05/01/2020, 8:23 AM [phone] M-F: 7am - 4pm  MRPC negative for choledocholithiasis. Will monitor lipase and plan for cholecystectomy once pancreatitis resolved. I saw and evaluated the patient.  I agree with the above documentation, exam, and plan, which I have edited where appropriate. Fredirick Maudlin  1:19 PM

## 2020-05-01 NOTE — ED Notes (Signed)
This RN at bedside to answer call bell. Pt stating pain is increasing. PRN pain medication administered

## 2020-05-01 NOTE — ED Provider Notes (Signed)
[facility] Emergency Department Provider Note   ____________________________________________    I have reviewed the triage vital signs and the nursing notes.   HISTORY  Chief Complaint Abdominal Pain     HPI Patricia Bauer is a 50 y.o. female who presents with complaints of right upper quadrant abdominal pain.  Patient reports pain started yesterday and has been constant.  She reports the pain is severe and radiates into her back.  She has never had this before.  History of hysterectomy in the past. positive nausea and vomiting.  Has not taken anything for this.  Past Medical History:  Diagnosis Date  . Anemia   . Anxiety   . Colon polyp   . Depression   . GERD (gastroesophageal reflux disease)   . Headache    migraines   . History of hysterectomy    still has both ovaries  . History of migraine headaches   . PONV (postoperative nausea and vomiting)     Patient Active Problem List   Diagnosis Date Noted  . Acute upper respiratory infection 03/05/2020  . Decreased sensation 10/10/2019  . Freckles 10/10/2019  . Back injury 03/27/2019  . Fatigue 01/15/2019  . Myalgia 04/23/2018  . IDA (iron deficiency anemia) 02/16/2018  . Vitamin D deficiency 01/22/2018  . Right foot pain 01/22/2018  . History of partial knee replacement left 07/14/2017  . De Quervain's tenosynovitis, right 03/02/2017  . Metatarsalgia, right foot 03/02/2017  . LLQ pain 06/03/2015  . Major depressive disorder, recurrent, severe without psychotic features ([place]) 01/15/2015  . GAD (generalized anxiety disorder) 01/15/2015  . Panic disorder without agoraphobia 01/15/2015  . Arthralgia 11/12/2014  . Hemorrhoid prolapse 11/09/2014  . Morbid obesity ([place]) 09/08/2014  . OA (osteoarthritis) of knee 02/10/2014  . Personal history of colonic polyps 10/16/2012  . History of headache 03/08/2012  . History of hysterectomy, supracervical 10/23/2011  . Anemia 09/08/2011  .  GERD (gastroesophageal reflux disease) 09/08/2011  . Anxiety 09/08/2011  . Migraine headache 09/08/2011  . H/O: hysterectomy 09/08/2011    Past Surgical History:  Procedure Laterality Date  . ABDOMINAL HYSTERECTOMY    . COLONOSCOPY    . KNEE ARTHROPLASTY    . KNEE ARTHROSCOPY    . knee athroscopy  5/12   LT  . LAPAROSCOPIC VAGINAL HYSTERECTOMY  5/11   Supra cervical  . left partial knee replacement    . PARTIAL KNEE ARTHROPLASTY Left 02/10/2014   Procedure: LEFT MEDIAL UNICOMPARTMENTAL KNEE ARTHROPLASTY;  Surgeon: Gearlean Alf, MD;  Location: [facility];  Service: Orthopedics;  Laterality: Left;  . UPPER GI ENDOSCOPY    . [place]    Prior to Admission medications   Medication Sig Start Date End Date Taking? Authorizing Provider  ALPRAZolam (XANAX) 0.25 MG tablet TAKE 1 TABLET(0.25 MG) BY MOUTH DAILY AS NEEDED FOR ANXIETY OR SLEEP 04/23/20   [name], MD  citalopram (CELEXA) 40 MG tablet Take 1 tablet (40 mg total) by mouth daily. 03/28/19   Lucille Passy, MD  guaiFENesin-codeine 100-10 MG/5ML syrup Take 10 mLs by mouth 3 (three) times daily as needed for cough. 03/05/20   Venia Carbon, MD  pantoprazole (PROTONIX) 40 MG tablet Take 1 tablet (40 mg total) by mouth daily. 04/09/20   [name], MD  SUMAtriptan (IMITREX) 50 MG tablet TAKE 1 TABLET MAY REPEAT IN 2 HOURS IF HEADACHE PERSISTS OR RECURS. DON'T USE MORE THAN TWO IN 24HRS 12/11/19  Lynnda Child, MD  traMADol (ULTRAM) 50 MG tablet Take 1 tablet (50 mg total) by mouth every 6 (six) hours as needed. 02/07/20   Becky Augusta, NP  nortriptyline (PAMELOR) 10 MG capsule Start Nortriptyline (Pamelor) 10 mg nightly for one week, then increase to 20 mg nightly 10/30/19 02/07/20  [provider]     Allergies Ibuprofen, Diflucan [fluconazole], Onion, Topamax [topiramate], Omeprazole, and Hydrocodone  Family History  Problem Relation Age of Onset  . Stroke Father   . Heart attack Father    . Hyperlipidemia Mother   . Hypertension Mother   . Breast cancer Maternal Aunt   . Colon cancer Neg Hx   . Colon polyps Neg Hx   . Kidney disease Neg Hx   . Gallbladder disease Neg Hx   . Esophageal cancer Neg Hx   . Alcohol abuse Neg Hx   . Drug abuse Neg Hx   . Depression Neg Hx   . Bipolar disorder Neg Hx   . Anxiety disorder Neg Hx   . Schizophrenia Neg Hx   . Suicidality Neg Hx     Social History Social History   Tobacco Use  . Smoking status: Never Smoker  . Smokeless tobacco: Never Used  Vaping Use  . Vaping Use: Never used  Substance Use Topics  . Alcohol use: Yes    Alcohol/week: 0.0 standard drinks    Comment: occassional- once a month has about 2-3 drinks  . Drug use: No    Review of Systems  Constitutional: No fever/chills Eyes: No visual changes.  ENT: No sore throat. Cardiovascular: Denies chest pain. Respiratory: Denies shortness of breath. Gastrointestinal: As above Genitourinary: Negative for dysuria. Musculoskeletal: Negative for back pain. Skin: Negative for rash. Neurological: Negative for headaches or weakness   ____________________________________________   PHYSICAL EXAM:  VITAL SIGNS: ED Triage Vitals  Enc Vitals Group     BP 05/01/20 0301 137/81     Pulse Rate 05/01/20 0301 87     Resp 05/01/20 0301 20     Temp 05/01/20 0301 98.1 F (36.7 C)     Temp Source 05/01/20 0301 Oral     SpO2 05/01/20 0301 98 %     Weight 05/01/20 0302 104.3 kg (230 lb)     Height 05/01/20 0302 1.626 m (5\' 4" )     Head Circumference --      Peak Flow --      Pain Score 05/01/20 0302 8     Pain Loc --      Pain Edu? --      Excl. in GC? --     Constitutional: Alert and oriented.  Uncomfortable  Nose: No congestion/rhinnorhea. Mouth/Throat: Mucous membranes are moist.    Cardiovascular: Normal rate, regular rhythm. Grossly normal heart sounds.  Good peripheral circulation. Respiratory: Normal respiratory effort.  No retractions. Lungs  CTAB. Gastrointestinal: Soft, significant tenderness palpation upper quadrant. No distention.  No CVA tenderness.  Musculoskeletal: .  Warm and well perfused Neurologic:  Normal speech and language. No gross focal neurologic deficits are appreciated.  Skin:  Skin is warm, dry and intact. No rash noted. Psychiatric: Mood and affect are normal. Speech and behavior are normal.  ____________________________________________   LABS (all labs ordered are listed, but only abnormal results are displayed)  Labs Reviewed  COMPREHENSIVE METABOLIC PANEL - Abnormal; Notable for the following components:      Result Value   Glucose, Bld 136 (*)    AST 422 (*)  ALT 611 (*)    Alkaline Phosphatase 164 (*)    Total Bilirubin 3.7 (*)    All other components within normal limits  LIPASE, BLOOD - Abnormal; Notable for the following components:   Lipase 1,340 (*)    All other components within normal limits  URINALYSIS, COMPLETE (UACMP) WITH MICROSCOPIC - Abnormal; Notable for the following components:   Color, Urine AMBER (*)    APPearance CLEAR (*)    Hgb urine dipstick SMALL (*)    Ketones, ur 5 (*)    Bacteria, UA RARE (*)    All other components within normal limits  RESP PANEL BY RT-PCR (FLU A&B, COVID) ARPGX2  CBC   ____________________________________________  EKG  None ____________________________________________  RADIOLOGY  Ultrasound reviewed by me, most consistent with acute cholecystitis ____________________________________________   PROCEDURES  Procedure(s) performed: No  Procedures   Critical Care performed: No ____________________________________________   INITIAL IMPRESSION / ASSESSMENT AND PLAN / ED COURSE  Pertinent labs & imaging results that were available during my care of the patient were reviewed by me and considered in my medical decision making (see chart for details).  Patient presents with right upper quadrant abdominal pain as described above  significant tenderness to the area, ultrasound suspicious for acute cholecystitis.  Lab test notable for elevated lipase, LFTs, bili which could be related to choledocholithiasis  Have consulted surgery and GI, treated with IV morphine which did little for her pain so added on IV Dilaudid and IV Zofran  Surgery will admit      ____________________________________________   FINAL CLINICAL IMPRESSION(S) / ED DIAGNOSES  Final diagnoses:  Abdominal pain  Acute cholecystitis  Choledocholithiasis        Note:  This document was prepared using Dragon voice recognition software and may include unintentional dictation errors.   Lavonia Drafts, MD 05/01/20 1153

## 2020-05-02 ENCOUNTER — Encounter: Payer: Self-pay | Admitting: General Surgery

## 2020-05-02 LAB — COMPREHENSIVE METABOLIC PANEL
ALT: 436 U/L — ABNORMAL HIGH (ref 0–44)
AST: 215 U/L — ABNORMAL HIGH (ref 15–41)
Albumin: 3.2 g/dL — ABNORMAL LOW (ref 3.5–5.0)
Alkaline Phosphatase: 128 U/L — ABNORMAL HIGH (ref 38–126)
Anion gap: 12 (ref 5–15)
BUN: 12 mg/dL (ref 6–20)
CO2: 21 mmol/L — ABNORMAL LOW (ref 22–32)
Calcium: 8.4 mg/dL — ABNORMAL LOW (ref 8.9–10.3)
Chloride: 106 mmol/L (ref 98–111)
Creatinine, Ser: 1.02 mg/dL — ABNORMAL HIGH (ref 0.44–1.00)
GFR, Estimated: >60 mL/min (ref >60)
Glucose, Bld: 100 mg/dL — ABNORMAL HIGH (ref 70–99)
Potassium: 3.8 mmol/L (ref 3.5–5.1)
Sodium: 139 mmol/L (ref 135–145)
Total Bilirubin: 2.2 mg/dL — ABNORMAL HIGH (ref 0.3–1.2)
Total Protein: 6.3 g/dL — ABNORMAL LOW (ref 6.5–8.1)

## 2020-05-02 LAB — CBC
HCT: 39.3 % (ref 36.0–46.0)
Hemoglobin: 13.2 g/dL (ref 12.0–15.0)
MCH: 32.1 pg (ref 26.0–34.0)
MCHC: 33.6 g/dL (ref 30.0–36.0)
MCV: 95.6 fL (ref 80.0–100.0)
Platelets: 235 10*3/uL (ref 150–400)
RBC: 4.11 MIL/uL (ref 3.87–5.11)
RDW: 12.9 % (ref 11.5–15.5)
WBC: 5.8 10*3/uL (ref 4.0–10.5)
nRBC: 0 % (ref 0.0–0.2)

## 2020-05-02 LAB — LIPASE, BLOOD: Lipase: 167 U/L — ABNORMAL HIGH (ref 11–51)

## 2020-05-02 MED ORDER — PROMETHAZINE HCL 25 MG/ML IJ SOLN
6.2500 mg | INTRAMUSCULAR | Status: DC | PRN
  Administered 2020-05-02: 6.25 mg via INTRAVENOUS
  Filled 2020-05-02: qty 1

## 2020-05-02 NOTE — H&P (Signed)
[facility] PROGRESS NOTE  HISTORY OF PRESENT ILLNESS (HPI):  50 y.o. female presented to [facility] ED today for abdominal pain. Patient reports about 24-48 hour of right sided abdominal pain which radiates through to he back. This was crampy and sharp in nature. She initially thought this was related to GERD symptoms and was started on PPI by her primary care doctor. However, her abdominal pain continued to progressively worsen throughout the last day or so. This was rated at a 9/10 at the worst, and was exacerbated by positional changes. She reports a subjective fever at home as well as nausea and emesis. No fever, chills, cough, congestion, SOB, CP, or bowel changes. She has noticed her urine is darker in color. She denied any history of similar presentation in the past. Only previous intra-abdominal surgery was laparoscopic hysterectomy. Work up in ED, revealed a normal WBC at 6.4k, elevated lipase to 1340, and hyperbilirubinemia to 3.7. She did have RUQ [place] which showed sludge and cholelithiasis with some wall thickening.   General surgery is consulted by emergency medicine physician Dr Lavonia Drafts, MD for evaluation and management of likely gallstone pancreatitis with possible cholecystitis.   INTERVAL HISTORY (05/03/19): Pain has improved significantly.  She denies any further nausea or vomiting.  Labs also improving.  PAST MEDICAL HISTORY (PMH):  Past Medical History:  Diagnosis Date  . Anemia   . Anxiety   . Colon polyp   . Depression   . GERD (gastroesophageal reflux disease)   . Headache    migraines   . History of hysterectomy    still has both ovaries  . History of migraine headaches   . PONV (postoperative nausea and vomiting)     Reviewed. Otherwise negative.   PAST SURGICAL HISTORY ([place]):  Past Surgical History:  Procedure Laterality Date  . ABDOMINAL HYSTERECTOMY    . COLONOSCOPY    . KNEE ARTHROPLASTY    . KNEE ARTHROSCOPY    . knee athroscopy  5/12    LT  . LAPAROSCOPIC VAGINAL HYSTERECTOMY  5/11   Supra cervical  . left partial knee replacement    . PARTIAL KNEE ARTHROPLASTY Left 02/10/2014   Procedure: LEFT MEDIAL UNICOMPARTMENTAL KNEE ARTHROPLASTY;  Surgeon: Gearlean Alf, MD;  Location: [facility];  Service: Orthopedics;  Laterality: Left;  . UPPER GI ENDOSCOPY    . [place]    Reviewed. Otherwise negative.   MEDICATIONS:  Prior to Admission medications   Medication Sig Start Date End Date Taking? Authorizing Provider  ALPRAZolam (XANAX) 0.25 MG tablet TAKE 1 TABLET(0.25 MG) BY MOUTH DAILY AS NEEDED FOR ANXIETY OR SLEEP 04/23/20   [name], MD  citalopram (CELEXA) 40 MG tablet Take 1 tablet (40 mg total) by mouth daily. 03/28/19   Lucille Passy, MD  guaiFENesin-codeine 100-10 MG/5ML syrup Take 10 mLs by mouth 3 (three) times daily as needed for cough. 03/05/20   Venia Carbon, MD  pantoprazole (PROTONIX) 40 MG tablet Take 1 tablet (40 mg total) by mouth daily. 04/09/20   [name], MD  SUMAtriptan (IMITREX) 50 MG tablet TAKE 1 TABLET MAY REPEAT IN 2 HOURS IF HEADACHE PERSISTS OR RECURS. DON'T USE MORE THAN TWO IN 24HRS 12/11/19   [name], MD  traMADol (ULTRAM) 50 MG tablet Take 1 tablet (50 mg total) by mouth every 6 (six) hours as needed. 02/07/20   Margarette Canada, NP  nortriptyline (PAMELOR) 10 MG capsule Start Nortriptyline (Pamelor) 10 mg nightly  for one week, then increase to 20 mg nightly 10/30/19 02/07/20  [provider]     ALLERGIES:  Allergies  Allergen Reactions  . Ibuprofen Swelling    Bilateral swelling of ankles and feet  . Diflucan [Fluconazole] Other (See Comments)    Pelvic pain  . Onion Other (See Comments)    migraines   . Topamax [Topiramate] Other (See Comments)    Reaction:  Body aches    . Omeprazole   . Hydrocodone Anxiety, Palpitations and Other (See Comments)    Reaction:  Migraines      SOCIAL HISTORY:  Social History   Socioeconomic History   . Marital status: Single    Spouse name: Not on file  . Number of children: 1  . Years of education: Not on file  . Highest education level: Not on file  Occupational History  . 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 Smoking status: Never Smoker  . Smokeless tobacco: Never Used  Vaping Use  . Vaping Use: Never used  Substance and Sexual Activity  . Alcohol use: Yes    Alcohol/week: 0.0 standard drinks    Comment: occassional- once a month has about 2-3 drinks  . Drug use: No  . Sexual activity: Never  Other Topics Concern  . Not on file  Social History Narrative  . Not on file   Social Determinants of Health   Financial Resource Strain: Not on file  Food Insecurity: Not on file  Transportation Needs: Not on file  Physical Activity: Not on file  Stress: Not on file  Social Connections: Not on file  Intimate Partner Violence: Not on file     FAMILY HISTORY:  Family History  Problem Relation Age of Onset  . Stroke Father   . Heart attack Father   . Hyperlipidemia Mother   . Hypertension Mother   . Breast cancer Maternal Aunt   . Colon cancer Neg Hx   . Colon polyps Neg Hx   . Kidney disease Neg Hx   . Gallbladder disease Neg Hx   . Esophageal cancer Neg Hx   . Alcohol abuse Neg Hx   . Drug abuse Neg Hx   . Depression Neg Hx   . Bipolar disorder Neg Hx   . Anxiety disorder Neg Hx   . Schizophrenia Neg Hx   . Suicidality Neg Hx     Otherwise negative.   REVIEW OF SYSTEMS:  Review of Systems  Constitutional: Negative for chills and fever (Subjective).  HENT: Negative for congestion and sore throat.   Respiratory: Negative for cough and shortness of breath.   Cardiovascular: Negative for chest pain and palpitations.  Gastrointestinal: Positive for abdominal pain. Negative for blood in stool, constipation, diarrhea, nausea and vomiting.       Abdominal pain significantly improved  Genitourinary: Negative for dysuria and urgency.  All other  systems reviewed and are negative.   VITAL SIGNS:  Temp:  [97.9 F (36.6 C)-98.2 F (36.8 C)] 97.9 F (36.6 C) (01/15 0433) Pulse Rate:  [70-82] 76 (01/15 1145) Resp:  [16-18] 16 (01/15 1145) BP: (130-156)/(75-89) 156/89 (01/15 1145) SpO2:  [91 %-98 %] 95 % (01/15 1145)     Height: 5\' 4"  (162.6 cm) Weight: 104.3 kg BMI (Calculated): 39.46   PHYSICAL EXAM:  Physical Exam Vitals and nursing note reviewed.  Constitutional:      General: She is not in acute distress.    Appearance: She is well-developed. She is obese. She is  not ill-appearing.  HENT:     Head: Normocephalic and atraumatic.  Eyes:     General: Scleral icterus present.     Extraocular Movements: Extraocular movements intact.  Cardiovascular:     Rate and Rhythm: Normal rate and regular rhythm.     Heart sounds: Normal heart sounds. No murmur heard.   Pulmonary:     Effort: Pulmonary effort is normal. No respiratory distress.  Abdominal:     General: A surgical scar is present.     Palpations: Abdomen is soft.     Tenderness: There is abdominal tenderness in the right upper quadrant and epigastric area. There is no guarding or rebound. Negative signs include Murphy's sign.     Comments: Abdomen is obese, soft, she does have tenderness in the epigastrium and RUQ, this is more mild when distracted, non-distended, she does have previous laparoscopic scars seen.   Genitourinary:    Comments: Deferred Skin:    General: Skin is warm and dry.     Coloration: Skin is not jaundiced or pale.  Neurological:     General: No focal deficit present.     Mental Status: She is alert and oriented to person, place, and time.  Psychiatric:        Mood and Affect: Mood normal.        Behavior: Behavior normal.     INTAKE/OUTPUT:  This shift: Total I/O In: -  Out: 700 [Urine:700]  Last 2 shifts: @IOLAST2SHIFTS @  Labs:  CBC Latest Ref Rng & Units 05/02/2020 05/01/2020 11/06/2019  WBC 4.0 - 10.5 K/uL 5.8 6.4 9.4  Hemoglobin  12.0 - 15.0 g/dL 13.2 14.1 14.8  Hematocrit 36.0 - 46.0 % 39.3 41.6 44.1  Platelets 150 - 400 K/uL 235 265 307   CMP Latest Ref Rng & Units 05/02/2020 05/01/2020 12/31/2019  Glucose 70 - 99 mg/dL 100(H) 136(H) 103(H)  BUN 6 - 20 mg/dL 12 7 11   Creatinine 0.44 - 1.00 mg/dL 1.02(H) 0.92 0.89  Sodium 135 - 145 mmol/L 139 139 135  Potassium 3.5 - 5.1 mmol/L 3.8 3.8 4.8  Chloride 98 - 111 mmol/L 106 103 96  CO2 22 - 32 mmol/L 21(L) 24 26  Calcium 8.9 - 10.3 mg/dL 8.4(L) 8.9 9.8  Total Protein 6.5 - 8.1 g/dL 6.3(L) 7.9 6.9  Total Bilirubin 0.3 - 1.2 mg/dL 2.2(H) 3.7(H) 0.3  Alkaline Phos 38 - 126 U/L 128(H) 164(H) 123(H)  AST 15 - 41 U/L 215(H) 422(H) 77(H)  ALT 0 - 44 U/L 436(H) 611(H) 109(H)     Imaging studies:   RUQ [place] (05/01/2020) personally reviewed showing sludge/choleltihiaisi with gallbladder wall thickening concerning for possible cholecystitis, CBD normal diameter, and radiologist report reviewed:  IMPRESSION: 1. Suspicious for Acute Cholecystitis: Abundant dependent gravel type gallstones throughout the gallbladder which appears mildly dilated. Wall thickness is at the upper limits of normal and a positive sonographic Murphy sign was elicited. 2. Echogenic liver with suggestion of nodular liver contour, suggesting fatty liver disease with cirrhosis. 3. No bile duct enlargement.   Assessment/Plan: (ICD-10's: K85.10) 50 y.o. female with RUQ/Epigastric abdominal pain found to have hyperbilirubinemia and elevated lipase levels most likely attributable to gallstone pancreatitis, there is some question of concomitant cholecystitis however I think this is less likely given her normal leukocytosis and changes on [place] may better represent chronic cholecystitis.    -Negative MRCP, pancreatitis improving  -Plan for robot-assisted laparoscopic cholecystectomy tomorrow; risks of procedure discussed in detail.  -We will give sips of clears today,  n.p.o. after midnight, continue IVF  Resuscitation  -Continue antibiotics  - Monitor abdominal examination; on-going bowel function  - Pain control prn; antiemetics prn  - Morning labs: CBC, CMP, lipase  - Mobilization as tolerated  - Ordered home medications   - DVT prophylaxis; hold for OR tomorrow.  All of the above findings and recommendations were discussed with the patient, and all of her questions were answered to her expressed satisfaction.

## 2020-05-02 NOTE — Progress Notes (Signed)
PHARMACY-PROVIDER COMMUNICATION   T Bili >2 with liver impairment, max promethazine IV dose is 6.25 mg. Sickle cell patients are excluded  Promethazine dose has been adjusted.   Pernell Dupre, PharmD, BCPS Clinical Pharmacist 05/02/2020 12:19 PM

## 2020-05-03 ENCOUNTER — Inpatient Hospital Stay: Payer: 59 | Admitting: Anesthesiology

## 2020-05-03 ENCOUNTER — Encounter
Admission: EM | Disposition: A | Discharge status: Home / Self Care | Payer: Self-pay | Source: Home / Self Care | Attending: General Surgery

## 2020-05-03 DIAGNOSIS — K8010 Calculus of gallbladder with chronic cholecystitis without obstruction: Secondary | ICD-10-CM

## 2020-05-03 LAB — CBC
HCT: 36.5 % (ref 36.0–46.0)
Hemoglobin: 12.3 g/dL (ref 12.0–15.0)
MCH: 32.2 pg (ref 26.0–34.0)
MCHC: 33.7 g/dL (ref 30.0–36.0)
MCV: 95.5 fL (ref 80.0–100.0)
Platelets: 218 10*3/uL (ref 150–400)
RBC: 3.82 MIL/uL — ABNORMAL LOW (ref 3.87–5.11)
RDW: 12.8 % (ref 11.5–15.5)
WBC: 4.7 10*3/uL (ref 4.0–10.5)
nRBC: 0 % (ref 0.0–0.2)

## 2020-05-03 LAB — COMPREHENSIVE METABOLIC PANEL
ALT: 288 U/L — ABNORMAL HIGH (ref 0–44)
AST: 100 U/L — ABNORMAL HIGH (ref 15–41)
Albumin: 3 g/dL — ABNORMAL LOW (ref 3.5–5.0)
Alkaline Phosphatase: 115 U/L (ref 38–126)
Anion gap: 7 (ref 5–15)
BUN: 10 mg/dL (ref 6–20)
CO2: 25 mmol/L (ref 22–32)
Calcium: 8.4 mg/dL — ABNORMAL LOW (ref 8.9–10.3)
Chloride: 107 mmol/L (ref 98–111)
Creatinine, Ser: 1.01 mg/dL — ABNORMAL HIGH (ref 0.44–1.00)
GFR, Estimated: >60 mL/min (ref >60)
Glucose, Bld: 112 mg/dL — ABNORMAL HIGH (ref 70–99)
Potassium: 3.7 mmol/L (ref 3.5–5.1)
Sodium: 139 mmol/L (ref 135–145)
Total Bilirubin: 1.5 mg/dL — ABNORMAL HIGH (ref 0.3–1.2)
Total Protein: 5.8 g/dL — ABNORMAL LOW (ref 6.5–8.1)

## 2020-05-03 LAB — MAGNESIUM: Magnesium: 2 mg/dL (ref 1.7–2.4)

## 2020-05-03 LAB — PHOSPHORUS: Phosphorus: 2.8 mg/dL (ref 2.5–4.6)

## 2020-05-03 LAB — LIPASE, BLOOD: Lipase: 75 U/L — ABNORMAL HIGH (ref 11–51)

## 2020-05-03 SURGERY — CHOLECYSTECTOMY, ROBOT-ASSISTED, LAPAROSCOPIC
Anesthesia: General | Site: Abdomen

## 2020-05-03 MED ORDER — MIDAZOLAM HCL 2 MG/2ML IJ SOLN
INTRAMUSCULAR | Status: AC
  Filled 2020-05-03: qty 2

## 2020-05-03 MED ORDER — FENTANYL CITRATE (PF) 100 MCG/2ML IJ SOLN
INTRAMUSCULAR | Status: AC
  Filled 2020-05-03: qty 2

## 2020-05-03 MED ORDER — DEXAMETHASONE SODIUM PHOSPHATE 10 MG/ML IJ SOLN
INTRAMUSCULAR | Status: DC | PRN
  Administered 2020-05-03: 10 mg via INTRAVENOUS

## 2020-05-03 MED ORDER — ONDANSETRON HCL 4 MG/2ML IJ SOLN: 4.0000 mg | Freq: Once | INTRAMUSCULAR | Status: AC | PRN

## 2020-05-03 MED ORDER — ONDANSETRON HCL 4 MG/2ML IJ SOLN
INTRAMUSCULAR | Status: DC | PRN
  Administered 2020-05-03: 4 mg via INTRAVENOUS

## 2020-05-03 MED ORDER — FENTANYL CITRATE (PF) 100 MCG/2ML IJ SOLN
25.0000 ug | INTRAMUSCULAR | Status: DC | PRN
  Administered 2020-05-03 (×6): 25 ug via INTRAVENOUS

## 2020-05-03 MED ORDER — SCOPOLAMINE 1 MG/3DAYS TD PT72
1.0000 | MEDICATED_PATCH | TRANSDERMAL | Status: DC
  Filled 2020-05-03: qty 1

## 2020-05-03 MED ORDER — INDOCYANINE GREEN 25 MG IV SOLR
7.5000 mg | Freq: Once | INTRAVENOUS | Status: AC
  Administered 2020-05-03: 7.5 mg via INTRAVENOUS
  Filled 2020-05-03: qty 3

## 2020-05-03 MED ORDER — ROCURONIUM BROMIDE 100 MG/10ML IV SOLN
INTRAVENOUS | Status: DC | PRN
  Administered 2020-05-03: 40 mg via INTRAVENOUS
  Administered 2020-05-03: 20 mg via INTRAVENOUS

## 2020-05-03 MED ORDER — CYCLOBENZAPRINE HCL 10 MG PO TABS
5.0000 mg | ORAL_TABLET | Freq: Three times a day (TID) | ORAL | Status: DC | PRN
  Administered 2020-05-03 – 2020-05-04 (×3): 5 mg via ORAL
  Filled 2020-05-03 (×3): qty 1

## 2020-05-03 MED ORDER — SCOPOLAMINE 1 MG/3DAYS TD PT72
MEDICATED_PATCH | TRANSDERMAL | Status: AC
  Administered 2020-05-03: 1.5 mg via TRANSDERMAL
  Filled 2020-05-03: qty 1

## 2020-05-03 MED ORDER — DIAZEPAM 5 MG PO TABS
5.0000 mg | ORAL_TABLET | Freq: Once | ORAL | Status: AC
  Administered 2020-05-03: 5 mg via ORAL
  Filled 2020-05-03: qty 1

## 2020-05-03 MED ORDER — ACETAMINOPHEN 10 MG/ML IV SOLN
INTRAVENOUS | Status: DC | PRN
  Administered 2020-05-03: 1000 mg via INTRAVENOUS

## 2020-05-03 MED ORDER — MIDAZOLAM HCL 2 MG/2ML IJ SOLN
INTRAMUSCULAR | Status: DC | PRN
  Administered 2020-05-03: 2 mg via INTRAVENOUS

## 2020-05-03 MED ORDER — SODIUM CHLORIDE 0.9 % IV SOLN
INTRAVENOUS | Status: DC | PRN
  Administered 2020-05-03: 1 g via INTRAVENOUS

## 2020-05-03 MED ORDER — PHENYLEPHRINE HCL (PRESSORS) 10 MG/ML IV SOLN
INTRAVENOUS | Status: DC | PRN
  Administered 2020-05-03 (×2): 100 ug via INTRAVENOUS

## 2020-05-03 MED ORDER — SUGAMMADEX SODIUM 200 MG/2ML IV SOLN
INTRAVENOUS | Status: DC | PRN
  Administered 2020-05-03: 300 mg via INTRAVENOUS

## 2020-05-03 MED ORDER — PROPOFOL 10 MG/ML IV BOLUS
INTRAVENOUS | Status: DC | PRN
  Administered 2020-05-03: 200 mg via INTRAVENOUS

## 2020-05-03 MED ORDER — LACTATED RINGERS IV SOLN: INTRAVENOUS | Status: DC | PRN

## 2020-05-03 MED ORDER — LIDOCAINE HCL (CARDIAC) PF 100 MG/5ML IV SOSY
PREFILLED_SYRINGE | INTRAVENOUS | Status: DC | PRN
  Administered 2020-05-03: 100 mg via INTRAVENOUS

## 2020-05-03 MED ORDER — KETOROLAC TROMETHAMINE 30 MG/ML IJ SOLN
INTRAMUSCULAR | Status: DC | PRN
  Administered 2020-05-03: 30 mg via INTRAVENOUS

## 2020-05-03 MED ORDER — FENTANYL CITRATE (PF) 100 MCG/2ML IJ SOLN
INTRAMUSCULAR | Status: DC | PRN
  Administered 2020-05-03 (×2): 50 ug via INTRAVENOUS

## 2020-05-03 MED ORDER — EPHEDRINE SULFATE 50 MG/ML IJ SOLN
INTRAMUSCULAR | Status: DC | PRN
  Administered 2020-05-03: 10 mg via INTRAVENOUS

## 2020-05-03 MED ORDER — LIDOCAINE-EPINEPHRINE 1 %-1:100000 IJ SOLN
INTRAMUSCULAR | Status: DC | PRN
  Administered 2020-05-03: 20 mL

## 2020-05-03 MED ORDER — ONDANSETRON HCL 4 MG/2ML IJ SOLN
INTRAMUSCULAR | Status: AC
  Administered 2020-05-03: 4 mg via INTRAVENOUS
  Filled 2020-05-03: qty 2

## 2020-05-03 SURGICAL SUPPLY — 57 items
BLADE SURG SZ11 CARB STEEL (BLADE) ×2 IMPLANT
CANISTER SUCT 1200ML W/VALVE (MISCELLANEOUS) ×2 IMPLANT
CANNULA REDUC XI 12-8 STAPL (CANNULA) ×1
CANNULA REDUCER 12-8 DVNC XI (CANNULA) ×1 IMPLANT
CHLORAPREP W/TINT 26 (MISCELLANEOUS) ×2 IMPLANT
CLIP VESOLOCK MED LG 6/CT (CLIP) ×2 IMPLANT
COVER TIP SHEARS 8 DVNC (MISCELLANEOUS) ×1 IMPLANT
COVER TIP SHEARS 8MM DA VINCI (MISCELLANEOUS) ×1
COVER WAND RF STERILE (DRAPES) ×2 IMPLANT
DECANTER SPIKE VIAL GLASS SM (MISCELLANEOUS) ×4 IMPLANT
DEFOGGER SCOPE WARMER CLEARIFY (MISCELLANEOUS) ×2 IMPLANT
DERMABOND ADVANCED (GAUZE/BANDAGES/DRESSINGS) ×1
DERMABOND ADVANCED .7 DNX12 (GAUZE/BANDAGES/DRESSINGS) ×1 IMPLANT
DRAPE ARM DVNC X/XI (DISPOSABLE) ×5 IMPLANT
DRAPE COLUMN DVNC XI (DISPOSABLE) ×1 IMPLANT
DRAPE DA VINCI XI ARM (DISPOSABLE) ×5
DRAPE DA VINCI XI COLUMN (DISPOSABLE) ×1
ELECT CAUTERY BLADE TIP 2.5 (TIP) ×2
ELECT REM PT RETURN 9FT ADLT (ELECTROSURGICAL) ×2
ELECTRODE CAUTERY BLDE TIP 2.5 (TIP) ×1 IMPLANT
ELECTRODE REM PT RTRN 9FT ADLT (ELECTROSURGICAL) ×1 IMPLANT
GLOVE INDICATOR 7.0 STRL GRN (GLOVE) ×4 IMPLANT
GLOVE SURG ENC MOIS LTX SZ6.5 (GLOVE) ×4 IMPLANT
GOWN STRL REUS W/ TWL LRG LVL3 (GOWN DISPOSABLE) ×3 IMPLANT
GOWN STRL REUS W/TWL LRG LVL3 (GOWN DISPOSABLE) ×3
GRASPER SUT TROCAR 14GX15 (MISCELLANEOUS) ×2 IMPLANT
IRRIGATOR SUCT 8 DISP DVNC XI (IRRIGATION / IRRIGATOR) ×2 IMPLANT
IRRIGATOR SUCTION 8MM XI DISP (IRRIGATION / IRRIGATOR) ×2
IV NS 1000ML (IV SOLUTION) ×1
IV NS 1000ML BAXH (IV SOLUTION) ×1 IMPLANT
KIT PINK PAD W/HEAD ARE REST (MISCELLANEOUS) ×2
KIT PINK PAD W/HEAD ARM REST (MISCELLANEOUS) ×1 IMPLANT
LABEL OR SOLS (LABEL) ×2 IMPLANT
MANIFOLD NEPTUNE II (INSTRUMENTS) ×2 IMPLANT
NEEDLE HYPO 22GX1.5 SAFETY (NEEDLE) ×2 IMPLANT
NEEDLE INSUFFLATION 14GA 120MM (NEEDLE) ×2 IMPLANT
NS IRRIG 500ML POUR BTL (IV SOLUTION) ×2 IMPLANT
OBTURATOR OPTICAL STANDARD 8MM (TROCAR) ×1
OBTURATOR OPTICAL STND 8 DVNC (TROCAR) ×1
OBTURATOR OPTICALSTD 8 DVNC (TROCAR) ×1 IMPLANT
PACK LAP CHOLECYSTECTOMY (MISCELLANEOUS) ×2 IMPLANT
PENCIL ELECTRO HAND CTR (MISCELLANEOUS) ×2 IMPLANT
POUCH SPECIMEN RETRIEVAL 10MM (ENDOMECHANICALS) ×2 IMPLANT
SEAL CANN UNIV 5-8 DVNC XI (MISCELLANEOUS) ×3 IMPLANT
SEAL XI 5MM-8MM UNIVERSAL (MISCELLANEOUS) ×3
SET TUBE SMOKE EVAC HIGH FLOW (TUBING) ×2 IMPLANT
SOLUTION ELECTROLUBE (MISCELLANEOUS) ×2 IMPLANT
STAPLER CANNULA SEAL DVNC XI (STAPLE) ×1 IMPLANT
STAPLER CANNULA SEAL XI (STAPLE) ×1
STRIP CLOSURE SKIN 1/2X4 (GAUZE/BANDAGES/DRESSINGS) ×2 IMPLANT
SUT MNCRL 4-0 (SUTURE) ×1
SUT MNCRL 4-0 27XMFL (SUTURE) ×1
SUT VIC AB 3-0 SH 27 (SUTURE) ×1
SUT VIC AB 3-0 SH 27X BRD (SUTURE) ×1 IMPLANT
SUT VICRYL 0 AB UR-6 (SUTURE) ×2 IMPLANT
SUTURE MNCRL 4-0 27XMF (SUTURE) ×1 IMPLANT
TROCAR XCEL NON-BLD 5MMX100MML (ENDOMECHANICALS) IMPLANT

## 2020-05-03 NOTE — Anesthesia Preprocedure Evaluation (Addendum)
Anesthesia Evaluation  Patient identified by MRN, date of birth, ID band Patient awake    Reviewed: Allergy & Precautions, NPO status , Patient's Chart, lab work & pertinent test results  History of Anesthesia Complications (+) PONV and history of anesthetic complications  Airway Mallampati: III       Dental   Pulmonary neg sleep apnea, Not current smoker,           Cardiovascular hypertension (no meds), (-) Past MI (-) dysrhythmias (-) Valvular Problems/Murmurs     Neuro/Psych neg Seizures Anxiety Depression    GI/Hepatic Neg liver ROS, GERD  Medicated and Controlled,  Endo/Other  neg diabetesMorbid obesity  Renal/GU negative Renal ROS     Musculoskeletal   Abdominal   Peds  Hematology  (+) anemia ,   Anesthesia Other Findings   Reproductive/Obstetrics                             Anesthesia Physical Anesthesia Plan  ASA: III and emergent  Anesthesia Plan: General   Post-op Pain Management:    Induction: Intravenous  PONV Risk Score and Plan: 4 or greater and Ondansetron and Dexamethasone  Airway Management Planned: Oral ETT  Additional Equipment:   Intra-op Plan:   Post-operative Plan:   Informed Consent: I have reviewed the patients History and Physical, chart, labs and discussed the procedure including the risks, benefits and alternatives for the proposed anesthesia with the patient or authorized representative who has indicated his/her understanding and acceptance.       Plan Discussed with:   Anesthesia Plan Comments:        Anesthesia Quick Evaluation

## 2020-05-03 NOTE — Transfer of Care (Signed)
Immediate Anesthesia Transfer of Care Note  Patient: Patricia Bauer  Procedure(s) Performed: XI ROBOTIC ASSISTED LAPAROSCOPIC CHOLECYSTECTOMY (N/A Abdomen)  Patient Location: PACU  Anesthesia Type:General  Level of Consciousness: awake, alert  and oriented  Airway & Oxygen Therapy: Patient Spontanous Breathing and Patient connected to face mask oxygen  Post-op Assessment: Post -op Vital signs reviewed and stable  Post vital signs: stable  Last Vitals:  Vitals Value Taken Time  BP    Temp    Pulse 86 05/03/20 1512  Resp 21 05/03/20 1512  SpO2 98 % 05/03/20 1512  Vitals shown include unvalidated device data.  Last Pain:  Vitals:   05/03/20 0818  TempSrc: Oral  PainSc:          Complications: No complications documented.

## 2020-05-03 NOTE — Op Note (Signed)
Operative note  Pre-operative Diagnosis: Gallstone pancreatitis  Post-operative Diagnosis: Same, extensive cholelithiasis  Procedure: Robot assisted laparoscopic cholecystectomy  Surgeon: Fredirick Maudlin, MD  Anesthesia: GETA  Findings: Normal biliary anatomy, gallbladder distended with innumerable stones  Estimated Blood Loss: 5 cc cc       Specimens: Gallbladder           Complications: none immediately apparent  Procedure In Detail: The patient was seen again in the holding room. The benefits, complications, treatment options, and expected outcomes were discussed with the patient. The risks of bleeding, infection, recurrence of symptoms, failure to resolve symptoms, bile duct damage, bile duct leak, retained common bile duct stone, bowel injury, any of which could require further surgery and/or ERCP, stent, or papillotomy were reviewed with the patient. The likelihood of improving the patient's symptoms with return to their baseline status is good.  The patient and/or family concurred with the proposed plan, giving informed consent.  The patient was taken to operating room, identified and the procedure verified as laparoscopic cholecystectomy.  A time out was held and the above information confirmed.  Prior to the induction of general anesthesia, antibiotic prophylaxis was administered. VTE prophylaxis was in place. General endotracheal anesthesia was then administered and tolerated well. After the induction, the abdomen was prepped with Chloraprep and draped in the sterile fashion. The patient was positioned in the supine position.  Optiview technique was used to enter the abdomen in the right upper quadrant via a standard 5 mm laparoscopic trocar.  Pneumoperitoneum was then created with CO2 and tolerated well without any adverse changes in the patient's vital signs.  A 12 mm robotic trocar along with three 8-mm robotic trochars were placed under direct vision.  All skin incisions were  infiltrated with a local anesthetic agent before making the incision and placing the trocars.   The patient was positioned in 15 degrees of reverse Trendelenburg and tilted 10 degrees to the left.  The robot was brought to the surgical field and docked in the standard fashion.  We made sure all the instrumentation was kept in direct view at all times and that there were no collision between the arms.  I scrubbed out and went to the console.  The gallbladder was identified, the fundus grasped and retracted cephalad. Adhesions were lysed bluntly. The infundibulum was grasped and retracted laterally, exposing the peritoneum overlying the triangle of Calot. This was then divided and exposed in a blunt fashion. An extended critical view of the cystic duct and cystic artery was obtained.  The cystic duct was clearly identified and bluntly dissected away from the surrounding tissues, as was the cystic artery.  Using ICG cholangiography we visualized the cystic duct and confirmed that there was no aberrant biliary ductal anatomy nor any evidence of bile duct injury.  Both cystic duct and cystic artery were clipped and divided. The gallbladder was taken from the gallbladder fossa in a retrograde fashion with the electrocautery. Hemostasis was achieved with the electrocautery. Inspection of the right upper quadrant was performed. No bleeding, bile duct injury or leak, or bowel injury was noted.  The gallbladder was then placed in an Endopouch bag. The robotic instruments were removed and robotic arms were undocked in the standard fashion.    I scrubbed back in.  The gallbladder was removed via the 12 mm trocar site.  Using the PMI and a Carter-Thomason cone, the 12 mm port site was then closed with interrupted 0 Vicryl sutures.  The remaining 8  mm ports were removed and pneumoperitoneum was released.  The 12 mm port site was closed with deep dermal 3-0 Vicryl.  4-0 subcuticular Monocryl was used to close the skin  of all the ports. Dermabond was applied, followed by Steri-Strips.  The patient was then awakened, extubated, and taken to the postanesthesia recovery unit in stable condition.   Sponge, lap, and needle counts were reported to be correct number at closure and at the conclusion of the case.          Fredirick Maudlin, MD FACS

## 2020-05-03 NOTE — Anesthesia Postprocedure Evaluation (Signed)
Anesthesia Post Note  Patient: Patricia Bauer  Procedure(s) Performed: XI ROBOTIC ASSISTED LAPAROSCOPIC CHOLECYSTECTOMY (N/A Abdomen)  Patient location during evaluation: PACU Anesthesia Type: General Level of consciousness: awake Pain management: pain level controlled Vital Signs Assessment: post-procedure vital signs reviewed and stable Respiratory status: spontaneous breathing, respiratory function stable and patient connected to face mask oxygen Cardiovascular status: blood pressure returned to baseline and stable Postop Assessment: no headache and no apparent nausea or vomiting Anesthetic complications: no   No complications documented.   Last Vitals:  Vitals:   05/03/20 0733 05/03/20 0818  BP: (!) 148/86 (!) 145/84  Pulse: 64 67  Resp: 20 20  Temp: 37 C 36.8 C  SpO2: 97% 97%    Last Pain:  Vitals:   05/03/20 0818  TempSrc: Oral  PainSc:                  Johnnye Lana

## 2020-05-03 NOTE — Anesthesia Procedure Notes (Signed)
Procedure Name: Intubation Performed by: Johnnye Lana, CRNA Pre-anesthesia Checklist: Patient identified, Patient being monitored, Timeout performed, Emergency Drugs available and Suction available Patient Re-evaluated:Patient Re-evaluated prior to induction Oxygen Delivery Method: Circle system utilized Preoxygenation: Pre-oxygenation with 100% oxygen Induction Type: IV induction Ventilation: Mask ventilation without difficulty Laryngoscope Size: 3 and McGraph Grade View: Grade I Tube type: Oral Tube size: 7.0 mm Number of attempts: 1 Airway Equipment and Method: Stylet Placement Confirmation: ETT inserted through vocal cords under direct vision,  positive ETCO2 and breath sounds checked- equal and bilateral Secured at: 21 cm Tube secured with: Tape Dental Injury: Teeth and Oropharynx as per pre-operative assessment

## 2020-05-04 LAB — COMPREHENSIVE METABOLIC PANEL
ALT: 216 U/L — ABNORMAL HIGH (ref 0–44)
AST: 90 U/L — ABNORMAL HIGH (ref 15–41)
Albumin: 2.8 g/dL — ABNORMAL LOW (ref 3.5–5.0)
Alkaline Phosphatase: 101 U/L (ref 38–126)
Anion gap: 8 (ref 5–15)
BUN: 9 mg/dL (ref 6–20)
CO2: 23 mmol/L (ref 22–32)
Calcium: 7.8 mg/dL — ABNORMAL LOW (ref 8.9–10.3)
Chloride: 106 mmol/L (ref 98–111)
Creatinine, Ser: 0.84 mg/dL (ref 0.44–1.00)
GFR, Estimated: >60 mL/min (ref >60)
Glucose, Bld: 110 mg/dL — ABNORMAL HIGH (ref 70–99)
Potassium: 3.9 mmol/L (ref 3.5–5.1)
Sodium: 137 mmol/L (ref 135–145)
Total Bilirubin: 1.1 mg/dL (ref 0.3–1.2)
Total Protein: 5.9 g/dL — ABNORMAL LOW (ref 6.5–8.1)

## 2020-05-04 LAB — MAGNESIUM: Magnesium: 1.8 mg/dL (ref 1.7–2.4)

## 2020-05-04 LAB — CBC
HCT: 35.6 % — ABNORMAL LOW (ref 36.0–46.0)
Hemoglobin: 12.3 g/dL (ref 12.0–15.0)
MCH: 32.3 pg (ref 26.0–34.0)
MCHC: 34.6 g/dL (ref 30.0–36.0)
MCV: 93.4 fL (ref 80.0–100.0)
Platelets: 230 10*3/uL (ref 150–400)
RBC: 3.81 MIL/uL — ABNORMAL LOW (ref 3.87–5.11)
RDW: 12.6 % (ref 11.5–15.5)
WBC: 8.4 10*3/uL (ref 4.0–10.5)
nRBC: 0 % (ref 0.0–0.2)

## 2020-05-04 LAB — LIPASE, BLOOD: Lipase: 39 U/L (ref 11–51)

## 2020-05-04 LAB — PHOSPHORUS: Phosphorus: 3.3 mg/dL (ref 2.5–4.6)

## 2020-05-04 MED ORDER — IBUPROFEN 800 MG PO TABS: 800.0000 mg | ORAL_TABLET | Freq: Three times a day (TID) | ORAL | 0 refills | Status: DC | PRN

## 2020-05-04 MED ORDER — CYCLOBENZAPRINE HCL 5 MG PO TABS: 5.0000 mg | ORAL_TABLET | Freq: Three times a day (TID) | ORAL | 0 refills | Status: DC | PRN

## 2020-05-04 MED ORDER — OXYCODONE HCL 5 MG PO TABS: 5.0000 mg | ORAL_TABLET | Freq: Four times a day (QID) | ORAL | 0 refills | Status: DC | PRN

## 2020-05-04 MED ORDER — MENTHOL 3 MG MT LOZG
1.0000 | LOZENGE | OROMUCOSAL | Status: DC | PRN
  Filled 2020-05-04 (×2): qty 9

## 2020-05-04 NOTE — Discharge Summary (Addendum)
[facility] SURGICAL DISCHARGE SUMMARY  [name] MRN: [id number] DOB/AGE: 1970/08/13 50 y.o.  Admit date: 05/01/2020 Discharge date: 05/04/2020  Discharge Diagnoses Patient Active Problem List   Diagnosis Date Noted   Gallstone pancreatitis 05/01/2020    Consultants None  Procedures 05/04/2019:  Robotic assisted laparoscopic cholecystectomy  HPI: 50 y.o. female presented to [facility] ED for abdominal pain. Patient reports about 24-48 hour of right sided abdominal pain which radiates through to he back. This was crampy and sharp in nature. She initially thought this was related to GERD symptoms and was started on PPI by her primary care doctor. However, her abdominal pain continued to progressively worsen throughout the last day or so. This was rated at a 9/10 at the worst, and was exacerbated by positional changes. She reports a subjective fever at home as well as nausea and emesis. No fever, chills, cough, congestion, SOB, CP, or bowel changes. She has noticed her urine is darker in color. She denied any history of similar presentation in the past. Only previous intra-abdominal surgery was laparoscopic hysterectomy. Work up in ED, revealed a normal WBC at 6.4k, elevated lipase to 1340, and hyperbilirubinemia to 3.7. She did have RUQ [place] which showed sludge and cholelithiasis with some wall thickening.   Hospital Course: Patient was admitted to the general surgery service and underwent MRCP given her hyperbilirubinemia which was negative for choledocholithiasis. Her pain improved on HD1. Informed consent was obtained and documented, and patient underwent uneventful robotic assisted laparoscopic cholecystectomy (Dr Celine Ahr, 05/03/2020).  Post-operatively, patient's pain and symptoms improved/resolved and advancement of patient's diet and ambulation were well-tolerated. The remainder of patient's hospital course was essentially unremarkable, and discharge planning  was initiated accordingly with patient safely able to be discharged home with appropriate discharge instructions, pain control, and outpatient follow-up after all of her questions were answered to her expressed satisfaction.   Discharge Condition: Good    Physical Examination:  Constitutional: Well appearing female, NAD Pulmonary: Normal effort, no respiratory distress Gastrointestinal: Soft, incisional soreness, non-distended, no rebound/guarding Skin: Laparoscopic incisions are CDI with steri-strips, no erythema or drainage    Allergies as of 05/04/2020       Reactions   Ibuprofen Swelling   Bilateral swelling of ankles and feet   Diflucan [fluconazole] Other (See Comments)   Pelvic pain   Onion Other (See Comments)   migraines    Topamax [topiramate] Other (See Comments)   Reaction:  Body aches    Omeprazole    Hydrocodone Anxiety, Palpitations, Other (See Comments)   Reaction:  Migraines         Medication List     TAKE these medications    ALPRAZolam 0.25 MG tablet Commonly known as: XANAX TAKE 1 TABLET(0.25 MG) BY MOUTH DAILY AS NEEDED FOR ANXIETY OR SLEEP   citalopram 40 MG tablet Commonly known as: CELEXA Take 1 tablet (40 mg total) by mouth daily.   cyclobenzaprine 5 MG tablet Commonly known as: FLEXERIL Take 1 tablet (5 mg total) by mouth 3 (three) times daily as needed for muscle spasms.   guaiFENesin-codeine 100-10 MG/5ML syrup Take 10 mLs by mouth 3 (three) times daily as needed for cough.   ibuprofen 800 MG tablet Commonly known as: ADVIL Take 1 tablet (800 mg total) by mouth every 8 (eight) hours as needed.   oxyCODONE 5 MG immediate release tablet Commonly known as: Oxy IR/ROXICODONE Take 1 tablet (5 mg total) by mouth every 6 (six) hours as needed for  severe pain or breakthrough pain.   pantoprazole 40 MG tablet Commonly known as: PROTONIX Take 1 tablet (40 mg total) by mouth daily.   SUMAtriptan 50 MG tablet Commonly known as:  IMITREX TAKE 1 TABLET MAY REPEAT IN 2 HOURS IF HEADACHE PERSISTS OR RECURS. DON'T USE MORE THAN TWO IN 24HRS   traMADol 50 MG tablet Commonly known as: ULTRAM Take 1 tablet (50 mg total) by mouth every 6 (six) hours as needed.   venlafaxine 37.5 MG tablet Commonly known as: EFFEXOR Take 37.5 mg by mouth 2 (two) times daily.          Follow-up Information     Tylene Fantasia, PA-C. Schedule an appointment as soon as possible for a visit in 2 week(s).   Specialty: Physician Assistant Why: 2 week follow up, s/p cholecystectomy  Contact information: [address] [place] [address] [phone]                  Time spent on discharge management including discussion of hospital course, clinical condition, outpatient instructions, prescriptions, and follow up with the patient and members of the medical team: >30 minutes  -- Edison Simon , PA-C  [facility]  05/04/2020, 8:34 AM [phone] M-F: 7am - 4pm  I saw and evaluated the patient.  I agree with the above documentation, exam, and plan, which I have edited where appropriate. Fredirick Maudlin  8:58 AM

## 2020-05-04 NOTE — Progress Notes (Signed)
Patient said that she feels something like skin flapping in her throat. I noted that her uvula was stretched and red and the back of her throat was red. I did notify Dr Celine Ahr and Dr Amie Critchley with anesthesia. Order received from Dr Celine Ahr for cepacol

## 2020-05-04 NOTE — Progress Notes (Signed)
Physical Therapy Evaluation Patient Details Name: Patricia Bauer MRN: [id number] DOB: 10-29-1970 Today's Date: 05/04/2020   History of Present Illness  50 y.o. female presented to [facility] ED for abdominal pain. Patient reports about 24-48 hour of right sided abdominal pain which radiates through to he back. This was crampy and sharp in nature. She initially thought this was related to GERD symptoms and was started on PPI by her primary care doctor. However, her abdominal pain continued to progressively worsen throughout the last day or so. This was rated at a 9/10 at the worst, and was exacerbated by positional changes. She reported a subjective fever at home as well as nausea and emesis. No fever, chills, cough, congestion, SOB, CP, or bowel changes. She has noticed her urine is darker in color. She denied any history of similar presentation in the past. Only previous intra-abdominal surgery was laparoscopic hysterectomy. Work up in ED, revealed a normal WBC at 6.4k, elevated lipase to 1340, and hyperbilirubinemia to 3.7. She did have RUQ [place] which showed sludge and cholelithiasis with some wall thickening.   Patient underwent robotic assisted laparoscopic cholecystectomy on 05/04/2019.  PT evaluation performed on POD #1.  Upon arrival patient reporting considerable left lower quadrant pain of 3/10 at rest and 7/10 with activity.  She reports that she has been up to the bathroom once independently since her surgery but it was difficult.  Patient denies any bowel movement since prior to reporting to the ED.  She reports that her daughter and son-in-law are currently sick at home with COVID.  She lives with her daughter and son-in-law as well as 2 grandkids.  Clinical Impression  Patient moves slowly and is very guarded during bed mobility secondary to abdominal pain.  Uses bed rails but is able to come to seated position edge of bed without external assist from therapist.  She demonstrates safe hand placement  on bed and bed mildly elevated to assist secondary to reports of increased pain with movement.  No external assist given required by therapist.  Once in standing patient appears stable.  She is able to maintain her balance without upper extremity support.  She is able to ambulate partially around the nurse station and back with a rolling walker and supervision from therapist.  Gait is exceedingly slow with patient requiring 35 seconds to ambulate every 10 feet (0.29 ft/sec).  Gait is very guarded secondary to left lower quadrant pain.  No overt signs of instability and no external support required by therapist during gait.  Patient does stop intermittently throughout ambulation due to reported left lower quadrant spasms and pain.  Overall patient demonstrates adequate strength and balance for safe bed mobility, transfers and ambulation.  However she is very guarded secondary to her left lower quadrant abdominal pain and ambulates very slowly.  All the bedrooms in her home are on the second level and patient would likely not do well with stairs at this time secondary to her abdominal pain however she does report that she has a couch downstairs that she could sleep on as well as a half bath.  She reports that she has not had a bowel movement since prior to coming into the hospital.  At this time patient does not need any additional PT services but it is important that she continues with regular ambulation during her admission and after discharge in order to prevent further deconditioning and help stimulate her bowels.  No additional formal PT necessary.  Patient is safe with  respect to her mobility and balance to discharge home once medical team has determined she is medically stable for discharge.   Follow Up Recommendations No PT follow up    Equipment Recommendations  None recommended by PT    Recommendations for Other Services       Precautions / Restrictions Precautions Precautions:  None Restrictions Weight Bearing Restrictions: No      Mobility  Bed Mobility Overal bed mobility: Modified Independent             General bed mobility comments: Patient moves slowly and is very guarded secondary to abdominal pain.  Uses bed rails but is able to come to seated position edge of bed without external assist from therapist.    Transfers Overall transfer level: Modified independent Equipment used: None             General transfer comment: Patient with safe hand placement on bed and bed mildly elevated to assist secondary to reports of increased pain with movement.  No external assist required by therapist.  Once in standing patient appears stable.  She is able to maintain her balance without upper extremity support  Ambulation/Gait Ambulation/Gait assistance: Supervision Gait Distance (Feet): 150 Feet Assistive device: Rolling walker (2 wheeled)   Gait velocity: 0.29 ft/sec Gait velocity interpretation: <1.31 ft/sec, indicative of household ambulator General Gait Details: Patient is able to ambulate partially around the nurse station and back with a rolling walker and supervision from therapist.  Gait is exceedingly slow with patient requiring 35 seconds to ambulate every 10 feet (0.29 ft/sec).  Gait is very guarded secondary to left lower quadrant pain.  No overt signs of instability and no external support required by therapist during gait.  Patient does stop intermittently throughout ambulation due to reported left lower quadrant spasms and pain.  Stairs            Wheelchair Mobility    Modified Rankin (Stroke Patients Only)       Balance Overall balance assessment: Modified Independent;No apparent balance deficits (not formally assessed)                                           Pertinent Vitals/Pain Pain Assessment: 0-10 Pain Score: 3  (3/10 at rest, 7/10 with ambulation.) Pain Location: LLQ abdomen Pain Descriptors /  Indicators: Discomfort Pain Intervention(s): Monitored during session    Home Living Family/patient expects to be discharged to:: Private residence Living Arrangements: Children;Other (Comment) (Grandchildren) Available Help at Discharge: Family Type of Home: House Home Access: Level entry     Home Layout: Two level;Bed/bath upstairs Home Equipment: Crutches;Cane - single point      Prior Function Level of Independence: Independent         Comments: Patient reports independence with ADLs/IADLs.  Denies falls in the last 12 months.     Hand Dominance   Dominant Hand: Right    Extremity/Trunk Assessment   Upper Extremity Assessment Upper Extremity Assessment: Overall WFL for tasks assessed    Lower Extremity Assessment Lower Extremity Assessment: Overall WFL for tasks assessed (Hip flexion somewhat difficult to assess due to abdominal pain)       Communication   Communication: No difficulties  Cognition Arousal/Alertness: Awake/alert Behavior During Therapy: WFL for tasks assessed/performed Overall Cognitive Status: Within Functional Limits for tasks assessed  General Comments: AOx4      General Comments      Exercises     Assessment/Plan    PT Assessment Patent does not need any further PT services  PT Problem List         PT Treatment Interventions      PT Goals (Current goals can be found in the Care Plan section)  Acute Rehab PT Goals Patient Stated Goal: Return to prior level of function PT Goal Formulation: With patient Time For Goal Achievement: 05/18/20 Potential to Achieve Goals: Good    Frequency     Barriers to discharge        Co-evaluation               AM-PAC PT "6 Clicks" Mobility  Outcome Measure Help needed turning from your back to your side while in a flat bed without using bedrails?: None Help needed moving from lying on your back to sitting on the side of a flat bed  without using bedrails?: A Little Help needed moving to and from a bed to a chair (including a wheelchair)?: A Little Help needed standing up from a chair using your arms (e.g., wheelchair or bedside chair)?: None Help needed to walk in hospital room?: None Help needed climbing 3-5 steps with a railing? : A Little 6 Click Score: 21    End of Session Equipment Utilized During Treatment: Gait belt Activity Tolerance: Patient limited by pain Patient left: in bed;with call bell/phone within reach;with nursing/sitter in room Nurse Communication: Mobility status PT Visit Diagnosis: Difficulty in walking, not elsewhere classified (R26.2);Pain Pain - Right/Left: Left Pain - part of body:  (Left lower quadrant abdomen)    Time: [id number] PT Time Calculation (min) (ACUTE ONLY): 27 min   Charges:   PT Evaluation $PT Eval Low Complexity: 1 Low         Akai Dollard D Jahzir Strohmeier PT, DPT, GCS   Yana Schorr 05/04/2020, 1:23 PM

## 2020-05-04 NOTE — Discharge Instructions (Signed)
In addition to included general post-operative instructions for cholecystectomy,  Diet: Resume home diet. Recommend limiting or avoid fatty foods for the first few days after surgery. If you do eat these, you may (or may not) notice diarrhea. This is expected while your body is adjusting to not having a gallbladder and typically resolves with time.   Activity: No heavy lifting >20 pounds (children, pets, laundry, garbage) for 4 weeks, but light activity and walking are encouraged. Do not drive or drink alcohol if taking narcotic pain medications or having pain that might distract from driving.  Wound care: 2 days after surgery (01/18), you may shower/get incision wet with soapy water and pat dry (do not rub incisions), but no baths or submerging incision underwater until follow-up.   Medications: Resume all home medications. For mild to moderate pain: acetaminophen (Tylenol) or ibuprofen/naproxen (if no kidney disease). Combining Tylenol with alcohol can substantially increase your risk of causing liver disease. Narcotic pain medications, if prescribed, can be used for severe pain, though may cause nausea, constipation, and drowsiness. Do not combine Tylenol and Percocet (or similar) within a 6 hour period as Percocet (and similar) contain(s) Tylenol. If you do not need the narcotic pain medication, you do not need to fill the prescription.  Call office [phone] / [phone]) at any time if any questions, worsening pain, fevers/chills, bleeding, drainage from incision site, or other concerns.

## 2020-05-04 NOTE — Progress Notes (Signed)
Discharge instructions reviewed with the patient earlier today. IV's removed. Patient sent out via wheelchair with belongings

## 2020-05-05 LAB — SURGICAL PATHOLOGY

## 2020-05-07 ENCOUNTER — Ambulatory Visit: Payer: 59 | Admitting: Family Medicine

## 2020-05-08 ENCOUNTER — Ambulatory Visit: Payer: 59 | Admitting: Family

## 2020-05-08 ENCOUNTER — Other Ambulatory Visit: Payer: 59

## 2020-05-14 ENCOUNTER — Telehealth: Payer: Self-pay | Admitting: Family Medicine

## 2020-05-14 NOTE — Telephone Encounter (Signed)
Called patient in regards to FMLA being sent in. Unaware what for. LVM to call back to discuss.

## 2020-05-15 ENCOUNTER — Ambulatory Visit (INDEPENDENT_AMBULATORY_CARE_PROVIDER_SITE_OTHER): Payer: 59 | Admitting: Psychology

## 2020-05-15 DIAGNOSIS — F410 Panic disorder [episodic paroxysmal anxiety] without agoraphobia: Secondary | ICD-10-CM

## 2020-05-18 ENCOUNTER — Telehealth: Payer: Self-pay

## 2020-05-18 NOTE — Telephone Encounter (Signed)
Called pt and left a vm to r/s the 05/22/20 appt due to sarah being out of the office   [name]

## 2020-05-18 NOTE — Telephone Encounter (Signed)
Patient called in and informed what FMLA was for. Paperwork semi filled and placed in PCP's inbox for completion and signature.

## 2020-05-19 NOTE — Telephone Encounter (Signed)
Patient should request that her surgeon complete paperwork.

## 2020-05-19 NOTE — Telephone Encounter (Signed)
Called patient and notified her that paperwork needing to be given to surgeon. Will send via fax to them.

## 2020-05-20 ENCOUNTER — Other Ambulatory Visit: Payer: Self-pay

## 2020-05-20 ENCOUNTER — Ambulatory Visit (INDEPENDENT_AMBULATORY_CARE_PROVIDER_SITE_OTHER): Payer: 59 | Admitting: Physician Assistant

## 2020-05-20 ENCOUNTER — Encounter: Payer: Self-pay | Admitting: Physician Assistant

## 2020-05-20 DIAGNOSIS — K851 Biliary acute pancreatitis without necrosis or infection: Secondary | ICD-10-CM

## 2020-05-20 DIAGNOSIS — Z09 Encounter for follow-up examination after completed treatment for conditions other than malignant neoplasm: Secondary | ICD-10-CM

## 2020-05-20 NOTE — Progress Notes (Signed)
[facility] POST-OP OFFICE VISIT  05/20/2020  HPI: Patricia Bauer is a 50 y.o. female 17 days s/p robotic assisted laparoscopic cholecystectomy for gallstone pancreatitis.   She is overall doing better She continues to have incisional soreness, worse with movement, not needing anything for pain No fever, chills, nausea, or emesis She is tolerating PO but noticed a decreased appetite Normal bowel function without diarrhea.   Physical Exam: Constitutional: well appearing female, NAD Abdomen: Soft, non-tender, non-distended, no rebound/guarding Skin: Laparoscopic incisions are well healed, some ecchymosis in various healing stages, no erythema or drainage   Assessment/Plan: This is a 50 y.o. female 17 days s/p robotic assisted laparoscopic cholecystectomy for gallstone pancreatitis.    - Pain control prn; Ibuprofen / Tylenol  - Reviewed lifting restrictions  - Reviewed pathology; Labette  - She will follow up on as needed basis   -- Edison Simon, PA-C  [facility] 05/20/2020, 11:42 AM [phone] M-F: 7am - 4pm

## 2020-05-20 NOTE — Patient Instructions (Signed)

## 2020-05-22 ENCOUNTER — Inpatient Hospital Stay: Payer: 59 | Admitting: Family

## 2020-05-22 ENCOUNTER — Inpatient Hospital Stay: Payer: 59

## 2020-05-26 ENCOUNTER — Telehealth: Payer: Self-pay | Admitting: *Deleted

## 2020-05-26 NOTE — Telephone Encounter (Signed)
Faxed FMLA to [employer] at [phone] 

## 2020-05-29 ENCOUNTER — Ambulatory Visit (INDEPENDENT_AMBULATORY_CARE_PROVIDER_SITE_OTHER): Payer: 59 | Admitting: Psychology

## 2020-05-29 DIAGNOSIS — F410 Panic disorder [episodic paroxysmal anxiety] without agoraphobia: Secondary | ICD-10-CM | POA: Diagnosis not present

## 2020-06-05 ENCOUNTER — Inpatient Hospital Stay: Payer: 59 | Attending: Hematology & Oncology

## 2020-06-05 ENCOUNTER — Telehealth: Payer: Self-pay

## 2020-06-05 ENCOUNTER — Other Ambulatory Visit: Payer: Self-pay

## 2020-06-05 ENCOUNTER — Inpatient Hospital Stay (HOSPITAL_BASED_OUTPATIENT_CLINIC_OR_DEPARTMENT_OTHER): Payer: 59 | Admitting: Family

## 2020-06-05 ENCOUNTER — Encounter: Payer: Self-pay | Admitting: Family

## 2020-06-05 VITALS — BP 132/83 | HR 90 | Temp 97.8°F | Resp 18 | Wt 220.0 lb

## 2020-06-05 DIAGNOSIS — D508 Other iron deficiency anemias: Secondary | ICD-10-CM | POA: Insufficient documentation

## 2020-06-05 DIAGNOSIS — Z79899 Other long term (current) drug therapy: Secondary | ICD-10-CM | POA: Insufficient documentation

## 2020-06-05 DIAGNOSIS — Z9049 Acquired absence of other specified parts of digestive tract: Secondary | ICD-10-CM | POA: Insufficient documentation

## 2020-06-05 DIAGNOSIS — K909 Intestinal malabsorption, unspecified: Secondary | ICD-10-CM | POA: Insufficient documentation

## 2020-06-05 LAB — CBC WITH DIFFERENTIAL (CANCER CENTER ONLY)
Abs Immature Granulocytes: 0.06 10*3/uL (ref 0.00–0.07)
Basophils Absolute: 0 10*3/uL (ref 0.0–0.1)
Basophils Relative: 0 %
Eosinophils Absolute: 0.4 10*3/uL (ref 0.0–0.5)
Eosinophils Relative: 5 %
HCT: 40.7 % (ref 36.0–46.0)
Hemoglobin: 13.8 g/dL (ref 12.0–15.0)
Immature Granulocytes: 1 %
Lymphocytes Relative: 37 %
Lymphs Abs: 3.1 10*3/uL (ref 0.7–4.0)
MCH: 31.1 pg (ref 26.0–34.0)
MCHC: 33.9 g/dL (ref 30.0–36.0)
MCV: 91.7 fL (ref 80.0–100.0)
Monocytes Absolute: 0.4 10*3/uL (ref 0.1–1.0)
Monocytes Relative: 5 %
Neutro Abs: 4.4 10*3/uL (ref 1.7–7.7)
Neutrophils Relative %: 52 %
Platelet Count: 262 10*3/uL (ref 150–400)
RBC: 4.44 MIL/uL (ref 3.87–5.11)
RDW: 12.1 % (ref 11.5–15.5)
WBC Count: 8.3 10*3/uL (ref 4.0–10.5)
nRBC: 0 % (ref 0.0–0.2)

## 2020-06-05 LAB — RETICULOCYTES
Immature Retic Fract: 13.4 % (ref 2.3–15.9)
RBC.: 4.4 MIL/uL (ref 3.87–5.11)
Retic Count, Absolute: 110 10*3/uL (ref 19.0–186.0)
Retic Ct Pct: 2.5 % (ref 0.4–3.1)

## 2020-06-05 NOTE — Progress Notes (Signed)
Hematology and Oncology Follow Up Visit  Patricia Bauer [id number] Jul 04, 1970 50 y.o. 06/05/2020   Principle Diagnosis:  Iron deficiency anemia secondary to malabsorption  Current Therapy: IV iron as indicated    Interim History:  Patricia Bauer is here today for follow-up. She is recuperating from having her gallbladder removed on 05/03/2020.  She is still a little fatigue and notes mild SOB with exertion.  She has occasional diarrhea.  She has occasional episodes of nausea and her appetite has been a little down. She is making an effort to eat and stay well hydrated. Her weight is stable.  No blood loss noted. No bruising or petechiae.  No fever, chills, vomiting, cough, rash, dizziness, chest pain, palpitations, abdominal pain or changes in bladder habits.  No swelling, tenderness, numbness or tingling in her extremities.  No falls or syncope.   ECOG Performance Status: 1 - Symptomatic but completely ambulatory  Medications:  Allergies as of 06/05/2020      Reactions   Ibuprofen Swelling   Bilateral swelling of ankles and feet   Diflucan [fluconazole] Other (See Comments)   Pelvic pain   Onion Other (See Comments)   migraines    Topamax [topiramate] Other (See Comments)   Reaction:  Body aches    Omeprazole    Hydrocodone Anxiety, Palpitations, Other (See Comments)   Reaction:  Migraines       Medication List       Accurate as of June 05, 2020  1:13 PM. If you have any questions, ask your nurse or doctor.        ALPRAZolam 0.25 MG tablet Commonly known as: XANAX TAKE 1 TABLET(0.25 MG) BY MOUTH DAILY AS NEEDED FOR ANXIETY OR SLEEP   cyclobenzaprine 5 MG tablet Commonly known as: FLEXERIL Take 1 tablet (5 mg total) by mouth 3 (three) times daily as needed for muscle spasms.   pantoprazole 40 MG tablet Commonly known as: PROTONIX Take 1 tablet (40 mg total) by mouth daily.   SUMAtriptan 50 MG tablet Commonly known as: IMITREX TAKE 1 TABLET MAY  REPEAT IN 2 HOURS IF HEADACHE PERSISTS OR RECURS. DON'T USE MORE THAN TWO IN 24HRS   traMADol 50 MG tablet Commonly known as: ULTRAM Take 1 tablet (50 mg total) by mouth every 6 (six) hours as needed.   venlafaxine 37.5 MG tablet Commonly known as: EFFEXOR Take 37.5 mg by mouth 2 (two) times daily.       Allergies:  Allergies  Allergen Reactions  . Ibuprofen Swelling    Bilateral swelling of ankles and feet  . Diflucan [Fluconazole] Other (See Comments)    Pelvic pain  . Onion Other (See Comments)    migraines   . Topamax [Topiramate] Other (See Comments)    Reaction:  Body aches    . Omeprazole   . Hydrocodone Anxiety, Palpitations and Other (See Comments)    Reaction:  Migraines     Past Medical History, Surgical history, Social history, and Family History were reviewed and updated.  Review of Systems: All other 10 point review of systems is negative.   Physical Exam:  vitals were not taken for this visit.   Wt Readings from Last 3 Encounters:  05/01/20 230 lb (104.3 kg)  02/07/20 240 lb (108.9 kg)  12/31/19 245 lb 4 oz (111.2 kg)    Ocular: Sclerae unicteric, pupils equal, round and reactive to light Ear-nose-throat: Oropharynx clear, dentition fair Lymphatic: No cervical or supraclavicular adenopathy Lungs no rales or rhonchi, good excursion  bilaterally Heart regular rate and rhythm, no murmur appreciated Abd soft, nontender, positive bowel sounds MSK no focal spinal tenderness, no joint edema Neuro: non-focal, well-oriented, appropriate affect Breasts: Deferred   Lab Results  Component Value Date   WBC 8.3 06/05/2020   HGB 13.8 06/05/2020   HCT 40.7 06/05/2020   MCV 91.7 06/05/2020   PLT 262 06/05/2020   Lab Results  Component Value Date   FERRITIN 228 11/06/2019   IRON 69 11/06/2019   TIBC 359 11/06/2019   UIBC 290 11/06/2019   IRONPCTSAT 19 (L) 11/06/2019   Lab Results  Component Value Date   RETICCTPCT 2.5 06/05/2020   RBC 4.40  06/05/2020   No results found for: KPAFRELGTCHN, LAMBDASER, KAPLAMBRATIO No results found for: IGGSERUM, IGA, IGMSERUM No results found for: [name], SPEI   Chemistry      Component Value Date/Time   NA 137 05/04/2020 0503   NA 135 12/31/2019 1451   NA 139 03/13/2013 2022   K 3.9 05/04/2020 0503   K 3.6 03/13/2013 2022   CL 106 05/04/2020 0503   CL 106 03/13/2013 2022   CO2 23 05/04/2020 0503   CO2 26 03/13/2013 2022   BUN 9 05/04/2020 0503   BUN 11 12/31/2019 1451   BUN 11 03/13/2013 2022   CREATININE 0.84 05/04/2020 0503   CREATININE 1.05 (H) 05/24/2019 1359   CREATININE 0.80 03/13/2013 2022   GLU 94 11/23/2017 0000      Component Value Date/Time   CALCIUM 7.8 (L) 05/04/2020 0503   CALCIUM 8.7 03/13/2013 2022   ALKPHOS 101 05/04/2020 0503   AST 90 (H) 05/04/2020 0503   AST 27 05/24/2019 1359   ALT 216 (H) 05/04/2020 0503   ALT 32 05/24/2019 1359   BILITOT 1.1 05/04/2020 0503   BILITOT 0.3 12/31/2019 1451   BILITOT 0.3 05/24/2019 1359       Impression and Plan: Patricia Bauer is a very pleasant 50yo caucasian female with iron deficiency anemia secondary to malabsorption. Iron studies are pending. We will replace if needed.  Follow-up in 4 months.  She was encouraged to contact our office with any questions or concerns.   Laverna Peace, NP 2/18/20221:13 PM

## 2020-06-05 NOTE — Telephone Encounter (Signed)
Called and left a vm with appts per 06/05/20 los   [employer]

## 2020-06-08 LAB — IRON AND TIBC
Iron: 83 ug/dL (ref 41–142)
Saturation Ratios: 26 % (ref 21–57)
TIBC: 323 ug/dL (ref 236–444)
UIBC: 239 ug/dL (ref 120–384)

## 2020-06-08 LAB — FERRITIN: Ferritin: 251 ng/mL (ref 11–307)

## 2020-06-12 ENCOUNTER — Ambulatory Visit (INDEPENDENT_AMBULATORY_CARE_PROVIDER_SITE_OTHER): Payer: 59 | Admitting: Psychology

## 2020-06-12 DIAGNOSIS — F410 Panic disorder [episodic paroxysmal anxiety] without agoraphobia: Secondary | ICD-10-CM | POA: Diagnosis not present

## 2020-06-23 ENCOUNTER — Other Ambulatory Visit: Payer: Self-pay

## 2020-06-23 ENCOUNTER — Encounter: Payer: Self-pay | Admitting: Family Medicine

## 2020-06-23 ENCOUNTER — Ambulatory Visit: Payer: 59 | Admitting: Family Medicine

## 2020-06-23 VITALS — BP 138/90 | HR 82 | Temp 97.8°F | Wt 221.0 lb

## 2020-06-23 DIAGNOSIS — R030 Elevated blood-pressure reading, without diagnosis of hypertension: Secondary | ICD-10-CM | POA: Insufficient documentation

## 2020-06-23 DIAGNOSIS — N6320 Unspecified lump in the left breast, unspecified quadrant: Secondary | ICD-10-CM | POA: Diagnosis not present

## 2020-06-23 NOTE — Assessment & Plan Note (Signed)
Family hx of breast cancer in maternal aunt >70. Personal hx of breast cysts. New mass on exam non-tender. Given age and hx discussed imaging to further evaluate.

## 2020-06-23 NOTE — Patient Instructions (Signed)
You should get a phone call about scheduling the imaging.   Call if pain, fevers/chills, or increasing in size quickly in order to move up imaging if needed.

## 2020-06-23 NOTE — Progress Notes (Signed)
   Subjective:     Patricia Bauer is a 50 y.o. female presenting for Breast Mass (Noticed a week ago. Size of large marble at 4 oclock position )     HPI   #Breast - mass  - noticed 1 week ago - no longer having cycles - marble - no pain  - no skin changes - no discharge - maternal aunt with breast cancer ~75 - gets breast cysts and thinks that what this is  Review of Systems   Social History   Tobacco Use  Smoking Status Never Smoker  Smokeless Tobacco Never Used        Objective:    BP Readings from Last 3 Encounters:  06/23/20 138/90  06/05/20 132/83  05/04/20 (!) 146/79   Wt Readings from Last 3 Encounters:  06/23/20 221 lb (100.2 kg)  06/05/20 220 lb (99.8 kg)  05/01/20 230 lb (104.3 kg)    BP 138/90   Pulse 82   Temp 97.8 F (36.6 C) (Temporal)   Wt 221 lb (100.2 kg)   LMP 09/07/2009   SpO2 98%   BMI 37.93 kg/m    Physical Exam Exam conducted with a chaperone present.  Constitutional:      General: She is not in acute distress.    Appearance: She is well-developed. She is not diaphoretic.  HENT:     Right Ear: External ear normal.     Left Ear: External ear normal.  Eyes:     Conjunctiva/sclera: Conjunctivae normal.  Cardiovascular:     Rate and Rhythm: Normal rate.  Pulmonary:     Effort: Pulmonary effort is normal.  Chest:  Breasts:     Right: Normal.     Left: Mass (1.5 x 1 inch mass left of the nipple around the 3 o'clock position. Firm, non-mobile. non-tender) present. No inverted nipple, skin change or tenderness.    Musculoskeletal:     Cervical back: Neck supple.  Skin:    General: Skin is warm and dry.     Capillary Refill: Capillary refill takes less than 2 seconds.  Neurological:     Mental Status: She is alert. Mental status is at baseline.  Psychiatric:        Mood and Affect: Mood normal.        Behavior: Behavior normal.           Assessment & Plan:   Problem List Items Addressed This Visit       Other   Mass of left breast - Primary    Family hx of breast cancer in maternal aunt >70. Personal hx of breast cysts. New mass on exam non-tender. Given age and hx discussed imaging to further evaluate.       Relevant Orders   MM DIAG BREAST TOMO BILATERAL   US BREAST LTD UNI LEFT INC AXILLA   Elevated blood pressure reading    Suspect pain (post-op from GB removal)/anxiety. Was elevated once before. Improved on repeat though still high. Will continue to monitor          Return if symptoms worsen or fail to improve.  [name], MD  This visit occurred during the SARS-CoV-2 public health emergency.  Safety protocols were in place, including screening questions prior to the visit, additional usage of staff PPE, and extensive cleaning of exam room while observing appropriate contact time as indicated for disinfecting solutions.

## 2020-06-23 NOTE — Assessment & Plan Note (Signed)
Suspect pain (post-op from GB removal)/anxiety. Was elevated once before. Improved on repeat though still high. Will continue to monitor

## 2020-06-26 ENCOUNTER — Ambulatory Visit: Payer: 59 | Admitting: Psychology

## 2020-06-30 ENCOUNTER — Encounter: Payer: Self-pay | Admitting: Family Medicine

## 2020-07-01 NOTE — Telephone Encounter (Signed)
Can we try to get her evaluated sooner? Increasing in size breast lump now painful

## 2020-07-01 NOTE — Addendum Note (Signed)
Addended by: Kris Mouton on: 07/01/2020 12:28 PM   Modules accepted: Orders

## 2020-07-07 ENCOUNTER — Ambulatory Visit
Admission: RE | Admit: 2020-07-07 | Discharge: 2020-07-07 | Disposition: A | Discharge status: Home / Self Care | Payer: 59 | Source: Ambulatory Visit | Attending: Family Medicine | Admitting: Family Medicine

## 2020-07-07 ENCOUNTER — Other Ambulatory Visit: Payer: Self-pay

## 2020-07-07 DIAGNOSIS — N6320 Unspecified lump in the left breast, unspecified quadrant: Secondary | ICD-10-CM | POA: Insufficient documentation

## 2020-07-10 ENCOUNTER — Ambulatory Visit (INDEPENDENT_AMBULATORY_CARE_PROVIDER_SITE_OTHER): Payer: 59 | Admitting: Psychology

## 2020-07-10 DIAGNOSIS — F410 Panic disorder [episodic paroxysmal anxiety] without agoraphobia: Secondary | ICD-10-CM

## 2020-07-15 ENCOUNTER — Encounter: Payer: Self-pay | Admitting: Podiatry

## 2020-07-15 ENCOUNTER — Ambulatory Visit (INDEPENDENT_AMBULATORY_CARE_PROVIDER_SITE_OTHER): Payer: 59

## 2020-07-15 ENCOUNTER — Other Ambulatory Visit: Payer: Self-pay

## 2020-07-15 ENCOUNTER — Ambulatory Visit: Payer: 59 | Admitting: Podiatry

## 2020-07-15 DIAGNOSIS — M778 Other enthesopathies, not elsewhere classified: Secondary | ICD-10-CM

## 2020-07-15 DIAGNOSIS — M19079 Primary osteoarthritis, unspecified ankle and foot: Secondary | ICD-10-CM | POA: Diagnosis not present

## 2020-07-15 DIAGNOSIS — M2011 Hallux valgus (acquired), right foot: Secondary | ICD-10-CM

## 2020-07-15 NOTE — Progress Notes (Signed)
Subjective:  [name], female    DOB: [birth year],  MRN: [id number] HPI Chief Complaint  Patient presents with  . Foot Pain    1st MPJ and dorsal midfoot right - bunion deformity x years, redness, tender occasionally, "locks up" sometimes, certain shoes uncomfortable, knot midfoot, pain with pressure  . New Patient (Initial Visit)    50 y.o. female presents with the above complaint.   ROS: Denies fever chills nausea vomiting muscle aches pains calf pain back pain chest pain shortness of breath.  Past Medical History:  Diagnosis Date  . Anemia   . Anxiety   . Colon polyp   . Depression   . GERD (gastroesophageal reflux disease)   . Headache    migraines   . History of hysterectomy    still has both ovaries  . History of migraine headaches   . PONV (postoperative nausea and vomiting)    Past Surgical History:  Procedure Laterality Date  . ABDOMINAL HYSTERECTOMY    . COLONOSCOPY    . KNEE ARTHROPLASTY    . KNEE ARTHROSCOPY    . knee athroscopy  5/12   LT  . LAPAROSCOPIC VAGINAL HYSTERECTOMY  5/11   Supra cervical  . left partial knee replacement    . PARTIAL KNEE ARTHROPLASTY Left 02/10/2014   Procedure: LEFT MEDIAL UNICOMPARTMENTAL KNEE ARTHROPLASTY;  Surgeon: Gearlean Alf, MD;  Location: [facility];  Service: Orthopedics;  Laterality: Left;  . UPPER GI ENDOSCOPY    . WISDOM TOOTH EXTRACTION  1995    Current Outpatient Medications:  .  ALPRAZolam (XANAX) 0.25 MG tablet, TAKE 1 TABLET(0.25 MG) BY MOUTH DAILY AS NEEDED FOR ANXIETY OR SLEEP, Disp: 30 tablet, Rfl: 0 .  SUMAtriptan (IMITREX) 50 MG tablet, TAKE 1 TABLET MAY REPEAT IN 2 HOURS IF HEADACHE PERSISTS OR RECURS. DON'T USE MORE THAN TWO IN 24HRS, Disp: 90 tablet, Rfl: 2 .  traMADol (ULTRAM) 50 MG tablet, Take 1 tablet (50 mg total) by mouth every 6 (six) hours as needed., Disp: 15 tablet, Rfl: 0 .  venlafaxine (EFFEXOR) 37.5 MG tablet, Take 37.5 mg by mouth 2 (two) times daily., Disp: , Rfl:    Allergies  Allergen Reactions  . Ibuprofen Swelling    Bilateral swelling of ankles and feet  . Diflucan [Fluconazole] Other (See Comments)    Pelvic pain  . Onion Other (See Comments)    migraines   . Topamax [Topiramate] Other (See Comments)    Reaction:  Body aches    . Omeprazole   . Hydrocodone Anxiety, Palpitations and Other (See Comments)    Reaction:  Migraines    Review of Systems Objective:  There were no vitals filed for this visit.  General: Well developed, nourished, in no acute distress, alert and oriented x3   Dermatological: Skin is warm, dry and supple bilateral. Nails x 10 are well maintained; remaining integument appears unremarkable at this time. There are no open sores, no preulcerative lesions, no rash or signs of infection present.  Vascular: Dorsalis Pedis artery and Posterior Tibial artery pedal pulses are 2/4 bilateral with immedate capillary fill time. Pedal hair growth present. No varicosities and no lower extremity edema present bilateral.   Neruologic: Grossly intact via light touch bilateral. Vibratory intact via tuning fork bilateral. Protective threshold with Semmes Wienstein monofilament intact to all pedal sites bilateral. Patellar and Achilles deep tendon reflexes 2+ bilateral. No Babinski or clonus noted bilateral.   Musculoskeletal: No gross boney pedal deformities bilateral. No  pain, crepitus, or limitation noted with foot and ankle range of motion bilateral. Muscular strength 5/5 in all groups tested bilateral.  Hallux abductovalgus deformity is noted she does have a hypermobile first tarsometatarsal joint with pronation.  She has rotation valgus rotation of the first metatarsal.  She also has crepitus on range of motion of the first tarsometatarsal joint.  Gait: Unassisted, Nonantalgic.    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  Lateral view does demonstrate what appears to be a rupture or dislocation of the plantar ligaments and the first tarsometatarsal joint.  There is also appears to be some dorsal spurring at the second MTPJ as well as the first MTPJ joint space narrowing is also noted with early dislocation.  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     Susie Pousson T. [place], [place]

## 2020-07-18 ENCOUNTER — Other Ambulatory Visit: Payer: Self-pay | Admitting: Family Medicine

## 2020-07-20 NOTE — Telephone Encounter (Signed)
Last script was only for #15 and given by Margarette Canada, NP.

## 2020-07-24 ENCOUNTER — Ambulatory Visit: Payer: 59 | Admitting: Psychology

## 2020-08-05 ENCOUNTER — Encounter: Payer: Self-pay | Admitting: Family Medicine

## 2020-08-06 ENCOUNTER — Other Ambulatory Visit: Payer: 59

## 2020-08-06 ENCOUNTER — Encounter: Payer: Self-pay | Admitting: Family

## 2020-08-06 ENCOUNTER — Other Ambulatory Visit: Payer: Self-pay | Admitting: *Deleted

## 2020-08-06 ENCOUNTER — Ambulatory Visit (INDEPENDENT_AMBULATORY_CARE_PROVIDER_SITE_OTHER): Payer: 59 | Admitting: Psychology

## 2020-08-06 DIAGNOSIS — F410 Panic disorder [episodic paroxysmal anxiety] without agoraphobia: Secondary | ICD-10-CM | POA: Diagnosis not present

## 2020-08-06 DIAGNOSIS — D508 Other iron deficiency anemias: Secondary | ICD-10-CM

## 2020-08-06 DIAGNOSIS — D519 Vitamin B12 deficiency anemia, unspecified: Secondary | ICD-10-CM

## 2020-08-07 ENCOUNTER — Inpatient Hospital Stay: Payer: 59 | Attending: Hematology & Oncology

## 2020-08-07 ENCOUNTER — Other Ambulatory Visit: Payer: Self-pay

## 2020-08-07 ENCOUNTER — Ambulatory Visit: Payer: 59 | Admitting: Psychology

## 2020-08-07 DIAGNOSIS — D508 Other iron deficiency anemias: Secondary | ICD-10-CM | POA: Diagnosis present

## 2020-08-07 DIAGNOSIS — E538 Deficiency of other specified B group vitamins: Secondary | ICD-10-CM | POA: Diagnosis not present

## 2020-08-07 DIAGNOSIS — D519 Vitamin B12 deficiency anemia, unspecified: Secondary | ICD-10-CM

## 2020-08-07 LAB — CBC WITH DIFFERENTIAL (CANCER CENTER ONLY)
Abs Immature Granulocytes: 0.03 10*3/uL (ref 0.00–0.07)
Basophils Absolute: 0 10*3/uL (ref 0.0–0.1)
Basophils Relative: 0 %
Eosinophils Absolute: 0.2 10*3/uL (ref 0.0–0.5)
Eosinophils Relative: 2 %
HCT: 40.4 % (ref 36.0–46.0)
Hemoglobin: 13.8 g/dL (ref 12.0–15.0)
Immature Granulocytes: 0 %
Lymphocytes Relative: 26 %
Lymphs Abs: 2.5 10*3/uL (ref 0.7–4.0)
MCH: 31.1 pg (ref 26.0–34.0)
MCHC: 34.2 g/dL (ref 30.0–36.0)
MCV: 91 fL (ref 80.0–100.0)
Monocytes Absolute: 0.4 10*3/uL (ref 0.1–1.0)
Monocytes Relative: 4 %
Neutro Abs: 6.5 10*3/uL (ref 1.7–7.7)
Neutrophils Relative %: 68 %
Platelet Count: 273 10*3/uL (ref 150–400)
RBC: 4.44 MIL/uL (ref 3.87–5.11)
RDW: 11.9 % (ref 11.5–15.5)
WBC Count: 9.7 10*3/uL (ref 4.0–10.5)
nRBC: 0 % (ref 0.0–0.2)

## 2020-08-07 LAB — RETICULOCYTES
Immature Retic Fract: 12.7 % (ref 2.3–15.9)
RBC.: 4.5 MIL/uL (ref 3.87–5.11)
Retic Count, Absolute: 95.8 10*3/uL (ref 19.0–186.0)
Retic Ct Pct: 2.1 % (ref 0.4–3.1)

## 2020-08-10 LAB — IRON AND TIBC
Iron: 65 ug/dL (ref 41–142)
Saturation Ratios: 19 % — ABNORMAL LOW (ref 21–57)
TIBC: 336 ug/dL (ref 236–444)
UIBC: 272 ug/dL (ref 120–384)

## 2020-08-10 LAB — FERRITIN: Ferritin: 144 ng/mL (ref 11–307)

## 2020-08-11 ENCOUNTER — Telehealth: Payer: Self-pay

## 2020-08-11 NOTE — Telephone Encounter (Signed)
S/w pt per sch message and she is aware of her appts   Patricia Bauer

## 2020-08-12 DIAGNOSIS — M064 Inflammatory polyarthropathy: Secondary | ICD-10-CM | POA: Insufficient documentation

## 2020-08-12 DIAGNOSIS — Z79899 Other long term (current) drug therapy: Secondary | ICD-10-CM | POA: Insufficient documentation

## 2020-08-12 DIAGNOSIS — Z7960 Long term (current) use of unspecified immunomodulators and immunosuppressants: Secondary | ICD-10-CM | POA: Insufficient documentation

## 2020-08-14 ENCOUNTER — Ambulatory Visit (INDEPENDENT_AMBULATORY_CARE_PROVIDER_SITE_OTHER): Payer: 59 | Admitting: Psychology

## 2020-08-14 ENCOUNTER — Inpatient Hospital Stay: Payer: 59

## 2020-08-14 DIAGNOSIS — F410 Panic disorder [episodic paroxysmal anxiety] without agoraphobia: Secondary | ICD-10-CM | POA: Diagnosis not present

## 2020-08-20 ENCOUNTER — Encounter: Payer: Self-pay | Admitting: Family Medicine

## 2020-08-21 ENCOUNTER — Inpatient Hospital Stay: Payer: 59 | Attending: Hematology & Oncology

## 2020-08-21 ENCOUNTER — Ambulatory Visit (INDEPENDENT_AMBULATORY_CARE_PROVIDER_SITE_OTHER): Payer: 59 | Admitting: Psychology

## 2020-08-21 ENCOUNTER — Other Ambulatory Visit: Payer: Self-pay

## 2020-08-21 VITALS — BP 142/80 | HR 80 | Temp 98.1°F | Resp 17

## 2020-08-21 DIAGNOSIS — D508 Other iron deficiency anemias: Secondary | ICD-10-CM | POA: Insufficient documentation

## 2020-08-21 DIAGNOSIS — F410 Panic disorder [episodic paroxysmal anxiety] without agoraphobia: Secondary | ICD-10-CM

## 2020-08-21 MED ORDER — SODIUM CHLORIDE 0.9 % IV SOLN
200.0000 mg | Freq: Once | INTRAVENOUS | Status: AC
  Administered 2020-08-21: 200 mg via INTRAVENOUS
  Filled 2020-08-21: qty 200

## 2020-08-21 NOTE — Patient Instructions (Signed)

## 2020-08-26 DIAGNOSIS — S83249A Other tear of medial meniscus, current injury, unspecified knee, initial encounter: Secondary | ICD-10-CM | POA: Insufficient documentation

## 2020-08-28 ENCOUNTER — Other Ambulatory Visit: Payer: Self-pay

## 2020-08-28 ENCOUNTER — Inpatient Hospital Stay: Payer: 59

## 2020-08-28 VITALS — BP 143/75 | HR 82 | Temp 98.9°F | Resp 17

## 2020-08-28 DIAGNOSIS — D508 Other iron deficiency anemias: Secondary | ICD-10-CM | POA: Diagnosis not present

## 2020-08-28 MED ORDER — SODIUM CHLORIDE 0.9 % IV SOLN
200.0000 mg | Freq: Once | INTRAVENOUS | Status: AC
  Administered 2020-08-28: 200 mg via INTRAVENOUS
  Filled 2020-08-28: qty 200

## 2020-08-28 MED ORDER — SODIUM CHLORIDE 0.9 % IV SOLN
INTRAVENOUS | Status: DC
  Filled 2020-08-28: qty 250

## 2020-08-28 NOTE — Patient Instructions (Signed)

## 2020-09-04 ENCOUNTER — Ambulatory Visit: Payer: 59 | Admitting: Psychology

## 2020-09-18 ENCOUNTER — Ambulatory Visit (INDEPENDENT_AMBULATORY_CARE_PROVIDER_SITE_OTHER): Payer: 59 | Admitting: Psychology

## 2020-09-18 DIAGNOSIS — F410 Panic disorder [episodic paroxysmal anxiety] without agoraphobia: Secondary | ICD-10-CM

## 2020-10-02 ENCOUNTER — Other Ambulatory Visit: Payer: 59

## 2020-10-02 ENCOUNTER — Ambulatory Visit (INDEPENDENT_AMBULATORY_CARE_PROVIDER_SITE_OTHER): Payer: 59 | Admitting: Psychology

## 2020-10-02 ENCOUNTER — Ambulatory Visit: Payer: 59 | Admitting: Family

## 2020-10-02 DIAGNOSIS — F410 Panic disorder [episodic paroxysmal anxiety] without agoraphobia: Secondary | ICD-10-CM | POA: Diagnosis not present

## 2020-10-07 ENCOUNTER — Other Ambulatory Visit: Payer: Self-pay | Admitting: Family Medicine

## 2020-10-07 DIAGNOSIS — F410 Panic disorder [episodic paroxysmal anxiety] without agoraphobia: Secondary | ICD-10-CM

## 2020-10-08 NOTE — Telephone Encounter (Signed)
Last Fill or Written Date and Quantity: 04/23/20 #30 w/ 0 Last Office Visit and Type: 06/23/20 for mass in breast Next Office Visit and Type: none scheduled

## 2020-10-12 DIAGNOSIS — M0609 Rheumatoid arthritis without rheumatoid factor, multiple sites: Secondary | ICD-10-CM | POA: Insufficient documentation

## 2020-10-16 ENCOUNTER — Ambulatory Visit: Payer: 59 | Admitting: Psychology

## 2020-10-29 ENCOUNTER — Ambulatory Visit: Payer: 59 | Admitting: Adult Health

## 2020-10-30 ENCOUNTER — Ambulatory Visit (INDEPENDENT_AMBULATORY_CARE_PROVIDER_SITE_OTHER): Payer: 59 | Admitting: Psychology

## 2020-10-30 DIAGNOSIS — F410 Panic disorder [episodic paroxysmal anxiety] without agoraphobia: Secondary | ICD-10-CM

## 2020-11-03 ENCOUNTER — Telehealth: Payer: Self-pay

## 2020-11-03 ENCOUNTER — Inpatient Hospital Stay: Payer: 59 | Admitting: Family

## 2020-11-03 ENCOUNTER — Inpatient Hospital Stay: Payer: 59

## 2020-11-10 ENCOUNTER — Ambulatory Visit (INDEPENDENT_AMBULATORY_CARE_PROVIDER_SITE_OTHER): Payer: 59 | Admitting: Adult Health

## 2020-11-10 ENCOUNTER — Encounter: Payer: Self-pay | Admitting: Adult Health

## 2020-11-10 ENCOUNTER — Other Ambulatory Visit: Payer: Self-pay

## 2020-11-10 VITALS — BP 165/91 | HR 93 | Ht 64.5 in | Wt 225.0 lb

## 2020-11-10 DIAGNOSIS — F331 Major depressive disorder, recurrent, moderate: Secondary | ICD-10-CM

## 2020-11-10 DIAGNOSIS — F410 Panic disorder [episodic paroxysmal anxiety] without agoraphobia: Secondary | ICD-10-CM

## 2020-11-10 DIAGNOSIS — F411 Generalized anxiety disorder: Secondary | ICD-10-CM

## 2020-11-10 DIAGNOSIS — F422 Mixed obsessional thoughts and acts: Secondary | ICD-10-CM

## 2020-11-10 DIAGNOSIS — F4000 Agoraphobia, unspecified: Secondary | ICD-10-CM | POA: Diagnosis not present

## 2020-11-10 MED ORDER — VENLAFAXINE HCL ER 150 MG PO CP24: 150.0000 mg | ORAL_CAPSULE | Freq: Every day | ORAL | 5 refills | Status: DC

## 2020-11-10 NOTE — Progress Notes (Signed)
[facility] MD/PA/NP Initial Note  11/10/2020 3:59 PM Patricia Bauer  MRN:  [id number]  Chief Complaint:   HPI:   Describes mood today as "not the best". Pleasant. Tearful at times. Mood symptoms - reports depression, anxiety, and irritability. Reports panic attacks - 2 to 4 times a week - using Xanax as needed. Mood is medium to low - "mostly low". Stating "the medication has helped some". Started on Effexor in late April or early May for migraine prevention. Feels like the medication has been helpful for mood symptoms as well, but not as much as she would like. Willing to increase dose of Effexor from 112.'5mg'$  daily to '150mg'$  daily for mood stability. Recently lost brother in law - stroke last year. Varying interest and motivation. Taking medications as prescribed.  Energy levels lower - "horrible". Active, does not have a regular exercise routine. Typically likes to work out - having "flare ups" a few times a week.  Enjoys some usual interests and activities. Single. Divorced. Daughter (38), husband and 2 children - 3 and 18 months living with her. Spending time with family and friends. Appetite adequate. Weight gain. Sleeping difficulties - history of insomnia. Averages 4 to 6 hours most day and eventually body crashes and she will sleep 12 or more hours - averaging twice a month. Focus and concentration "on and off" Completing tasks. Managing aspects of household. Works full time for [employer].  Denies SI or HI.  Denies AH or VH. Therapist - Marya Fossa - x 2 years. 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  Previous medication trials:  Celexa, Zoloft - flat, Wellbutrin  Visit Diagnosis:    ICD-10-CM   1. Mixed obsessional thoughts and acts  F42.2 venlafaxine XR (EFFEXOR XR) 150 MG 24 hr capsule    2. Major depressive disorder, recurrent episode, moderate (HCC)  F33.1 venlafaxine XR (EFFEXOR XR) 150 MG 24 hr capsule    3. GAD (generalized  anxiety disorder)  F41.1 venlafaxine XR (EFFEXOR XR) 150 MG 24 hr capsule    4. 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 Panic attacks  F41.0 venlafaxine XR (EFFEXOR XR) 150 MG 24 hr capsule      Past Psychiatric History: One admission years ago - going through a divorce - very young - [facility] 4 [facility].  Past Medical History:  Past Medical History:  Diagnosis Date   Anemia    Anxiety    Colon polyp    Depression    GERD (gastroesophageal reflux disease)    Headache    migraines    History of hysterectomy    still has both ovaries   History of migraine headaches    PONV (postoperative nausea and vomiting)     Past Surgical History:  Procedure Laterality Date   ABDOMINAL HYSTERECTOMY     COLONOSCOPY     KNEE ARTHROPLASTY     KNEE ARTHROSCOPY     knee athroscopy  5/12   LT   LAPAROSCOPIC VAGINAL HYSTERECTOMY  5/11   Supra cervical   left partial knee replacement     PARTIAL KNEE ARTHROPLASTY Left 02/10/2014   Procedure: LEFT MEDIAL UNICOMPARTMENTAL KNEE ARTHROPLASTY;  Surgeon: Gearlean Alf, MD;  Location: [facility];  Service: Orthopedics;  Laterality: Left;   UPPER GI ENDOSCOPY     WISDOM TOOTH EXTRACTION  1995    Family Psychiatric History: Family history of mental illness.  Family History:  Family History  Problem Relation Age of Onset   Stroke Father    Heart attack Father    Hyperlipidemia Mother    Hypertension Mother    Breast cancer Maternal Aunt 75   Colon cancer Neg Hx    Colon polyps Neg Hx    Kidney disease Neg Hx    Gallbladder disease Neg Hx    Esophageal cancer Neg Hx    Alcohol abuse Neg Hx    Drug abuse Neg Hx    Depression Neg Hx    Bipolar disorder Neg Hx    Anxiety disorder Neg Hx    Schizophrenia Neg Hx    Suicidality Neg Hx     Social History:  Social History   Socioeconomic History   Marital status: Single    Spouse name: Not on file   Number of children: 1   Years of education: Not  on file   Highest education level: Not on file  Occupational History   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[employer]  Tobacco Use   Smoking status: Never   Smokeless tobacco: Never  Vaping Use   Vaping Use: Never used  Substance and Sexual Activity   Alcohol use: Yes    Alcohol/week: 0.0 standard drinks    Comment: occassional- once a month has about 2-3 drinks   Drug use: No   Sexual activity: Never  Other Topics Concern   Not on file  Social History Narrative   Not on file   Social Determinants of Health   Financial Resource Strain: Not on file  Food Insecurity: Not on file  Transportation Needs: Not on file  Physical Activity: Not on file  Stress: Not on file  Social Connections: Not on file    Allergies:  Allergies  Allergen Reactions   Ibuprofen Swelling    Bilateral swelling of ankles and feet   Diflucan [Fluconazole] Other (See Comments)    Pelvic pain   Onion Other (See Comments)    migraines    Topamax [Topiramate] Other (See Comments)    Reaction:  Body aches     Omeprazole    Hydrocodone Anxiety, Palpitations and Other (See Comments)    Reaction:  Migraines     Metabolic Disorder Labs: Lab Results  Component Value Date   HGBA1C 6.4 (H) 12/31/2019   No results found for: PROLACTIN Lab Results  Component Value Date   CHOL 230 (H) 12/31/2019   TRIG 231 (H) 12/31/2019   HDL 45 12/31/2019   CHOLHDL 5.1 (H) 12/31/2019   LDLCALC 143 (H) 12/31/2019   LDLCALC 112 11/23/2017   Lab Results  Component Value Date   TSH 2.840 10/25/2017   TSH 2.050 11/25/2016    Therapeutic Level Labs: No results found for: LITHIUM No results found for: VALPROATE No components found for:  CBMZ  Current Medications: Current Outpatient Medications  Medication Sig Dispense Refill   venlafaxine XR (EFFEXOR XR) 150 MG 24 hr capsule Take 1 capsule (150 mg total) by mouth daily with breakfast. 30 capsule 5   ALPRAZolam (XANAX) 0.25 MG tablet TAKE 1 TABLET(0.25 MG) BY MOUTH  DAILY AS NEEDED FOR ANXIETY OR SLEEP 30 tablet 0   folic acid (FOLVITE) 1 MG tablet Take 1 mg by mouth daily.     gabapentin (NEURONTIN) 300 MG capsule Take by mouth.     methotrexate (RHEUMATREX) 2.5 MG tablet SMARTSIG:5 Tablet(s) By Mouth Once a Week     SUMAtriptan (IMITREX) 50 MG tablet TAKE 1 TABLET MAY REPEAT  IN 2 HOURS IF HEADACHE PERSISTS OR RECURS. DON'T USE MORE THAN TWO IN 24HRS 90 tablet 2   traMADol (ULTRAM) 50 MG tablet Take 1 tablet (50 mg total) by mouth every 6 (six) hours as needed. 15 tablet 0   No current facility-administered medications for this visit.    Medication Side Effects: none  Orders placed this visit:  No orders of the defined types were placed in this encounter.   Psychiatric Specialty Exam:  Review of Systems  Blood pressure (!) 165/91, pulse 93, height 5' 4.5" (1.638 m), weight 225 lb (102.1 kg), last menstrual period 09/07/2009.Body mass index is 38.02 kg/m.  General Appearance: Casual and Neat  Eye Contact:  Good  Speech:  Clear and Coherent and Normal Rate  Volume:  Normal  Mood:  Anxious, Depressed, and Irritable  Affect:  Appropriate and Congruent  Thought Process:  Coherent and Descriptions of Associations: Intact  Orientation:  Full (Time, Place, and Person)  Thought Content: Logical   Suicidal Thoughts:  No  Homicidal Thoughts:  No  Memory:  WNL  Judgement:  Good  Insight:  Good  Psychomotor Activity:  Normal  Concentration:  Concentration: Good  Recall:  Good  Fund of Knowledge: Good  Language: Good  Assets:  Communication Skills Desire for Improvement Financial Resources/Insurance Housing Intimacy Leisure Time Physical Health Resilience Social Support Talents/Skills Transportation Vocational/Educational  ADL's:  Intact  Cognition: WNL  Prognosis:  Good   Screenings:   MDQ screener  GAD-7    Flowsheet Row Office Visit from 10/10/2019 in [place] at [facility] Visit from 03/28/2019 in [facility]  [place]  Total GAD-7 Score 10 7      PHQ2-9    [place] Visit from 12/31/2019 in [place] at [facility] Visit from 10/10/2019 in [place] at [facility] Visit from 03/28/2019 in [place] Visit from 07/24/2018 in [place] Visit from 04/23/2018 in [facility] [place]  PHQ-2 Total Score 2 0 0 0 0  PHQ-9 Total Score '8 8 7 '$ -- --       Receiving Psychotherapy: Yes   Treatment Plan/Recommendations:   Plan:  PDMP reviewed  Add Effexor XR '150mg'$  every morning D/C Effexor 37.'5mg'$  in the am and 2 - 37.'5mg'$  at bedtime. Xanax 0.'25mg'$  as needed   Time spent with patient was 60 minutes. Greater than 50% of face to face time with patient was spent on counseling and coordination of care.    RTC 4 weeks  Patient advised to contact office with any questions, adverse effects, or acute worsening in signs and symptoms.      Aloha Gell, NP

## 2020-11-13 ENCOUNTER — Ambulatory Visit (INDEPENDENT_AMBULATORY_CARE_PROVIDER_SITE_OTHER): Payer: 59 | Admitting: Psychology

## 2020-11-13 DIAGNOSIS — F410 Panic disorder [episodic paroxysmal anxiety] without agoraphobia: Secondary | ICD-10-CM

## 2020-11-27 ENCOUNTER — Ambulatory Visit (INDEPENDENT_AMBULATORY_CARE_PROVIDER_SITE_OTHER): Payer: 59 | Admitting: Psychology

## 2020-11-27 DIAGNOSIS — F410 Panic disorder [episodic paroxysmal anxiety] without agoraphobia: Secondary | ICD-10-CM

## 2020-11-29 ENCOUNTER — Encounter: Payer: Self-pay | Admitting: Family Medicine

## 2020-12-02 ENCOUNTER — Encounter: Payer: Self-pay | Admitting: Family

## 2020-12-02 ENCOUNTER — Inpatient Hospital Stay: Payer: 59 | Attending: Hematology & Oncology

## 2020-12-02 ENCOUNTER — Telehealth: Payer: Self-pay | Admitting: *Deleted

## 2020-12-02 ENCOUNTER — Inpatient Hospital Stay (HOSPITAL_BASED_OUTPATIENT_CLINIC_OR_DEPARTMENT_OTHER): Payer: 59 | Admitting: Family

## 2020-12-02 ENCOUNTER — Other Ambulatory Visit: Payer: Self-pay

## 2020-12-02 VITALS — BP 152/89 | HR 73 | Temp 98.5°F | Resp 17 | Wt 233.0 lb

## 2020-12-02 DIAGNOSIS — K909 Intestinal malabsorption, unspecified: Secondary | ICD-10-CM | POA: Insufficient documentation

## 2020-12-02 DIAGNOSIS — Z79899 Other long term (current) drug therapy: Secondary | ICD-10-CM | POA: Insufficient documentation

## 2020-12-02 DIAGNOSIS — D508 Other iron deficiency anemias: Secondary | ICD-10-CM | POA: Insufficient documentation

## 2020-12-02 LAB — CBC WITH DIFFERENTIAL (CANCER CENTER ONLY)
Abs Immature Granulocytes: 0.05 10*3/uL (ref 0.00–0.07)
Basophils Absolute: 0.1 10*3/uL (ref 0.0–0.1)
Basophils Relative: 1 %
Eosinophils Absolute: 0.2 10*3/uL (ref 0.0–0.5)
Eosinophils Relative: 2 %
HCT: 38.4 % (ref 36.0–46.0)
Hemoglobin: 13.1 g/dL (ref 12.0–15.0)
Immature Granulocytes: 1 %
Lymphocytes Relative: 31 %
Lymphs Abs: 3.2 10*3/uL (ref 0.7–4.0)
MCH: 33 pg (ref 26.0–34.0)
MCHC: 34.1 g/dL (ref 30.0–36.0)
MCV: 96.7 fL (ref 80.0–100.0)
Monocytes Absolute: 0.6 10*3/uL (ref 0.1–1.0)
Monocytes Relative: 6 %
Neutro Abs: 6.2 10*3/uL (ref 1.7–7.7)
Neutrophils Relative %: 59 %
Platelet Count: 293 10*3/uL (ref 150–400)
RBC: 3.97 MIL/uL (ref 3.87–5.11)
RDW: 13.7 % (ref 11.5–15.5)
WBC Count: 10.3 10*3/uL (ref 4.0–10.5)
nRBC: 0 % (ref 0.0–0.2)

## 2020-12-02 NOTE — Progress Notes (Signed)
Hematology and Oncology Follow Up Visit  Patricia Bauer [id number] 06-Apr-1971 50 y.o. 12/02/2020   Principle Diagnosis:  Iron deficiency anemia secondary to malabsorption   Current Therapy:        IV iron as indicated   Interim History:  Patricia Bauer is here today for follow-up. She was diagnosed with RA and fibromyalgia in May and has since start treatment with Methotrexate and Neurontin.  She blood loss noted. No petechiae.  No fever, chills, n/v, cough, rash, dizziness, chest pain, palpitations, abdominal pain or changes in bowel or bladder habits.  She has mild SOB with any exertion (stairs).  No swelling in her extremities at this time.  She notes that arthritis pain mostly in her hands.  No falls or syncope to report.  She has been eating well and doing her best to add in iron rich foods. She feels she is staying hydrated throughout the day. Her weight is 233 lbs.   ECOG Performance Status: 1 - Symptomatic but completely ambulatory  Medications:  Allergies as of 12/02/2020       Reactions   Ibuprofen Swelling   Bilateral swelling of ankles and feet   Diflucan [fluconazole] Other (See Comments)   Pelvic pain   Onion Other (See Comments)   migraines    Topamax [topiramate] Other (See Comments)   Reaction:  Body aches    Omeprazole    Hydrocodone Anxiety, Palpitations, Other (See Comments)   Reaction:  Migraines         Medication List        Accurate as of December 02, 2020  2:26 PM. If you have any questions, ask your nurse or doctor.          ALPRAZolam 0.25 MG tablet Commonly known as: XANAX TAKE 1 TABLET(0.25 MG) BY MOUTH DAILY AS NEEDED FOR ANXIETY OR SLEEP   folic acid 1 MG tablet Commonly known as: FOLVITE Take 1 mg by mouth daily.   gabapentin 300 MG capsule Commonly known as: NEURONTIN Take by mouth.   methotrexate 2.5 MG tablet Commonly known as: RHEUMATREX SMARTSIG:5 Tablet(s) By Mouth Once a Week   SUMAtriptan 50 MG tablet Commonly  known as: IMITREX TAKE 1 TABLET MAY REPEAT IN 2 HOURS IF HEADACHE PERSISTS OR RECURS. DON'T USE MORE THAN TWO IN 24HRS   traMADol 50 MG tablet Commonly known as: ULTRAM Take 1 tablet (50 mg total) by mouth every 6 (six) hours as needed.   venlafaxine XR 150 MG 24 hr capsule Commonly known as: Effexor XR Take 1 capsule (150 mg total) by mouth daily with breakfast.        Allergies:  Allergies  Allergen Reactions   Ibuprofen Swelling    Bilateral swelling of ankles and feet   Diflucan [Fluconazole] Other (See Comments)    Pelvic pain   Onion Other (See Comments)    migraines    Topamax [Topiramate] Other (See Comments)    Reaction:  Body aches     Omeprazole    Hydrocodone Anxiety, Palpitations and Other (See Comments)    Reaction:  Migraines     Past Medical History, Surgical history, Social history, and Family History were reviewed and updated.  Review of Systems: All other 10 point review of systems is negative.   Physical Exam:  weight is 233 lb (105.7 kg). Her oral temperature is 98.5 F (36.9 C). Her blood pressure is 152/89 (abnormal) and her pulse is 73. Her respiration is 17 and oxygen saturation is 94%.  Wt Readings from Last 3 Encounters:  12/02/20 233 lb (105.7 kg)  06/23/20 221 lb (100.2 kg)  06/05/20 220 lb (99.8 kg)    Ocular: Sclerae unicteric, pupils equal, round and reactive to light Ear-nose-throat: Oropharynx clear, dentition fair Lymphatic: No cervical or supraclavicular adenopathy Lungs no rales or rhonchi, good excursion bilaterally Heart regular rate and rhythm, no murmur appreciated Abd soft, nontender, positive bowel sounds MSK no focal spinal tenderness, no joint edema Neuro: non-focal, well-oriented, appropriate affect Breasts: Deferred   Lab Results  Component Value Date   WBC 10.3 12/02/2020   HGB 13.1 12/02/2020   HCT 38.4 12/02/2020   MCV 96.7 12/02/2020   PLT 293 12/02/2020   Lab Results  Component Value Date    FERRITIN 144 08/07/2020   IRON 65 08/07/2020   TIBC 336 08/07/2020   UIBC 272 08/07/2020   IRONPCTSAT 19 (L) 08/07/2020   Lab Results  Component Value Date   RETICCTPCT 2.1 08/07/2020   RBC 3.97 12/02/2020   No results found for: KPAFRELGTCHN, LAMBDASER, KAPLAMBRATIO No results found for: IGGSERUM, IGA, IGMSERUM No results found for: [name], SPEI   Chemistry      Component Value Date/Time   NA 137 05/04/2020 0503   NA 135 12/31/2019 1451   NA 139 03/13/2013 2022   K 3.9 05/04/2020 0503   K 3.6 03/13/2013 2022   CL 106 05/04/2020 0503   CL 106 03/13/2013 2022   CO2 23 05/04/2020 0503   CO2 26 03/13/2013 2022   BUN 9 05/04/2020 0503   BUN 11 12/31/2019 1451   BUN 11 03/13/2013 2022   CREATININE 0.84 05/04/2020 0503   CREATININE 1.05 (H) 05/24/2019 1359   CREATININE 0.80 03/13/2013 2022   GLU 94 11/23/2017 0000      Component Value Date/Time   CALCIUM 7.8 (L) 05/04/2020 0503   CALCIUM 8.7 03/13/2013 2022   ALKPHOS 101 05/04/2020 0503   AST 90 (H) 05/04/2020 0503   AST 27 05/24/2019 1359   ALT 216 (H) 05/04/2020 0503   ALT 32 05/24/2019 1359   BILITOT 1.1 05/04/2020 0503   BILITOT 0.3 12/31/2019 1451   BILITOT 0.3 05/24/2019 1359       Impression and Plan: Patricia Bauer is a very pleasant 50 yo caucasian female with iron deficiency anemia secondary to malabsorption.  Iron studies are pending. We will replace if needed.  Follow-up in 6 months.  She can contact our office with any questions or concerns.   Laverna Peace, NP 8/17/20222:26 PM

## 2020-12-02 NOTE — Telephone Encounter (Signed)
Per 12/02/20 los - gave upcoming appointments- confirmed

## 2020-12-03 LAB — FERRITIN: Ferritin: 199 ng/mL (ref 11–307)

## 2020-12-03 LAB — IRON AND TIBC
Iron: 88 ug/dL (ref 41–142)
Saturation Ratios: 27 % (ref 21–57)
TIBC: 320 ug/dL (ref 236–444)
UIBC: 232 ug/dL (ref 120–384)

## 2020-12-08 ENCOUNTER — Encounter: Payer: Self-pay | Admitting: Adult Health

## 2020-12-08 ENCOUNTER — Encounter: Payer: Self-pay | Admitting: Family Medicine

## 2020-12-08 ENCOUNTER — Other Ambulatory Visit: Payer: Self-pay

## 2020-12-08 ENCOUNTER — Ambulatory Visit (INDEPENDENT_AMBULATORY_CARE_PROVIDER_SITE_OTHER): Payer: 59 | Admitting: Adult Health

## 2020-12-08 DIAGNOSIS — F331 Major depressive disorder, recurrent, moderate: Secondary | ICD-10-CM | POA: Diagnosis not present

## 2020-12-08 DIAGNOSIS — F4000 Agoraphobia, unspecified: Secondary | ICD-10-CM

## 2020-12-08 DIAGNOSIS — F422 Mixed obsessional thoughts and acts: Secondary | ICD-10-CM | POA: Diagnosis not present

## 2020-12-08 DIAGNOSIS — F411 Generalized anxiety disorder: Secondary | ICD-10-CM | POA: Diagnosis not present

## 2020-12-08 DIAGNOSIS — F410 Panic disorder [episodic paroxysmal anxiety] without agoraphobia: Secondary | ICD-10-CM | POA: Diagnosis not present

## 2020-12-08 MED ORDER — ALPRAZOLAM 0.25 MG PO TABS: ORAL_TABLET | ORAL | 0 refills | Status: DC

## 2020-12-08 NOTE — Progress Notes (Signed)
SHELBEA BOLIS [id number] [birth year] 50 y.o.  Subjective:   Patient ID:  Patricia Bauer is a 50 y.o. (DOB 06/25/1970) female.  Chief Complaint: No chief complaint on file.  HPI  [name] presents to the office today for follow-up of MDD, GAD, insomnia, panic attacks, and agoraphobia,    Describes mood today as "ok". Pleasant. Tearful at times. Mood symptoms - reports decreased depression and anxiety. Still feels irritable. Feels like OCD is "worse" - picking at skin - scars on arms. Reports decreased panic attacks -1 to 2 times a week - using Xanax as needed. Mood has improved - "not as low". Feels like increase in Effexor has been helpful. Stating "I feel more stable". Feels like there is room for improvement. Decreased migraines. Varying interest and motivation. Taking medications as prescribed.  Energy levels lower - "still pretty horrible". Active, does not have a regular exercise routine.  Enjoys some usual interests and activities. Single. Divorced. Daughter (73), husband and 2 children - 3 and 18 months living with her. Spending time with family and friends. Appetite adequate. Weight stable. Sleeping difficulties - falling asleep and staying asleep. Averages 4 to 6 hours most days. Napping during the day.  Focus and concentration "difficulties" - "worse after 2 in the afternoon. Completing tasks. Managing aspects of household. Works full time for [employer].  Denies SI or HI.  Denies AH or VH. Therapist - Marya Fossa - x 2 years. 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  Previous medication trials:  Celexa, Zoloft - flat, Wellbutrin   GAD-7    Flowsheet Row Office Visit from 10/10/2019 in [place] at [facility] Visit from 03/28/2019 in [place]  Total GAD-7 Score 10 7      PHQ2-9    [place] Visit from 12/31/2019 in [place] at [facility] Visit  from 10/10/2019 in [place] at [facility] Visit from 03/28/2019 in [place] Visit from 07/24/2018 in [place] Visit from 04/23/2018 in [place]  PHQ-2 Total Score 2 0 0 0 0  PHQ-9 Total Score '8 8 7 '$ -- --        Review of Systems:  Review of Systems  Musculoskeletal:  Negative for gait problem.  Neurological:  Negative for tremors.  Psychiatric/Behavioral:         Please refer to HPI   Medications: I have reviewed the patient's current medications.  Current Outpatient Medications  Medication Sig Dispense Refill   ALPRAZolam (XANAX) 0.25 MG tablet TAKE 1 TABLET(0.25 MG) BY MOUTH DAILY AS NEEDED FOR ANXIETY OR SLEEP 30 tablet 0   folic acid (FOLVITE) 1 MG tablet Take 1 mg by mouth daily.     gabapentin (NEURONTIN) 300 MG capsule Take by mouth.     methotrexate (RHEUMATREX) 2.5 MG tablet SMARTSIG:5 Tablet(s) By Mouth Once a Week     SUMAtriptan (IMITREX) 50 MG tablet TAKE 1 TABLET MAY REPEAT IN 2 HOURS IF HEADACHE PERSISTS OR RECURS. DON'T USE MORE THAN TWO IN 24HRS 90 tablet 2   traMADol (ULTRAM) 50 MG tablet Take 1 tablet (50 mg total) by mouth every 6 (six) hours as needed. 15 tablet 0   venlafaxine XR (EFFEXOR XR) 150 MG 24 hr capsule Take 1 capsule (150 mg total) by mouth daily with breakfast. 30 capsule 5   No current facility-administered medications for this visit.  Medication Side Effects: None  Allergies:  Allergies  Allergen Reactions   Ibuprofen Swelling    Bilateral swelling of ankles and feet   Diflucan [Fluconazole] Other (See Comments)    Pelvic pain   Onion Other (See Comments)    migraines    Topamax [Topiramate] Other (See Comments)    Reaction:  Body aches     Omeprazole    Hydrocodone Anxiety, Palpitations and Other (See Comments)    Reaction:  Migraines     Past Medical History:  Diagnosis Date   Anemia    Anxiety    Colon polyp    Depression     GERD (gastroesophageal reflux disease)    Headache    migraines    History of hysterectomy    still has both ovaries   History of migraine headaches    PONV (postoperative nausea and vomiting)     Past Medical History, Surgical history, Social history, and Family history were reviewed and updated as appropriate.   Please see review of systems for further details on the patient's review from today.   Objective:   Physical Exam:  LMP 09/07/2009   Physical Exam Constitutional:      General: She is not in acute distress. Musculoskeletal:        General: No deformity.  Neurological:     Mental Status: She is alert and oriented to person, place, and time.     Coordination: Coordination normal.  Psychiatric:        Attention and Perception: Attention and perception normal. She does not perceive auditory or visual hallucinations.        Mood and Affect: Mood normal. Mood is not anxious or depressed. Affect is not labile, blunt, angry or inappropriate.        Speech: Speech normal.        Behavior: Behavior normal.        Thought Content: Thought content normal. Thought content is not paranoid or delusional. Thought content does not include homicidal or suicidal ideation. Thought content does not include homicidal or suicidal plan.        Cognition and Memory: Cognition and memory normal.        Judgment: Judgment normal.     Comments: Insight intact    Lab Review:     Component Value Date/Time   NA 137 05/04/2020 0503   NA 135 12/31/2019 1451   NA 139 03/13/2013 2022   K 3.9 05/04/2020 0503   K 3.6 03/13/2013 2022   CL 106 05/04/2020 0503   CL 106 03/13/2013 2022   CO2 23 05/04/2020 0503   CO2 26 03/13/2013 2022   GLUCOSE 110 (H) 05/04/2020 0503   GLUCOSE 94 03/13/2013 2022   BUN 9 05/04/2020 0503   BUN 11 12/31/2019 1451   BUN 11 03/13/2013 2022   CREATININE 0.84 05/04/2020 0503   CREATININE 1.05 (H) 05/24/2019 1359   CREATININE 0.80 03/13/2013 2022   CALCIUM 7.8  (L) 05/04/2020 0503   CALCIUM 8.7 03/13/2013 2022   PROT 5.9 (L) 05/04/2020 0503   PROT 6.9 12/31/2019 1451   ALBUMIN 2.8 (L) 05/04/2020 0503   ALBUMIN 4.4 12/31/2019 1451   AST 90 (H) 05/04/2020 0503   AST 27 05/24/2019 1359   ALT 216 (H) 05/04/2020 0503   ALT 32 05/24/2019 1359   ALKPHOS 101 05/04/2020 0503   BILITOT 1.1 05/04/2020 0503   BILITOT 0.3 12/31/2019 1451   BILITOT 0.3 05/24/2019 1359   GFRNONAA >60 05/04/2020 0503  GFRNONAA >60 05/24/2019 1359   GFRNONAA >60 03/13/2013 2022   GFRAA 89 12/31/2019 1451   GFRAA >60 05/24/2019 1359   GFRAA >60 03/13/2013 2022       Component Value Date/Time   WBC 10.3 12/02/2020 1400   WBC 8.4 05/04/2020 0503   RBC 3.97 12/02/2020 1400   HGB 13.1 12/02/2020 1400   HGB 11.9 01/17/2019 1519   HGB 12.0 01/17/2019 1519   HCT 38.4 12/02/2020 1400   HCT 36.1 01/17/2019 1519   HCT 36.5 01/17/2019 1519   PLT 293 12/02/2020 1400   PLT 333 01/17/2019 1519   PLT 338 01/17/2019 1519   MCV 96.7 12/02/2020 1400   MCV 82 01/17/2019 1519   MCV 83 01/17/2019 1519   MCV 85 03/13/2013 2022   MCH 33.0 12/02/2020 1400   MCHC 34.1 12/02/2020 1400   RDW 13.7 12/02/2020 1400   RDW 14.1 01/17/2019 1519   RDW 14.2 01/17/2019 1519   RDW 13.4 03/13/2013 2022   LYMPHSABS 3.2 12/02/2020 1400   LYMPHSABS 2.8 01/17/2019 1519   LYMPHSABS 3.0 01/17/2019 1519   MONOABS 0.6 12/02/2020 1400   EOSABS 0.2 12/02/2020 1400   EOSABS 0.2 01/17/2019 1519   EOSABS 0.2 01/17/2019 1519   BASOSABS 0.1 12/02/2020 1400   BASOSABS 0.0 01/17/2019 1519   BASOSABS 0.0 01/17/2019 1519    No results found for: POCLITH, LITHIUM   No results found for: PHENYTOIN, PHENOBARB, VALPROATE, CBMZ   .res Assessment: Plan:    Plan:  PDMP reviewed  Continue Effexor XR '150mg'$  every morning Xanax 0.'25mg'$  as needed  RTC 4 weeks  Patient advised to contact office with any questions, adverse effects, or acute worsening in signs and symptoms.    Diagnoses and all orders  for this visit:  Mixed obsessional thoughts and acts  Panic disorder without agoraphobia -     ALPRAZolam (XANAX) 0.25 MG tablet; TAKE 1 TABLET(0.25 MG) BY MOUTH DAILY AS NEEDED FOR ANXIETY OR SLEEP  Panic attacks  Major depressive disorder, recurrent episode, moderate (HCC)  GAD (generalized anxiety disorder)  Agoraphobia    Please see After Visit Summary for patient specific instructions.  Future Appointments  Date Time Provider [place]  12/11/2020  2:00 PM Gerri Lins [facility] None  06/04/2021  1:30 PM [facility] None  06/04/2021  1:45 PM [place], Holli Humbles, NP [facility] None    No orders of the defined types were placed in this encounter.   -------------------------------

## 2020-12-11 ENCOUNTER — Ambulatory Visit (INDEPENDENT_AMBULATORY_CARE_PROVIDER_SITE_OTHER): Payer: 59 | Admitting: Psychology

## 2020-12-11 DIAGNOSIS — F410 Panic disorder [episodic paroxysmal anxiety] without agoraphobia: Secondary | ICD-10-CM | POA: Diagnosis not present

## 2020-12-30 ENCOUNTER — Encounter: Payer: Self-pay | Admitting: Family Medicine

## 2020-12-30 DIAGNOSIS — M2550 Pain in unspecified joint: Secondary | ICD-10-CM

## 2020-12-30 DIAGNOSIS — M0609 Rheumatoid arthritis without rheumatoid factor, multiple sites: Secondary | ICD-10-CM

## 2021-01-01 ENCOUNTER — Other Ambulatory Visit: Payer: Self-pay

## 2021-01-01 ENCOUNTER — Ambulatory Visit (INDEPENDENT_AMBULATORY_CARE_PROVIDER_SITE_OTHER): Payer: 59

## 2021-01-01 DIAGNOSIS — Z23 Encounter for immunization: Secondary | ICD-10-CM | POA: Diagnosis not present

## 2021-01-15 ENCOUNTER — Encounter: Payer: Self-pay | Admitting: General Surgery

## 2021-01-19 ENCOUNTER — Telehealth (INDEPENDENT_AMBULATORY_CARE_PROVIDER_SITE_OTHER): Payer: 59 | Admitting: Adult Health

## 2021-01-19 ENCOUNTER — Encounter: Payer: Self-pay | Admitting: Adult Health

## 2021-01-19 DIAGNOSIS — F4000 Agoraphobia, unspecified: Secondary | ICD-10-CM

## 2021-01-19 DIAGNOSIS — F331 Major depressive disorder, recurrent, moderate: Secondary | ICD-10-CM | POA: Diagnosis not present

## 2021-01-19 DIAGNOSIS — F410 Panic disorder [episodic paroxysmal anxiety] without agoraphobia: Secondary | ICD-10-CM | POA: Diagnosis not present

## 2021-01-19 DIAGNOSIS — F422 Mixed obsessional thoughts and acts: Secondary | ICD-10-CM | POA: Diagnosis not present

## 2021-01-19 DIAGNOSIS — F411 Generalized anxiety disorder: Secondary | ICD-10-CM

## 2021-01-19 MED ORDER — ALPRAZOLAM 0.25 MG PO TABS: ORAL_TABLET | ORAL | 0 refills | Status: DC

## 2021-01-19 NOTE — Progress Notes (Signed)
Patricia Bauer [id number] 07/09/1970 50 y.o.  Virtual Visit via Video Note  I connected with pt @ on 01/19/21 at  5:20 PM EDT by a video enabled telemedicine application and verified that I am speaking with the correct person using two identifiers.   I discussed the limitations of evaluation and management by telemedicine and the availability of in person appointments. The patient expressed understanding and agreed to proceed.  I discussed the assessment and treatment plan with the patient. The patient was provided an opportunity to ask questions and all were answered. The patient agreed with the plan and demonstrated an understanding of the instructions.   The patient was advised to call back or seek an in-person evaluation if the symptoms worsen or if the condition fails to improve as anticipated.  I provided 25 minutes of non-face-to-face time during this encounter.  The patient was located at home.  The provider was located at [place].   Aloha Gell, NP   Subjective:   Patient ID:  Patricia Bauer is a 50 y.o. (DOB March 28, 1971) female.  Chief Complaint: No chief complaint on file.   HPI Patricia Bauer presents for follow-up of MDD, GAD, insomnia, panic attacks, and agoraphobia,    Describes mood today as "ok". Pleasant. Tearful at times. Mood symptoms - reports decreased depression - "more so on the weekend". Feels less anxious - denies panic attack. Denies irritability - "daughter would say differently". Stating "my OCD is worse". Has been trying to stop picking - "picking my neck now". Feels like Effexor is helpful and does not want to make any changes. Stating "I'm ok for right now". Varying interest and motivation. Taking medications as prescribed.  Energy levels lower - "maybe a little better". Active, does not have a regular exercise routine.  Enjoys some usual interests and activities. Single. Divorced. Daughter (42) 2 children - 4 and 24 months.  Spending time with family and friends. Appetite a little increased - "eating my feelings. Weight stable. Sleeping difficulties. Averages 4 hours at nights then 2 to 3 hours in the afternoon.  Focus and concentration "a little better". Completing tasks. Managing aspects of household. Works full time for [employer].  Denies SI or HI.  Denies AH or VH. Therapist - Marya Fossa - x 2 years.  Diagnosed with Fibromylagia and RA. Diagnosed with tachyphylaxis.  Previous medication trials:  Celexa, Zoloft - flat, Wellbutrin   Review of Systems:  Review of Systems  Musculoskeletal:  Negative for gait problem.  Neurological:  Negative for tremors.  Psychiatric/Behavioral:         Please refer to HPI   Medications: I have reviewed the patient's current medications.  Current Outpatient Medications  Medication Sig Dispense Refill   ALPRAZolam (XANAX) 0.25 MG tablet TAKE 1 TABLET(0.25 MG) BY MOUTH DAILY AS NEEDED FOR ANXIETY OR SLEEP 30 tablet 0   folic acid (FOLVITE) 1 MG tablet Take 1 mg by mouth daily.     gabapentin (NEURONTIN) 300 MG capsule Take by mouth.     methotrexate (RHEUMATREX) 2.5 MG tablet SMARTSIG:5 Tablet(s) By Mouth Once a Week     SUMAtriptan (IMITREX) 50 MG tablet TAKE 1 TABLET MAY REPEAT IN 2 HOURS IF HEADACHE PERSISTS OR RECURS. DON'T USE MORE THAN TWO IN 24HRS 90 tablet 2   traMADol (ULTRAM) 50 MG tablet Take 1 tablet (50 mg total) by mouth every 6 (six) hours as needed. 15 tablet 0   venlafaxine XR (EFFEXOR XR) 150  MG 24 hr capsule Take 1 capsule (150 mg total) by mouth daily with breakfast. 30 capsule 5   No current facility-administered medications for this visit.    Medication Side Effects: None  Allergies:  Allergies  Allergen Reactions   Ibuprofen Swelling    Bilateral swelling of ankles and feet   Diflucan [Fluconazole] Other (See Comments)    Pelvic pain   Onion Other (See Comments)    migraines    Topamax [Topiramate] Other (See Comments)     Reaction:  Body aches     Omeprazole    Hydrocodone Anxiety, Palpitations and Other (See Comments)    Reaction:  Migraines     Past Medical History:  Diagnosis Date   Anemia    Anxiety    Colon polyp    Depression    GERD (gastroesophageal reflux disease)    Headache    migraines    History of hysterectomy    still has both ovaries   History of migraine headaches    PONV (postoperative nausea and vomiting)     Family History  Problem Relation Age of Onset   Stroke Father    Heart attack Father    Hyperlipidemia Mother    Hypertension Mother    Breast cancer Maternal Aunt 75   Colon cancer Neg Hx    Colon polyps Neg Hx    Kidney disease Neg Hx    Gallbladder disease Neg Hx    Esophageal cancer Neg Hx    Alcohol abuse Neg Hx    Drug abuse Neg Hx    Depression Neg Hx    Bipolar disorder Neg Hx    Anxiety disorder Neg Hx    Schizophrenia Neg Hx    Suicidality Neg Hx     Social History   Socioeconomic History   Marital status: Single    Spouse name: Not on file   Number of children: 1   Years of education: Not on file   Highest education level: Not on file  Occupational History   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[employer]  Tobacco Use   Smoking status: Never   Smokeless tobacco: Never  Vaping Use   Vaping Use: Never used  Substance and Sexual Activity   Alcohol use: Yes    Alcohol/week: 0.0 standard drinks    Comment: occassional- once a month has about 2-3 drinks   Drug use: No   Sexual activity: Never  Other Topics Concern   Not on file  Social History Narrative   Not on file   Social Determinants of Health   Financial Resource Strain: Not on file  Food Insecurity: Not on file  Transportation Needs: Not on file  Physical Activity: Not on file  Stress: Not on file  Social Connections: Not on file  Intimate Partner Violence: Not on file    Past Medical History, Surgical history, Social history, and Family history were reviewed and updated as  appropriate.   Please see review of systems for further details on the patient's review from today.   Objective:   Physical Exam:  LMP 09/07/2009   Physical Exam Constitutional:      General: She is not in acute distress. Musculoskeletal:        General: No deformity.  Neurological:     Mental Status: She is alert and oriented to person, place, and time.     Coordination: Coordination normal.  Psychiatric:        Attention and Perception: Attention and perception normal. She does  not perceive auditory or visual hallucinations.        Mood and Affect: Mood normal. Mood is not anxious or depressed. Affect is not labile, blunt, angry or inappropriate.        Speech: Speech normal.        Behavior: Behavior normal.        Thought Content: Thought content normal. Thought content is not paranoid or delusional. Thought content does not include homicidal or suicidal ideation. Thought content does not include homicidal or suicidal plan.        Cognition and Memory: Cognition and memory normal.        Judgment: Judgment normal.     Comments: Insight intact    Lab Review:     Component Value Date/Time   NA 137 05/04/2020 0503   NA 135 12/31/2019 1451   NA 139 03/13/2013 2022   K 3.9 05/04/2020 0503   K 3.6 03/13/2013 2022   CL 106 05/04/2020 0503   CL 106 03/13/2013 2022   CO2 23 05/04/2020 0503   CO2 26 03/13/2013 2022   GLUCOSE 110 (H) 05/04/2020 0503   GLUCOSE 94 03/13/2013 2022   BUN 9 05/04/2020 0503   BUN 11 12/31/2019 1451   BUN 11 03/13/2013 2022   CREATININE 0.84 05/04/2020 0503   CREATININE 1.05 (H) 05/24/2019 1359   CREATININE 0.80 03/13/2013 2022   CALCIUM 7.8 (L) 05/04/2020 0503   CALCIUM 8.7 03/13/2013 2022   PROT 5.9 (L) 05/04/2020 0503   PROT 6.9 12/31/2019 1451   ALBUMIN 2.8 (L) 05/04/2020 0503   ALBUMIN 4.4 12/31/2019 1451   AST 90 (H) 05/04/2020 0503   AST 27 05/24/2019 1359   ALT 216 (H) 05/04/2020 0503   ALT 32 05/24/2019 1359   ALKPHOS 101  05/04/2020 0503   BILITOT 1.1 05/04/2020 0503   BILITOT 0.3 12/31/2019 1451   BILITOT 0.3 05/24/2019 1359   GFRNONAA >60 05/04/2020 0503   GFRNONAA >60 05/24/2019 1359   GFRNONAA >60 03/13/2013 2022   GFRAA 89 12/31/2019 1451   GFRAA >60 05/24/2019 1359   GFRAA >60 03/13/2013 2022       Component Value Date/Time   WBC 10.3 12/02/2020 1400   WBC 8.4 05/04/2020 0503   RBC 3.97 12/02/2020 1400   HGB 13.1 12/02/2020 1400   HGB 11.9 01/17/2019 1519   HGB 12.0 01/17/2019 1519   HCT 38.4 12/02/2020 1400   HCT 36.1 01/17/2019 1519   HCT 36.5 01/17/2019 1519   PLT 293 12/02/2020 1400   PLT 333 01/17/2019 1519   PLT 338 01/17/2019 1519   MCV 96.7 12/02/2020 1400   MCV 82 01/17/2019 1519   MCV 83 01/17/2019 1519   MCV 85 03/13/2013 2022   MCH 33.0 12/02/2020 1400   MCHC 34.1 12/02/2020 1400   RDW 13.7 12/02/2020 1400   RDW 14.1 01/17/2019 1519   RDW 14.2 01/17/2019 1519   RDW 13.4 03/13/2013 2022   LYMPHSABS 3.2 12/02/2020 1400   LYMPHSABS 2.8 01/17/2019 1519   LYMPHSABS 3.0 01/17/2019 1519   MONOABS 0.6 12/02/2020 1400   EOSABS 0.2 12/02/2020 1400   EOSABS 0.2 01/17/2019 1519   EOSABS 0.2 01/17/2019 1519   BASOSABS 0.1 12/02/2020 1400   BASOSABS 0.0 01/17/2019 1519   BASOSABS 0.0 01/17/2019 1519    No results found for: POCLITH, LITHIUM   No results found for: PHENYTOIN, PHENOBARB, VALPROATE, CBMZ   .res Assessment: Plan:    Plan:  PDMP reviewed  Continue Effexor XR 150mg  every  morning Xanax 0.25mg  as needed  RTC 4 weeks  Patient advised to contact office with any questions, adverse effects, or acute worsening in signs and symptoms.  Discussed potential benefits, risk, and side effects of benzodiazepines to include potential risk of tolerance and dependence, as well as possible drowsiness.  Advised patient not to drive if experiencing drowsiness and to take lowest possible effective dose to minimize risk of dependence and tolerance.    Diagnoses and all  orders for this visit:  Panic disorder without agoraphobia -     ALPRAZolam (XANAX) 0.25 MG tablet; TAKE 1 TABLET(0.25 MG) BY MOUTH DAILY AS NEEDED FOR ANXIETY OR SLEEP  Mixed obsessional thoughts and acts  Panic attacks  Major depressive disorder, recurrent episode, moderate (HCC)  GAD (generalized anxiety disorder)  Agoraphobia    Please see After Visit Summary for patient specific instructions.  Future Appointments  Date Time Provider [place]  01/21/2021  2:00 PM [name], MD [facility]  04/21/2021  3:00 PM Rubie Maid, MD [facility] None  06/04/2021  1:30 PM [facility] None  06/04/2021  1:45 PM Celso Amy, NP [facility] None  07/13/2021  3:00 PM [name], MD [facility] None    No orders of the defined types were placed in this encounter.     -------------------------------

## 2021-01-21 ENCOUNTER — Ambulatory Visit (INDEPENDENT_AMBULATORY_CARE_PROVIDER_SITE_OTHER): Payer: 59 | Admitting: Family Medicine

## 2021-01-21 ENCOUNTER — Other Ambulatory Visit: Payer: Self-pay

## 2021-01-21 VITALS — BP 128/88 | HR 86 | Temp 97.0°F | Ht 64.25 in | Wt 232.1 lb

## 2021-01-21 DIAGNOSIS — Z0000 Encounter for general adult medical examination without abnormal findings: Secondary | ICD-10-CM | POA: Diagnosis not present

## 2021-01-21 DIAGNOSIS — M0609 Rheumatoid arthritis without rheumatoid factor, multiple sites: Secondary | ICD-10-CM | POA: Diagnosis not present

## 2021-01-21 DIAGNOSIS — I10 Essential (primary) hypertension: Secondary | ICD-10-CM | POA: Diagnosis not present

## 2021-01-21 NOTE — Progress Notes (Signed)
Annual Exam   Chief Complaint:  Chief Complaint  Patient presents with   Annual Exam    No concerns     History of Present Illness:  [name] is a 50 y.o. [id number] who LMP was Patient's last menstrual period was 09/07/2009., presents today for her annual examination.     Nutrition Diet: hit or miss, not great Exercise: not currently, limited with pain  She does not get adequate calcium and Vitamin D in her diet.   Social History   Tobacco Use  Smoking Status Never  Smokeless Tobacco Never   Social History   Substance and Sexual Activity  Alcohol Use Yes   Alcohol/week: 0.0 standard drinks   Comment: occassional- once a month has about 2-3 drinks   Social History   Substance and Sexual Activity  Drug Use No    Safety The patient wears seatbelts: yes.     The patient feels safe at home and in their relationships: yes.  General Health Dentist in the last year: Yes Eye doctor: yes  Menstrual S/p hysterectomy  GYN She is not sexually active.    Cervical Cancer Screening:   Last Pap:   June 2019 Results were: no abnormalities /neg HPV DNA - not sure if this was done   Breast Cancer Screening There is no FH of breast cancer. There is no FH of ovarian cancer. BRCA screening Not Indicated.  Discussed that for average risk women between age 12-49 screening may reduce the risk of breast cancer death, however, at a lower rate than those over age 60. And that the the false-positive rates resulting in unnecessary biopsies with more screening is higher. The balance of benefits vs harms likely improves as you progress through your 40s. The patient does want a mammogram this year.   Colon Cancer Screening:  Age 99-75 yo - benefits outweigh the risk. Adults 69-85 yo who have never been screened benefit.  Benefits: 134000 people in 2016 will be diagnosed and 49,000 will die - early detection helps Harms: Complications 2/2 to colonoscopy High Risk  (Colonoscopy): genetic disorder (Lynch syndrome or familial adenomatous polyposis), personal hx of IBD, previous adenomatous polyp, or previous colorectal cancer, FamHx start 10 years before the age at diagnosis, increased in males and black race  Options:  FIT - looks for hemoglobin (blood in the stool) - specific and fairly sensitive - must be done annually Cologuard - looks for DNA and blood - more sensitive - therefore can have more false positives, every 3 years Colonoscopy - every 10 years if normal - sedation, bowl prep, must have someone drive you  Shared decision making and the patient had decided to do colonscopy 2024.  Weight Wt Readings from Last 3 Encounters:  01/21/21 232 lb 2 oz (105.3 kg)  12/02/20 233 lb (105.7 kg)  06/23/20 221 lb (100.2 kg)   Patient has high BMI  BMI Readings from Last 1 Encounters:  01/21/21 39.53 kg/m     Chronic disease screening Blood pressure monitoring:  BP Readings from Last 3 Encounters:  01/21/21 128/88  12/02/20 (!) 152/89  08/28/20 (!) 143/75    Lipid Monitoring: Indication for screening: age >103, obesity, diabetes, family hx, CV risk factors.  Lipid screening: Yes  Lab Results  Component Value Date   CHOL 230 (H) 12/31/2019   HDL 45 12/31/2019   LDLCALC 143 (H) 12/31/2019   TRIG 231 (H) 12/31/2019   CHOLHDL 5.1 (H) 12/31/2019     Diabetes Screening:  age >52, overweight, family hx, PCOS, hx of gestational diabetes, at risk ethnicity Diabetes Screening screening: Yes  Lab Results  Component Value Date   HGBA1C 6.4 (H) 12/31/2019     Past Medical History:  Diagnosis Date   Anemia    Anxiety    Colon polyp    Depression    GERD (gastroesophageal reflux disease)    Headache    migraines    History of hysterectomy    still has both ovaries   History of migraine headaches    PONV (postoperative nausea and vomiting)     Past Surgical History:  Procedure Laterality Date   ABDOMINAL HYSTERECTOMY      COLONOSCOPY     KNEE ARTHROPLASTY     KNEE ARTHROSCOPY     knee athroscopy  5/12   LT   LAPAROSCOPIC VAGINAL HYSTERECTOMY  5/11   Supra cervical   left partial knee replacement     PARTIAL KNEE ARTHROPLASTY Left 02/10/2014   Procedure: LEFT MEDIAL UNICOMPARTMENTAL KNEE ARTHROPLASTY;  Surgeon: Gearlean Alf, MD;  Location: [facility];  Service: Orthopedics;  Laterality: Left;   UPPER GI ENDOSCOPY     WISDOM TOOTH EXTRACTION  1995    Prior to Admission medications   Medication Sig Start Date End Date Taking? Authorizing Provider  ALPRAZolam (XANAX) 0.25 MG tablet TAKE 1 TABLET(0.25 MG) BY MOUTH DAILY AS NEEDED FOR ANXIETY OR SLEEP 01/19/21  Yes Mozingo, Berdie Ogren, NP  folic acid (FOLVITE) 1 MG tablet Take 1 mg by mouth daily. 11/09/20  Yes [provider]  gabapentin (NEURONTIN) 300 MG capsule Take by mouth. 11/01/20  Yes [provider]  methotrexate (RHEUMATREX) 2.5 MG tablet SMARTSIG:5 Tablet(s) By Mouth Once a Week 11/05/20  Yes [provider]  SUMAtriptan (IMITREX) 50 MG tablet TAKE 1 TABLET MAY REPEAT IN 2 HOURS IF HEADACHE PERSISTS OR RECURS. DON'T USE MORE THAN TWO IN 24HRS 12/11/19  Yes [name], MD  traMADol (ULTRAM) 50 MG tablet Take 1 tablet (50 mg total) by mouth every 6 (six) hours as needed. 02/07/20  Yes Margarette Canada, NP  venlafaxine XR (EFFEXOR XR) 150 MG 24 hr capsule Take 1 capsule (150 mg total) by mouth daily with breakfast. 11/10/20  Yes Mozingo, Berdie Ogren, NP    Allergies  Allergen Reactions   Ibuprofen Swelling    Bilateral swelling of ankles and feet   Diflucan [Fluconazole] Other (See Comments)    Pelvic pain   Onion Other (See Comments)    migraines    Topamax [Topiramate] Other (See Comments)    Reaction:  Body aches     Omeprazole    Hydrocodone Anxiety, Palpitations and Other (See Comments)    Reaction:  Migraines     Gynecologic History: Patient's last menstrual period was 09/07/2009.  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[employer]  Tobacco Use   Smoking status: Never   Smokeless tobacco: Never  Vaping Use   Vaping Use: Never used  Substance and Sexual Activity   Alcohol use: Yes    Alcohol/week: 0.0 standard drinks    Comment: occassional- once a month has about 2-3 drinks   Drug use: No   Sexual activity: Never  Other Topics Concern  Not on file  Social History Narrative   Not on file   Social Determinants of Health   Financial Resource Strain: Not on file  Food Insecurity: Not on file  Transportation Needs: Not on file  Physical Activity: Not on file  Stress: Not on file  Social Connections: Not on file  Intimate Partner Violence: Not on file    Family History  Problem Relation Age of Onset   Stroke Father    Heart attack Father    Hyperlipidemia Mother    Hypertension Mother    Breast cancer Maternal Aunt 75   Colon cancer Neg Hx    Colon polyps Neg Hx    Kidney disease Neg Hx    Gallbladder disease Neg Hx    Esophageal cancer Neg Hx    Alcohol abuse Neg Hx    Drug abuse Neg Hx    Depression Neg Hx    Bipolar disorder Neg Hx    Anxiety disorder Neg Hx    Schizophrenia Neg Hx    Suicidality Neg Hx     Review of Systems  Constitutional:  Negative for chills and fever.  HENT:  Negative for congestion and sore throat.   Eyes:  Negative for blurred vision and double vision.  Respiratory:  Negative for shortness of breath.   Cardiovascular:  Negative for chest pain.  Gastrointestinal:  Negative for heartburn, nausea and vomiting.  Genitourinary: Negative.   Musculoskeletal: Negative.  Negative for myalgias.  Skin:  Negative for rash.  Neurological:  Negative for dizziness and headaches.  Endo/Heme/Allergies:  Does not  bruise/bleed easily.  Psychiatric/Behavioral:  Negative for depression. The patient is not nervous/anxious.     Physical Exam BP 128/88   Pulse 86   Temp (!) 97 F (36.1 C) (Temporal)   Ht 5' 4.25" (1.632 m)   Wt 232 lb 2 oz (105.3 kg)   LMP 09/07/2009   SpO2 97%   BMI 39.53 kg/m    BP Readings from Last 3 Encounters:  01/21/21 128/88  12/02/20 (!) 152/89  08/28/20 (!) 143/75      Physical Exam Constitutional:      General: She is not in acute distress.    Appearance: She is well-developed. She is not diaphoretic.  HENT:     Head: Normocephalic and atraumatic.     Right Ear: External ear normal.     Left Ear: External ear normal.     Nose: Nose normal.  Eyes:     General: No scleral icterus.    Extraocular Movements: Extraocular movements intact.     Conjunctiva/sclera: Conjunctivae normal.  Cardiovascular:     Rate and Rhythm: Normal rate and regular rhythm.     Heart sounds: No murmur heard. Pulmonary:     Effort: Pulmonary effort is normal. No respiratory distress.     Breath sounds: Normal breath sounds. No wheezing.  Abdominal:     General: Bowel sounds are normal. There is no distension.     Palpations: Abdomen is soft. There is no mass.     Tenderness: There is no abdominal tenderness. There is no guarding or rebound.  Musculoskeletal:        General: Normal range of motion.     Cervical back: Neck supple.  Lymphadenopathy:     Cervical: No cervical adenopathy.  Skin:    General: Skin is warm and dry.     Capillary Refill: Capillary refill takes less than 2 seconds.  Neurological:     Mental Status:  She is alert and oriented to person, place, and time.     Deep Tendon Reflexes: Reflexes normal.  Psychiatric:        Mood and Affect: Mood normal.        Behavior: Behavior normal.     Results:  PHQ-9:  [place] Office Visit from 01/21/2021 in [place] at [place]  PHQ-9 Total Score 14         Assessment: 50 y.o. [id number]  female here for routine annual physical examination.  Plan: Problem List Items Addressed This Visit       Musculoskeletal and Integument   Rheumatoid arthritis of multiple sites with negative rheumatoid factor ([place])    Some improvement with metotrexate but is awaiting a new rheumatologist referral for 2nd opinion. Appreciate rheum support        Other   Morbid obesity ([place])    C/b HTN and depression. Work on [employer], exercise and weight loss      Relevant Orders   Comprehensive metabolic panel   Hemoglobin A1c   Lipid panel   TSH   Other Visit Diagnoses     Annual physical exam    -  Primary   Essential hypertension       Relevant Orders   Comprehensive metabolic panel   Hemoglobin A1c   Lipid panel   TSH       Screening: -- Blood pressure screen  life style changes -- cholesterol screening: will obtain -- Weight screening: obese: discussed management options, including lifestyle, dietary, and exercise. -- Diabetes Screening: will obtain -- Nutrition: Encouraged healthy diet  The 10-year ASCVD risk score (Arnett DK, et al., 2019) is: 1.8%   Values used to calculate the score:     Age: 38 years     Sex: Female     Is Non-Hispanic African American: No     Diabetic: No     Tobacco smoker: No     Systolic Blood Pressure: 469 mmHg     Is BP treated: No     HDL Cholesterol: 45 mg/dL     Total Cholesterol: 230 mg/dL  -- Statin therapy for Age 75-75 with CVD risk >7.5%  Psych -- Depression screening (PHQ-9):  [place] Visit from 01/21/2021 in [place] at [place]  PHQ-9 Total Score 14        Safety -- tobacco screening: not using -- alcohol screening:  low-risk usage. -- no evidence of domestic violence or intimate partner violence.   Cancer Screening -- pap smear not collected per ASCCP guidelines -- family history of breast cancer screening: done. not at high risk. -- Mammogram -  up to date -- Colon cancer (age  31+)--  up to date  Immunizations Immunization History  Administered Date(s) Administered   Influenza,inj,Quad PF,6+ Mos 01/02/2015, 01/29/2016, 01/17/2017, 01/10/2018, 01/17/2019, 01/01/2021   Influenza-Unspecified 12/31/2019   PFIZER(Purple Top)SARS-COV-2 Vaccination 07/19/2019, 08/12/2019    -- flu vaccine up to date -- TDAP q10 years not up to date - declined today -- -- Covid-19 Vaccine up to date   Encouraged healthy diet and exercise. Encouraged regular vision and dental care.   [name], MD

## 2021-01-21 NOTE — Patient Instructions (Signed)
Your blood pressure high.   High blood pressure increases your risk for heart attack and stroke.    Please check your blood pressure 2-4 times a week.   To check your blood pressure 1) Sit in a quiet and relaxed place for 5 minutes 2) Make sure your feet are flat on the ground 3) Consider checking first thing in the morning   Normal blood pressure is less than 140/90 Ideally you blood pressure should be around 120/80  Other ways you can reduce your blood pressure:  1) Regular exercise -- Try to get 150 minutes (30 minutes, 5 days a week) of moderate to vigorous aerobic excercise -- Examples: brisk walking (2.5 miles per hour), water aerobics, dancing, gardening, tennis, biking slower than 10 miles per hour 2) DASH Diet - low fat meats, more fresh fruits and vegetables, whole grains, low salt 3) Quit smoking if you smoke 4) Loose 5-10% of your body weight

## 2021-01-21 NOTE — Assessment & Plan Note (Signed)
Some improvement with metotrexate but is awaiting a new rheumatologist referral for 2nd opinion. Appreciate rheum support

## 2021-01-21 NOTE — Assessment & Plan Note (Signed)
C/b HTN and depression. Work on [employer], exercise and weight loss

## 2021-01-22 ENCOUNTER — Encounter: Payer: Self-pay | Admitting: Family Medicine

## 2021-01-22 LAB — COMPREHENSIVE METABOLIC PANEL
ALT: 62 IU/L — ABNORMAL HIGH (ref 0–32)
AST: 40 IU/L (ref 0–40)
Albumin/Globulin Ratio: 1.8 (ref 1.2–2.2)
Albumin: 4.6 g/dL (ref 3.8–4.8)
Alkaline Phosphatase: 97 IU/L (ref 44–121)
BUN/Creatinine Ratio: 19 (ref 9–23)
BUN: 15 mg/dL (ref 6–24)
Bilirubin Total: 0.2 mg/dL (ref 0.0–1.2)
CO2: 22 mmol/L (ref 20–29)
Calcium: 9.8 mg/dL (ref 8.7–10.2)
Chloride: 98 mmol/L (ref 96–106)
Creatinine, Ser: 0.81 mg/dL (ref 0.57–1.00)
Globulin, Total: 2.5 g/dL (ref 1.5–4.5)
Glucose: 88 mg/dL (ref 70–99)
Potassium: 4.6 mmol/L (ref 3.5–5.2)
Sodium: 137 mmol/L (ref 134–144)
Total Protein: 7.1 g/dL (ref 6.0–8.5)
eGFR: 89 mL/min/{1.73_m2} (ref >59)

## 2021-01-22 LAB — LIPID PANEL
Chol/HDL Ratio: 4.4 ratio (ref 0.0–4.4)
Cholesterol, Total: 230 mg/dL — ABNORMAL HIGH (ref 100–199)
HDL: 52 mg/dL (ref >39)
LDL Chol Calc (NIH): 144 mg/dL — ABNORMAL HIGH (ref 0–99)
Triglycerides: 191 mg/dL — ABNORMAL HIGH (ref 0–149)
VLDL Cholesterol Cal: 34 mg/dL (ref 5–40)

## 2021-01-22 LAB — HEMOGLOBIN A1C
Est. average glucose Bld gHb Est-mCnc: 126 mg/dL
Hgb A1c MFr Bld: 6 % — ABNORMAL HIGH (ref 4.8–5.6)

## 2021-01-22 LAB — TSH: TSH: 2.24 u[IU]/mL (ref 0.450–4.500)

## 2021-02-08 ENCOUNTER — Encounter: Payer: Self-pay | Admitting: Family Medicine

## 2021-02-22 ENCOUNTER — Encounter: Payer: Self-pay | Admitting: Family

## 2021-02-22 NOTE — Progress Notes (Signed)
GYNECOLOGY ANNUAL PHYSICAL EXAM PROGRESS NOTE  Subjective:    Patricia Bauer is a 50 y.o. [phone] female who presents to establish care and for an annual exam. Previously seen at [facility]. . The patient is not currently sexually active. The patient participates in regular exercise: not currently (just had knee surgery last week). Has the patient ever been transfused or tattooed?: no. The patient reports that there is not domestic violence in her life.   The patient has the following complaints today:  Complains about increased libido.  Notes that this has been ongoing off and on for at least a year or two. Reports that it lasts for ~ 2 months at a time and then returns to normal for ~ 1 month before revving up again.  Does not masturbate or use toys (notes it is against her religion).   Menstrual History: Menarche age: 41 Patient's last menstrual period was 09/07/2009.   Gynecologic History:  Contraception: status post hysterectomy (supracervical)  History of STI's: Denies Last Pap: ~ 3 years ago per patient. Results were: normal.  Notes a remote h/o abnormal pap smear. Last mammogram: 07/07/2020. Results were: normal Last colonoscopy: 11/2017.  Results were: normal (except internal hemorrhoids present).     OB History  Gravida Para Term Preterm AB Living  4 2 2  0 2 1  SAB IAB Ectopic Multiple Live Births  2 0 0 0 0    # Outcome Date GA Lbr Len/2nd Weight Sex Delivery Anes PTL Lv  4 Term           3 SAB           2 SAB           1 Term             Past Medical History:  Diagnosis Date   Anemia    Anxiety    Colon polyp    Depression    GERD (gastroesophageal reflux disease)    Headache    migraines    History of hysterectomy    still has both ovaries and cervix   History of migraine headaches    History of ovarian cyst    PONV (postoperative nausea and vomiting)     Past Surgical History:  Procedure Laterality Date   COLONOSCOPY     KNEE  ARTHROPLASTY     KNEE ARTHROSCOPY     knee athroscopy  08/2010   LT   LAPAROSCOPIC SUPRACERVICAL HYSTERECTOMY     LAPAROSCOPIC VAGINAL HYSTERECTOMY  08/2009   Supra cervical   left partial knee replacement     PARTIAL KNEE ARTHROPLASTY Left 02/10/2014   Procedure: LEFT MEDIAL UNICOMPARTMENTAL KNEE ARTHROPLASTY;  Surgeon: Gearlean Alf, MD;  Location: [facility];  Service: Orthopedics;  Laterality: Left;   UPPER GI ENDOSCOPY     WISDOM TOOTH EXTRACTION  1995    Family History  Problem Relation Age of Onset   Stroke Father    Heart attack Father    Hyperlipidemia Mother    Hypertension Mother    Breast cancer Maternal Aunt 75   Colon cancer Neg Hx    Colon polyps Neg Hx    Kidney disease Neg Hx    Gallbladder disease Neg Hx    Esophageal cancer Neg Hx    Alcohol abuse Neg Hx    Drug abuse Neg Hx    Depression Neg Hx    Bipolar disorder Neg Hx    Anxiety disorder Neg  Hx    Schizophrenia Neg Hx    Suicidality Neg Hx     Social History   Socioeconomic History   Marital status: Single    Spouse name: Not on file   Number of children: 1   Years of education: Not on file   Highest education level: Not on file  Occupational History   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[employer]  Tobacco Use   Smoking status: Never   Smokeless tobacco: Never  Vaping Use   Vaping Use: Never used  Substance and Sexual Activity   Alcohol use: Yes    Alcohol/week: 0.0 standard drinks    Comment: occassional- once a month has about 2-3 drinks   Drug use: No   Sexual activity: Never  Other Topics Concern   Not on file  Social History Narrative   Not on file   Social Determinants of Health   Financial Resource Strain: Not on file  Food Insecurity: Not on file  Transportation Needs: Not on file  Physical Activity: Not on file  Stress: Not on file  Social Connections: Not on file  Intimate Partner Violence: Not on file    Current Outpatient Medications on File Prior to Visit  Medication  Sig Dispense Refill   ALPRAZolam (XANAX) 0.25 MG tablet TAKE 1 TABLET(0.25 MG) BY MOUTH DAILY AS NEEDED FOR ANXIETY OR SLEEP 30 tablet 0   folic acid (FOLVITE) 1 MG tablet Take 1 mg by mouth daily.     gabapentin (NEURONTIN) 300 MG capsule Take by mouth.     methotrexate (RHEUMATREX) 2.5 MG tablet SMARTSIG:5 Tablet(s) By Mouth Once a Week     SUMAtriptan (IMITREX) 50 MG tablet TAKE 1 TABLET MAY REPEAT IN 2 HOURS IF HEADACHE PERSISTS OR RECURS. DON'T USE MORE THAN TWO IN 24HRS 90 tablet 2   traMADol (ULTRAM) 50 MG tablet Take 1 tablet (50 mg total) by mouth every 6 (six) hours as needed. 15 tablet 0   venlafaxine XR (EFFEXOR XR) 150 MG 24 hr capsule Take 1 capsule (150 mg total) by mouth daily with breakfast. 30 capsule 5   No current facility-administered medications on file prior to visit.    Allergies  Allergen Reactions   Ibuprofen Swelling    Bilateral swelling of ankles and feet   Diflucan [Fluconazole] Other (See Comments)    Pelvic pain   Onion Other (See Comments)    migraines    Topamax [Topiramate] Other (See Comments)    Reaction:  Body aches     Omeprazole    Hydrocodone Anxiety, Palpitations and Other (See Comments)    Reaction:  Migraines     Review of Systems Constitutional: negative for chills, fatigue, fevers and sweats Eyes: negative for irritation, redness and visual disturbance Ears, nose, mouth, throat, and face: negative for hearing loss, nasal congestion, snoring and tinnitus Respiratory: negative for asthma, cough, sputum Cardiovascular: negative for chest pain, dyspnea, exertional chest pressure/discomfort, irregular heart beat, palpitations and syncope Gastrointestinal: negative for abdominal pain, change in bowel habits, nausea and vomiting Genitourinary: negative for abnormal menstrual periods, genital lesions, sexual problems and vaginal discharge, dysuria and urinary incontinence. Positive for increased libido (happens every ~ 2 months).   Integument/breast: negative for breast lump, breast tenderness and nipple discharge Hematologic/lymphatic: negative for bleeding and easy bruising Musculoskeletal:negative for back pain and muscle weakness Neurological: negative for dizziness, headaches, vertigo and weakness Endocrine: negative for diabetic symptoms including polydipsia, polyuria and skin dryness Allergic/Immunologic: negative for hay fever and urticaria    Psychological  ROS: negative for - anxiety, depression, irritability, mood swings, sleep disturbances, or suicidal ideation    Objective:  Blood pressure (!) 141/107, pulse (!) 114, resp. rate 16, height 5' 4.5" (1.638 m), weight 234 lb 3.2 oz (106.2 kg), last menstrual period 09/07/2009. Body mass index is 39.58 kg/m.   General Appearance:    Alert, cooperative, no distress, appears stated age, moderate obesity  Head:    Normocephalic, without obvious abnormality, atraumatic  Eyes:    PERRL, conjunctiva/corneas clear, EOM's intact, both eyes  Ears:    Normal external ear canals, both ears  Nose:   Nares normal, septum midline, mucosa normal, no drainage or sinus tenderness  Throat:   Lips, mucosa, and tongue normal; teeth and gums normal  Neck:   Supple, symmetrical, trachea midline, no adenopathy; thyroid: no enlargement/tenderness/nodules; no carotid bruit or JVD  Back:     Symmetric, no curvature, ROM normal, no CVA tenderness  Lungs:     Clear to auscultation bilaterally, respirations unlabored  Chest Wall:    No tenderness or deformity   Heart:    Regular rate and rhythm, S1 and S2 normal, no murmur, rub or gallop  Breast Exam:    No tenderness, masses, or nipple abnormality  Abdomen:     Soft, non-tender, bowel sounds active all four quadrants, no masses, no organomegaly.    Genitalia:    Pelvic:external genitalia normal, vagina without lesions, discharge, or tenderness, rectovaginal septum  normal. Cervix normal in appearance, no cervical motion tenderness, no  adnexal masses or tenderness.  Uterus surgically absent.   Rectal:    Normal external sphincter.  No hemorrhoids appreciated. Internal exam not done.   Extremities:   Extremities normal, atraumatic, no cyanosis or edema  Pulses:   2+ and symmetric all extremities  Skin:   Skin color, texture, turgor normal, no rashes or lesions  Lymph nodes:   Cervical, supraclavicular, and axillary nodes normal  Neurologic:   CNII-XII intact, normal strength, sensation and reflexes throughout   .  Labs:  Lab Results  Component Value Date   WBC 10.3 12/02/2020   HGB 13.1 12/02/2020   HCT 38.4 12/02/2020   MCV 96.7 12/02/2020   PLT 293 12/02/2020    Lab Results  Component Value Date   CREATININE 0.81 01/21/2021   BUN 15 01/21/2021   NA 137 01/21/2021   K 4.6 01/21/2021   CL 98 01/21/2021   CO2 22 01/21/2021    Lab Results  Component Value Date   ALT 62 (H) 01/21/2021   AST 40 01/21/2021   ALKPHOS 97 01/21/2021   BILITOT 0.2 01/21/2021    Lab Results  Component Value Date   TSH 2.240 01/21/2021     Assessment:   1. Elevated blood pressure reading   2. Encounter for medical examination to establish care   3. Well woman exam with routine gynecological exam   4. Excessive sexual drive   5. Cervical cancer screening      Plan:  - Blood tests: none ordered, has labs with PCP. - Breast self exam technique reviewed and patient encouraged to perform self-exam monthly. - Contraception: status post hysterectomy (supracervical) . - Discussed healthy lifestyle modifications. - Mammogram  up to date - Pap smear ordered. - Colon screening: up to date. Repeat q 5 years due to personal h/o hereditary adenomatous polyposis - COVID vaccination status: Received Pfizer vaccine series and first booster.  - Flu vaccine: up to date.  - Discussed concerns of increased libido.  Does not desire to utilize masturbation due to religious reasons. Advised on exercise (once her knee is fully healed),  cold-showers. Also can check hormone levels to assess for any endocrine causes of higher libido. - Follow up in 1 year for annual exam   Rubie Maid, MD [facility]

## 2021-02-23 ENCOUNTER — Other Ambulatory Visit (HOSPITAL_COMMUNITY)
Admission: RE | Admit: 2021-02-23 | Discharge: 2021-02-23 | Disposition: A | Discharge status: Home / Self Care | Payer: 59 | Source: Ambulatory Visit | Attending: Obstetrics and Gynecology | Admitting: Obstetrics and Gynecology

## 2021-02-23 ENCOUNTER — Ambulatory Visit: Payer: 59 | Admitting: Obstetrics and Gynecology

## 2021-02-23 ENCOUNTER — Encounter: Payer: Self-pay | Admitting: Obstetrics and Gynecology

## 2021-02-23 ENCOUNTER — Other Ambulatory Visit: Payer: Self-pay

## 2021-02-23 VITALS — BP 141/107 | HR 114 | Resp 16 | Ht 64.5 in | Wt 234.2 lb

## 2021-02-23 DIAGNOSIS — F528 Other sexual dysfunction not due to a substance or known physiological condition: Secondary | ICD-10-CM | POA: Diagnosis not present

## 2021-02-23 DIAGNOSIS — Z124 Encounter for screening for malignant neoplasm of cervix: Secondary | ICD-10-CM | POA: Diagnosis present

## 2021-02-23 DIAGNOSIS — R030 Elevated blood-pressure reading, without diagnosis of hypertension: Secondary | ICD-10-CM

## 2021-02-23 DIAGNOSIS — Z0000 Encounter for general adult medical examination without abnormal findings: Secondary | ICD-10-CM | POA: Diagnosis not present

## 2021-02-23 DIAGNOSIS — Z01419 Encounter for gynecological examination (general) (routine) without abnormal findings: Secondary | ICD-10-CM | POA: Insufficient documentation

## 2021-02-24 ENCOUNTER — Encounter: Payer: Self-pay | Admitting: Podiatry

## 2021-02-24 ENCOUNTER — Ambulatory Visit: Payer: 59 | Admitting: Podiatry

## 2021-02-24 DIAGNOSIS — M722 Plantar fascial fibromatosis: Secondary | ICD-10-CM | POA: Diagnosis not present

## 2021-02-24 DIAGNOSIS — M2011 Hallux valgus (acquired), right foot: Secondary | ICD-10-CM | POA: Diagnosis not present

## 2021-02-24 DIAGNOSIS — D2372 Other benign neoplasm of skin of left lower limb, including hip: Secondary | ICD-10-CM

## 2021-02-24 DIAGNOSIS — D2371 Other benign neoplasm of skin of right lower limb, including hip: Secondary | ICD-10-CM | POA: Diagnosis not present

## 2021-02-24 DIAGNOSIS — M6740 Ganglion, unspecified site: Secondary | ICD-10-CM

## 2021-02-24 MED ORDER — TRIAMCINOLONE ACETONIDE 40 MG/ML IJ SUSP
20.0000 mg | Freq: Once | INTRAMUSCULAR | Status: AC
Start: 2021-02-24 — End: 2021-02-24
  Administered 2021-02-24: 20 mg

## 2021-02-24 NOTE — Progress Notes (Signed)
She presents today states that she is got calluses on her big toes mostly she is got right foot and ankle pain states that I think it got a ganglion on her toe as she points to the third toe of the right foot.  Objective: Vital signs are stable she is alert and oriented x3.  Pulses are palpable.  Neurologic sensorium is intact Deetjen reflexes are intact muscle strength is normal symmetrical.  She has pain on palpation medial calcaneal tubercle of the right heel she has some tenderness on end range of motion of the subtalar joint right.'s very mild ankle joint tenderness.  And she does have a very small mucoid cyst to the distal aspect of her DIPJ third right.  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  Hallux abductovalgus deformity needing repair at some point resulting in some tenderness of the second metatarsophalangeal joint.  Plan: Discussed etiology pathology conservative versus surgical therapies I highly recommended oral anti-inflammatories steroidal and nonsteroidal and she declined.  At this point I did inject her heel with 10 mg of Kenalog 5 mg Marcaine to the point of maximal tenderness.  I did place her in plantar fascial brace and we are referring her to [facility] hopefully this will help alleviate her symptoms and should it not we will get an MRI to confirm that there is no tear.  Just having an arthroscopy of the knee we did discuss eventual surgery to the right bunion which would generally be at least 6 months after the rehab of the knee itself.  Most likely she would be in a cast or boot nonweightbearing for a period of time.  I will follow-up with her in a few weeks once physical therapy is complete.

## 2021-02-25 ENCOUNTER — Encounter: Payer: Self-pay | Admitting: Obstetrics and Gynecology

## 2021-02-25 LAB — TESTOSTERONE, FREE, TOTAL, SHBG
Sex Hormone Binding: 19.4 nmol/L — ABNORMAL LOW (ref 24.6–122.0)
Testosterone, Free: 1.3 pg/mL (ref 0.0–4.2)
Testosterone: 11 ng/dL (ref 4–50)

## 2021-02-25 LAB — ESTRADIOL: Estradiol: 36.6 pg/mL

## 2021-02-25 LAB — FSH/LH
FSH: 34 m[IU]/mL
LH: 41.8 m[IU]/mL

## 2021-02-25 LAB — PROGESTERONE: Progesterone: 0.1 ng/mL

## 2021-03-03 LAB — CYTOLOGY - PAP
Comment: NEGATIVE
Diagnosis: UNDETERMINED — AB
High risk HPV: NEGATIVE

## 2021-04-12 ENCOUNTER — Encounter: Payer: Self-pay | Admitting: Family Medicine

## 2021-04-18 IMAGING — MG DIGITAL DIAGNOSTIC BILAT W/ TOMO W/ CAD
6 of 10 series · 6 of 30 positions shown · non-contrast
Comparison: Previous exam(s).

CLINICAL DATA: Mass felt by the patient in the 3 o'clock position
of the left breast for the past 2 weeks. She reports a history of
breast cysts.

EXAM:
DIGITAL DIAGNOSTIC BILATERAL MAMMOGRAM WITH TOMOSYNTHESIS AND CAD;
ULTRASOUND LEFT BREAST LIMITED
TECHNIQUE: Bilateral digital diagnostic mammography and breast tomosynthesis
was performed. The images were evaluated with computer-aided
detection.; Targeted ultrasound examination of the left breast was
performed

[L CC synth-2D]
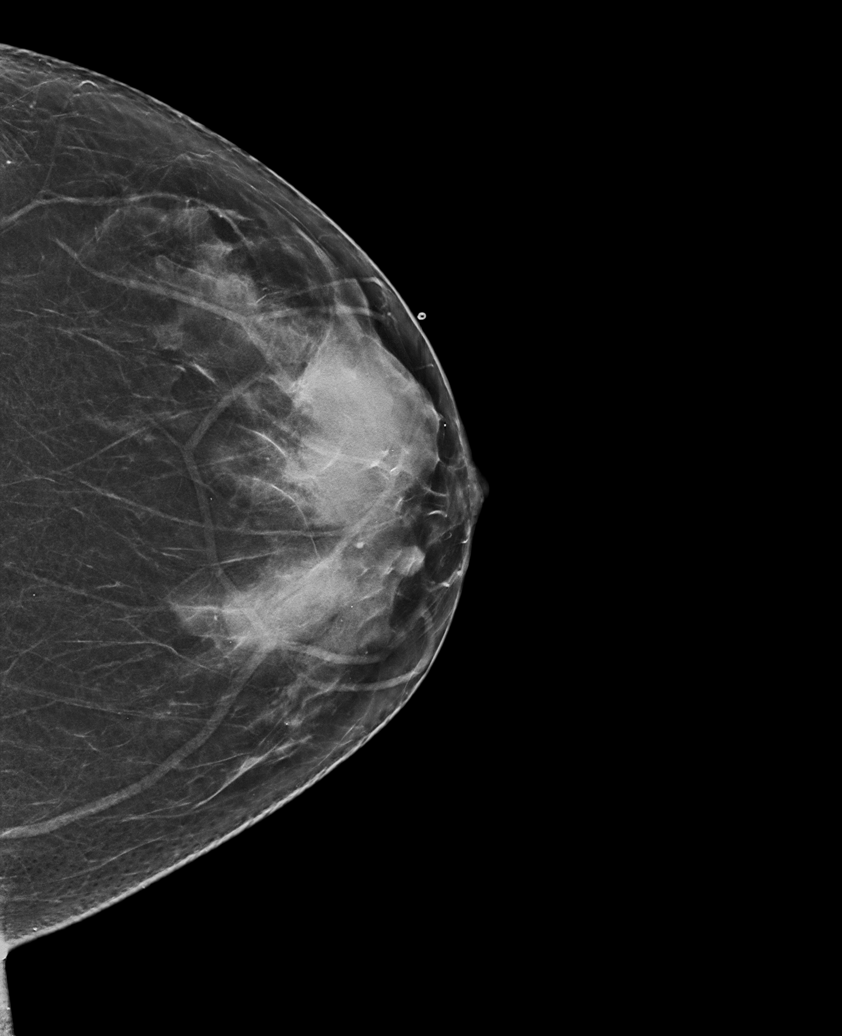

[R CC synth-2D]
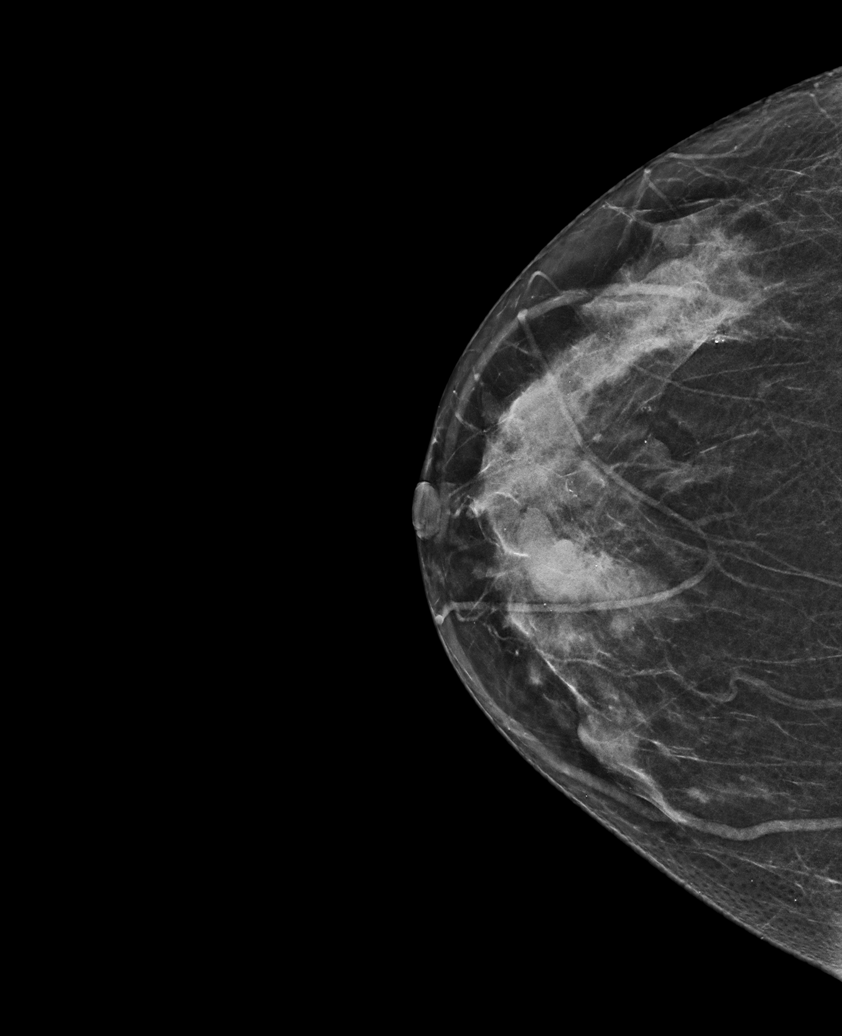

[L TAN synth-2D]
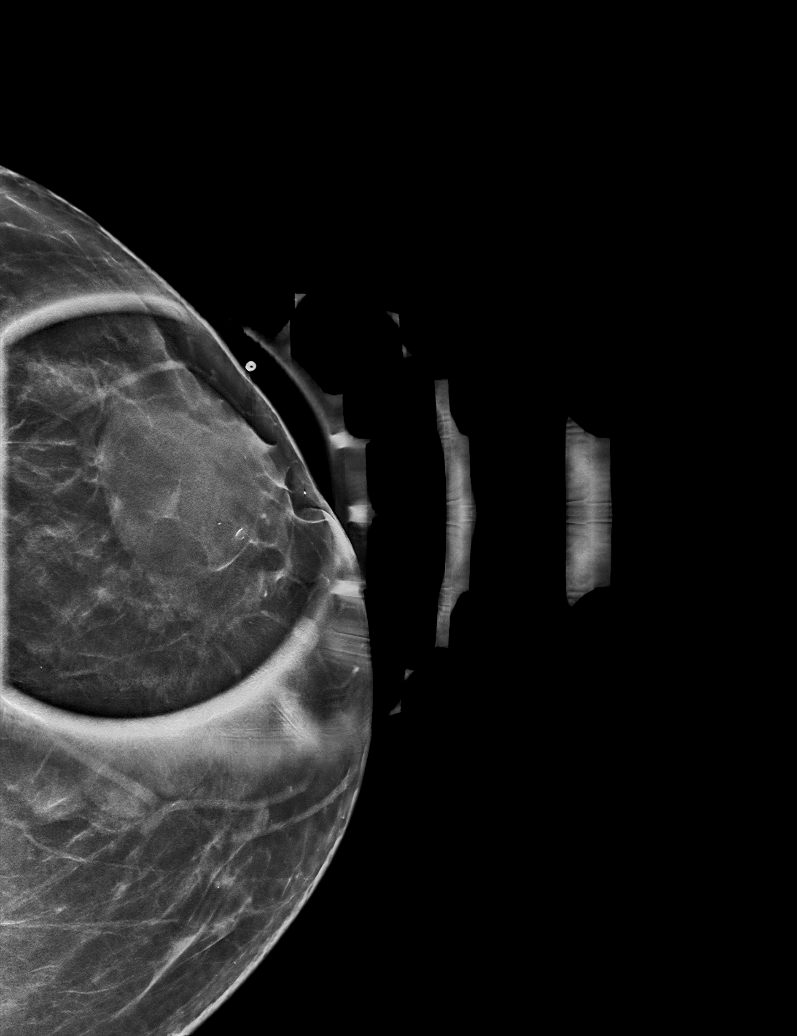

[L MLO synth-2D]
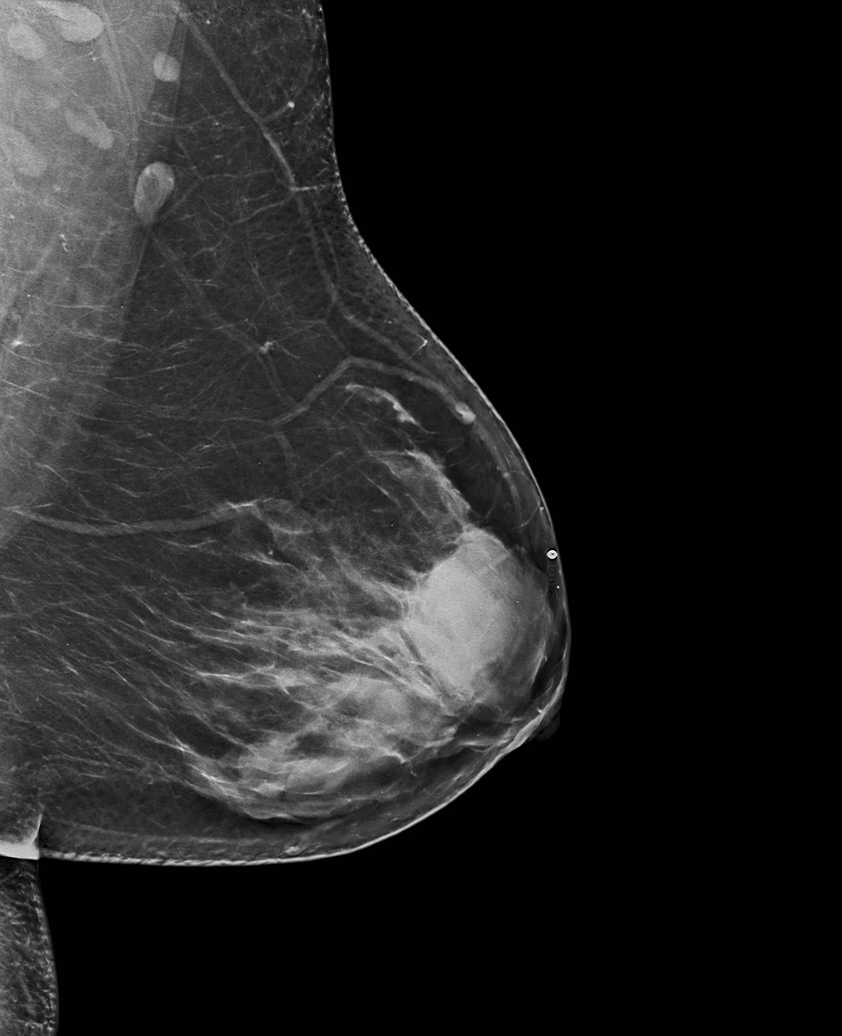

[R MLO synth-2D]
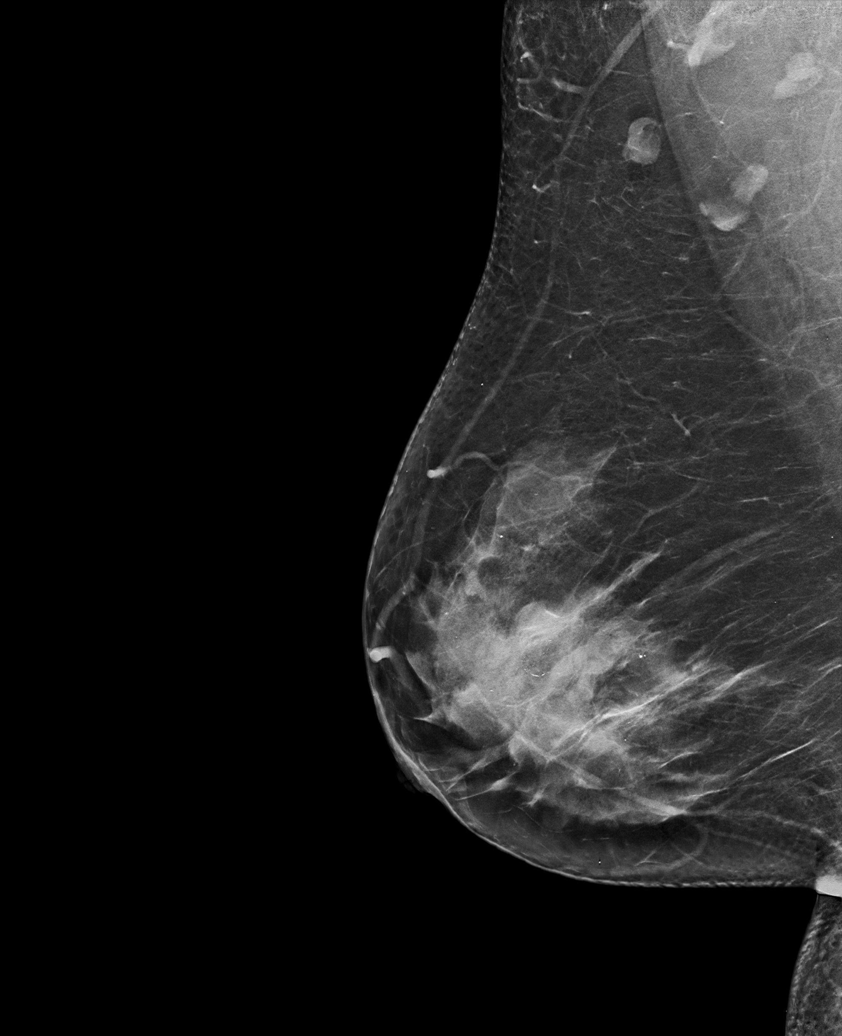

[L TAN tomo · tomo slice 31/60.0]
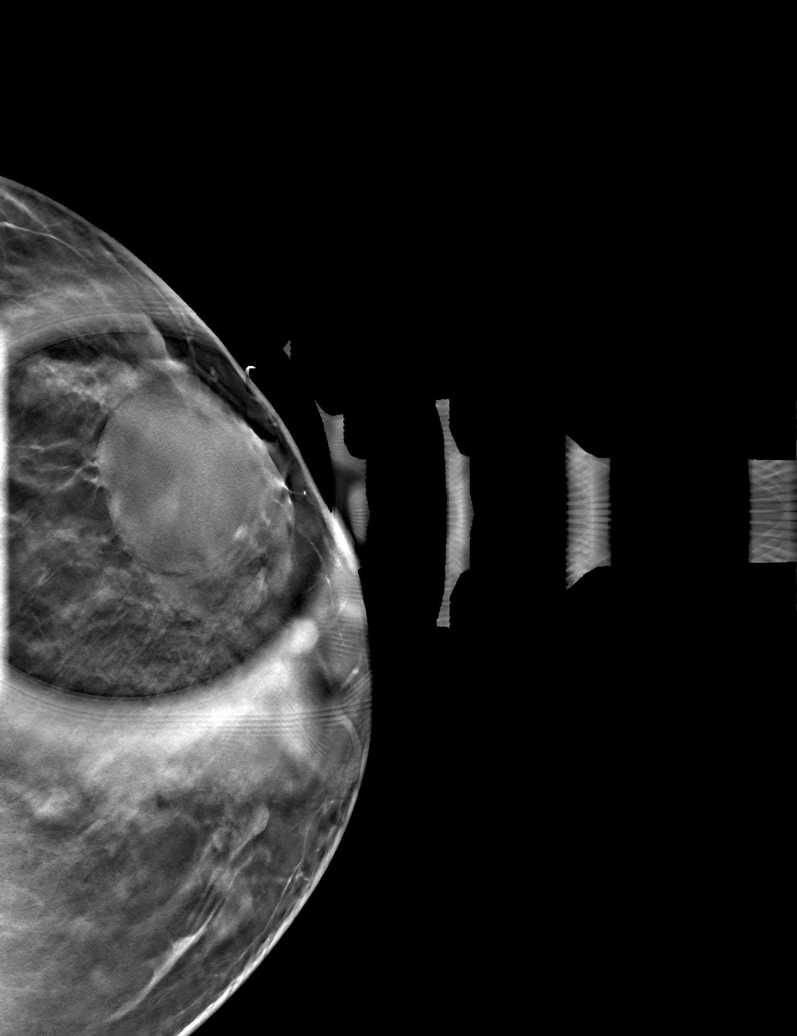

[6 of 30 positions shown; findings below may reference images not displayed]

ACR Breast Density Category c: The breast tissue is heterogeneously
dense, which may obscure small masses.
FINDINGS: Multiple waxing and waning, oval and rounded, circumscribed masses
in both breasts. These include the palpable area on the left, marked
with a metallic marker. No interval findings suspicious for
malignancy.

On physical exam, the patient has an approximately 2.5 cm rounded,
palpable mass in the 1 o'clock position of the left breast, 2 cm
from the nipple.

Targeted ultrasound is performed, showing a 3.4 cm cyst containing
diffuse internal echoes in the 1 o'clock position of the left
breast, 2 cm from the nipple. The internal echoes are swirling at
real-time. No internal blood flow was seen with color Doppler. There
were smaller adjacent similar appearing cysts.
IMPRESSION: 1. Benign left breast cysts.
2. Multiple additional benign masses in both breasts compatible with
cysts.
3. No evidence of malignancy in either breast.

RECOMMENDATION:
Bilateral screening mammogram in 1 year.

I have discussed the findings and recommendations with the patient.
If applicable, a reminder letter will be sent to the patient
regarding the next appointment.

BI-RADS CATEGORY  2: Benign.

## 2021-04-21 ENCOUNTER — Encounter: Payer: 59 | Admitting: Podiatry

## 2021-04-21 ENCOUNTER — Encounter: Payer: Self-pay | Admitting: Obstetrics and Gynecology

## 2021-04-23 ENCOUNTER — Other Ambulatory Visit: Payer: Self-pay

## 2021-04-23 ENCOUNTER — Telehealth (INDEPENDENT_AMBULATORY_CARE_PROVIDER_SITE_OTHER): Payer: 59 | Admitting: Family

## 2021-04-23 VITALS — Ht 64.5 in | Wt 235.0 lb

## 2021-04-23 DIAGNOSIS — J40 Bronchitis, not specified as acute or chronic: Secondary | ICD-10-CM

## 2021-04-23 DIAGNOSIS — J45909 Unspecified asthma, uncomplicated: Secondary | ICD-10-CM | POA: Insufficient documentation

## 2021-04-23 MED ORDER — AMOXICILLIN-POT CLAVULANATE 875-125 MG PO TABS: 1.0000 | ORAL_TABLET | Freq: Two times a day (BID) | ORAL | 0 refills | Status: AC

## 2021-04-23 NOTE — Progress Notes (Signed)
MyChart Video Visit    Virtual Visit via Video Note   This visit type was conducted due to national recommendations for restrictions regarding the COVID-19 Pandemic (e.g. social distancing) in an effort to limit this patient's exposure and mitigate transmission in our community. This patient is at least at moderate risk for complications without adequate follow up. This format is felt to be most appropriate for this patient at this time. Physical exam was limited by quality of the video and audio technology used for the visit. CMA was able to get the patient set up on a video visit.  Patient location: Home. Patient and provider in visit Provider location: Office  I discussed the limitations of evaluation and management by telemedicine and the availability of in person appointments. The patient expressed understanding and agreed to proceed.  Visit Date: 04/23/2021  Today's healthcare provider: Eugenia Pancoast, FNP     Subjective:    [name], female    DOB: 1970-07-25, 51 y.o.   MRN: [id number]  Chief Complaint  Patient presents with   Cough   Sore Throat   Nasal Congestion    HPI  50 y/o female via video with c/o two week h/o sore throat, nasal congestion, and cough which is productive with milky green sputum. No ear pain but with bil ear fullness, no fever. No sinus pressure or pain. She does report mild sob but only after really bad coughing fit. No known history of asthma.   Home covid test was negative 10 days ago. No one at home positive for covid or flu but all are sick in her home as well. She does find it is hard to take a deep breath at times.   Past Medical History:  Diagnosis Date   Anemia    Anxiety    Colon polyp    Depression    GERD (gastroesophageal reflux disease)    Headache    migraines    History of hysterectomy    still has both ovaries and cervix   History of migraine headaches    History of ovarian cyst    PONV  (postoperative nausea and vomiting)     Past Surgical History:  Procedure Laterality Date   COLONOSCOPY     KNEE ARTHROPLASTY     KNEE ARTHROSCOPY     knee athroscopy  08/2010   LT   LAPAROSCOPIC SUPRACERVICAL HYSTERECTOMY     LAPAROSCOPIC VAGINAL HYSTERECTOMY  08/2009   Supra cervical   left partial knee replacement     PARTIAL KNEE ARTHROPLASTY Left 02/10/2014   Procedure: LEFT MEDIAL UNICOMPARTMENTAL KNEE ARTHROPLASTY;  Surgeon: Gearlean Alf, MD;  Location: [facility];  Service: Orthopedics;  Laterality: Left;   UPPER GI ENDOSCOPY     WISDOM TOOTH EXTRACTION  1995    Family History  Problem Relation Age of Onset   Stroke Father    Heart attack Father    Hyperlipidemia Mother    Hypertension Mother    Breast cancer Maternal Aunt 75   Colon cancer Neg Hx    Colon polyps Neg Hx    Kidney disease Neg Hx    Gallbladder disease Neg Hx    Esophageal cancer Neg Hx    Alcohol abuse Neg Hx    Drug abuse Neg Hx    Depression Neg Hx    Bipolar disorder Neg Hx    Anxiety disorder Neg Hx    Schizophrenia Neg Hx    Suicidality Neg Hx  Social History   Socioeconomic History   Marital status: Single    Spouse name: Not on file   Number of children: 1   Years of education: Not on file   Highest education level: Not on file  Occupational History   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[employer]  Tobacco Use   Smoking status: Never   Smokeless tobacco: Never  Vaping Use   Vaping Use: Never used  Substance and Sexual Activity   Alcohol use: Yes    Alcohol/week: 0.0 standard drinks    Comment: occassional- once a month has about 2-3 drinks   Drug use: No   Sexual activity: Never  Other Topics Concern   Not on file  Social History Narrative   Not on file   Social Determinants of Health   Financial Resource Strain: Not on file  Food Insecurity: Not on file  Transportation Needs: Not on file  Physical Activity: Not on file  Stress: Not on file  Social Connections: Not on  file  Intimate Partner Violence: Not on file    Outpatient Medications Prior to Visit  Medication Sig Dispense Refill   ALPRAZolam (XANAX) 0.25 MG tablet TAKE 1 TABLET(0.25 MG) BY MOUTH DAILY AS NEEDED FOR ANXIETY OR SLEEP 30 tablet 0   folic acid (FOLVITE) 1 MG tablet Take 1 mg by mouth daily.     gabapentin (NEURONTIN) 300 MG capsule Take by mouth.     SUMAtriptan (IMITREX) 50 MG tablet TAKE 1 TABLET MAY REPEAT IN 2 HOURS IF HEADACHE PERSISTS OR RECURS. DON'T USE MORE THAN TWO IN 24HRS 90 tablet 2   traMADol (ULTRAM) 50 MG tablet Take 1 tablet (50 mg total) by mouth every 6 (six) hours as needed. 15 tablet 0   venlafaxine XR (EFFEXOR XR) 150 MG 24 hr capsule Take 1 capsule (150 mg total) by mouth daily with breakfast. 30 capsule 5   No facility-administered medications prior to visit.    Allergies  Allergen Reactions   Ibuprofen Swelling    Bilateral swelling of ankles and feet   Diflucan [Fluconazole] Other (See Comments)    Pelvic pain   Onion Other (See Comments)    migraines    Topamax [Topiramate] Other (See Comments)    Reaction:  Body aches     Omeprazole    Hydrocodone Anxiety, Palpitations and Other (See Comments)    Reaction:  Migraines     Review of Systems  Constitutional:  Negative for chills and fever.  HENT:  Positive for congestion, ear pain and sore throat.   Respiratory:  Positive for cough, sputum production (milky green) and shortness of breath (with coughing fits).   Cardiovascular:  Negative for chest pain.  Musculoskeletal:  Myalgias: bil ear fullness.  All other systems reviewed and are negative.     Objective:    Physical Exam Constitutional:      General: She is not in acute distress.    Appearance: She is well-developed. She is obese. She is not ill-appearing, toxic-appearing or diaphoretic.  HENT:     Head: Normocephalic.     Mouth/Throat:     Comments: hoarseness Pulmonary:     Effort: Pulmonary effort is normal.  Neurological:      General: No focal deficit present.     Mental Status: She is alert and oriented to person, place, and time.  Psychiatric:        Mood and Affect: Mood normal.        Behavior: Behavior normal.    Ht  5' 4.5" (1.638 m)    Wt 235 lb (106.6 kg)    LMP 09/07/2009    BMI 39.71 kg/m  Wt Readings from Last 3 Encounters:  04/23/21 235 lb (106.6 kg)  02/23/21 234 lb 3.2 oz (106.2 kg)  01/21/21 232 lb 2 oz (105.3 kg)       Assessment & Plan:   Problem List Items Addressed This Visit       Respiratory   Bronchitis - Primary    Take antibiotic as prescribed. Increase oral fluids. Pt to f/u if sx worsen and or fail to improve in 2-3 days.       Relevant Medications   amoxicillin-clavulanate (AUGMENTIN) 875-125 MG tablet    I am having Patricia Asal. Bauer start on amoxicillin-clavulanate. I am also having her maintain her SUMAtriptan, traMADol, gabapentin, folic acid, venlafaxine XR, and ALPRAZolam.  Meds ordered this encounter  Medications   amoxicillin-clavulanate (AUGMENTIN) 875-125 MG tablet    Sig: Take 1 tablet by mouth 2 (two) times daily for 10 days.    Dispense:  20 tablet    Refill:  0    Order Specific Question:   Supervising Provider    Answer:   BEDSOLE, AMY E [[id number]]    I discussed the assessment and treatment plan with the patient. The patient was provided an opportunity to ask questions and all were answered. The patient agreed with the plan and demonstrated an understanding of the instructions.   The patient was advised to call back or seek an in-person evaluation if the symptoms worsen or if the condition fails to improve as anticipated.  I provided 19 minutes of face-to-face time during this encounter.   Eugenia Pancoast, [place] [facility] [place] [phone] (phone) [phone] (fax)  [place]

## 2021-04-23 NOTE — Assessment & Plan Note (Signed)
Take antibiotic as prescribed. Increase oral fluids. Pt to f/u if sx worsen and or fail to improve in 2-3 days.  

## 2021-04-25 ENCOUNTER — Encounter: Payer: Self-pay | Admitting: Family

## 2021-04-25 DIAGNOSIS — J40 Bronchitis, not specified as acute or chronic: Secondary | ICD-10-CM

## 2021-04-26 MED ORDER — PROMETHAZINE-DM 6.25-15 MG/5ML PO SYRP: 5.0000 mL | ORAL_SOLUTION | Freq: Four times a day (QID) | ORAL | 0 refills | Status: AC | PRN

## 2021-05-04 ENCOUNTER — Other Ambulatory Visit: Payer: Self-pay

## 2021-05-04 ENCOUNTER — Encounter: Payer: Self-pay | Admitting: Family

## 2021-05-04 ENCOUNTER — Ambulatory Visit (INDEPENDENT_AMBULATORY_CARE_PROVIDER_SITE_OTHER): Payer: 59

## 2021-05-04 ENCOUNTER — Ambulatory Visit: Payer: 59 | Admitting: Family

## 2021-05-04 ENCOUNTER — Ambulatory Visit
Admission: RE | Admit: 2021-05-04 | Discharge: 2021-05-04 | Disposition: A | Discharge status: Home / Self Care | Payer: 59 | Source: Ambulatory Visit | Attending: Family | Admitting: Family

## 2021-05-04 VITALS — BP 156/102 | HR 87 | Temp 97.6°F | Ht 64.5 in | Wt 236.0 lb

## 2021-05-04 DIAGNOSIS — R053 Chronic cough: Secondary | ICD-10-CM | POA: Diagnosis present

## 2021-05-04 DIAGNOSIS — R918 Other nonspecific abnormal finding of lung field: Secondary | ICD-10-CM | POA: Diagnosis present

## 2021-05-04 DIAGNOSIS — R062 Wheezing: Secondary | ICD-10-CM | POA: Insufficient documentation

## 2021-05-04 DIAGNOSIS — J309 Allergic rhinitis, unspecified: Secondary | ICD-10-CM | POA: Insufficient documentation

## 2021-05-04 DIAGNOSIS — J40 Bronchitis, not specified as acute or chronic: Secondary | ICD-10-CM | POA: Diagnosis not present

## 2021-05-04 MED ORDER — FLUTICASONE PROPIONATE HFA 110 MCG/ACT IN AERO: 1.0000 | INHALATION_SPRAY | Freq: Two times a day (BID) | RESPIRATORY_TRACT | 12 refills | Status: DC

## 2021-05-04 MED ORDER — FLUTICASONE PROPIONATE 50 MCG/ACT NA SUSP: 1.0000 | Freq: Two times a day (BID) | NASAL | 5 refills | Status: DC

## 2021-05-04 MED ORDER — LORATADINE 10 MG PO TABS: 10.0000 mg | ORAL_TABLET | Freq: Every day | ORAL | 1 refills | Status: DC

## 2021-05-04 NOTE — Assessment & Plan Note (Signed)
Advised patient to start Flonase and an antihistamine daily along with Flovent as prescribed.  Lets take there is any improvement in his symptoms at this point.  Pending chest x-ray to determine further course of treatment.

## 2021-05-04 NOTE — Assessment & Plan Note (Signed)
Sent in prescription for Claritin and Flonase for patient to start using daily as this may help with symptoms and fullness in the ears

## 2021-05-04 NOTE — Progress Notes (Signed)
Established Patient Office Visit  Subjective:  [name], female    DOB: 1970-04-21  Age: 51 y.o. MRN: [id number]  CC: No chief complaint on file.   HPI Patricia Bauer is here today with concerns and f/u.  Was seen via video visit 04/23/21 with two week h/o sore throat, nasal congestion and cough. Covid was negative. Was started on augmentin one pill po bid x 10 days, on last day today with no improvement.   Today states mostly in the chest, worsening cough and noticing blood when she coughs. She does have a bloody dry nose that she has noted, but nothing significant. No fever or chills. Nasal congestion only at night, ears muffled but no pain.   Cough drops, theraflu, promethazine DM, no real help with symptoms.   Did not try steroids as pt states makes her feel 'roid rage' in the past. However will try if necessary.   Past Medical History:  Diagnosis Date   Anemia    Anxiety    Colon polyp    Depression    GERD (gastroesophageal reflux disease)    Headache    migraines    History of hysterectomy    still has both ovaries and cervix   History of migraine headaches    History of ovarian cyst    PONV (postoperative nausea and vomiting)     Past Surgical History:  Procedure Laterality Date   COLONOSCOPY     KNEE ARTHROPLASTY     KNEE ARTHROSCOPY     knee athroscopy  08/2010   LT   LAPAROSCOPIC SUPRACERVICAL HYSTERECTOMY     LAPAROSCOPIC VAGINAL HYSTERECTOMY  08/2009   Supra cervical   left partial knee replacement     PARTIAL KNEE ARTHROPLASTY Left 02/10/2014   Procedure: LEFT MEDIAL UNICOMPARTMENTAL KNEE ARTHROPLASTY;  Surgeon: Gearlean Alf, MD;  Location: [facility];  Service: Orthopedics;  Laterality: Left;   UPPER GI ENDOSCOPY     WISDOM TOOTH EXTRACTION  1995    Family History  Problem Relation Age of Onset   Stroke Father    Heart attack Father    Hyperlipidemia Mother    Hypertension Mother    Breast cancer Maternal Aunt 75   Colon  cancer Neg Hx    Colon polyps Neg Hx    Kidney disease Neg Hx    Gallbladder disease Neg Hx    Esophageal cancer Neg Hx    Alcohol abuse Neg Hx    Drug abuse Neg Hx    Depression Neg Hx    Bipolar disorder Neg Hx    Anxiety disorder Neg Hx    Schizophrenia Neg Hx    Suicidality Neg Hx     Social History   Socioeconomic History   Marital status: Single    Spouse name: Not on file   Number of children: 1   Years of education: Not on file   Highest education level: Not on file  Occupational History   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[employer]  Tobacco Use   Smoking status: Never   Smokeless tobacco: Never  Vaping Use   Vaping Use: Never used  Substance and Sexual Activity   Alcohol use: Yes    Alcohol/week: 0.0 standard drinks    Comment: occassional- once a month has about 2-3 drinks   Drug use: No   Sexual activity: Never  Other Topics Concern   Not on file  Social History Narrative   Not on file   Social Determinants  of Health   Financial Resource Strain: Not on file  Food Insecurity: Not on file  Transportation Needs: Not on file  Physical Activity: Not on file  Stress: Not on file  Social Connections: Not on file  Intimate Partner Violence: Not on file    Outpatient Medications Prior to Visit  Medication Sig Dispense Refill   ALPRAZolam (XANAX) 0.25 MG tablet TAKE 1 TABLET(0.25 MG) BY MOUTH DAILY AS NEEDED FOR ANXIETY OR SLEEP 30 tablet 0   folic acid (FOLVITE) 1 MG tablet Take 1 mg by mouth daily.     gabapentin (NEURONTIN) 300 MG capsule Take by mouth.     SUMAtriptan (IMITREX) 50 MG tablet TAKE 1 TABLET MAY REPEAT IN 2 HOURS IF HEADACHE PERSISTS OR RECURS. DON'T USE MORE THAN TWO IN 24HRS 90 tablet 2   traMADol (ULTRAM) 50 MG tablet Take 1 tablet (50 mg total) by mouth every 6 (six) hours as needed. 15 tablet 0   venlafaxine XR (EFFEXOR XR) 150 MG 24 hr capsule Take 1 capsule (150 mg total) by mouth daily with breakfast. 30 capsule 5   No  facility-administered medications prior to visit.    Allergies  Allergen Reactions   Ibuprofen Swelling    Bilateral swelling of ankles and feet   Diflucan [Fluconazole] Other (See Comments)    Pelvic pain   Onion Other (See Comments)    migraines    Topamax [Topiramate] Other (See Comments)    Reaction:  Body aches     Omeprazole    Hydrocodone Anxiety, Palpitations and Other (See Comments)    Reaction:  Migraines     ROS Review of Systems  Constitutional:  Negative for chills and fever.  HENT:  Positive for congestion (at night mainly) and sore throat. Negative for ear pain and sinus pressure.   Respiratory:  Positive for cough (hacking productive cough, bloody sputum at times) and wheezing. Negative for shortness of breath (doe).   Cardiovascular:  Negative for chest pain and palpitations.     Objective:    Physical Exam Constitutional:      General: She is not in acute distress.    Appearance: Normal appearance. She is obese. She is not ill-appearing, toxic-appearing or diaphoretic.  HENT:     Head: Normocephalic.     Right Ear: Hearing normal. No middle ear effusion. Tympanic membrane is bulging. Tympanic membrane is not erythematous.     Left Ear: Hearing normal.  No middle ear effusion. Tympanic membrane is bulging. Tympanic membrane is not erythematous.     Nose: Nose normal.     Mouth/Throat:     Mouth: Mucous membranes are moist.  Eyes:     Extraocular Movements: Extraocular movements intact.     Conjunctiva/sclera: Conjunctivae normal.     Pupils: Pupils are equal, round, and reactive to light.  Cardiovascular:     Rate and Rhythm: Normal rate and regular rhythm.  Pulmonary:     Effort: Pulmonary effort is normal.     Breath sounds: Normal breath sounds.  Neurological:     Mental Status: She is alert.    BP (!) 156/102    Pulse 87    Temp 97.6 F (36.4 C) (Temporal)    Ht 5' 4.5" (1.638 m)    Wt 236 lb (107 kg)    LMP 09/07/2009    SpO2 97%    BMI  39.88 kg/m  Wt Readings from Last 3 Encounters:  05/04/21 236 lb (107 kg)  04/23/21 235 lb (  106.6 kg)  02/23/21 234 lb 3.2 oz (106.2 kg)     Health Maintenance Due  Topic Date Due   Pneumococcal Vaccine 42-2 Years old (1 - PCV) Never done   Hepatitis C Screening  Never done   TETANUS/TDAP  Never done   COVID-19 Vaccine (3 - Pfizer risk series) 09/09/2019    There are no preventive care reminders to display for this patient.  Lab Results  Component Value Date   TSH 2.240 01/21/2021   Lab Results  Component Value Date   WBC 10.3 12/02/2020   HGB 13.1 12/02/2020   HCT 38.4 12/02/2020   MCV 96.7 12/02/2020   PLT 293 12/02/2020   Lab Results  Component Value Date   NA 137 01/21/2021   K 4.6 01/21/2021   CO2 22 01/21/2021   GLUCOSE 88 01/21/2021   BUN 15 01/21/2021   CREATININE 0.81 01/21/2021   BILITOT 0.2 01/21/2021   ALKPHOS 97 01/21/2021   AST 40 01/21/2021   ALT 62 (H) 01/21/2021   PROT 7.1 01/21/2021   ALBUMIN 4.6 01/21/2021   CALCIUM 9.8 01/21/2021   ANIONGAP 8 05/04/2020   EGFR 89 01/21/2021   Lab Results  Component Value Date   HGBA1C 6.0 (H) 01/21/2021      Assessment & Plan:   Problem List Items Addressed This Visit       Respiratory   Bronchitis    Advised patient to start Flonase and an antihistamine daily along with Flovent as prescribed.  Lets take there is any improvement in his symptoms at this point.  Pending chest x-ray to determine further course of treatment.       Allergic rhinitis    Sent in prescription for Claritin and Flonase for patient to start using daily as this may help with symptoms and fullness in the ears      Relevant Medications   fluticasone (FLONASE) 50 MCG/ACT nasal spray     Other   Wheezing - Primary    Chest x-ray today pending results depending on this we will determine which course of action going forward.  I have started patient on a Flovent inhaler that she will start taking daily.  Also advised patient  to start with Flonase and an antihistamine on a regular basis as this may also help with symptoms.  If there is pneumonia on the chest x-ray I will start patient on doxycycline with a medrol dose pack.  If there is no pneumonia I will likely consider levofloxacin as there is also some ear involvement.      Relevant Medications   fluticasone (FLOVENT HFA) 110 MCG/ACT inhaler   loratadine (CLARITIN) 10 MG tablet   Other Relevant Orders   DG Chest 2 View (Completed)   CT Chest Wo Contrast   Other Visit Diagnoses     Abnormality of lung on CXR       Relevant Orders   CT Chest Wo Contrast   Chronic cough       Relevant Orders   CT Chest Wo Contrast       Meds ordered this encounter  Medications   fluticasone (FLOVENT HFA) 110 MCG/ACT inhaler    Sig: Inhale 1 puff into the lungs in the morning and at bedtime.    Dispense:  1 each    Refill:  12    Order Specific Question:   Supervising Provider    Answer:   BEDSOLE, AMY E [[id number]]   loratadine (CLARITIN) 10 MG tablet  Sig: Take 1 tablet (10 mg total) by mouth daily.    Dispense:  30 tablet    Refill:  1    Order Specific Question:   Supervising Provider    Answer:   BEDSOLE, AMY E [[id number]]   fluticasone (FLONASE) 50 MCG/ACT nasal spray    Sig: Place 1 spray into both nostrils 2 (two) times daily.    Dispense:  16 g    Refill:  5    Order Specific Question:   Supervising Provider    Answer:   BEDSOLE, AMY E [[id number]]    Follow-up: Return if symptoms worsen or fail to improve.    Eugenia Pancoast, FNP

## 2021-05-04 NOTE — Assessment & Plan Note (Addendum)
Chest x-ray today pending results depending on this we will determine which course of action going forward.  I have started patient on a Flovent inhaler that she will start taking daily.  Also advised patient to start with Flonase and an antihistamine on a regular basis as this may also help with symptoms.  If there is pneumonia on the chest x-ray I will start patient on doxycycline with a medrol dose pack.  If there is no pneumonia I will likely consider levofloxacin as there is also some ear involvement.

## 2021-05-04 NOTE — Patient Instructions (Signed)
As this cough has been pretty persistent we are ordering a chest xray today.  Complete xray(s) prior to leaving today. I will notify you of your results once received.   I have sent an inhaler to your pharmacy, please start and take as directed.   I would also like you to start taking daily claritin and flonase as well.   Depending gon the results of the CXR will determine if and which antibiotic I will send, but I should receive today and will let you know ASAP.   It was a pleasure seeing you today! Please do not hesitate to reach out with any questions and or concerns.  Regards,   Eugenia Pancoast FNP-C

## 2021-05-04 NOTE — Telephone Encounter (Signed)
Responsed to pt via imaging results / cxr

## 2021-05-05 ENCOUNTER — Encounter: Payer: 59 | Admitting: Podiatry

## 2021-05-10 ENCOUNTER — Other Ambulatory Visit: Payer: Self-pay | Admitting: Family

## 2021-05-10 DIAGNOSIS — R062 Wheezing: Secondary | ICD-10-CM

## 2021-05-10 DIAGNOSIS — R052 Subacute cough: Secondary | ICD-10-CM

## 2021-05-11 ENCOUNTER — Encounter: Payer: Self-pay | Admitting: Family

## 2021-05-11 DIAGNOSIS — J0110 Acute frontal sinusitis, unspecified: Secondary | ICD-10-CM

## 2021-05-11 MED ORDER — LEVOFLOXACIN 500 MG PO TABS: 500.0000 mg | ORAL_TABLET | Freq: Every day | ORAL | 0 refills | Status: AC

## 2021-05-13 ENCOUNTER — Encounter: Payer: Self-pay | Admitting: Family

## 2021-05-14 ENCOUNTER — Ambulatory Visit (INDEPENDENT_AMBULATORY_CARE_PROVIDER_SITE_OTHER): Payer: 59 | Admitting: Internal Medicine

## 2021-05-14 ENCOUNTER — Other Ambulatory Visit: Payer: Self-pay

## 2021-05-14 ENCOUNTER — Other Ambulatory Visit
Admission: RE | Admit: 2021-05-14 | Discharge: 2021-05-14 | Disposition: A | Discharge status: Home / Self Care | Payer: 59 | Attending: Internal Medicine | Admitting: Internal Medicine

## 2021-05-14 ENCOUNTER — Encounter: Payer: Self-pay | Admitting: Internal Medicine

## 2021-05-14 ENCOUNTER — Telehealth: Payer: Self-pay | Admitting: Adult Health

## 2021-05-14 DIAGNOSIS — F422 Mixed obsessional thoughts and acts: Secondary | ICD-10-CM

## 2021-05-14 DIAGNOSIS — R058 Other specified cough: Secondary | ICD-10-CM | POA: Insufficient documentation

## 2021-05-14 DIAGNOSIS — F4000 Agoraphobia, unspecified: Secondary | ICD-10-CM

## 2021-05-14 DIAGNOSIS — F411 Generalized anxiety disorder: Secondary | ICD-10-CM

## 2021-05-14 DIAGNOSIS — F410 Panic disorder [episodic paroxysmal anxiety] without agoraphobia: Secondary | ICD-10-CM

## 2021-05-14 DIAGNOSIS — F331 Major depressive disorder, recurrent, moderate: Secondary | ICD-10-CM

## 2021-05-14 LAB — CBC WITH DIFFERENTIAL/PLATELET
Abs Immature Granulocytes: 0.03 10*3/uL (ref 0.00–0.07)
Basophils Absolute: 0 10*3/uL (ref 0.0–0.1)
Basophils Relative: 0 %
Eosinophils Absolute: 0.2 10*3/uL (ref 0.0–0.5)
Eosinophils Relative: 3 %
HCT: 43.3 % (ref 36.0–46.0)
Hemoglobin: 14.2 g/dL (ref 12.0–15.0)
Immature Granulocytes: 0 %
Lymphocytes Relative: 38 %
Lymphs Abs: 3.1 10*3/uL (ref 0.7–4.0)
MCH: 30.9 pg (ref 26.0–34.0)
MCHC: 32.8 g/dL (ref 30.0–36.0)
MCV: 94.3 fL (ref 80.0–100.0)
Monocytes Absolute: 0.4 10*3/uL (ref 0.1–1.0)
Monocytes Relative: 6 %
Neutro Abs: 4.3 10*3/uL (ref 1.7–7.7)
Neutrophils Relative %: 53 %
Platelets: 302 10*3/uL (ref 150–400)
RBC: 4.59 MIL/uL (ref 3.87–5.11)
RDW: 12.1 % (ref 11.5–15.5)
WBC: 8.1 10*3/uL (ref 4.0–10.5)
nRBC: 0 % (ref 0.0–0.2)

## 2021-05-14 MED ORDER — GABAPENTIN 300 MG PO CAPS: 300.0000 mg | ORAL_CAPSULE | Freq: Two times a day (BID) | ORAL | 2 refills | Status: DC

## 2021-05-14 MED ORDER — FAMOTIDINE 20 MG PO TABS: ORAL_TABLET | ORAL | 11 refills | Status: DC

## 2021-05-14 MED ORDER — METHYLPREDNISOLONE ACETATE 80 MG/ML IJ SUSP
80.0000 mg | Freq: Once | INTRAMUSCULAR | Status: AC
  Administered 2021-05-14: 80 mg via INTRAMUSCULAR

## 2021-05-14 MED ORDER — PANTOPRAZOLE SODIUM 40 MG PO TBEC
40.0000 mg | DELAYED_RELEASE_TABLET | Freq: Every day | ORAL | 2 refills | Status: DC
Start: 2021-05-14 — End: 2021-09-03

## 2021-05-14 MED ORDER — VENLAFAXINE HCL ER 150 MG PO CP24: 150.0000 mg | ORAL_CAPSULE | Freq: Every day | ORAL | 1 refills | Status: DC

## 2021-05-14 MED ORDER — TRAMADOL HCL 50 MG PO TABS: 50.0000 mg | ORAL_TABLET | ORAL | 0 refills | Status: AC | PRN

## 2021-05-14 NOTE — Telephone Encounter (Signed)
Pt has an apt Friday 2/3. She needs a refill on her effexor xr 150 mg. Pharmacy is [facility] on [address] [place]

## 2021-05-14 NOTE — Patient Instructions (Signed)
The key to effective treatment for your cough is eliminating the non-stop cycle of cough you're stuck in long enough to let your airway heal completely and then see if there is anything still making you cough once you stop the cough suppression, but this should take no more than 5 days to figure out  First take Gabapentin 300 mg  4 x daily and supplement if needed with  tramadol 50 mg up to 2 every 4 hours to suppress the urge to cough at all or even clear your throat. Swallowing water or using ice chips/non mint and menthol containing candies (such as lifesavers or sugarless jolly ranchers) are also effective.  You should rest your voice and avoid activities that you know make you cough.  Once you have eliminated the cough for 3 straight days try reducing the tramadol first,  then the delsym as tolerated.       Protonix (pantoprazole) Take 30-60 min before first meal of the day and Pepcid 20 mg one bedtime plus chlorpheniramine 4 mg x 2 at bedtime (both available over the counter)  until cough is completely gone for at least a week without the need for cough suppression  GERD (REFLUX)  is an extremely common cause of respiratory symptoms, many times with no significant heartburn at all.    It can be treated with medication, but also with lifestyle changes including avoidance of late meals, excessive alcohol, smoking cessation, and avoid fatty foods, chocolate, peppermint, colas, red wine, and acidic juices such as orange juice.  NO MINT OR MENTHOL PRODUCTS SO NO COUGH DROPS  USE HARD CANDY INSTEAD (jolley ranchers or Stover's or Lifesavers (all available in sugarless versions) NO OIL BASED VITAMINS - use powdered substitutes.  Please remember to go to the lab department   for your tests - we will call you with the results when they are available.      Call if not 100% better in 2 weeks

## 2021-05-14 NOTE — Progress Notes (Signed)
Patricia Bauer, female    DOB: 1970-11-23,   MRN: [id number]   Brief patient profile:  33  yowf  never smoker/ never any resp problems referred to pulmonary clinic in [facility]  05/14/2021 by Georgina Peer  for cough and wheeze in setting of viral URI  just prior to xmas 2022 and much by [place] with multiple sick contacts including childred in the home.     History of Present Illness  05/14/2021  Pulmonary/ 1st office eval/ Patricia Bauer / [employer]  Chief Complaint  Patient presents with   Consult  Dyspnea:  even if not cough feels more doe - can't do grocery shopping  Cough: worse before supper and worse as evening goes on, some better at hs but that starts back up during the night Rx augmentin then levaquin  Sleep: bed is flat, usual pillows  SABA use: none / cough is no better with flovent   No obvious day to day or daytime variability or assoc excess/ purulent sputum or mucus plugs or hemoptysis or cp or chest tightness, subjective wheeze or overt sinus or hb symptoms.    Also denies any obvious fluctuation of symptoms with weather or environmental changes or other aggravating or alleviating factors except as outlined above   No unusual exposure hx or h/o childhood pna/ asthma or knowledge of premature birth.  Current Allergies, Complete Past Medical History, Past Surgical History, Family History, and Social History were reviewed in [employer] record.  ROS  The following are not active complaints unless bolded Hoarseness, sore throat/globus, dysphagia, dental problems, itching, sneezing,  nasal congestion or discharge of excess mucus or purulent secretions, ear ache,   fever, chills, sweats, unintended wt loss or wt gain, classically pleuritic or exertional cp,  orthopnea pnd or arm/hand swelling  or leg swelling, presyncope, palpitations, abdominal pain, anorexia, nausea, vomiting, diarrhea  or change in bowel habits or change in bladder habits, change in  stools or change in urine, dysuria, hematuria,  rash, arthralgias, visual complaints, headache, numbness, weakness or ataxia or problems with walking or coordination,  change in mood or  memory.            Past Medical History:  Diagnosis Date   Anemia    Anxiety    Colon polyp    Depression    GERD (gastroesophageal reflux disease)    Headache    migraines    History of hysterectomy    still has both ovaries and cervix   History of migraine headaches    History of ovarian cyst    PONV (postoperative nausea and vomiting)     Outpatient Medications Prior to Visit  Medication Sig Dispense Refill   ALPRAZolam (XANAX) 0.25 MG tablet TAKE 1 TABLET(0.25 MG) BY MOUTH DAILY AS NEEDED FOR ANXIETY OR SLEEP 30 tablet 0   fluticasone (FLONASE) 50 MCG/ACT nasal spray Place 1 spray into both nostrils 2 (two) times daily. 16 g 5   fluticasone (FLOVENT HFA) 110 MCG/ACT inhaler Inhale 1 puff into the lungs in the morning and at bedtime. 1 each 12   folic acid (FOLVITE) 1 MG tablet Take 1 mg by mouth daily.     gabapentin (NEURONTIN) 300 MG capsule Take by mouth.     levofloxacin (LEVAQUIN) 500 MG tablet Take 1 tablet (500 mg total) by mouth daily for 7 days. 7 tablet 0   loratadine (CLARITIN) 10 MG tablet Take 1 tablet (10 mg total) by mouth daily. 30 tablet  1   SUMAtriptan (IMITREX) 50 MG tablet TAKE 1 TABLET MAY REPEAT IN 2 HOURS IF HEADACHE PERSISTS OR RECURS. DON'T USE MORE THAN TWO IN 24HRS 90 tablet 2   traMADol (ULTRAM) 50 MG tablet Take 1 tablet (50 mg total) by mouth every 6 (six) hours as needed. 15 tablet 0   venlafaxine XR (EFFEXOR XR) 150 MG 24 hr capsule Take 1 capsule (150 mg total) by mouth daily with breakfast. 30 capsule 5   No facility-administered medications prior to visit.     Objective:     BP 136/88 (BP Location: Left Arm, Patient Position: Sitting, Cuff Size: Normal)    Pulse 96    Temp (!) 96.8 F (36 C)    Ht 5' 4.5" (1.638 m)    Wt 233 lb 9.6 oz (106 kg)    LMP  09/07/2009    SpO2 99%    BMI 39.48 kg/m   SpO2: 99 % amb obese wf with harsh honking cough dry cough    HEENT : pt wearing mask not removed for exam due to covid -19 concerns.    NECK :  without JVD/Nodes/TM/ nl carotid upstrokes bilaterally   LUNGS: no acc muscle use,  Nl contour chest which is clear to A and P bilaterally without cough on insp or exp maneuvers   CV:  RRR  no s3 or murmur or increase in P2, and no edema   ABD:  soft and nontender with nl inspiratory excursion in the supine position. No bruits or organomegaly appreciated, bowel sounds nl  MS:  Nl gait/ ext warm without deformities, calf tenderness, cyanosis or clubbing No obvious joint restrictions   SKIN: warm and dry without lesions    NEURO:  alert, approp, nl sensorium with  no motor or cerebellar deficits apparent.    I personally reviewed images and agree with radiology impression as follows:   Chest CT w/o contrast 05/04/21 1. No acute cardiopulmonary disease.    Assessment   Upper airway cough syndrome Onset with URI before xmas of 2022 / severe to point of gag - Pertussis AB 05/14/2021 > pending  - Allergy profile 05/14/2021 >  Eos 0.0 /  IgE  Pending  - cyclical cough rx with tramadol and increase gabapentin from 300 bid to qid   No response to rx for asthma so advised to d/c inhalers.  Of the three most common causes of  Sub-acute / recurrent or chronic cough, only one (GERD)  can actually contribute to/ trigger  the other two (asthma and post nasal drip syndrome)  and perpetuate the cylce of cough.  While not intuitively obvious, many patients with chronic low grade reflux do not cough until there is a primary insult that disturbs the protective epithelial barrier and exposes sensitive nerve endings.   This is typically viral but can due to PNDS and  either may apply here.    >>>  The point is that once this occurs, it is difficult to eliminate the cycle  using anything but a maximally effective  acid suppression regimen at least in the short run, accompanied by an appropriate diet to address non acid GERD and control / eliminate the cough itself for at least 3 days with tramadol and increase her gabapenitin to 300 mg qid until no longer needing any tramadol at all then reduce the gabapentin back to 300mg  bid which is the dose she says she's been taking as maintenance for chronic pain.  Also added 1st gen  H1 blockers per guidelines  For any pnds related cough to replace clariton in the short run, and >>> also so added 6 day taper off  Prednisone starting at 40 mg per day in case of component of Th-2 driven upper or lower airways inflammation (if cough responds short term only to relapse before return while will on full rx for uacs (as above), then  that would point to allergic rhinitis/ asthma or eos bronchitis as alternative dx)   F/u in 2 weeks if not 100% better          Each maintenance medication was reviewed in detail including emphasizing most importantly the difference between maintenance and prns and under what circumstances the prns are to be triggered using an action plan format where appropriate.  Total time for H and P, chart review, counseling,   and generating customized AVS unique to this initial  office visit / same day charting > 45 min            Christinia Gully, MD 05/15/2021

## 2021-05-14 NOTE — Telephone Encounter (Signed)
Rx sent 

## 2021-05-15 ENCOUNTER — Encounter: Payer: Self-pay | Admitting: Internal Medicine

## 2021-05-15 NOTE — Assessment & Plan Note (Addendum)
Onset with URI before xmas of 2022 / severe to point of gag - Pertussis AB 05/14/2021 > pending  - Allergy profile 05/14/2021 >  Eos 0.0 /  IgE  Pending  - cyclical cough rx with tramadol and increase gabapentin from 300 bid to qid   No response to rx for asthma so advised to d/c inhalers.  Of the three most common causes of  Sub-acute / recurrent or chronic cough, only one (GERD)  can actually contribute to/ trigger  the other two (asthma and post nasal drip syndrome)  and perpetuate the cylce of cough.  While not intuitively obvious, many patients with chronic low grade reflux do not cough until there is a primary insult that disturbs the protective epithelial barrier and exposes sensitive nerve endings.   This is typically viral but can due to PNDS and  either may apply here.    >>>  The point is that once this occurs, it is difficult to eliminate the cycle  using anything but a maximally effective acid suppression regimen at least in the short run, accompanied by an appropriate diet to address non acid GERD and control / eliminate the cough itself for at least 3 days with tramadol and increase her gabapenitin to 300 mg qid until no longer needing any tramadol at all then reduce the gabapentin back to 300mg  bid which is the dose she says she's been taking as maintenance for chronic pain.  Also added 1st gen H1 blockers per guidelines  For any pnds related cough to replace clariton in the short run, and >>> also so added 6 day taper off  Prednisone starting at 40 mg per day in case of component of Th-2 driven upper or lower airways inflammation (if cough responds short term only to relapse before return while will on full rx for uacs (as above), then  that would point to allergic rhinitis/ asthma or eos bronchitis as alternative dx)   F/u in 2 weeks if not 100% better          Each maintenance medication was reviewed in detail including emphasizing most importantly the difference between  maintenance and prns and under what circumstances the prns are to be triggered using an action plan format where appropriate.  Total time for H and P, chart review, counseling,   and generating customized AVS unique to this initial  office visit / same day charting > 45 min

## 2021-05-17 ENCOUNTER — Telehealth: Payer: Self-pay

## 2021-05-17 NOTE — Telephone Encounter (Signed)
-----   Message from Tanda Rockers, MD sent at 05/15/2021  6:59 AM EST ----- I forgot to cross the flovent off her list but I told her to stop it - please call her and be sure she does so

## 2021-05-17 NOTE — Telephone Encounter (Signed)
Spoke to patient.  She stated that she did stop flovent as instructed.   Will route to Dr. Melvyn Novas as an Juluis Rainier.

## 2021-05-18 ENCOUNTER — Encounter: Payer: Self-pay | Admitting: Internal Medicine

## 2021-05-18 LAB — MISC LABCORP TEST (SEND OUT): Labcorp test code: 163030

## 2021-05-18 NOTE — Telephone Encounter (Signed)
Dr. Melvyn Novas, patient is requesting lab results.   Please advise. Thanks

## 2021-05-19 LAB — IGE: IgE (Immunoglobulin E), Serum: 36 IU/mL (ref 6–495)

## 2021-05-21 ENCOUNTER — Ambulatory Visit: Payer: 59 | Admitting: Adult Health

## 2021-05-24 ENCOUNTER — Ambulatory Visit: Payer: 59 | Admitting: Family Medicine

## 2021-05-26 ENCOUNTER — Encounter: Payer: 59 | Admitting: Podiatry

## 2021-05-31 ENCOUNTER — Ambulatory Visit: Payer: 59 | Admitting: Family Medicine

## 2021-06-04 ENCOUNTER — Ambulatory Visit: Payer: 59 | Admitting: Adult Health

## 2021-06-04 ENCOUNTER — Ambulatory Visit: Payer: 59 | Admitting: Family

## 2021-06-04 ENCOUNTER — Other Ambulatory Visit: Payer: 59

## 2021-06-09 ENCOUNTER — Encounter: Payer: 59 | Admitting: Podiatry

## 2021-06-11 ENCOUNTER — Other Ambulatory Visit: Payer: Self-pay

## 2021-06-11 ENCOUNTER — Encounter: Payer: Self-pay | Admitting: Family

## 2021-06-11 ENCOUNTER — Inpatient Hospital Stay: Payer: 59 | Attending: Family

## 2021-06-11 ENCOUNTER — Inpatient Hospital Stay: Payer: 59 | Admitting: Family

## 2021-06-11 VITALS — BP 149/105 | HR 91 | Temp 98.7°F | Resp 18 | Wt 239.8 lb

## 2021-06-11 DIAGNOSIS — D508 Other iron deficiency anemias: Secondary | ICD-10-CM

## 2021-06-11 DIAGNOSIS — K909 Intestinal malabsorption, unspecified: Secondary | ICD-10-CM | POA: Diagnosis present

## 2021-06-11 LAB — CBC WITH DIFFERENTIAL (CANCER CENTER ONLY)
Abs Immature Granulocytes: 0.05 10*3/uL (ref 0.00–0.07)
Basophils Absolute: 0.1 10*3/uL (ref 0.0–0.1)
Basophils Relative: 1 %
Eosinophils Absolute: 0.1 10*3/uL (ref 0.0–0.5)
Eosinophils Relative: 1 %
HCT: 43.4 % (ref 36.0–46.0)
Hemoglobin: 14.5 g/dL (ref 12.0–15.0)
Immature Granulocytes: 1 %
Lymphocytes Relative: 22 %
Lymphs Abs: 2.4 10*3/uL (ref 0.7–4.0)
MCH: 31.6 pg (ref 26.0–34.0)
MCHC: 33.4 g/dL (ref 30.0–36.0)
MCV: 94.6 fL (ref 80.0–100.0)
Monocytes Absolute: 0.6 10*3/uL (ref 0.1–1.0)
Monocytes Relative: 5 %
Neutro Abs: 7.8 10*3/uL — ABNORMAL HIGH (ref 1.7–7.7)
Neutrophils Relative %: 70 %
Platelet Count: 289 10*3/uL (ref 150–400)
RBC: 4.59 MIL/uL (ref 3.87–5.11)
RDW: 12.1 % (ref 11.5–15.5)
WBC Count: 11 10*3/uL — ABNORMAL HIGH (ref 4.0–10.5)
nRBC: 0 % (ref 0.0–0.2)

## 2021-06-11 LAB — IRON AND IRON BINDING CAPACITY (CC-WL,HP ONLY)
Iron: 90 ug/dL (ref 28–170)
Saturation Ratios: 24 % (ref 10.4–31.8)
TIBC: 372 ug/dL (ref 250–450)
UIBC: 282 ug/dL (ref 148–442)

## 2021-06-11 LAB — RETICULOCYTES
Immature Retic Fract: 15.1 % (ref 2.3–15.9)
RBC.: 4.55 MIL/uL (ref 3.87–5.11)
Retic Count, Absolute: 112.8 10*3/uL (ref 19.0–186.0)
Retic Ct Pct: 2.5 % (ref 0.4–3.1)

## 2021-06-11 NOTE — Progress Notes (Signed)
Hematology and Oncology Follow Up Visit  Patricia Bauer [id number] 07/11/1970 51 y.o. 06/11/2021   Principle Diagnosis:  Iron deficiency anemia secondary to malabsorption   Current Therapy:        IV iron as indicated   Interim History:  Patricia Bauer is here today for follow-up. She is symptomatic with fatigue.  She has not noted any blood loss. No bruising or petechiae.  She has occasional nausea with GERD. Since having her gallbladder removed, this has been better.  She has occasional SOB with over exertion as well as dizziness and palpitations.  No fever, chills, n/v, cough, rash, chest pain, abdominal pain or changes in bowel or bladder habits.  No swelling, tenderness, numbness or tingling in her extremities.  No falls or syncope.  She has maintained a good appetite and is staying well hydrated. Her weight is 239 lbs.   ECOG Performance Status: 1 - Symptomatic but completely ambulatory  Medications:  Allergies as of 06/11/2021       Reactions   Ibuprofen Swelling   Bilateral swelling of ankles and feet   Diflucan [fluconazole] Other (See Comments)   Pelvic pain   Onion Other (See Comments)   migraines    Topamax [topiramate] Other (See Comments)   Reaction:  Body aches    Omeprazole    Hydrocodone Anxiety, Palpitations, Other (See Comments)   Reaction:  Migraines         Medication List        Accurate as of June 11, 2021  2:03 PM. If you have any questions, ask your nurse or doctor.          ALPRAZolam 0.25 MG tablet Commonly known as: XANAX TAKE 1 TABLET(0.25 MG) BY MOUTH DAILY AS NEEDED FOR ANXIETY OR SLEEP   famotidine 20 MG tablet Commonly known as: Pepcid One after supper   fluticasone 50 MCG/ACT nasal spray Commonly known as: FLONASE Place 1 spray into both nostrils 2 (two) times daily.   folic acid 1 MG tablet Commonly known as: FOLVITE Take 1 mg by mouth daily.   gabapentin 300 MG capsule Commonly known as: NEURONTIN Take by  mouth.   gabapentin 300 MG capsule Commonly known as: Neurontin Take 1 capsule (300 mg total) by mouth 2 (two) times daily.   loratadine 10 MG tablet Commonly known as: CLARITIN Take 1 tablet (10 mg total) by mouth daily.   pantoprazole 40 MG tablet Commonly known as: Protonix Take 1 tablet (40 mg total) by mouth daily. Take 30-60 min before first meal of the day   SUMAtriptan 50 MG tablet Commonly known as: IMITREX TAKE 1 TABLET MAY REPEAT IN 2 HOURS IF HEADACHE PERSISTS OR RECURS. DON'T USE MORE THAN TWO IN 24HRS   traMADol 50 MG tablet Commonly known as: ULTRAM Take 1 tablet (50 mg total) by mouth every 6 (six) hours as needed.   venlafaxine XR 150 MG 24 hr capsule Commonly known as: Effexor XR Take 1 capsule (150 mg total) by mouth daily with breakfast.        Allergies:  Allergies  Allergen Reactions   Ibuprofen Swelling    Bilateral swelling of ankles and feet   Diflucan [Fluconazole] Other (See Comments)    Pelvic pain   Onion Other (See Comments)    migraines    Topamax [Topiramate] Other (See Comments)    Reaction:  Body aches     Omeprazole    Hydrocodone Anxiety, Palpitations and Other (See Comments)    Reaction:  Migraines     Past Medical History, Surgical history, Social history, and Family History were reviewed and updated.  Review of Systems: All other 10 point review of systems is negative.   Physical Exam:  weight is 239 lb 12.8 oz (108.8 kg). Her oral temperature is 98.7 F (37.1 C). Her blood pressure is 149/105 (abnormal) and her pulse is 91. Her respiration is 18 and oxygen saturation is 98%.   Wt Readings from Last 3 Encounters:  06/11/21 239 lb 12.8 oz (108.8 kg)  05/14/21 233 lb 9.6 oz (106 kg)  05/04/21 236 lb (107 kg)    Ocular: Sclerae unicteric, pupils equal, round and reactive to light Ear-nose-throat: Oropharynx clear, dentition fair Lymphatic: No cervical or supraclavicular adenopathy Lungs no rales or rhonchi, good  excursion bilaterally Heart regular rate and rhythm, no murmur appreciated Abd soft, nontender, positive bowel sounds MSK no focal spinal tenderness, no joint edema Neuro: non-focal, well-oriented, appropriate affect Breasts: Deferred   Lab Results  Component Value Date   WBC 11.0 (H) 06/11/2021   HGB 14.5 06/11/2021   HCT 43.4 06/11/2021   MCV 94.6 06/11/2021   PLT 289 06/11/2021   Lab Results  Component Value Date   FERRITIN 199 12/02/2020   IRON 88 12/02/2020   TIBC 320 12/02/2020   UIBC 232 12/02/2020   IRONPCTSAT 27 12/02/2020   Lab Results  Component Value Date   RETICCTPCT 2.5 06/11/2021   RBC 4.55 06/11/2021   No results found for: KPAFRELGTCHN, LAMBDASER, KAPLAMBRATIO No results found for: IGGSERUM, IGA, IGMSERUM No results found for: [name], SPEI   Chemistry      Component Value Date/Time   NA 137 01/21/2021 1448   NA 139 03/13/2013 2022   K 4.6 01/21/2021 1448   K 3.6 03/13/2013 2022   CL 98 01/21/2021 1448   CL 106 03/13/2013 2022   CO2 22 01/21/2021 1448   CO2 26 03/13/2013 2022   BUN 15 01/21/2021 1448   BUN 11 03/13/2013 2022   CREATININE 0.81 01/21/2021 1448   CREATININE 1.05 (H) 05/24/2019 1359   CREATININE 0.80 03/13/2013 2022   GLU 94 11/23/2017 0000      Component Value Date/Time   CALCIUM 9.8 01/21/2021 1448   CALCIUM 8.7 03/13/2013 2022   ALKPHOS 97 01/21/2021 1448   AST 40 01/21/2021 1448   AST 27 05/24/2019 1359   ALT 62 (H) 01/21/2021 1448   ALT 32 05/24/2019 1359   BILITOT 0.2 01/21/2021 1448   BILITOT 0.3 05/24/2019 1359       Impression and Plan: Patricia Bauer is a very pleasant 51 yo caucasian female with iron deficiency anemia secondary to malabsorption.  Iron studies are pending.  Follow-up in 1 year.   Lottie Dawson, NP 2/24/20232:03 PM

## 2021-06-12 ENCOUNTER — Encounter: Payer: Self-pay | Admitting: Family

## 2021-06-14 ENCOUNTER — Telehealth: Payer: Self-pay | Admitting: *Deleted

## 2021-06-14 LAB — FERRITIN: Ferritin: 310 ng/mL — ABNORMAL HIGH (ref 11–307)

## 2021-06-14 NOTE — Telephone Encounter (Signed)
Per 06/11/21 los - called and lvm of upcoming appointment - requested call back to confirm

## 2021-06-15 ENCOUNTER — Other Ambulatory Visit: Payer: Self-pay | Admitting: Internal Medicine

## 2021-06-15 ENCOUNTER — Encounter: Payer: Self-pay | Admitting: Internal Medicine

## 2021-06-15 MED ORDER — PREDNISONE 10 MG PO TABS: ORAL_TABLET | ORAL | 0 refills | Status: DC

## 2021-06-15 NOTE — Telephone Encounter (Signed)
Ok  to try Prednisone 10 mg take  4 each am x 2 days,   2 each am x 2 days,  1 each am x 2 days and stop which would cover any asthma/ allergy component s ov but any other rx would require re eval in office with all meds in hand 1st

## 2021-06-15 NOTE — Telephone Encounter (Signed)
Dr. Melvyn Novas, please advise.   Hello, My cough never completely went away, but did lessen considerably. I am now coughing as before. I am still taking the pantoprazole and famotidine along with 300mg  Gabapentin 4x a day. Do I need to come in again or do I need to do something different?   Thank you, Patricia Bauer

## 2021-06-16 ENCOUNTER — Other Ambulatory Visit: Payer: Self-pay

## 2021-06-16 ENCOUNTER — Encounter: Payer: Self-pay | Admitting: Podiatry

## 2021-06-16 ENCOUNTER — Ambulatory Visit: Payer: 59 | Admitting: Podiatry

## 2021-06-16 DIAGNOSIS — D2371 Other benign neoplasm of skin of right lower limb, including hip: Secondary | ICD-10-CM

## 2021-06-16 DIAGNOSIS — M2011 Hallux valgus (acquired), right foot: Secondary | ICD-10-CM

## 2021-06-16 DIAGNOSIS — D2372 Other benign neoplasm of skin of left lower limb, including hip: Secondary | ICD-10-CM

## 2021-06-16 DIAGNOSIS — M722 Plantar fascial fibromatosis: Secondary | ICD-10-CM | POA: Diagnosis not present

## 2021-06-16 NOTE — Progress Notes (Signed)
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  States that ready to get this right foot fixed.  States that she has suggests finished physical therapy for her right knee where she had a pretty bad meniscal tear that was repaired.  She states that she did like to go ahead and get this surgical procedure performed to this right foot. ? ?Objective: Vital signs are stable alert and oriented x3 have reviewed her past medical history medications allergies surgeries and social history.  She has a very hypermobile first metatarsal medial cuneiform joint with severe bunion deformity.  She has good range of motion at the first metatarsal phalangeal joint.  Radiographs were reviewed today increase intermetatarsal angle greater than 20 degrees.  Hallux abductus angle greater than 35 degrees.  Benign reactive skin lesions medial aspect of the hallux bilateral.  No pain on palpation medial calcaneal tubercles. ? ?Assessment: Severe bunion deformity with hypermobility of the first tarsometatarsal joint.  Painful benign skin lesions.  Resolving Planter fasciitis. ? ?Plan: At this point went ahead and debrided all reactive tissue and consented her for a Lapidus procedure discussed the pros and cons of the Lapidus procedure she understands this is amenable to it does understand the risks which may include but not limited to postop pain bleeding swelling infection recurrence need for further surgery overcorrection under correction also digit loss of life and pain to that right knee when she uses a knee scooter.  She understands this and is amenable to it.  I will follow-up with her in the near future for surgical intervention. ?

## 2021-06-22 ENCOUNTER — Encounter: Payer: Self-pay | Admitting: Family Medicine

## 2021-06-22 ENCOUNTER — Encounter: Payer: Self-pay | Admitting: Emergency Medicine

## 2021-06-22 ENCOUNTER — Emergency Department
Admission: EM | Admit: 2021-06-22 | Discharge: 2021-06-22 | Disposition: A | Discharge status: Home / Self Care | Payer: 59 | Attending: Emergency Medicine | Admitting: Emergency Medicine

## 2021-06-22 ENCOUNTER — Other Ambulatory Visit: Payer: Self-pay

## 2021-06-22 DIAGNOSIS — K529 Noninfective gastroenteritis and colitis, unspecified: Secondary | ICD-10-CM | POA: Diagnosis not present

## 2021-06-22 DIAGNOSIS — R112 Nausea with vomiting, unspecified: Secondary | ICD-10-CM | POA: Diagnosis present

## 2021-06-22 MED ORDER — ONDANSETRON 4 MG PO TBDP
4.0000 mg | ORAL_TABLET | Freq: Once | ORAL | Status: AC
  Administered 2021-06-22: 4 mg via ORAL
  Filled 2021-06-22: qty 1

## 2021-06-22 MED ORDER — ONDANSETRON 4 MG PO TBDP: 4.0000 mg | ORAL_TABLET | Freq: Three times a day (TID) | ORAL | 0 refills | Status: DC | PRN

## 2021-06-22 NOTE — ED Triage Notes (Signed)
Arrives via [facility]. C/O Nausea since Saturday and vomiting and diarrhea x 1 day.  VS wnl. ?

## 2021-06-22 NOTE — ED Notes (Signed)
See triage note  presents with some n/v/d which started 2 days ago  afebrile on arrival ?

## 2021-06-22 NOTE — ED Provider Notes (Signed)
? ?  [facility] ?Provider Note ? ? ? Event Date/Time  ? First MD Initiated Contact with Patient 06/22/21 0901   ?  (approximate) ? ? ?History  ? ?Emesis ? ? ?HPI ? ?Patricia Bauer is a 51 y.o. female with history of GERD, anxiety, anemia who presents with complaints of nausea vomiting diarrhea which started in earnest 1 day ago.  She denies significant abdominal pain, describes intermittent cramping sensation when she has to have a bowel movement.  No fevers reported.  No sick contacts reported. ?  ? ? ?Physical Exam  ? ?Triage Vital Signs: ?ED Triage Vitals  ?Enc Vitals Group  ?   BP 06/22/21 0859 (!) 127/93  ?   Pulse Rate 06/22/21 0859 94  ?   Resp 06/22/21 0859 17  ?   Temp 06/22/21 0859 98.1 ?F (36.7 ?C)  ?   Temp Source 06/22/21 0859 Oral  ?   SpO2 --   ?   Weight 06/22/21 0849 108.7 kg (239 lb 10.2 oz)  ?   Height 06/22/21 0849 1.638 m (5' 4.5")  ?   Head Circumference --   ?   Peak Flow --   ?   Pain Score 06/22/21 0849 0  ?   Pain Loc --   ?   Pain Edu? --   ?   Excl. in [place]? --   ? ? ?Most recent vital signs: ?Vitals:  ? 06/22/21 0859  ?BP: (!) 127/93  ?Pulse: 94  ?Resp: 17  ?Temp: 98.1 ?F (36.7 ?C)  ? ? ? ?General: Awake, no distress.  ?CV:  Good peripheral perfusion.  ?Resp:  Normal effort.  ?Abd:  No distention.  ?Other:   ? ? ?ED Results / Procedures / Treatments  ? ?Labs ?(all labs ordered are listed, but only abnormal results are displayed) ?Labs Reviewed - No data to display ? ? ?EKG ? ? ? ? ?RADIOLOGY ? ? ? ? ?PROCEDURES: ? ?Critical Care performed:  ? ?Procedures ? ? ?MEDICATIONS ORDERED IN ED: ?Medications  ?ondansetron (ZOFRAN-ODT) disintegrating tablet 4 mg (4 mg Oral Given 06/22/21 0926)  ? ? ? ?IMPRESSION / MDM / ASSESSMENT AND PLAN / ED COURSE  ?I reviewed the triage vital signs and the nursing notes. ? ?Patient presents with nausea vomiting diarrhea, consistent with viral gastroenteritis.  Norovirus is prevalent in the community at this time. ? ?Afebrile, reassuring  exam.  Will treat with ODT Zofran, no indication for lab work at this time ? ? ? ?  ? ? ?FINAL CLINICAL IMPRESSION(S) / ED DIAGNOSES  ? ?Final diagnoses:  ?Gastroenteritis  ? ? ? ?Rx / DC Orders  ? ?ED Discharge Orders   ? ?      Ordered  ?  ondansetron (ZOFRAN-ODT) 4 MG disintegrating tablet  Every 8 hours PRN       ? 06/22/21 6010  ? ?  ?  ? ?  ? ? ? ?Note:  This document was prepared using Dragon voice recognition software and may include unintentional dictation errors. ?  ?Lavonia Drafts, MD ?06/22/21 1018 ? ?

## 2021-07-01 ENCOUNTER — Ambulatory Visit: Payer: 59 | Admitting: Family Medicine

## 2021-07-01 ENCOUNTER — Other Ambulatory Visit: Payer: Self-pay

## 2021-07-01 VITALS — BP 140/100 | HR 75 | Temp 98.0°F | Ht 64.5 in | Wt 237.4 lb

## 2021-07-01 DIAGNOSIS — R0609 Other forms of dyspnea: Secondary | ICD-10-CM | POA: Insufficient documentation

## 2021-07-01 DIAGNOSIS — I10 Essential (primary) hypertension: Secondary | ICD-10-CM | POA: Diagnosis not present

## 2021-07-01 MED ORDER — LISINOPRIL 10 MG PO TABS: 10.0000 mg | ORAL_TABLET | Freq: Every day | ORAL | 3 refills | Status: DC

## 2021-07-01 NOTE — Progress Notes (Signed)
? ?Subjective:  ? ?  ?Patricia Bauer is a 51 y.o. female presenting for Hypertension (150's over 100's x 3-4 months ) ?  ? ? ?Hypertension ? ? ?#HTN ?- elevated numbers 140/100 ?- whoosing symptom in the head ?- lightheadedness ?- DOE - mild exertion ?- no swelling in the legs ?- no PND ?- sleeps on 3 pillows, previously 1-2 pillows - though is also being treated for reflux and chronic cough ?- recently had norovirus ?- knee surgery in November ?- iron deficiency anemia - which is under control per recent labs - no longer needing infusion ?- endorses some chest tightness at rest and with exertion ? ? ?Review of Systems ? ? ?Social History  ? ?Tobacco Use  ?Smoking Status Never  ?Smokeless Tobacco Never  ? ? ? ?   ?Objective:  ?  ?BP Readings from Last 3 Encounters:  ?07/01/21 (!) 140/100  ?06/22/21 (!) 127/93  ?06/11/21 (!) 149/105  ? ?Wt Readings from Last 3 Encounters:  ?07/01/21 237 lb 6 oz (107.7 kg)  ?06/22/21 239 lb 10.2 oz (108.7 kg)  ?06/11/21 239 lb 12.8 oz (108.8 kg)  ? ? ?BP (!) 140/100   Pulse 75   Temp 98 ?F (36.7 ?C) (Oral)   Ht 5' 4.5" (1.638 m)   Wt 237 lb 6 oz (107.7 kg)   LMP 09/07/2009   SpO2 99%   BMI 40.12 kg/m?  ? ? ?Physical Exam ?Constitutional:   ?   General: She is not in acute distress. ?   Appearance: She is well-developed. She is not diaphoretic.  ?HENT:  ?   Right Ear: External ear normal.  ?   Left Ear: External ear normal.  ?   Nose: Nose normal.  ?Eyes:  ?   Conjunctiva/sclera: Conjunctivae normal.  ?Neck:  ?   Vascular: No hepatojugular reflux or JVD.  ?   Comments: Unable to visualize any JVD - but patient notes abnormal sensation in the neck/head with hepatojugular reflux testing ?Cardiovascular:  ?   Rate and Rhythm: Normal rate and regular rhythm.  ?   Heart sounds: No murmur heard. ?Pulmonary:  ?   Effort: Pulmonary effort is normal. No respiratory distress.  ?   Breath sounds: Normal breath sounds. No wheezing or rales.  ?Musculoskeletal:  ?   Cervical back: Neck  supple.  ?Skin: ?   General: Skin is warm and dry.  ?   Capillary Refill: Capillary refill takes less than 2 seconds.  ?Neurological:  ?   Mental Status: She is alert. Mental status is at baseline.  ?Psychiatric:     ?   Mood and Affect: Mood normal.     ?   Behavior: Behavior normal.  ? ? ?EKG: NSR, no ST changes, no T-wave abnormalities ? ? ?   ?Assessment & Plan:  ? ?Problem List Items Addressed This Visit   ? ?  ? Cardiovascular and Mediastinum  ? Hypertension - Primary  ?  Poorly controlled and symptomatic.  EKG reassuring today.  We will get labs including BNP to evaluate for possible heart failure in setting of dyspnea on exertion. Start lisinopril 10 mg home monitoring and update in 1 to 2 weeks with home blood pressure.  Will consider additional agent if not at goal or increasing medication. ?  ?  ? Relevant Medications  ? lisinopril (ZESTRIL) 10 MG tablet  ? Other Relevant Orders  ? EKG 12-Lead (Completed)  ? Brain natriuretic peptide  ? Comprehensive metabolic panel  ?  ?  Other  ? Dyspnea on exertion  ?  She notes no symptoms with working out however does get DOE with going up the stairs or slow activity.  May be secondary to her weight gain EKG was reassuring today.  BNP to evaluate for heart failure though no other clinical signs on exam.  Discussed if this is not improving with treatment of her blood pressure would probably recommend cardiology evaluation. ?  ?  ? Relevant Medications  ? lisinopril (ZESTRIL) 10 MG tablet  ? ? ? ?Return in about 4 weeks (around 07/29/2021) for blood pressure. ? ?[name], MD ? ?This visit occurred during the SARS-CoV-2 public health emergency.  Safety protocols were in place, including screening questions prior to the visit, additional usage of staff PPE, and extensive cleaning of exam room while observing appropriate contact time as indicated for disinfecting solutions.  ? ?

## 2021-07-01 NOTE — Assessment & Plan Note (Addendum)
She notes no symptoms with working out however does get DOE with going up the stairs or slow activity.  May be secondary to her weight gain EKG was reassuring today.  BNP to evaluate for heart failure though no other clinical signs on exam.  Discussed if this is not improving with treatment of her blood pressure would probably recommend cardiology evaluation. ?

## 2021-07-01 NOTE — Assessment & Plan Note (Signed)
Poorly controlled and symptomatic.  EKG reassuring today.  We will get labs including BNP to evaluate for possible heart failure in setting of dyspnea on exertion. Start lisinopril 10 mg home monitoring and update in 1 to 2 weeks with home blood pressure.  Will consider additional agent if not at goal or increasing medication. ?

## 2021-07-01 NOTE — Patient Instructions (Addendum)
Blood pressure ?- start lisinopril 10 mg ?- update in 1-2 weeks with home blood pressure ?- will plan to increase as needed ? ?Blood work today ? ?Call or update if worsening chest pain, breathing, headaches ? ? ?

## 2021-07-02 ENCOUNTER — Ambulatory Visit: Payer: 59 | Admitting: Obstetrics and Gynecology

## 2021-07-02 ENCOUNTER — Encounter: Payer: Self-pay | Admitting: Obstetrics and Gynecology

## 2021-07-02 VITALS — BP 171/106 | HR 80 | Ht 64.5 in | Wt 237.7 lb

## 2021-07-02 DIAGNOSIS — N6324 Unspecified lump in the left breast, lower inner quadrant: Secondary | ICD-10-CM | POA: Diagnosis not present

## 2021-07-02 DIAGNOSIS — Z872 Personal history of diseases of the skin and subcutaneous tissue: Secondary | ICD-10-CM | POA: Diagnosis not present

## 2021-07-02 DIAGNOSIS — N644 Mastodynia: Secondary | ICD-10-CM | POA: Diagnosis not present

## 2021-07-02 LAB — COMPREHENSIVE METABOLIC PANEL
ALT: 56 IU/L — ABNORMAL HIGH (ref 0–32)
AST: 33 IU/L (ref 0–40)
Albumin/Globulin Ratio: 1.6 (ref 1.2–2.2)
Albumin: 4.1 g/dL (ref 3.8–4.8)
Alkaline Phosphatase: 101 IU/L (ref 44–121)
BUN/Creatinine Ratio: 13 (ref 9–23)
BUN: 11 mg/dL (ref 6–24)
Bilirubin Total: <0.2 mg/dL (ref 0.0–1.2)
CO2: 20 mmol/L (ref 20–29)
Calcium: 9.4 mg/dL (ref 8.7–10.2)
Chloride: 100 mmol/L (ref 96–106)
Creatinine, Ser: 0.85 mg/dL (ref 0.57–1.00)
Globulin, Total: 2.6 g/dL (ref 1.5–4.5)
Glucose: 93 mg/dL (ref 70–99)
Potassium: 4.6 mmol/L (ref 3.5–5.2)
Sodium: 138 mmol/L (ref 134–144)
Total Protein: 6.7 g/dL (ref 6.0–8.5)
eGFR: 84 mL/min/{1.73_m2} (ref >59)

## 2021-07-02 LAB — BRAIN NATRIURETIC PEPTIDE: BNP: 35 pg/mL (ref 0.0–100.0)

## 2021-07-02 NOTE — Progress Notes (Signed)
? ?  GYNECOLOGY CLINIC PROGRESS NOTE ? ? Subjective:  ?  ? Patricia Bauer is an 51 y.o. [phone] female who presents for evaluation of breast tenderness on left x 1 month.  Also notes that left breast appears larger.  Does have a h/o breast cysts in the past.  Patient does routinely do self breast exams. Symptoms do not change during menstrual cycle. Patient denies nipple discharge. Breast cancer risk factors include: family hx on mother's side (maternal aunt, dx age 67). ? ?The following portions of the patient's history were reviewed and updated as appropriate: allergies, current medications, past family history, past medical history, past social history, past surgical history, and problem list. ? ?Review of Systems ?A comprehensive review of systems was negative except for: what's noted in HPI    ?  ?Objective:  ? ? General appearance: alert and no distress ?Breasts: right breast normal without mass, skin or nipple changes or axillary nodes. Left breast with 2 x 2.5 cm mobile nodular lesion at 6 o'clock near areola.  No skin or nipple changes or axillary nodes. ? ?  ?Assessment:  ? ? breast mass, likely benign and mastalgia  ?History of cyst of breast ?  ?Plan:  ? ? Reassured the patient that the finding is most likely benign. ?Discussed need for futher evaluation. ?Arranged for mammogram. ?Arranged for ultrasound. ?Advised on NSAIDs and ice packs for pain relief.   ? ? ?Rubie Maid, MD ?[facility] ?

## 2021-07-09 ENCOUNTER — Ambulatory Visit: Payer: 59 | Admitting: Adult Health

## 2021-07-09 ENCOUNTER — Encounter: Payer: Self-pay | Admitting: Adult Health

## 2021-07-09 ENCOUNTER — Other Ambulatory Visit: Payer: Self-pay

## 2021-07-09 DIAGNOSIS — F411 Generalized anxiety disorder: Secondary | ICD-10-CM

## 2021-07-09 DIAGNOSIS — F422 Mixed obsessional thoughts and acts: Secondary | ICD-10-CM

## 2021-07-09 DIAGNOSIS — F410 Panic disorder [episodic paroxysmal anxiety] without agoraphobia: Secondary | ICD-10-CM

## 2021-07-09 DIAGNOSIS — F331 Major depressive disorder, recurrent, moderate: Secondary | ICD-10-CM | POA: Diagnosis not present

## 2021-07-09 DIAGNOSIS — F4000 Agoraphobia, unspecified: Secondary | ICD-10-CM

## 2021-07-09 MED ORDER — ALPRAZOLAM 0.25 MG PO TABS: ORAL_TABLET | ORAL | 0 refills | Status: DC

## 2021-07-09 MED ORDER — VENLAFAXINE HCL ER 75 MG PO CP24: 75.0000 mg | ORAL_CAPSULE | Freq: Every day | ORAL | 5 refills | Status: DC

## 2021-07-09 MED ORDER — VENLAFAXINE HCL ER 150 MG PO CP24: 150.0000 mg | ORAL_CAPSULE | Freq: Every day | ORAL | 5 refills | Status: DC

## 2021-07-09 NOTE — Progress Notes (Signed)
Patricia Bauer ?[id number] ?[birth year] ?51 y.o. ? ?Subjective:  ? ?Patient ID:  Patricia Bauer is a 51 y.o. (DOB [birth year]) female. ? ?Chief Complaint: No chief complaint on file. ? ? ?HPI ?Patricia Bauer presents to the office today for follow-up of MDD, GAD, insomnia, panic attacks, and agoraphobia,  ? ? ?Describes mood today as "not the best". Pleasant. Tearful at times. Mood symptoms - reports increased depression - "having darker thoughts". Reports increased anxiety. Increased panic attacks - 3 to 4 times a week.. Increased irritability. Reports increased worry and rumination. Increased picking behaviors. Feels like her daughter and grandchildren living with her is stressful. Concerned with daughter's behavior. Has been getting sick a lot and missing work. Feels like Effexor has been helpful up until the past month or so. Stating "I feel like I need a medication adjustment". Wanting to start family therapy. Varying interest and motivation. Taking medications as prescribed.  ?Energy levels improved. Active, does has a regular exercise routine - weight lifting and kick boxer.  ?Enjoys some usual interests and activities. Single. Involved in a long distance relationship - video. Divorced. Daughter (5) 2 children - 4 and 24 months. Spending time with family and friends. ?Appetite adequate - reports some emotional eating. Reporting weight loss from Noro virus and binge eating. Weight loss while sick. ?Sleeping better some nights than others. Averages 5 to 6 hours. ?Focus and concentration "a lot better". Completing tasks. Managing aspects of household. Works full time for [employer].  ?Denies SI or HI. Reports passive SI since last visit with no intent. ?Denies AH or VH. ?Denies self harm. ?Therapist - Patricia Bauer - x 2 years. ? ?Diagnosed with Fibromylagia and RA. ?Diagnosed with tachyphylaxis. ? ?Previous medication trials:  Celexa, Zoloft - flat, Wellbutrin ? ? ?GAD-7   ? ?[place] Office Visit  from 02/23/2021 in [facility] Visit from 01/21/2021 in [place] at [facility] Visit from 10/10/2019 in [place] at [facility] Visit from 03/28/2019 in [place]  ?Total GAD-7 Score '5 6 10 7  '$ ? ?  ? ?PHQ2-9   ? ?[place] Office Visit from 02/23/2021 in [facility] Visit from 01/21/2021 in [place] at [facility] Visit from 12/31/2019 in [place] at [facility] Visit from 10/10/2019 in [place] at [facility] Visit from 03/28/2019 in [place]  ?PHQ-2 Total Score '2 2 2 '$ 0 0  ?PHQ-9 Total Score '15 14 8 8 7  '$ ? ?  ? ?Flowsheet Row ED from 06/22/2021 in [place]  ?C-SSRS RISK CATEGORY No Risk  ? ?  ?  ? ?Review of Systems:  ?Review of Systems  ?Musculoskeletal:  Negative for gait problem.  ?Neurological:  Negative for tremors.  ?Psychiatric/Behavioral:    ?     Please refer to HPI  ? ?Medications: I have reviewed the patient's current medications. ? ?Current Outpatient Medications  ?Medication Sig Dispense Refill  ? venlafaxine XR (EFFEXOR XR) 75 MG 24 hr capsule Take 1 capsule (75 mg total) by mouth daily with breakfast. 30 capsule 5  ? ALPRAZolam (XANAX) 0.25 MG tablet TAKE 1 TABLET(0.25 MG) BY MOUTH DAILY AS NEEDED FOR ANXIETY OR SLEEP 30 tablet 0  ? famotidine (PEPCID) 10 MG tablet Take 10 mg by mouth daily.    ? folic acid (FOLVITE) 1 MG tablet Take 1 mg by mouth daily.    ?  gabapentin (NEURONTIN) 300 MG capsule Take 1 capsule (300 mg total) by mouth 2 (two) times daily. 60 capsule 2  ? lisinopril (ZESTRIL) 10 MG tablet Take 1 tablet (10 mg total) by mouth daily. 90 tablet 3  ? pantoprazole (PROTONIX) 40 MG tablet Take 1 tablet (40 mg total) by mouth daily. Take 30-60 min before first meal of the day 30 tablet 2  ? SUMAtriptan (IMITREX) 50 MG tablet TAKE 1 TABLET MAY REPEAT IN 2 HOURS IF HEADACHE PERSISTS  OR RECURS. DON'T USE MORE THAN TWO IN 24HRS 90 tablet 2  ? traMADol (ULTRAM) 50 MG tablet TAKE 1 TABLET(50 MG) BY MOUTH EVERY 4 HOURS FOR UP TO 5 DAYS AS NEEDED FOR COUGH OR PAIN 30 tablet 0  ? venlafaxine XR (EFFEXOR XR) 150 MG 24 hr capsule Take 1 capsule (150 mg total) by mouth daily with breakfast. 30 capsule 5  ? ?No current facility-administered medications for this visit.  ? ? ?Medication Side Effects: None ? ?Allergies:  ?Allergies  ?Allergen Reactions  ? Ibuprofen Swelling  ?  Bilateral swelling of ankles and feet  ? Diflucan [Fluconazole] Other (See Comments)  ?  Pelvic pain  ? Onion Other (See Comments)  ?  migraines   ? Topamax [Topiramate] Other (See Comments)  ?  Reaction:  Body aches  ?  ? Omeprazole   ? Hydrocodone Anxiety, Palpitations and Other (See Comments)  ?  Reaction:  Migraines   ? ? ?Past Medical History:  ?Diagnosis Date  ? Anemia   ? Anxiety   ? Colon polyp   ? Depression   ? GERD (gastroesophageal reflux disease)   ? Headache   ? migraines   ? History of hysterectomy   ? still has both ovaries and cervix  ? History of migraine headaches   ? History of ovarian cyst   ? Hypertension   ? PONV (postoperative nausea and vomiting)   ? ? ?Past Medical History, Surgical history, Social history, and Family history were reviewed and updated as appropriate.  ? ?Please see review of systems for further details on the patient's review from today.  ? ?Objective:  ? ?Physical Exam:  ?LMP 09/07/2009  ? ?Physical Exam ?Constitutional:   ?   General: She is not in acute distress. ?Musculoskeletal:     ?   General: No deformity.  ?Neurological:  ?   Mental Status: She is alert and oriented to person, place, and time.  ?   Coordination: Coordination normal.  ?Psychiatric:     ?   Attention and Perception: Attention and perception normal. She does not perceive auditory or visual hallucinations.     ?   Mood and Affect: Mood normal. Mood is not anxious or depressed. Affect is not labile, blunt, angry or  inappropriate.     ?   Speech: Speech normal.     ?   Behavior: Behavior normal.     ?   Thought Content: Thought content normal. Thought content is not paranoid or delusional. Thought content does not include homicidal or suicidal ideation. Thought content does not include homicidal or suicidal plan.     ?   Cognition and Memory: Cognition and memory normal.     ?   Judgment: Judgment normal.  ?   Comments: Insight intact  ? ? ?Lab Review:  ?   ?Component Value Date/Time  ? NA 138 07/01/2021 1305  ? NA 139 03/13/2013 2022  ? K 4.6 07/01/2021 1305  ? K 3.6  03/13/2013 2022  ? CL 100 07/01/2021 1305  ? CL 106 03/13/2013 2022  ? CO2 20 07/01/2021 1305  ? CO2 26 03/13/2013 2022  ? GLUCOSE 93 07/01/2021 1305  ? GLUCOSE 110 (H) 05/04/2020 0503  ? GLUCOSE 94 03/13/2013 2022  ? BUN 11 07/01/2021 1305  ? BUN 11 03/13/2013 2022  ? CREATININE 0.85 07/01/2021 1305  ? CREATININE 1.05 (H) 05/24/2019 1359  ? CREATININE 0.80 03/13/2013 2022  ? CALCIUM 9.4 07/01/2021 1305  ? CALCIUM 8.7 03/13/2013 2022  ? PROT 6.7 07/01/2021 1305  ? ALBUMIN 4.1 07/01/2021 1305  ? AST 33 07/01/2021 1305  ? AST 27 05/24/2019 1359  ? ALT 56 (H) 07/01/2021 1305  ? ALT 32 05/24/2019 1359  ? ALKPHOS 101 07/01/2021 1305  ? BILITOT <0.2 07/01/2021 1305  ? BILITOT 0.3 05/24/2019 1359  ? GFRNONAA >60 05/04/2020 0503  ? GFRNONAA >60 05/24/2019 1359  ? GFRNONAA >60 03/13/2013 2022  ? GFRAA 89 12/31/2019 1451  ? GFRAA >60 05/24/2019 1359  ? GFRAA >60 03/13/2013 2022  ? ? ?   ?Component Value Date/Time  ? WBC 11.0 (H) 06/11/2021 1331  ? WBC 8.1 05/14/2021 1429  ? RBC 4.55 06/11/2021 1332  ? RBC 4.59 06/11/2021 1331  ? HGB 14.5 06/11/2021 1331  ? HGB 11.9 01/17/2019 1519  ? HGB 12.0 01/17/2019 1519  ? HCT 43.4 06/11/2021 1331  ? HCT 36.1 01/17/2019 1519  ? HCT 36.5 01/17/2019 1519  ? PLT 289 06/11/2021 1331  ? PLT 333 01/17/2019 1519  ? PLT 338 01/17/2019 1519  ? MCV 94.6 06/11/2021 1331  ? MCV 82 01/17/2019 1519  ? MCV 83 01/17/2019 1519  ? MCV 85 03/13/2013 2022   ? MCH 31.6 06/11/2021 1331  ? MCHC 33.4 06/11/2021 1331  ? RDW 12.1 06/11/2021 1331  ? RDW 14.1 01/17/2019 1519  ? RDW 14.2 01/17/2019 1519  ? RDW 13.4 03/13/2013 2022  ? LYMPHSABS 2.4 06/11/2021 1331  ?

## 2021-07-12 ENCOUNTER — Encounter: Payer: Self-pay | Admitting: Family Medicine

## 2021-07-12 DIAGNOSIS — I10 Essential (primary) hypertension: Secondary | ICD-10-CM

## 2021-07-12 NOTE — Telephone Encounter (Signed)
Dr. Wert, please advise. Thanks!  

## 2021-07-12 NOTE — Telephone Encounter (Signed)
Yes, needs ov with all meds in hand  ?

## 2021-07-13 ENCOUNTER — Ambulatory Visit: Payer: 59 | Admitting: Dermatology

## 2021-07-13 ENCOUNTER — Other Ambulatory Visit: Payer: Self-pay

## 2021-07-13 DIAGNOSIS — L814 Other melanin hyperpigmentation: Secondary | ICD-10-CM

## 2021-07-13 DIAGNOSIS — L689 Hypertrichosis, unspecified: Secondary | ICD-10-CM

## 2021-07-13 DIAGNOSIS — Z1283 Encounter for screening for malignant neoplasm of skin: Secondary | ICD-10-CM | POA: Diagnosis not present

## 2021-07-13 DIAGNOSIS — D229 Melanocytic nevi, unspecified: Secondary | ICD-10-CM

## 2021-07-13 DIAGNOSIS — L578 Other skin changes due to chronic exposure to nonionizing radiation: Secondary | ICD-10-CM | POA: Diagnosis not present

## 2021-07-13 DIAGNOSIS — D1800 Hemangioma unspecified site: Secondary | ICD-10-CM

## 2021-07-13 DIAGNOSIS — L858 Other specified epidermal thickening: Secondary | ICD-10-CM

## 2021-07-13 DIAGNOSIS — L821 Other seborrheic keratosis: Secondary | ICD-10-CM

## 2021-07-13 NOTE — Patient Instructions (Addendum)
Recommend starting moisturizer with exfoliant (Urea, Salicylic acid, or Lactic acid) one to two times daily to help smooth rough and bumpy skin.  OTC options include Cetaphil Rough and Bumpy lotion (Urea), Eucerin Roughness Relief lotion or spot treatment cream (Urea), CeraVe SA lotion/cream for Rough and Bumpy skin (Sal Acid), Gold Bond Rough and Bumpy cream (Sal Acid), and AmLactin 12% lotion/cream (Lactic Acid).  If applying in morning, also apply sunscreen to sun-exposed areas, since these exfoliating moisturizers can increase sensitivity to sun. ? ?Melanoma ABCDEs ? ?Melanoma is the most dangerous type of skin cancer, and is the leading cause of death from skin disease.  You are more likely to develop melanoma if you: ?Have light-colored skin, light-colored eyes, or red or blond hair ?Spend a lot of time in the sun ?Tan regularly, either outdoors or in a tanning bed ?Have had blistering sunburns, especially during childhood ?Have a close family member who has had a melanoma ?Have atypical moles or large birthmarks ? ?Early detection of melanoma is key since treatment is typically straightforward and cure rates are extremely high if we catch it early.  ? ?The first sign of melanoma is often a change in a mole or a new dark spot.  The ABCDE system is a way of remembering the signs of melanoma. ? ?A for asymmetry:  The two halves do not match. ?B for border:  The edges of the growth are irregular. ?C for color:  A mixture of colors are present instead of an even brown color. ?D for diameter:  Melanomas are usually (but not always) greater than 42m - the size of a pencil eraser. ?E for evolution:  The spot keeps changing in size, shape, and color. ? ?Please check your skin once per month between visits. You can use a small mirror in front and a large mirror behind you to keep an eye on the back side or your body.  ? ?If you see any new or changing lesions before your next follow-up, please call to schedule a  visit. ? ?Please continue daily skin protection including broad spectrum sunscreen SPF 30+ to sun-exposed areas, reapplying every 2 hours as needed when you're outdoors.   ? ?If You Need Anything After Your Visit ? ?If you have any questions or concerns for your doctor, please call our main line at [phone] press option 4 to reach your doctor's medical assistant. If no one answers, please leave a voicemail as directed and we will return your call as soon as possible. Messages left after 4 pm will be answered the following business day.  ? ?You may also send [place] message via MyChart. We typically respond to MyChart messages within 1-2 business days. ? ?For prescription refills, please ask your pharmacy to contact our office. Our fax number is [phone] ? ?If you have an urgent issue when the clinic is closed that cannot wait until the next business day, you can page your doctor at the number below.   ? ?Please note that while we do our best to be available for urgent issues outside of office hours, we are not available 24/7.  ? ?If you have an urgent issue and are unable to reach [place] you may choose to seek medical care at your doctor's office, retail clinic, urgent care center, or emergency room. ? ?If you have a medical emergency, please immediately call 911 or go to the emergency department. ? ?Pager Numbers ? ?- Dr. KNehemiah Massed [phone]? ?- Dr. MLaurence Ferrari  [phone] ? ?- Dr. Nicole Kindred: [phone] ? ?In the event of inclement weather, please call our main line at [phone] for an update on the status of any delays or closures. ? ?Dermatology Medication Tips: ?Please keep the boxes that topical medications come in in order to help keep track of the instructions about where and how to use these. Pharmacies typically print the medication instructions only on the boxes and not directly on the medication tubes.  ? ?If your medication is too expensive, please contact our office at [phone] option 4 or  send [place] a message through [place].  ? ?We are unable to tell what your co-pay for medications will be in advance as this is different depending on your insurance coverage. However, we may be able to find a substitute medication at lower cost or fill out paperwork to get insurance to cover a needed medication.  ? ?If a prior authorization is required to get your medication covered by your insurance company, please allow [place] 1-2 business days to complete this process. ? ?Drug prices often vary depending on where the prescription is filled and some pharmacies may offer cheaper prices. ? ?The website www.goodrx.com contains coupons for medications through different pharmacies. The prices here do not account for what the cost may be with help from insurance (it may be cheaper with your insurance), but the website can give you the price if you did not use any insurance.  ?- You can print the associated coupon and take it with your prescription to the pharmacy.  ?- You may also stop by our office during regular business hours and pick up a GoodRx coupon card.  ?- If you need your prescription sent electronically to a different pharmacy, notify our office through [facility] or by phone at [phone] option 4. ? ? ? ? ?Si Usted Necesita Algo Despu?s de Su Visita ? ?Tambi?n puede enviarnos un mensaje a trav?s de MyChart. Por lo general respondemos a los mensajes de MyChart en el transcurso de 1 a 2 d?as h?biles. ? ?Para renovar recetas, por favor pida a su farmacia que se ponga en contacto con nuestra oficina. Nuestro n?mero de fax es el [phone]. ? ?Si tiene un asunto urgente cuando la cl?nica est? cerrada y que no puede esperar hasta el siguiente d?a h?bil, puede llamar/localizar a su doctor(a) al n?mero que aparece a continuaci?n.  ? ?Por favor, tenga en cuenta que aunque hacemos todo lo posible para estar disponibles para asuntos urgentes fuera del horario de oficina, no estamos disponibles las 24 horas del  d?a, los 7 d?as de la semana.  ? ?Si tiene un problema urgente y no puede comunicarse con nosotros, puede optar por buscar atenci?n m?dica  en el consultorio de su doctor(a), en una cl?nica privada, en un centro de atenci?n urgente o en una sala de emergencias. ? ?Si tiene Engineer, maintenance (IT) m?dica, por favor llame inmediatamente al 911 o vaya a la sala de emergencias. ? ?N?meros de b?per ? ?- Dr. Nehemiah Massed: [phone] ? ?- Dra. Moye: [phone] ? ?- Dra. Nicole Kindred: [phone] ? ?En caso de inclemencias del tiempo, por favor llame a nuestra l?nea principal al [phone] para una actualizaci?n sobre el estado de cualquier retraso o cierre. ? ?Consejos para la medicaci?n en dermatolog?a: ?Por favor, guarde las cajas en las que vienen los medicamentos de uso t?pico para ayudarle a seguir las instrucciones sobre d?nde y c?mo usarlos. Las farmacias generalmente imprimen las instrucciones del medicamento s?lo en las cajas y no directamente  en los tubos del medicamento.  ? ?Si su medicamento es muy caro, por favor, p?ngase en contacto con Zigmund Daniel llamando al [phone] y presione la opci?n 4 o env?enos un mensaje a trav?s de MyChart.  ? ?No podemos decirle cu?l ser? su copago por los medicamentos por adelantado ya que esto es diferente dependiendo de la cobertura de su seguro. Sin embargo, es posible que podamos encontrar un medicamento sustituto a Electrical engineer un formulario para que el seguro cubra el medicamento que se considera necesario.  ? ?Si se requiere Ardelia Mems autorizaci?n previa para que su compa??a de seguros [place] su medicamento, por favor perm?tanos de 1 a 2 d?as h?biles para completar este proceso. ? ?Los precios de los medicamentos var?an con frecuencia dependiendo del Environmental consultant de d?nde se surte la receta y alguna farmacias pueden ofrecer precios m?s baratos. ? ?El sitio web www.goodrx.com tiene cupones para medicamentos de Airline pilot. Los precios aqu? no tienen en cuenta lo que podr?a  costar con la ayuda del seguro (puede ser m?s barato con su seguro), pero el sitio web puede darle el precio si no utiliz? ning?n seguro.  ?- Puede imprimir el cup?n correspondiente y llevarlo con su receta

## 2021-07-13 NOTE — Progress Notes (Signed)
? ?  New Patient Visit ? ?Subjective  ?Patricia Bauer is a 51 y.o. female who presents for the following: TBSE  (New patient here for full body skin exam and skin cancer screening. No personal or fhx of skin cancer or abnormal moles that patient is aware of. No tanning bed use for patient. Patient does have some bumps at arms that sometimes look like pimples, no itching. /). She tends to pick at them. ? ? ?The following portions of the chart were reviewed this encounter and updated as appropriate:  ?  ?  ? ?Review of Systems:  No other skin or systemic complaints except as noted in HPI or Assessment and Plan. ? ?Objective  ?Well appearing patient in no apparent distress; mood and affect are within normal limits. ? ?A full examination was performed including scalp, head, eyes, ears, nose, lips, neck, chest, axillae, abdomen, back, buttocks, bilateral upper extremities, bilateral lower extremities, hands, feet, fingers, toes, fingernails, and toenails. All findings within normal limits unless otherwise noted below. ? ?chin ?Excessive hair growth with dark hairs ? ? ? ?Assessment & Plan  ?Hypertrichosis ?chin ? ?Recommend consultation with Lind Covert for laser hair removal treatment if bothersome for patient. Not covered by insurance.  Multiple treatments needed for best result. ? ? ?Keratosis Pilaris ?- Tiny follicular keratotic papules at arms, shoulders, upper back ?- Benign. Genetic in nature. No cure. ?- Observe. ?- If desired, patient can use an emollient (moisturizer) containing ammonium lactate, urea or salicylic acid once a day to smooth the area ? ?Lentigines ?- Scattered tan macules ?- Due to sun exposure ?- Benign-appearing, observe ?- Recommend daily broad spectrum sunscreen SPF 30+ to sun-exposed areas, reapply every 2 hours as needed. ?- Call for any changes ? ?Seborrheic Keratoses ?- Stuck-on, waxy, tan-brown papules and/or plaques  ?- Benign-appearing ?- Discussed benign etiology and prognosis. ?-  Observe ?- Call for any changes ? ?Melanocytic Nevi ?- Tan-brown and/or pink-flesh-colored symmetric macules and papules ?- Benign appearing on exam today ?- Observation ?- Call clinic for new or changing moles ?- Recommend daily use of broad spectrum spf 30+ sunscreen to sun-exposed areas.  ? ?Hemangiomas ?- Red papules ?- Discussed benign nature ?- Observe ?- Call for any changes ? ?Actinic Damage ?- Chronic condition, secondary to cumulative UV/sun exposure ?- diffuse scaly erythematous macules with underlying dyspigmentation ?- Recommend daily broad spectrum sunscreen SPF 30+ to sun-exposed areas, reapply every 2 hours as needed.  ?- Staying in the shade or wearing long sleeves, sun glasses (UVA+UVB protection) and wide brim hats (4-inch brim around the entire circumference of the hat) are also recommended for sun protection.  ?- Call for new or changing lesions. ? ?Skin cancer screening performed today. ? ?Return if symptoms worsen or fail to improve. ? ?Graciella Belton, RMA, am acting as scribe for [name], MD . ? ?Documentation: I have reviewed the above documentation for accuracy and completeness, and I agree with the above. ? ?[name] MD  ? ? ?

## 2021-07-26 ENCOUNTER — Ambulatory Visit
Admission: RE | Admit: 2021-07-26 | Discharge: 2021-07-26 | Disposition: A | Discharge status: Home / Self Care | Payer: 59 | Source: Ambulatory Visit | Attending: Obstetrics and Gynecology | Admitting: Obstetrics and Gynecology

## 2021-07-26 DIAGNOSIS — N6324 Unspecified lump in the left breast, lower inner quadrant: Secondary | ICD-10-CM | POA: Insufficient documentation

## 2021-07-26 DIAGNOSIS — N644 Mastodynia: Secondary | ICD-10-CM | POA: Diagnosis present

## 2021-07-26 DIAGNOSIS — Z872 Personal history of diseases of the skin and subcutaneous tissue: Secondary | ICD-10-CM | POA: Diagnosis present

## 2021-07-27 ENCOUNTER — Other Ambulatory Visit: Payer: Self-pay | Admitting: Obstetrics and Gynecology

## 2021-07-27 DIAGNOSIS — N6002 Solitary cyst of left breast: Secondary | ICD-10-CM

## 2021-07-28 ENCOUNTER — Telehealth: Payer: Self-pay | Admitting: Family Medicine

## 2021-07-28 ENCOUNTER — Encounter: Payer: Self-pay | Admitting: Obstetrics and Gynecology

## 2021-07-28 DIAGNOSIS — Z0279 Encounter for issue of other medical certificate: Secondary | ICD-10-CM

## 2021-07-28 NOTE — Telephone Encounter (Signed)
Placed on providers desk

## 2021-07-28 NOTE — Telephone Encounter (Signed)
Type of forms received FMLA ? ?Routed [id number] O ? ?Paperwork received by : Gwynn Burly ? ? ?Individual made aware of 3-5 business day turn around (Y/N): Y ? ?Form completed and patient made aware of charges(Y/N): Y ? ? ?Faxed to :  ? ?Form location: Place in mail box ? ?

## 2021-07-29 NOTE — Telephone Encounter (Signed)
completed

## 2021-07-30 NOTE — Telephone Encounter (Signed)
Forms faxed to number provided and pt notified.  ?

## 2021-08-06 ENCOUNTER — Ambulatory Visit: Payer: 59 | Admitting: Adult Health

## 2021-08-12 ENCOUNTER — Other Ambulatory Visit: Payer: Self-pay | Admitting: Family Medicine

## 2021-08-12 DIAGNOSIS — I10 Essential (primary) hypertension: Secondary | ICD-10-CM

## 2021-08-12 MED ORDER — AMLODIPINE BESYLATE 10 MG PO TABS: 10.0000 mg | ORAL_TABLET | Freq: Every day | ORAL | 1 refills | Status: DC

## 2021-08-12 NOTE — Addendum Note (Signed)
Addended by: [name] on: 08/12/2021 08:11 AM ? ? Modules accepted: Orders ? ?

## 2021-08-13 ENCOUNTER — Ambulatory Visit: Payer: 59 | Admitting: Adult Health

## 2021-08-19 ENCOUNTER — Ambulatory Visit
Admission: RE | Admit: 2021-08-19 | Discharge: 2021-08-19 | Disposition: A | Discharge status: Home / Self Care | Payer: 59 | Source: Ambulatory Visit | Attending: Obstetrics and Gynecology | Admitting: Obstetrics and Gynecology

## 2021-08-19 DIAGNOSIS — N6002 Solitary cyst of left breast: Secondary | ICD-10-CM | POA: Insufficient documentation

## 2021-08-20 ENCOUNTER — Ambulatory Visit: Payer: 59 | Admitting: Adult Health

## 2021-08-20 ENCOUNTER — Encounter: Payer: Self-pay | Admitting: Adult Health

## 2021-08-20 DIAGNOSIS — R058 Other specified cough: Secondary | ICD-10-CM

## 2021-08-20 MED ORDER — PREDNISONE 20 MG PO TABS: 20.0000 mg | ORAL_TABLET | Freq: Every day | ORAL | 0 refills | Status: DC

## 2021-08-20 MED ORDER — ALBUTEROL SULFATE HFA 108 (90 BASE) MCG/ACT IN AERS: 1.0000 | INHALATION_SPRAY | Freq: Four times a day (QID) | RESPIRATORY_TRACT | 2 refills | Status: DC | PRN

## 2021-08-20 MED ORDER — BENZONATATE 200 MG PO CAPS: 200.0000 mg | ORAL_CAPSULE | Freq: Three times a day (TID) | ORAL | 1 refills | Status: DC | PRN

## 2021-08-20 NOTE — Progress Notes (Signed)
? ?'@Patient'$  ID: Patricia Bauer, female    DOB: 21-Mar-1971, 51 y.o.   MRN: [id number] ? ?Chief Complaint  ?Patient presents with  ? Follow-up  ? ? ?Referring provider: ?[name], MD ? ?HPI: ?51 year old female never smoker seen for pulmonary consult for chronic cough since December 2022. ? ?TEST/EVENTS :  ? Allergy profile 05/14/2021 >  Eos 0.0 /  IgE  36   ? ?CT chest May 04, 2021 showed clear lungs ? ?08/20/2021 Follow up : Chronic cough  ?Patient returns for 57-monthfollow-up.  Patient was seen last visit for a pulmonary consult for chronic cough that began in December 2022.  CT chest was clear.   IGE was 36.  Eosinophils were 0.  Pertussis was negative.  Cough control regimen with increased Neurontin four times a day,. Steroid taper  . GERD therapy was started.  Since last visit patient got better with almost total resolution . Got norovirus with severe n/v 1 month ago . Cough started back and she can not get it stopped. Cough is minimally productive with clear mucus on occasion. Has lots of throat clearing.  ?She is now back on Neurontin Twice daily  . Not using anything for cough . Denies hemoptysis , chest pain or orthopnea.  ? ? ? ?Allergies  ?Allergen Reactions  ? Ibuprofen Swelling  ?  Bilateral swelling of ankles and feet  ? Diflucan [Fluconazole] Other (See Comments)  ?  Pelvic pain  ? Onion Other (See Comments)  ?  migraines   ? Topamax [Topiramate] Other (See Comments)  ?  Reaction:  Body aches  ?  ? Omeprazole   ? Hydrocodone Anxiety, Palpitations and Other (See Comments)  ?  Reaction:  Migraines   ? ? ?Immunization History  ?Administered Date(s) Administered  ? Influenza,inj,Quad PF,6+ Mos 01/02/2015, 01/29/2016, 01/17/2017, 01/10/2018, 01/17/2019, 01/01/2021  ? Influenza-Unspecified 12/31/2019  ? PFIZER(Purple Top)SARS-COV-2 Vaccination 07/19/2019, 08/12/2019  ? ? ?Past Medical History:  ?Diagnosis Date  ? Anemia   ? Anxiety   ? Colon polyp   ? Depression   ? GERD (gastroesophageal reflux  disease)   ? Headache   ? migraines   ? History of hysterectomy   ? still has both ovaries and cervix  ? History of migraine headaches   ? History of ovarian cyst   ? Hypertension   ? PONV (postoperative nausea and vomiting)   ? ? ?Tobacco History: ?Social History  ? ?Tobacco Use  ?Smoking Status Never  ?Smokeless Tobacco Never  ? ?Counseling given: Not Answered ? ? ?Outpatient Medications Prior to Visit  ?Medication Sig Dispense Refill  ? ALPRAZolam (XANAX) 0.25 MG tablet TAKE 1 TABLET(0.25 MG) BY MOUTH DAILY AS NEEDED FOR ANXIETY OR SLEEP 30 tablet 0  ? amLODipine (NORVASC) 10 MG tablet Take 1 tablet (10 mg total) by mouth daily. 30 tablet 1  ? famotidine (PEPCID) 10 MG tablet Take 10 mg by mouth daily.    ? folic acid (FOLVITE) 1 MG tablet Take 1 mg by mouth daily.    ? gabapentin (NEURONTIN) 300 MG capsule Take 1 capsule (300 mg total) by mouth 2 (two) times daily. 60 capsule 2  ? pantoprazole (PROTONIX) 40 MG tablet Take 1 tablet (40 mg total) by mouth daily. Take 30-60 min before first meal of the day 30 tablet 2  ? venlafaxine XR (EFFEXOR XR) 150 MG 24 hr capsule Take 1 capsule (150 mg total) by mouth daily with breakfast. 30 capsule 5  ? venlafaxine XR (  EFFEXOR XR) 75 MG 24 hr capsule Take 1 capsule (75 mg total) by mouth daily with breakfast. 30 capsule 5  ? SUMAtriptan (IMITREX) 50 MG tablet TAKE 1 TABLET MAY REPEAT IN 2 HOURS IF HEADACHE PERSISTS OR RECURS. DON'T USE MORE THAN TWO IN 24HRS (Patient not taking: Reported on 08/20/2021) 90 tablet 2  ? traMADol (ULTRAM) 50 MG tablet TAKE 1 TABLET(50 MG) BY MOUTH EVERY 4 HOURS FOR UP TO 5 DAYS AS NEEDED FOR COUGH OR PAIN (Patient not taking: Reported on 08/20/2021) 30 tablet 0  ? lisinopril (ZESTRIL) 10 MG tablet Take 1 tablet (10 mg total) by mouth daily. 90 tablet 3  ? ?No facility-administered medications prior to visit.  ? ? ? ?Review of Systems:  ? ?Constitutional:   No  weight loss, night sweats,  Fevers, chills, fatigue, or  lassitude. ? ?HEENT:   No  headaches,  Difficulty swallowing,  Tooth/dental problems, or  Sore throat,  ?              No sneezing, itching, ear ache,  ?+nasal congestion, post nasal drip,  ? ?CV:  No chest pain,  Orthopnea, PND, swelling in lower extremities, anasarca, dizziness, palpitations, syncope.  ? ?GI  No heartburn, indigestion, abdominal pain, nausea, vomiting, diarrhea, change in bowel habits, loss of appetite, bloody stools.  ? ?Resp:   No chest wall deformity ? ?Skin: no rash or lesions. ? ?GU: no dysuria, change in color of urine, no urgency or frequency.  No flank pain, no hematuria  ? ?MS:  No joint pain or swelling.  No decreased range of motion.  No back pain. ? ? ? ?Physical Exam ? ?BP 124/90 (BP Location: Left Arm, Patient Position: Sitting, Cuff Size: Large)   Pulse 93   Temp 98.1 ?F (36.7 ?C) (Oral)   Ht 5' 4.5" (1.638 m)   Wt 236 lb 6.4 oz (107.2 kg)   LMP 09/07/2009   SpO2 97%   BMI 39.95 kg/m?  ? ?GEN: A/Ox3; pleasant , NAD, well nourished , +barking cough  ?  ?HEENT:  [place]/AT,   NOSE-clear, THROAT-clear, no lesions, no postnasal drip or exudate noted.  ? ?NECK:  Supple w/ fair ROM; no JVD; normal carotid impulses w/o bruits; no thyromegaly or nodules palpated; no lymphadenopathy.   ? ?RESP  Clear  P & A; w/o, wheezes/ rales/ or rhonchi. no accessory muscle use, no dullness to percussion ? ?CARD:  RRR, no m/r/g, no peripheral edema, pulses intact, no cyanosis or clubbing. ? ?GI:   Soft & nt; nml bowel sounds; no organomegaly or masses detected.  ? ?Musco: Warm bil, no deformities or joint swelling noted.  ? ?Neuro: alert, no focal deficits noted.   ? ?Skin: Warm, no lesions or rashes ? ? ? ?Lab Results: ? ?CBC ? ? ? ? ?ProBNP ? ? ? ? ?No results found for: NITRICOXIDE ? ? ? ? ? ?Assessment & Plan:  ? ?Upper airway cough syndrome ?UACS - improved briefly , returned after GI virus w/ vomiting .  ?Restart cough regimen  ?Prev CT chest neg .  ?Check PFT on return  ? ? ?Plan  ?Patient Instructions  ?Restart  Chlorpheniramine '4mg'$  At bedtime   ?Delsym 2 tsp Twice daily  for cough As needed   ?Tessalon Three times a day  for cough as needed  ?Prednisone '20mg'$  .daily for 5 days , take with food.  ?Continue on Protonix and Pepcid .  ?Continue on Neurontin Twice daily   ?Albuterol inhaler  1-2 puffs every 6hr as needed  ?Follow up with Dr. Melvyn Novas  in 4 weeks with PFT and As needed   ?Please contact office for sooner follow up if symptoms do not improve or worsen or seek emergency care  ? ? ?  ? ? ? ? ?Rexene Edison, NP ?08/20/2021 ? ?

## 2021-08-20 NOTE — Patient Instructions (Addendum)
Restart Chlorpheniramine '4mg'$  At bedtime   ?Delsym 2 tsp Twice daily  for cough As needed   ?Tessalon Three times a day  for cough as needed  ?Prednisone '20mg'$  .daily for 5 days , take with food.  ?Continue on Protonix and Pepcid .  ?Continue on Neurontin Twice daily   ?Albuterol inhaler 1-2 puffs every 6hr as needed  ?Follow up with Dr. Melvyn Novas  in 4 weeks with PFT and As needed   ?Please contact office for sooner follow up if symptoms do not improve or worsen or seek emergency care  ? ? ?

## 2021-08-20 NOTE — Addendum Note (Signed)
Addended by: Vanessa Barbara on: 08/20/2021 05:02 PM ? ? Modules accepted: Orders ? ?

## 2021-08-20 NOTE — Assessment & Plan Note (Signed)
UACS - improved briefly , returned after GI virus w/ vomiting .  ?Restart cough regimen  ?Prev CT chest neg .  ?Check PFT on return  ? ? ?Plan  ?Patient Instructions  ?Restart Chlorpheniramine '4mg'$  At bedtime   ?Delsym 2 tsp Twice daily  for cough As needed   ?Tessalon Three times a day  for cough as needed  ?Prednisone '20mg'$  .daily for 5 days , take with food.  ?Continue on Protonix and Pepcid .  ?Continue on Neurontin Twice daily   ?Albuterol inhaler 1-2 puffs every 6hr as needed  ?Follow up with Patricia Bauer  in 4 weeks with PFT and As needed   ?Please contact office for sooner follow up if symptoms do not improve or worsen or seek emergency care  ? ? ?  ? ?

## 2021-08-23 ENCOUNTER — Other Ambulatory Visit: Payer: Self-pay

## 2021-08-23 DIAGNOSIS — R058 Other specified cough: Secondary | ICD-10-CM

## 2021-08-23 MED ORDER — GABAPENTIN 300 MG PO CAPS: 300.0000 mg | ORAL_CAPSULE | Freq: Two times a day (BID) | ORAL | 2 refills | Status: AC

## 2021-08-23 NOTE — Telephone Encounter (Signed)
Gabapentin '300mg'$  sent to preferred pharmacy. ?Nothing further needed.  ? ?

## 2021-09-03 ENCOUNTER — Other Ambulatory Visit: Payer: Self-pay | Admitting: Internal Medicine

## 2021-09-03 ENCOUNTER — Ambulatory Visit: Payer: 59 | Admitting: Adult Health

## 2021-09-03 DIAGNOSIS — R058 Other specified cough: Secondary | ICD-10-CM

## 2021-09-05 ENCOUNTER — Other Ambulatory Visit: Payer: Self-pay | Admitting: Family Medicine

## 2021-09-05 DIAGNOSIS — I10 Essential (primary) hypertension: Secondary | ICD-10-CM

## 2021-09-06 ENCOUNTER — Other Ambulatory Visit: Payer: Self-pay | Admitting: Internal Medicine

## 2021-09-10 ENCOUNTER — Encounter: Payer: Self-pay | Admitting: Adult Health

## 2021-09-10 ENCOUNTER — Ambulatory Visit: Payer: 59 | Admitting: Family Medicine

## 2021-09-10 ENCOUNTER — Ambulatory Visit: Payer: 59 | Admitting: Adult Health

## 2021-09-10 VITALS — BP 130/90 | HR 93 | Temp 97.4°F | Wt 234.0 lb

## 2021-09-10 DIAGNOSIS — M064 Inflammatory polyarthropathy: Secondary | ICD-10-CM

## 2021-09-10 DIAGNOSIS — J4520 Mild intermittent asthma, uncomplicated: Secondary | ICD-10-CM

## 2021-09-10 DIAGNOSIS — J0100 Acute maxillary sinusitis, unspecified: Secondary | ICD-10-CM | POA: Diagnosis not present

## 2021-09-10 DIAGNOSIS — F4000 Agoraphobia, unspecified: Secondary | ICD-10-CM

## 2021-09-10 DIAGNOSIS — F331 Major depressive disorder, recurrent, moderate: Secondary | ICD-10-CM

## 2021-09-10 DIAGNOSIS — F411 Generalized anxiety disorder: Secondary | ICD-10-CM | POA: Diagnosis not present

## 2021-09-10 DIAGNOSIS — M1990 Unspecified osteoarthritis, unspecified site: Secondary | ICD-10-CM

## 2021-09-10 DIAGNOSIS — B379 Candidiasis, unspecified: Secondary | ICD-10-CM | POA: Diagnosis not present

## 2021-09-10 DIAGNOSIS — F422 Mixed obsessional thoughts and acts: Secondary | ICD-10-CM | POA: Diagnosis not present

## 2021-09-10 DIAGNOSIS — F410 Panic disorder [episodic paroxysmal anxiety] without agoraphobia: Secondary | ICD-10-CM

## 2021-09-10 MED ORDER — ALPRAZOLAM 0.25 MG PO TABS: ORAL_TABLET | ORAL | 2 refills | Status: DC

## 2021-09-10 MED ORDER — AMOXICILLIN-POT CLAVULANATE 875-125 MG PO TABS: 1.0000 | ORAL_TABLET | Freq: Two times a day (BID) | ORAL | 0 refills | Status: AC

## 2021-09-10 NOTE — Progress Notes (Signed)
Patricia Bauer [id number] 07-02-1970 51 y.o.  Subjective:   Patient ID:  Patricia Bauer is a 51 y.o. (DOB Mar 23, 1971) female.  Chief Complaint: No chief complaint on file.   HPI Patricia Bauer presents to the office today for follow-up of MDD, GAD, insomnia, panic attacks, and agoraphobia,   Describes mood today as "ok". Pleasant. Tearful at times. Mood symptoms - reports increased depression and anxiety. Feeling more irritable. Increased panic attacks. Reports increased worry and rumination. Has started picking at skin again. Broke up with partner 2 days ago. Daughter also told her she wants to move out and in with another relative. Stating "things have been difficult between the two of [place]". Feels like medication changes have been helpful. Stating "I feel like things are manageable".  Varying interest and motivation. Taking medications as prescribed.  Energy levels improved. Active, does has a regular exercise routine - weight lifting and kick boxer.  Enjoys some usual interests and activities. Single. Divorced. Daughter (44) 2 children - 4 and 24 months. Spending time with family and friends. Appetite adequate - "it's waning".  Reporting weight loss  - "working out". Sleeping better some nights than others. Averages 7 to 8 hours. Focus and concentration "pretty horrible". Completing tasks. Managing aspects of household. Works full time for [employer].  Denies SI or HI. Reports passive SI since last visit with no intent. Denies AH or VH. Denies self harm. Therapist - Marya Fossa - x 2 years.  Diagnosed with Fibromylagia and RA. Diagnosed with tachyphylaxis.  Previous medication trials:  Celexa, Zoloft - flat, Wellbutrin   GAD-7    Flowsheet Row Office Visit from 02/23/2021 in [facility] Visit from 01/21/2021 in [place] at [facility] Visit from 10/10/2019 in [place] at [facility] Visit from 03/28/2019 in [place]  Total GAD-7 Score '5 6 10 7      '$ PHQ2-9    [place] Visit from 02/23/2021 in [facility] Visit from 01/21/2021 in [place] at [facility] Visit from 12/31/2019 in [place] at [facility] Visit from 10/10/2019 in [place] at [facility] Visit from 03/28/2019 in [place]  PHQ-2 Total Score '2 2 2 '$ 0 0  PHQ-9 Total Score '15 14 8 8 7      '$ Flowsheet Row ED from 06/22/2021 in [place] No Risk        Review of Systems:  Review of Systems  Musculoskeletal:  Negative for gait problem.  Neurological:  Negative for tremors.  Psychiatric/Behavioral:         Please refer to HPI   Medications: I have reviewed the patient's current medications.  Current Outpatient Medications  Medication Sig Dispense Refill   albuterol (VENTOLIN HFA) 108 (90 Base) MCG/ACT inhaler Inhale 1-2 puffs into the lungs every 6 (six) hours as needed. 8 g 2   ALPRAZolam (XANAX) 0.25 MG tablet TAKE 1 TABLET(0.25 MG) BY MOUTH DAILY AS NEEDED FOR ANXIETY OR SLEEP 30 tablet 0   amLODipine (NORVASC) 10 MG tablet Take 1 tablet (10 mg total) by mouth daily. 30 tablet 1   amoxicillin-clavulanate (AUGMENTIN) 875-125 MG tablet Take 1 tablet by mouth 2 (two) times daily for 7 days. 14 tablet 0   benzonatate (TESSALON) 200 MG capsule Take 1 capsule (200 mg total) by mouth 3 (three) times daily as needed for cough.  30 capsule 1   famotidine (PEPCID) 10 MG tablet Take 10 mg by mouth daily.     gabapentin (NEURONTIN) 300 MG capsule Take 1 capsule (300 mg total) by mouth 2 (two) times daily. 60 capsule 2   pantoprazole (PROTONIX) 40 MG tablet TAKE 1 TABLET(40 MG) BY MOUTH DAILY 30 TO 60 MINUTES BEFORE FIRST MEAL OF THE DAY 30 tablet 2   SUMAtriptan (IMITREX) 50 MG tablet TAKE 1 TABLET MAY REPEAT IN 2 HOURS IF HEADACHE PERSISTS OR RECURS. DON'T USE  MORE THAN TWO IN 24HRS 90 tablet 2   traMADol (ULTRAM) 50 MG tablet TAKE 1 TABLET(50 MG) BY MOUTH EVERY 4 HOURS FOR UP TO 5 DAYS AS NEEDED FOR COUGH OR PAIN 30 tablet 0   venlafaxine XR (EFFEXOR XR) 150 MG 24 hr capsule Take 1 capsule (150 mg total) by mouth daily with breakfast. 30 capsule 5   venlafaxine XR (EFFEXOR XR) 75 MG 24 hr capsule Take 1 capsule (75 mg total) by mouth daily with breakfast. 30 capsule 5   No current facility-administered medications for this visit.    Medication Side Effects: None  Allergies:  Allergies  Allergen Reactions   Ibuprofen Swelling    Bilateral swelling of ankles and feet   Diflucan [Fluconazole] Other (See Comments)    Pelvic pain   Onion Other (See Comments)    migraines    Topamax [Topiramate] Other (See Comments)    Reaction:  Body aches     Omeprazole    Hydrocodone Anxiety, Palpitations and Other (See Comments)    Reaction:  Migraines     Past Medical History:  Diagnosis Date   Anemia    Anxiety    Colon polyp    Depression    GERD (gastroesophageal reflux disease)    Headache    migraines    History of hysterectomy    still has both ovaries and cervix   History of migraine headaches    History of ovarian cyst    Hypertension    PONV (postoperative nausea and vomiting)     Past Medical History, Surgical history, Social history, and Family history were reviewed and updated as appropriate.   Please see review of systems for further details on the patient's review from today.   Objective:   Physical Exam:  LMP 09/07/2009   Physical Exam Constitutional:      General: She is not in acute distress. Musculoskeletal:        General: No deformity.  Neurological:     Mental Status: She is alert and oriented to person, place, and time.     Coordination: Coordination normal.  Psychiatric:        Attention and Perception: Attention and perception normal. She does not perceive auditory or visual hallucinations.        Mood  and Affect: Mood normal. Mood is not anxious or depressed. Affect is not labile, blunt, angry or inappropriate.        Speech: Speech normal.        Behavior: Behavior normal.        Thought Content: Thought content normal. Thought content is not paranoid or delusional. Thought content does not include homicidal or suicidal ideation. Thought content does not include homicidal or suicidal plan.        Cognition and Memory: Cognition and memory normal.        Judgment: Judgment normal.     Comments: Insight intact    Lab Review:  Component Value Date/Time   NA 138 07/01/2021 1305   NA 139 03/13/2013 2022   K 4.6 07/01/2021 1305   K 3.6 03/13/2013 2022   CL 100 07/01/2021 1305   CL 106 03/13/2013 2022   CO2 20 07/01/2021 1305   CO2 26 03/13/2013 2022   GLUCOSE 93 07/01/2021 1305   GLUCOSE 110 (H) 05/04/2020 0503   GLUCOSE 94 03/13/2013 2022   BUN 11 07/01/2021 1305   BUN 11 03/13/2013 2022   CREATININE 0.85 07/01/2021 1305   CREATININE 1.05 (H) 05/24/2019 1359   CREATININE 0.80 03/13/2013 2022   CALCIUM 9.4 07/01/2021 1305   CALCIUM 8.7 03/13/2013 2022   PROT 6.7 07/01/2021 1305   ALBUMIN 4.1 07/01/2021 1305   AST 33 07/01/2021 1305   AST 27 05/24/2019 1359   ALT 56 (H) 07/01/2021 1305   ALT 32 05/24/2019 1359   ALKPHOS 101 07/01/2021 1305   BILITOT <0.2 07/01/2021 1305   BILITOT 0.3 05/24/2019 1359   GFRNONAA >60 05/04/2020 0503   GFRNONAA >60 05/24/2019 1359   GFRNONAA >60 03/13/2013 2022   GFRAA 89 12/31/2019 1451   GFRAA >60 05/24/2019 1359   GFRAA >60 03/13/2013 2022       Component Value Date/Time   WBC 11.0 (H) 06/11/2021 1331   WBC 8.1 05/14/2021 1429   RBC 4.55 06/11/2021 1332   RBC 4.59 06/11/2021 1331   HGB 14.5 06/11/2021 1331   HGB 11.9 01/17/2019 1519   HGB 12.0 01/17/2019 1519   HCT 43.4 06/11/2021 1331   HCT 36.1 01/17/2019 1519   HCT 36.5 01/17/2019 1519   PLT 289 06/11/2021 1331   PLT 333 01/17/2019 1519   PLT 338 01/17/2019 1519   MCV  94.6 06/11/2021 1331   MCV 82 01/17/2019 1519   MCV 83 01/17/2019 1519   MCV 85 03/13/2013 2022   MCH 31.6 06/11/2021 1331   MCHC 33.4 06/11/2021 1331   RDW 12.1 06/11/2021 1331   RDW 14.1 01/17/2019 1519   RDW 14.2 01/17/2019 1519   RDW 13.4 03/13/2013 2022   LYMPHSABS 2.4 06/11/2021 1331   LYMPHSABS 2.8 01/17/2019 1519   LYMPHSABS 3.0 01/17/2019 1519   MONOABS 0.6 06/11/2021 1331   EOSABS 0.1 06/11/2021 1331   EOSABS 0.2 01/17/2019 1519   EOSABS 0.2 01/17/2019 1519   BASOSABS 0.1 06/11/2021 1331   BASOSABS 0.0 01/17/2019 1519   BASOSABS 0.0 01/17/2019 1519    No results found for: POCLITH, LITHIUM   No results found for: PHENYTOIN, PHENOBARB, VALPROATE, CBMZ   .res Assessment: Plan:    Plan:  PDMP reviewed  Effexor XR 150 and '75mg'$  every morning Xanax 0.'25mg'$  as needed  RTC 8 weeks  Patient advised to contact office with any questions, adverse effects, or acute worsening in signs and symptoms.  Discussed potential benefits, risk, and side effects of benzodiazepines to include potential risk of tolerance and dependence, as well as possible drowsiness.  Advised patient not to drive if experiencing drowsiness and to take lowest possible effective dose to minimize risk of dependence and tolerance.  Diagnoses and all orders for this visit:  Mixed obsessional thoughts and acts  Panic disorder without agoraphobia  Major depressive disorder, recurrent episode, moderate (HCC)  GAD (generalized anxiety disorder)  Panic attacks  Agoraphobia     Please see After Visit Summary for patient specific instructions.  Future Appointments  Date Time Provider [place]  09/29/2021  8:15 AM [name], MD [facility] None  10/04/2021  3:00 PM [facility]  [facility] None  10/04/2021  4:00 PM Parrett, Tammy S, NP [facility] None  06/10/2022  1:30 PM [facility] None  06/10/2022  1:45 PM Celso Amy, NP [facility] None    No orders of the defined types were  placed in this encounter.   -------------------------------

## 2021-09-10 NOTE — Assessment & Plan Note (Signed)
Recurrent symptoms. Getting evaluated for asthma. Taking albuterol. Discussed if diagnosed with asthma would recommend pneumonia vaccine.

## 2021-09-10 NOTE — Patient Instructions (Signed)
Antibiotic   Call if yeast infection worsens or does not respond to monostat  Sinus Congestion 1) Neti Pot (Saline rinse) -- 2 times day -- if tolerated 2) Flonase (Store Brand ok) - once daily 3) Over the counter congestion medications  Cough 1) Cough drops can be helpful 2) Nyquil (or nighttime cough medication) 3) Honey is proven to be one of the best cough medications  4) Cough medicine with Dextromethorphan can also be helpful  Sore Throat 1) Honey as above, cough drops 2) Ibuprofen or Aleve can be helpful 3) Salt water Gargles

## 2021-09-10 NOTE — Assessment & Plan Note (Signed)
Following with rheum - originally thought RA but reversed diagnosis. Appreciate rheum support.

## 2021-09-10 NOTE — Progress Notes (Signed)
Subjective:     Patricia Bauer is a 51 y.o. female presenting for Facial Pain (X week ), Nasal Congestion, and Cough (This is chronic)     Cough Associated symptoms include ear pain (bilateral). Pertinent negatives include no chills, headaches, sore throat or shortness of breath.  Sinusitis This is a new problem. The current episode started 1 to 4 weeks ago. The problem is unchanged. There has been no fever. Associated symptoms include congestion, coughing, ear pain (bilateral) and sinus pressure. Pertinent negatives include no chills, headaches, shortness of breath, sneezing or sore throat. Past treatments include nothing.    Review of Systems  Constitutional:  Negative for chills.  HENT:  Positive for congestion, ear pain (bilateral) and sinus pressure. Negative for sneezing and sore throat.   Respiratory:  Positive for cough. Negative for shortness of breath.   Neurological:  Negative for headaches.    Social History   Tobacco Use  Smoking Status Never  Smokeless Tobacco Never        Objective:    BP Readings from Last 3 Encounters:  09/10/21 130/90  08/20/21 124/90  07/02/21 (!) 171/106   Wt Readings from Last 3 Encounters:  09/10/21 234 lb (106.1 kg)  08/20/21 236 lb 6.4 oz (107.2 kg)  07/02/21 237 lb 11.2 oz (107.8 kg)    BP 130/90   Pulse 93   Temp (!) 97.4 F (36.3 C) (Temporal)   Wt 234 lb (106.1 kg)   LMP 09/07/2009   SpO2 97%   BMI 39.55 kg/m    Physical Exam Constitutional:      General: She is not in acute distress.    Appearance: She is well-developed. She is not diaphoretic.  HENT:     Head: Normocephalic and atraumatic.     Right Ear: Tympanic membrane and ear canal normal.     Left Ear: Tympanic membrane and ear canal normal.     Nose: Mucosal edema and rhinorrhea present.     Right Sinus: Maxillary sinus tenderness present. No frontal sinus tenderness.     Left Sinus: Maxillary sinus tenderness present. No frontal sinus  tenderness.     Mouth/Throat:     Pharynx: Uvula midline. Posterior oropharyngeal erythema present. No oropharyngeal exudate.     Tonsils: 0 on the right. 0 on the left.  Eyes:     General: No scleral icterus.    Conjunctiva/sclera: Conjunctivae normal.  Cardiovascular:     Rate and Rhythm: Normal rate and regular rhythm.     Heart sounds: Normal heart sounds. No murmur heard. Pulmonary:     Effort: Pulmonary effort is normal. No respiratory distress.     Breath sounds: Normal breath sounds.  Musculoskeletal:     Cervical back: Neck supple.  Lymphadenopathy:     Cervical: No cervical adenopathy.  Skin:    General: Skin is warm and dry.     Capillary Refill: Capillary refill takes less than 2 seconds.  Neurological:     Mental Status: She is alert.          Assessment & Plan:   Problem List Items Addressed This Visit       Respiratory   Reactive airway disease    Recurrent symptoms. Getting evaluated for asthma. Taking albuterol. Discussed if diagnosed with asthma would recommend pneumonia vaccine.          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 Appreciate rheum support.        Other Visit Diagnoses     Acute non-recurrent maxillary sinusitis    -  Primary   Relevant Medications   amoxicillin-clavulanate (AUGMENTIN) 875-125 MG tablet   Yeast infection          She notes yeast infection but allergy to fluconazole. Discussed could send in alternative, but she will try monostat.   Sinus infection - start abx    Return if symptoms worsen or fail to improve.  [name], MD

## 2021-09-29 ENCOUNTER — Ambulatory Visit: Payer: 59 | Admitting: Dermatology

## 2021-09-29 ENCOUNTER — Encounter: Payer: Self-pay | Admitting: Dermatology

## 2021-09-29 DIAGNOSIS — L820 Inflamed seborrheic keratosis: Secondary | ICD-10-CM | POA: Diagnosis not present

## 2021-09-29 DIAGNOSIS — L814 Other melanin hyperpigmentation: Secondary | ICD-10-CM | POA: Diagnosis not present

## 2021-09-29 DIAGNOSIS — L821 Other seborrheic keratosis: Secondary | ICD-10-CM | POA: Diagnosis not present

## 2021-09-29 NOTE — Patient Instructions (Signed)
Cryotherapy Aftercare  Wash gently with soap and water everyday.   Apply Vaseline daily until healed.   Prior to procedure, discussed risks of blister formation, small wound, skin dyspigmentation, or rare scar following cryotherapy. Recommend Vaseline ointment to treated areas while healing.    Seborrheic Keratosis  What causes seborrheic keratoses? Seborrheic keratoses are harmless, common skin growths that first appear during adult life.  As time goes by, more growths appear.  Some people may develop a large number of them.  Seborrheic keratoses appear on both covered and uncovered body parts.  They are not caused by sunlight.  The tendency to develop seborrheic keratoses can be inherited.  They vary in color from skin-colored to gray, brown, or even black.  They can be either smooth or have a rough, warty surface.   Seborrheic keratoses are superficial and look as if they were stuck on the skin.  Under the microscope this type of keratosis looks like layers upon layers of skin.  That is why at times the top layer may seem to fall off, but the rest of the growth remains and re-grows.    Treatment Seborrheic keratoses do not need to be treated, but can easily be removed in the office.  Seborrheic keratoses often cause symptoms when they rub on clothing or jewelry.  Lesions can be in the way of shaving.  If they become inflamed, they can cause itching, soreness, or burning.  Removal of a seborrheic keratosis can be accomplished by freezing, burning, or surgery. If any spot bleeds, scabs, or grows rapidly, please return to have it checked, as these can be an indication of a skin cancer.   Due to recent changes in healthcare laws, you may see results of your pathology and/or laboratory studies on MyChart before the doctors have had a chance to review them. We understand that in some cases there may be results that are confusing or concerning to you. Please understand that not all results are  received at the same time and often the doctors may need to interpret multiple results in order to provide you with the best plan of care or course of treatment. Therefore, we ask that you please give [place] 2 business days to thoroughly review all your results before contacting the office for clarification. Should we see a critical lab result, you will be contacted sooner.   If You Need Anything After Your Visit  If you have any questions or concerns for your doctor, please call our main line at [phone] and press option 4 to reach your doctor's medical assistant. If no one answers, please leave a voicemail as directed and we will return your call as soon as possible. Messages left after 4 pm will be answered the following business day.   You may also send [place] a message via [place]. We typically respond to MyChart messages within 1-2 business days.  For prescription refills, please ask your pharmacy to contact our office. Our fax number is [phone].  If you have an urgent issue when the clinic is closed that cannot wait until the next business day, you can page your doctor at the number below.    Please note that while we do our best to be available for urgent issues outside of office hours, we are not available 24/7.   If you have an urgent issue and are unable to reach [place], you may choose to seek medical care at your doctor's office, retail clinic, urgent care center, or emergency room.  If you have a medical emergency, please immediately call 911 or go to the emergency department.  Pager Numbers  - Dr. Nehemiah Massed: [phone]  - Dr. Laurence Ferrari: [phone]  - Dr. Nicole Kindred: [phone]  In the event of inclement weather, please call our main line at [phone] for an update on the status of any delays or closures.  Dermatology Medication Tips: Please keep the boxes that topical medications come in in order to help keep track of the instructions about where and how to use these.  Pharmacies typically print the medication instructions only on the boxes and not directly on the medication tubes.   If your medication is too expensive, please contact our office at [phone] option 4 or send [place] a message through [place].   We are unable to tell what your co-pay for medications will be in advance as this is different depending on your insurance coverage. However, we may be able to find a substitute medication at lower cost or fill out paperwork to get insurance to cover a needed medication.   If a prior authorization is required to get your medication covered by your insurance company, please allow [place] 1-2 business days to complete this process.  Drug prices often vary depending on where the prescription is filled and some pharmacies may offer cheaper prices.  The website www.goodrx.com contains coupons for medications through different pharmacies. The prices here do not account for what the cost may be with help from insurance (it may be cheaper with your insurance), but the website can give you the price if you did not use any insurance.  - You can print the associated coupon and take it with your prescription to the pharmacy.  - You may also stop by our office during regular business hours and pick up a GoodRx coupon card.  - If you need your prescription sent electronically to a different pharmacy, notify our office through [facility] or by phone at [phone] option 4.     Si Usted Necesita Algo Despus de Su Visita  Tambin puede enviarnos un mensaje a travs de Pharmacist, community. Por lo general respondemos a los mensajes de MyChart en el transcurso de 1 a 2 das hbiles.  Para renovar recetas, por favor pida a su farmacia que se ponga en contacto con nuestra oficina. Harland Dingwall de fax es [place] [phone].  Si tiene un asunto urgente cuando la clnica est cerrada y que no puede esperar hasta el siguiente da hbil, puede llamar/localizar a su doctor(a) al nmero  que aparece a continuacin.   Por favor, tenga en cuenta que aunque hacemos todo lo posible para estar disponibles para asuntos urgentes fuera del horario de [place], no estamos disponibles las 24 horas del da, los 7 das de la [place].   Si tiene un problema urgente y no puede comunicarse con nosotros, puede optar por buscar atencin mdica  en el consultorio de su doctor(a), en una clnica privada, en un centro de atencin urgente o en una sala de emergencias.  Si tiene Engineering geologist, por favor llame inmediatamente al 911 o vaya a la sala de emergencias.  Nmeros de bper  - Dr. Nehemiah Massed: [phone]  - Dra. Moye: [phone]  - Dra. Nicole Kindred: [phone]  En caso de inclemencias del [place], por favor llame a Johnsie Kindred principal al [phone] para una actualizacin sobre el [place] de cualquier retraso o cierre.  Consejos para la medicacin en dermatologa: Por favor, guarde las cajas en las que vienen los medicamentos de  uso tpico para ayudarle a seguir las [employer] dnde y cmo usarlos. Las farmacias generalmente imprimen las instrucciones del medicamento slo en las cajas y no directamente en los tubos del [place].   Si su medicamento es muy caro, por favor, pngase en contacto con Zigmund Daniel llamando al [phone] y presione la opcin 4 o envenos un mensaje a travs de Pharmacist, community.   No podemos decirle cul ser su copago por los medicamentos por adelantado ya que esto es diferente dependiendo de la cobertura de su seguro. Sin embargo, es posible que podamos encontrar un medicamento sustituto a Electrical engineer un formulario para que el seguro cubra el medicamento que se considera necesario.   Si se requiere una autorizacin previa para que su compaa de seguros [place] su medicamento, por favor permtanos de 1 a 2 das hbiles para completar este proceso.  Los precios de los medicamentos varan con frecuencia dependiendo del Environmental consultant de dnde se  surte la receta y alguna farmacias pueden ofrecer precios ms baratos.  El sitio web www.goodrx.com tiene cupones para medicamentos de Airline pilot. Los precios aqu no tienen en cuenta lo que podra costar con la ayuda del seguro (puede ser ms barato con su seguro), pero el sitio web puede darle el precio si no utiliz Research scientist (physical sciences).  - Puede imprimir el cupn correspondiente y llevarlo con su receta a la farmacia.  - Tambin puede pasar por nuestra oficina durante el horario de atencin regular y Charity fundraiser una tarjeta de cupones de GoodRx.  - Si necesita que su receta se enve electrnicamente a una farmacia diferente, informe a nuestra oficina a travs de MyChart de [place] o por telfono llamando al [phone] y presione la opcin 4.

## 2021-09-29 NOTE — Progress Notes (Signed)
   Follow-Up Visit   Subjective  Patricia Bauer is a 51 y.o. female who presents for the following: lesion (Left upper arm. Dur: years. Raised, scaly, peels at times. Itches at times. Patient scratches at area).  The patient has spots, moles and lesions to be evaluated, some may be new or changing and the patient has concerns that these could be cancer.   The following portions of the chart were reviewed this encounter and updated as appropriate:      Review of Systems: No other skin or systemic complaints except as noted in HPI or Assessment and Plan.   Objective  Well appearing patient in no apparent distress; mood and affect are within normal limits.  A focused examination was performed including face, left upper arm. Relevant physical exam findings are noted in the Assessment and Plan.  Left Upper Arm - Anterior Waxy flesh papule with erythema   Assessment & Plan  Inflamed seborrheic keratosis Left Upper Arm - Anterior  Symptomatic, irritating, patient would like treated.  Destruction of lesion - Left Upper Arm - Anterior  Destruction method: cryotherapy   Informed consent: discussed and consent obtained   Lesion destroyed using liquid nitrogen: Yes   Region frozen until ice ball extended beyond lesion: Yes   Outcome: patient tolerated procedure well with no complications   Post-procedure details: wound care instructions given   Additional details:  Prior to procedure, discussed risks of blister formation, small wound, skin dyspigmentation, or rare scar following cryotherapy. Recommend Vaseline ointment to treated areas while healing.    Lentigines. Arms, face.  - Scattered tan macules - Due to sun exposure - Benign-appering, observe - Recommend daily broad spectrum sunscreen SPF 30+ to sun-exposed areas, reapply every 2 hours as needed. - Call for any changes  Seborrheic Keratoses - Stuck-on, waxy, tan-brown papules - Benign-appearing - Discussed benign  etiology and prognosis. - Observe - Call for any changes  Return if symptoms worsen or fail to improve.  I, Patricia Bauer, CMA, am acting as scribe for Patricia Patty, MD.  Documentation: I have reviewed the above documentation for accuracy and completeness, and I agree with the above.  Patricia Patty MD

## 2021-10-04 ENCOUNTER — Other Ambulatory Visit: Payer: Self-pay | Admitting: Family Medicine

## 2021-10-04 ENCOUNTER — Ambulatory Visit (INDEPENDENT_AMBULATORY_CARE_PROVIDER_SITE_OTHER): Payer: 59 | Admitting: Internal Medicine

## 2021-10-04 ENCOUNTER — Ambulatory Visit (INDEPENDENT_AMBULATORY_CARE_PROVIDER_SITE_OTHER): Payer: 59 | Admitting: Adult Health

## 2021-10-04 ENCOUNTER — Encounter: Payer: Self-pay | Admitting: Adult Health

## 2021-10-04 DIAGNOSIS — J309 Allergic rhinitis, unspecified: Secondary | ICD-10-CM | POA: Diagnosis not present

## 2021-10-04 DIAGNOSIS — R058 Other specified cough: Secondary | ICD-10-CM

## 2021-10-04 DIAGNOSIS — J4520 Mild intermittent asthma, uncomplicated: Secondary | ICD-10-CM

## 2021-10-04 DIAGNOSIS — I10 Essential (primary) hypertension: Secondary | ICD-10-CM

## 2021-10-04 LAB — PULMONARY FUNCTION TEST
DL/VA % pred: 97 %
DL/VA: 4.76 ml/min/mmHg/L
DLCO cor % pred: 77 %
DLCO cor: 19.25 ml/min/mmHg
DLCO unc % pred: 77 %
DLCO unc: 19.25 ml/min/mmHg
FEF 25-75 Post: 1.85 L/sec
FEF 25-75 Pre: 3.03 L/sec
FEF2575-%Change-Post: -38 %
FEF2575-%Pred-Post: 70 %
FEF2575-%Pred-Pre: 115 %
FEV1-%Change-Post: -20 %
FEV1-%Pred-Post: 78 %
FEV1-%Pred-Pre: 98 %
FEV1-Post: 2.06 L
FEV1-Pre: 2.57 L
FEV1FVC-%Change-Post: -21 %
FEV1FVC-%Pred-Pre: 101 %
FEV6-%Change-Post: 2 %
FEV6-Post: 3.16 L
FEV6-Pre: 3.07 L
FVC-%Change-Post: 1 %
FVC-%Pred-Post: 97 %
FVC-%Pred-Pre: 96 %
FVC-Post: 3.16 L
FVC-Pre: 3.1 L
Post FEV1/FVC ratio: 65 %
Post FEV6/FVC ratio: 100 %
Pre FEV1/FVC ratio: 83 %
Pre FEV6/FVC Ratio: 100 %
RV % pred: 87 %
RV: 1.58 L
TLC % pred: 101 %
TLC: 5.21 L

## 2021-10-04 MED ORDER — BENZONATATE 200 MG PO CAPS: 200.0000 mg | ORAL_CAPSULE | Freq: Three times a day (TID) | ORAL | 3 refills | Status: DC | PRN

## 2021-10-04 NOTE — Assessment & Plan Note (Signed)
Component of reactive airways.  Pulmonary function testing shows no evidence of airflow obstruction or restriction.  May use albuterol inhaler as needed.

## 2021-10-04 NOTE — Progress Notes (Signed)
Full PFT Performed Today  

## 2021-10-04 NOTE — Patient Instructions (Addendum)
Chlorpheniramine '4mg'$  At bedtime as needed for drainage , throat clearin g.  Delsym 2 tsp Twice daily  for cough As needed   Tessalon Three times a day  for cough as needed  Continue on Protonix and Pepcid .  Continue on Neurontin Twice daily   Albuterol inhaler 1-2 puffs every 6hr as needed  Follow up with Dr. Melvyn Novas  in 3 months  and As needed   Please contact office for sooner follow up if symptoms do not improve or worsen or seek emergency care

## 2021-10-04 NOTE — Patient Instructions (Signed)
Full PFT Performed Today  

## 2021-10-04 NOTE — Progress Notes (Signed)
$'@Patient'S$  ID: Patricia Bauer, female    DOB: Mar 07, 1971, 51 y.o.   MRN: [id number]  Chief Complaint  Patient presents with   Follow-up    Referring provider: [name], MD  HPI: 51 year old female never smoker seen for pulmonary consult for chronic cough since December 2022  TEST/EVENTS :  Allergy profile 05/14/2021 >  Eos 0.0 /  IgE  36     CT chest May 04, 2021 showed clear lungs  10/04/2021 Follow up : Chronic cough  Patient presents for a 6-week follow-up.  Patient was seen for pulmonary consult January 2023 for a ongoing cough that began around December 2022.  CT chest January 2023 showed clear lungs.  IgE was at 36 a senna fields were 0.  Pertussis was negative.  Patient was treated with cough control with Neurontin steroid taper and GERD therapy.  Patient says her cough totally went away.  Until she got norovirus with severe nausea vomiting in April.  Cough started to slowly come back and was flaring at last visit.  Patient was recommended on cough control regimen with Delsym, Tessalon, chlor tabs. Of note patient had been started on blood pressure medicines with lisinopril for a couple months but then was changed to amlodipine. Since last visit patient does feel that her cough is much improved.  Not totally gone but definitely decreased.  She denies any hemoptysis, discolored mucus, chest pain, orthopnea.  She was set up for pulmonary function testing that was completed today that shows normal lung function with FEV1 at 98%, ratio 83, FVC 96%, no significant bronchodilator response, DLCO 77%.  Allergies  Allergen Reactions   Ibuprofen Swelling    Bilateral swelling of ankles and feet   Diflucan [Fluconazole] Other (See Comments)    Pelvic pain   Onion Other (See Comments)    migraines    Topamax [Topiramate] Other (See Comments)    Reaction:  Body aches     Omeprazole    Hydrocodone Anxiety, Palpitations and Other (See Comments)    Reaction:  Migraines      Immunization History  Administered Date(s) Administered   Influenza,inj,Quad PF,6+ Mos 01/02/2015, 01/29/2016, 01/17/2017, 01/10/2018, 01/17/2019, 01/01/2021   Influenza-Unspecified 12/31/2019   PFIZER(Purple Top)SARS-COV-2 Vaccination 07/19/2019, 08/12/2019    Past Medical History:  Diagnosis Date   Anemia    Anxiety    Colon polyp    Depression    GERD (gastroesophageal reflux disease)    Headache    migraines    History of hysterectomy    still has both ovaries and cervix   History of migraine headaches    History of ovarian cyst    Hypertension    PONV (postoperative nausea and vomiting)     Tobacco History: Social History   Tobacco Use  Smoking Status Never  Smokeless Tobacco Never   Counseling given: Not Answered   Outpatient Medications Prior to Visit  Medication Sig Dispense Refill   albuterol (VENTOLIN HFA) 108 (90 Base) MCG/ACT inhaler Inhale 1-2 puffs into the lungs every 6 (six) hours as needed. 8 g 2   ALPRAZolam (XANAX) 0.25 MG tablet TAKE 1 TABLET(0.25 MG) BY MOUTH DAILY AS NEEDED FOR ANXIETY OR SLEEP 30 tablet 2   amLODipine (NORVASC) 10 MG tablet Take 1 tablet (10 mg total) by mouth daily. 30 tablet 1   famotidine (PEPCID) 20 MG tablet SMARTSIG:1 Tablet(s) By Mouth Every Evening     gabapentin (NEURONTIN) 300 MG capsule Take 1 capsule (300 mg total) by  mouth 2 (two) times daily. 60 capsule 2   pantoprazole (PROTONIX) 40 MG tablet TAKE 1 TABLET(40 MG) BY MOUTH DAILY 30 TO 60 MINUTES BEFORE FIRST MEAL OF THE DAY 30 tablet 2   SUMAtriptan (IMITREX) 50 MG tablet TAKE 1 TABLET MAY REPEAT IN 2 HOURS IF HEADACHE PERSISTS OR RECURS. DON'T USE MORE THAN TWO IN 24HRS 90 tablet 2   traMADol (ULTRAM) 50 MG tablet TAKE 1 TABLET(50 MG) BY MOUTH EVERY 4 HOURS FOR UP TO 5 DAYS AS NEEDED FOR COUGH OR PAIN 30 tablet 0   venlafaxine XR (EFFEXOR XR) 150 MG 24 hr capsule Take 1 capsule (150 mg total) by mouth daily with breakfast. 30 capsule 5   venlafaxine XR (EFFEXOR  XR) 75 MG 24 hr capsule Take 1 capsule (75 mg total) by mouth daily with breakfast. 30 capsule 5   traMADol (ULTRAM) 50 MG tablet Take by mouth.     benzonatate (TESSALON) 200 MG capsule Take 1 capsule (200 mg total) by mouth 3 (three) times daily as needed for cough. 30 capsule 1   famotidine (PEPCID) 10 MG tablet Take 10 mg by mouth daily.     No facility-administered medications prior to visit.     Review of Systems:   Constitutional:   No  weight loss, night sweats,  Fevers, chills, fatigue, or  lassitude.  HEENT:   No headaches,  Difficulty swallowing,  Tooth/dental problems, or  Sore throat,                No sneezing, itching, ear ache,  +nasal congestion, post nasal drip,   CV:  No chest pain,  Orthopnea, PND, swelling in lower extremities, anasarca, dizziness, palpitations, syncope.   GI  No heartburn, indigestion, abdominal pain, nausea, vomiting, diarrhea, change in bowel habits, loss of appetite, bloody stools.   Resp:  No chest wall deformity  Skin: no rash or lesions.  GU: no dysuria, change in color of urine, no urgency or frequency.  No flank pain, no hematuria   MS:  No joint pain or swelling.  No decreased range of motion.  No back pain.    Physical Exam  BP 124/82 (BP Location: Left Arm, Cuff Size: Normal)   Pulse 95   Temp 98.1 F (36.7 C) (Oral)   LMP 09/07/2009   SpO2 97%   GEN: A/Ox3; pleasant , NAD, well nourished    HEENT:  [place]/AT, NOSE-clear, THROAT-clear, no lesions, no postnasal drip or exudate noted.   NECK:  Supple w/ fair ROM; no JVD; normal carotid impulses w/o bruits; no thyromegaly or nodules palpated; no lymphadenopathy.    RESP  Clear  P & A; w/o, wheezes/ rales/ or rhonchi. no accessory muscle use, no dullness to percussion  CARD:  RRR, no m/r/g, no peripheral edema, pulses intact, no cyanosis or clubbing.  GI:   Soft & nt; nml bowel sounds; no organomegaly or masses detected.   Musco: Warm bil, no deformities or joint swelling  noted.   Neuro: alert, no focal deficits noted.    Skin: Warm, no lesions or rashes    Lab Results:       ProBNP No results found for: "PROBNP"  Imaging: No results found.       Latest Ref Rng & Units 10/04/2021    3:00 PM  PFT Results  FVC-Pre L 3.10  P  FVC-Predicted Pre % 96  P  FVC-Post L 3.16  P  FVC-Predicted Post % 97  P  Pre FEV1/FVC % %  83  P  Post FEV1/FCV % % 65  P  FEV1-Pre L 2.57  P  FEV1-Predicted Pre % 98  P  FEV1-Post L 2.06  P  DLCO uncorrected ml/min/mmHg 19.25  P  DLCO UNC% % 77  P  DLCO corrected ml/min/mmHg 19.25  P  DLCO COR %Predicted % 77  P  DLVA Predicted % 97  P  TLC L 5.21  P  TLC % Predicted % 101  P  RV % Predicted % 87  P    P Preliminary result    No results found for: "NITRICOXIDE"      Assessment & Plan:   Upper airway cough syndrome Upper airway cough syndrome improved with treatment aimed at trigger prevention, GERD/chronic rhinitis control along with cough control. For now we will continue on Neurontin twice daily.  Pulmonary function testing shows no airflow obstruction or restriction.  The only minimally decreased diffusing capacity.  Previous CT chest earlier this year was clear. May use albuterol as needed.  Otherwise continue on cough control regimen  Plan  Patient Instructions  Chlorpheniramine '4mg'$  At bedtime as needed for drainage , throat clearin g.  Delsym 2 tsp Twice daily  for cough As needed   Tessalon Three times a day  for cough as needed  Continue on Protonix and Pepcid .  Continue on Neurontin Twice daily   Albuterol inhaler 1-2 puffs every 6hr as needed  Follow up with Dr. Melvyn Novas  in 3 months  and As needed   Please contact office for sooner follow up if symptoms do not improve or worsen or seek emergency care     '  Allergic rhinitis Trigger prevention.  Continue current regimen  Reactive airway disease Component of reactive airways.  Pulmonary function testing shows no evidence of  airflow obstruction or restriction.  May use albuterol inhaler as needed.     Rexene Edison, NP 10/04/2021

## 2021-10-04 NOTE — Assessment & Plan Note (Signed)
Trigger prevention.  Continue current regimen

## 2021-10-04 NOTE — Assessment & Plan Note (Signed)
Upper airway cough syndrome improved with treatment aimed at trigger prevention, GERD/chronic rhinitis control along with cough control. For now we will continue on Neurontin twice daily.  Pulmonary function testing shows no airflow obstruction or restriction.  The only minimally decreased diffusing capacity.  Previous CT chest earlier this year was clear. May use albuterol as needed.  Otherwise continue on cough control regimen  Plan  Patient Instructions  Chlorpheniramine '4mg'$  At bedtime as needed for drainage , throat clearin g.  Delsym 2 tsp Twice daily  for cough As needed   Tessalon Three times a day  for cough as needed  Continue on Protonix and Pepcid .  Continue on Neurontin Twice daily   Albuterol inhaler 1-2 puffs every 6hr as needed  Follow up with Dr. Melvyn Novas  in 3 months  and As needed   Please contact office for sooner follow up if symptoms do not improve or worsen or seek emergency care     '

## 2021-10-06 ENCOUNTER — Other Ambulatory Visit: Payer: Self-pay

## 2021-10-06 DIAGNOSIS — I10 Essential (primary) hypertension: Secondary | ICD-10-CM

## 2021-10-06 MED ORDER — AMLODIPINE BESYLATE 10 MG PO TABS: ORAL_TABLET | ORAL | 0 refills | Status: DC

## 2021-10-06 NOTE — Progress Notes (Signed)
Insurance requires 90 days

## 2021-10-12 ENCOUNTER — Encounter: Payer: Self-pay | Admitting: Family Medicine

## 2021-10-13 MED ORDER — TIRZEPATIDE 2.5 MG/0.5ML ~~LOC~~ SOAJ
2.5000 mg | SUBCUTANEOUS | 0 refills | Status: DC
Start: 2021-10-13 — End: 2021-11-05

## 2021-10-14 ENCOUNTER — Encounter: Payer: Self-pay | Admitting: Family Medicine

## 2021-10-25 ENCOUNTER — Encounter: Payer: Self-pay | Admitting: Obstetrics and Gynecology

## 2021-11-05 ENCOUNTER — Ambulatory Visit: Payer: 59 | Admitting: Family Medicine

## 2021-11-05 VITALS — BP 124/84 | HR 96 | Temp 96.9°F | Ht 64.5 in | Wt 235.1 lb

## 2021-11-05 DIAGNOSIS — I10 Essential (primary) hypertension: Secondary | ICD-10-CM | POA: Diagnosis not present

## 2021-11-05 DIAGNOSIS — G43809 Other migraine, not intractable, without status migrainosus: Secondary | ICD-10-CM

## 2021-11-05 MED ORDER — TIRZEPATIDE 2.5 MG/0.5ML ~~LOC~~ SOAJ: 2.5000 mg | SUBCUTANEOUS | 0 refills | Status: DC

## 2021-11-05 MED ORDER — SUMATRIPTAN SUCCINATE 50 MG PO TABS: ORAL_TABLET | ORAL | 2 refills | Status: AC

## 2021-11-05 NOTE — Patient Instructions (Addendum)
Swelling - try compression socks "graduated" - might be amlodipine - if recurring we can switch to HCTZ  #Mounjaro - continue current dose - update in 3-4 weeks - we can increase the dose    New Primary Care provider options  [facility] - both fantastic Nurse Practitioners  Eugenia Pancoast, NP Romilda Garret, DNP  Physicians that are accepting new patients at other [facility] offices   Dr. Berniece Pap      [place] [facility] Dr. Esther Hardy      [place] [facility] [address] [place], [place], [place] [address]  [phone]  Dr. Lyndee Leo [address] [place], [place] [address]  [phone]  Dr. Loralyn Freshwater      [place] [address] [place], [place], [place] [address]  [phone]  Dr. Carollee Leitz  [facility]  [address] [place], [place] [address]  [phone]

## 2021-11-05 NOTE — Assessment & Plan Note (Signed)
Stable, refill sumatriptan 50 mg as needed.

## 2021-11-05 NOTE — Assessment & Plan Note (Signed)
Controlled.  Have some swelling suspect this might be secondary to amlodipine, but this is resolved.  As we have had difficulty controlling her blood pressure we will continue amlodipine 10 mg and monitoring for return of swelling.  She was advised to try compression socks and update if it is bothersome.  Consider hydrochlorothiazide if needed.

## 2021-11-05 NOTE — Progress Notes (Signed)
Subjective:     Patricia Bauer is a 51 y.o. female presenting for Edema (Not happening today) and Medication Refill (mounjaro)     HPI  #Obesity - has noticed a decrease in appetite - some nausea - for the first few days  - will continue current dose  #swelling - ankle and leg swelling - when she messaged was just the right foot/ankle - no known association - had some stiffness - no injury related to this - elevated the foot with improvement -    Review of Systems   Social History   Tobacco Use  Smoking Status Never  Smokeless Tobacco Never        Objective:    BP Readings from Last 3 Encounters:  11/05/21 124/84  10/04/21 124/82  09/10/21 130/90   Wt Readings from Last 3 Encounters:  11/05/21 235 lb 2 oz (106.7 kg)  09/10/21 234 lb (106.1 kg)  08/20/21 236 lb 6.4 oz (107.2 kg)    BP 124/84   Pulse 96   Temp (!) 96.9 F (36.1 C) (Temporal)   Ht 5' 4.5" (1.638 m)   Wt 235 lb 2 oz (106.7 kg)   LMP 09/07/2009   SpO2 96%   BMI 39.74 kg/m    Physical Exam Constitutional:      General: She is not in acute distress.    Appearance: She is well-developed. She is not diaphoretic.  HENT:     Right Ear: External ear normal.     Left Ear: External ear normal.     Nose: Nose normal.  Eyes:     Conjunctiva/sclera: Conjunctivae normal.  Cardiovascular:     Rate and Rhythm: Normal rate.  Pulmonary:     Effort: Pulmonary effort is normal.  Musculoskeletal:     Cervical back: Neck supple.     Right lower leg: No edema.     Left lower leg: No edema.  Skin:    General: Skin is warm and dry.     Capillary Refill: Capillary refill takes less than 2 seconds.  Neurological:     Mental Status: She is alert. Mental status is at baseline.  Psychiatric:        Mood and Affect: Mood normal.        Behavior: Behavior normal.           Assessment & Plan:   Problem List Items Addressed This Visit       Cardiovascular and Mediastinum    Migraine headache    Stable, refill sumatriptan 50 mg as needed.      Relevant Medications   SUMAtriptan (IMITREX) 50 MG tablet   Hypertension - Primary    Controlled.  Have some swelling suspect this might be secondary to amlodipine, but this is resolved.  As we have had difficulty controlling her blood pressure we will continue amlodipine 10 mg and monitoring for return of swelling.  She was advised to try compression socks and update if it is bothersome.  Consider hydrochlorothiazide if needed.        Other   Morbid obesity ([place])    Complicated by hypertension now controlled.  She is having some nausea on Mounjaro 2.5 mg, she has not lost any weight in 1 month.  At this time we will continue the 2.5 mg dose she will update in 1 month.  Still no weight loss discussed that it would likely be recommended to either increase dose or stop treatment if continued to have side effects.  Relevant Medications   tirzepatide [facility]) 2.5 MG/0.5ML Pen     Return in about 3 months (around 02/05/2022) for annual.  [name], MD

## 2021-11-05 NOTE — Assessment & Plan Note (Signed)
Complicated by hypertension now controlled.  She is having some nausea on Mounjaro 2.5 mg, she has not lost any weight in 1 month.  At this time we will continue the 2.5 mg dose she will update in 1 month.  Still no weight loss discussed that it would likely be recommended to either increase dose or stop treatment if continued to have side effects.

## 2021-11-12 ENCOUNTER — Encounter: Payer: Self-pay | Admitting: Adult Health

## 2021-11-12 ENCOUNTER — Ambulatory Visit: Payer: 59 | Admitting: Adult Health

## 2021-11-12 DIAGNOSIS — F331 Major depressive disorder, recurrent, moderate: Secondary | ICD-10-CM | POA: Diagnosis not present

## 2021-11-12 DIAGNOSIS — F422 Mixed obsessional thoughts and acts: Secondary | ICD-10-CM

## 2021-11-12 DIAGNOSIS — F4000 Agoraphobia, unspecified: Secondary | ICD-10-CM | POA: Diagnosis not present

## 2021-11-12 DIAGNOSIS — F411 Generalized anxiety disorder: Secondary | ICD-10-CM | POA: Diagnosis not present

## 2021-11-12 DIAGNOSIS — F410 Panic disorder [episodic paroxysmal anxiety] without agoraphobia: Secondary | ICD-10-CM

## 2021-11-12 MED ORDER — VENLAFAXINE HCL ER 75 MG PO CP24: 75.0000 mg | ORAL_CAPSULE | Freq: Every day | ORAL | 5 refills | Status: DC

## 2021-11-12 MED ORDER — VENLAFAXINE HCL ER 150 MG PO CP24: 150.0000 mg | ORAL_CAPSULE | Freq: Every day | ORAL | 5 refills | Status: DC

## 2021-11-12 NOTE — Progress Notes (Signed)
Patricia Bauer [id number] May 22, 1970 51 y.o.  Subjective:   Patient ID:  Patricia Bauer is a 51 y.o. (DOB May 01, 1970) female.  Chief Complaint: No chief complaint on file.   HPI Patricia Bauer presents to the office today for follow-up of MDD, GAD, insomnia, panic attacks, and agoraphobia,   Describes mood today as "ok". Pleasant. Tearful at times. Mood symptoms - reports decreased depression, irritability and anxiety. Reports increased worry and rumination. Reports a "few"panic attacks. Decreased skin picking - "depends on the amount of stress". Feels like she has moved past recent break up. She and daughter getting along better - staying together for now. Feels like medications are working well. Varying interest and motivation. Taking medications as prescribed.  Energy levels lower - always tired. Active, does not have a regular exercise routine. Enjoys some usual interests and activities. Single. Divorced. Daughter (40) 2 children - 4 and 24 months. Spending time with family and friends. Appetite adequate. Reporting weight loss  - started on Mounjaro. Sleep has gotten better. Averages 7 to 8 hours. Focus and concentration "up and down". Completing tasks. Managing aspects of household. Works full time for [employer].  Denies SI or HI.  Denies AH or VH. Denies self harm. Therapist - Marya Fossa - x 2 years.  Diagnosed with Fibromylagia and RA. Diagnosed with tachyphylaxis.  Previous medication trials:  Celexa, Zoloft - flat, Wellbutrin   GAD-7    Flowsheet Row Office Visit from 02/23/2021 in [facility] Visit from 01/21/2021 in [place] at [facility] Visit from 10/10/2019 in [place] at [facility] Visit from 03/28/2019 in [place]  Total GAD-7 Score '5 6 10 7      '$ PHQ2-9    [place] Visit from 02/23/2021 in [facility] Visit from 01/21/2021 in [place]  at [facility] Visit from 12/31/2019 in [place] at [facility] Visit from 10/10/2019 in [place] at [facility] Visit from 03/28/2019 in [place]  PHQ-2 Total Score '2 2 2 '$ 0 0  PHQ-9 Total Score '15 14 8 8 7      '$ Flowsheet Row ED from 06/22/2021 in [place] No Risk        Review of Systems:  Review of Systems  Musculoskeletal:  Negative for gait problem.  Neurological:  Negative for tremors.  Psychiatric/Behavioral:         Please refer to HPI    Medications: Scheduled:  Current Outpatient Medications  Medication Sig Dispense Refill   albuterol (VENTOLIN HFA) 108 (90 Base) MCG/ACT inhaler Inhale 1-2 puffs into the lungs every 6 (six) hours as needed. 8 g 2   ALPRAZolam (XANAX) 0.25 MG tablet TAKE 1 TABLET(0.25 MG) BY MOUTH DAILY AS NEEDED FOR ANXIETY OR SLEEP 30 tablet 2   amLODipine (NORVASC) 10 MG tablet TAKE 1 TABLET(10 MG) BY MOUTH DAILY 90 tablet 0   benzonatate (TESSALON) 200 MG capsule Take 1 capsule (200 mg total) by mouth 3 (three) times daily as needed for cough. 45 capsule 3   famotidine (PEPCID) 20 MG tablet SMARTSIG:1 Tablet(s) By Mouth Every Evening     gabapentin (NEURONTIN) 300 MG capsule Take 1 capsule (300 mg total) by mouth 2 (two) times daily. 60 capsule 2   pantoprazole (PROTONIX) 40 MG tablet TAKE 1 TABLET(40 MG) BY MOUTH DAILY 30 TO 60 MINUTES BEFORE FIRST MEAL  OF THE DAY 30 tablet 2   SUMAtriptan (IMITREX) 50 MG tablet TAKE 1 TABLET MAY REPEAT IN 2 HOURS IF HEADACHE PERSISTS OR RECURS. DON'T USE MORE THAN TWO IN 24HRS 10 tablet 2   tirzepatide (MOUNJARO) 2.5 MG/0.5ML Pen Inject 2.5 mg into the skin once a week. 2 mL 0   traMADol (ULTRAM) 50 MG tablet TAKE 1 TABLET(50 MG) BY MOUTH EVERY 4 HOURS FOR UP TO 5 DAYS AS NEEDED FOR COUGH OR PAIN 30 tablet 0   venlafaxine XR (EFFEXOR XR) 150 MG 24 hr capsule Take 1 capsule (150 mg total)  by mouth daily with breakfast. 30 capsule 5   venlafaxine XR (EFFEXOR XR) 75 MG 24 hr capsule Take 1 capsule (75 mg total) by mouth daily with breakfast. 30 capsule 5   No current facility-administered medications for this visit.    Medication Side Effects: None  Allergies:  Allergies  Allergen Reactions   Ibuprofen Swelling    Bilateral swelling of ankles and feet   Diflucan [Fluconazole] Other (See Comments)    Pelvic pain   Onion Other (See Comments)    migraines    Topamax [Topiramate] Other (See Comments)    Reaction:  Body aches     Omeprazole    Hydrocodone Anxiety, Palpitations and Other (See Comments)    Reaction:  Migraines     Past Medical History:  Diagnosis Date   Anemia    Anxiety    Colon polyp    Depression    GERD (gastroesophageal reflux disease)    Headache    migraines    History of hysterectomy    still has both ovaries and cervix   History of migraine headaches    History of ovarian cyst    Hypertension    PONV (postoperative nausea and vomiting)     Past Medical History, Surgical history, Social history, and Family history were reviewed and updated as appropriate.   Please see review of systems for further details on the patient's review from today.   Objective:   Physical Exam:  LMP 09/07/2009   Physical Exam Constitutional:      General: She is not in acute distress. Musculoskeletal:        General: No deformity.  Neurological:     Mental Status: She is alert and oriented to person, place, and time.     Coordination: Coordination normal.  Psychiatric:        Attention and Perception: Attention and perception normal. She does not perceive auditory or visual hallucinations.        Mood and Affect: Mood normal. Mood is not anxious or depressed. Affect is not labile, blunt, angry or inappropriate.        Speech: Speech normal.        Behavior: Behavior normal.        Thought Content: Thought content normal. Thought content is not  paranoid or delusional. Thought content does not include homicidal or suicidal ideation. Thought content does not include homicidal or suicidal plan.        Cognition and Memory: Cognition and memory normal.        Judgment: Judgment normal.     Comments: Insight intact     Lab Review:     Component Value Date/Time   NA 138 07/01/2021 1305   NA 139 03/13/2013 2022   K 4.6 07/01/2021 1305   K 3.6 03/13/2013 2022   CL 100 07/01/2021 1305   CL 106 03/13/2013 2022  CO2 20 07/01/2021 1305   CO2 26 03/13/2013 2022   GLUCOSE 93 07/01/2021 1305   GLUCOSE 110 (H) 05/04/2020 0503   GLUCOSE 94 03/13/2013 2022   BUN 11 07/01/2021 1305   BUN 11 03/13/2013 2022   CREATININE 0.85 07/01/2021 1305   CREATININE 1.05 (H) 05/24/2019 1359   CREATININE 0.80 03/13/2013 2022   CALCIUM 9.4 07/01/2021 1305   CALCIUM 8.7 03/13/2013 2022   PROT 6.7 07/01/2021 1305   ALBUMIN 4.1 07/01/2021 1305   AST 33 07/01/2021 1305   AST 27 05/24/2019 1359   ALT 56 (H) 07/01/2021 1305   ALT 32 05/24/2019 1359   ALKPHOS 101 07/01/2021 1305   BILITOT <0.2 07/01/2021 1305   BILITOT 0.3 05/24/2019 1359   GFRNONAA >60 05/04/2020 0503   GFRNONAA >60 05/24/2019 1359   GFRNONAA >60 03/13/2013 2022   GFRAA 89 12/31/2019 1451   GFRAA >60 05/24/2019 1359   GFRAA >60 03/13/2013 2022       Component Value Date/Time   WBC 11.0 (H) 06/11/2021 1331   WBC 8.1 05/14/2021 1429   RBC 4.55 06/11/2021 1332   RBC 4.59 06/11/2021 1331   HGB 14.5 06/11/2021 1331   HGB 11.9 01/17/2019 1519   HGB 12.0 01/17/2019 1519   HCT 43.4 06/11/2021 1331   HCT 36.1 01/17/2019 1519   HCT 36.5 01/17/2019 1519   PLT 289 06/11/2021 1331   PLT 333 01/17/2019 1519   PLT 338 01/17/2019 1519   MCV 94.6 06/11/2021 1331   MCV 82 01/17/2019 1519   MCV 83 01/17/2019 1519   MCV 85 03/13/2013 2022   MCH 31.6 06/11/2021 1331   MCHC 33.4 06/11/2021 1331   RDW 12.1 06/11/2021 1331   RDW 14.1 01/17/2019 1519   RDW 14.2 01/17/2019 1519   RDW  13.4 03/13/2013 2022   LYMPHSABS 2.4 06/11/2021 1331   LYMPHSABS 2.8 01/17/2019 1519   LYMPHSABS 3.0 01/17/2019 1519   MONOABS 0.6 06/11/2021 1331   EOSABS 0.1 06/11/2021 1331   EOSABS 0.2 01/17/2019 1519   EOSABS 0.2 01/17/2019 1519   BASOSABS 0.1 06/11/2021 1331   BASOSABS 0.0 01/17/2019 1519   BASOSABS 0.0 01/17/2019 1519    No results found for: "POCLITH", "LITHIUM"   No results found for: "PHENYTOIN", "PHENOBARB", "VALPROATE", "CBMZ"   .res Assessment: Plan:    Plan:  PDMP reviewed  Effexor XR 150 and '75mg'$  every morning Xanax 0.'25mg'$  as needed - takes once a week  RTC 4 months  Patient advised to contact office with any questions, adverse effects, or acute worsening in signs and symptoms.  Discussed potential benefits, risk, and side effects of benzodiazepines to include potential risk of tolerance and dependence, as well as possible drowsiness.  Advised patient not to drive if experiencing drowsiness and to take lowest possible effective dose to minimize risk of dependence and tolerance.   There are no diagnoses linked to this encounter.   Please see After Visit Summary for patient specific instructions.  Future Appointments  Date Time Provider [place]  01/04/2022  4:00 PM Tanda Rockers, MD [facility] None  01/21/2022  8:00 AM [name], MD [facility]  06/10/2022  1:30 PM [facility] None  06/10/2022  1:45 PM Celso Amy, NP [facility] None    No orders of the defined types were placed in this encounter.   -------------------------------

## 2021-11-30 ENCOUNTER — Other Ambulatory Visit: Payer: Self-pay

## 2021-11-30 DIAGNOSIS — R058 Other specified cough: Secondary | ICD-10-CM

## 2021-11-30 MED ORDER — PANTOPRAZOLE SODIUM 40 MG PO TBEC: DELAYED_RELEASE_TABLET | ORAL | 2 refills | Status: DC

## 2021-12-11 ENCOUNTER — Other Ambulatory Visit: Payer: Self-pay | Admitting: Family Medicine

## 2021-12-13 NOTE — Telephone Encounter (Signed)
Mychart sent.

## 2021-12-15 MED ORDER — TIRZEPATIDE 5 MG/0.5ML ~~LOC~~ SOAJ: 5.0000 mg | SUBCUTANEOUS | 0 refills | Status: DC

## 2021-12-15 NOTE — Addendum Note (Signed)
Addended by: [name] on: 12/15/2021 09:28 AM   Modules accepted: Orders

## 2021-12-23 ENCOUNTER — Encounter: Payer: Self-pay | Admitting: Family Medicine

## 2022-01-04 ENCOUNTER — Encounter: Payer: Self-pay | Admitting: Internal Medicine

## 2022-01-04 ENCOUNTER — Telehealth (INDEPENDENT_AMBULATORY_CARE_PROVIDER_SITE_OTHER): Payer: 59 | Admitting: Internal Medicine

## 2022-01-04 DIAGNOSIS — R058 Other specified cough: Secondary | ICD-10-CM | POA: Diagnosis not present

## 2022-01-04 NOTE — Progress Notes (Signed)
Patricia Bauer, female    DOB: 08-02-1970,   MRN: [id number]   Brief patient profile:  6  yowf  never smoker/ never any resp problems referred to pulmonary clinic in [facility]  05/14/2021 by Patricia Bauer  for cough and wheeze in setting of viral URI  just prior to xmas 2022 and much by [place] with multiple sick contacts including childred in the home.     History of Present Illness  05/14/2021  Pulmonary/ 1st office eval/ Patricia Bauer / [employer]  Chief Complaint  Patient presents with   Consult  Dyspnea:  even if not cough feels more doe - can't do grocery shopping  Cough: worse before supper and worse as evening goes on, some better at hs but that starts back up during the night Rx augmentin then levaquin  Sleep: bed is flat, usual pillows  SABA use: none / cough is no better with flovent  Rec  First take Gabapentin 300 mg  4 x daily and supplement if needed with  tramadol 50 mg up to 2 every 4 hours to suppress the urge to cough  Once you have eliminated the cough for 3 straight days try reducing the tramadol  Protonix (pantoprazole) Take 30-60 min before first meal of the day and Pepcid 20 mg one bedtime plus chlorpheniramine 4 mg x 2 at bedtime (both available over the counter)  until cough is completely gone for at least a week without the need for cough suppression GERD diet Labs Eos 0.0 /  IgE 36         Norovirus caused vomiting which caused the cough to come back but no change in recs  and cough cleared s additional rx     01/04/2022  virtual  ov/Patricia Bauer re: chronic cough  maint on protonix q am /no pepcid/chlorpheniramine Gabapentin 300 mg twice  daily    Virtual Visit via Telephone Note 01/04/2022   I connected with Patricia Bauer on 01/04/22 at  4:00 PM EDT by MyChart video/ audio and verified that I am speaking with the correct person using two identifiers. Pt is at home and this call made from my office with no other participants    I discussed the limitations,  risks, security and privacy concerns of performing an evaluation and management service by telephone and the availability of in person appointments. I also discussed with the patient that there may be a patient responsible charge related to this service. The patient expressed understanding and agreed to proceed.    Chief Complaint  Patient presents with   Follow-up    No new issues or concerns since LOV.  Dyspnea:  not limiting by breathing  Cough: not now  Sleeping: bed flat/ 2 pillows SABA use: none  02: none  Covid status: vax x 3 / never infected    No obvious day to day or daytime variability or assoc excess/ purulent sputum or mucus plugs or hemoptysis or cp or chest tightness, subjective wheeze or overt sinus or hb symptoms.   Sleeping as above  without nocturnal  or early am exacerbation  of respiratory  c/o's or need for noct saba. Also denies any obvious fluctuation of symptoms with weather or environmental changes or other aggravating or alleviating factors except as outlined above   No unusual exposure hx or h/o childhood pna/ asthma or knowledge of premature birth.  Current Allergies, Complete Past Medical History, Past Surgical History, Family History, and Social History were reviewed in [facility] electronic medical record.  ROS  The following are not active complaints unless bolded Hoarseness, sore throat, dysphagia, dental problems, itching, sneezing,  nasal congestion or discharge of excess mucus or purulent secretions, ear ache,   fever, chills, sweats, unintended wt loss or wt gain, classically pleuritic or exertional cp,  orthopnea pnd or arm/hand swelling  or leg swelling, presyncope, palpitations, abdominal pain, anorexia, nausea, vomiting, diarrhea  or change in bowel habits or change in bladder habits, change in stools or change in urine, dysuria, hematuria,  rash, arthralgias, visual complaints, headache, numbness, weakness or ataxia or problems with walking or  coordination,  change in mood or  memory.        Current Meds  Medication Sig   albuterol (VENTOLIN HFA) 108 (90 Base) MCG/ACT inhaler Inhale 1-2 puffs into the lungs every 6 (six) hours as needed.   ALPRAZolam (XANAX) 0.25 MG tablet TAKE 1 TABLET(0.25 MG) BY MOUTH DAILY AS NEEDED FOR ANXIETY OR SLEEP   amLODipine (NORVASC) 10 MG tablet TAKE 1 TABLET(10 MG) BY MOUTH DAILY   famotidine (PEPCID) 20 MG tablet SMARTSIG:1 Tablet(s) By Mouth Every Evening   gabapentin (NEURONTIN) 300 MG capsule Take 1 capsule (300 mg total) by mouth 2 (two) times daily.   pantoprazole (PROTONIX) 40 MG tablet TAKE 1 TABLET(40 MG) BY MOUTH DAILY 30 TO 60 MINUTES BEFORE FIRST MEAL OF THE DAY   SUMAtriptan (IMITREX) 50 MG tablet TAKE 1 TABLET MAY REPEAT IN 2 HOURS IF HEADACHE PERSISTS OR RECURS. DON'T USE MORE THAN TWO IN 24HRS   tirzepatide [facility]) 5 MG/0.5ML Pen Inject 5 mg into the skin once a week.   traMADol (ULTRAM) 50 MG tablet TAKE 1 TABLET(50 MG) BY MOUTH EVERY 4 HOURS FOR UP TO 5 DAYS AS NEEDED FOR COUGH OR PAIN   venlafaxine XR (EFFEXOR XR) 150 MG 24 hr capsule Take 1 capsule (150 mg total) by mouth daily with breakfast.   venlafaxine XR (EFFEXOR XR) 75 MG 24 hr capsule Take 1 capsule (75 mg total) by mouth daily with breakfast.              Past Medical History:  Diagnosis Date   Anemia    Anxiety    Colon polyp    Depression    GERD (gastroesophageal reflux disease)    Headache    migraines    History of hysterectomy    still has both ovaries and cervix   History of migraine headaches    History of ovarian cyst    PONV (postoperative nausea and vomiting)          Objective:     Wt Readings from Last 3 Encounters:  11/05/21 235 lb 2 oz (106.7 kg)  09/10/21 234 lb (106.1 kg)  08/20/21 236 lb 6.4 oz (107.2 kg)        General appearance:    pleasant female nad / good voice texture, no conversational sob          Assessment

## 2022-01-04 NOTE — Patient Instructions (Signed)
To wean off the protonix :  pepcid 20 mg after bfast and supper and then just take the one after breakfast for a week  and stop.  Ok to substitute prilosec otc for the protonix for mild heartburn   At the first sign of a cough I would:  The key to effective treatment for your cough is eliminating the non-stop cycle of cough you're stuck in long enough to let your airway heal completely and then see if there is anything still making you cough once you stop the cough suppression, but this should take no more than 5 days to figure out  First take Gabapentin 300 mg  4 x daily and supplement if needed with delysm 2 tsp every 12 hours as needed   Protonix (pantoprazole) Take 30-60 min before first meal of the day and Pepcid 20 mg one bedtime plus chlorpheniramine 4 mg x 2 at bedtime (both available over the counter)  until cough is completely gone for at least a week without the need for cough suppression  GERD (REFLUX)  is an extremely common cause of respiratory symptoms, many times with no significant heartburn at all.    It can be treated with medication, but also with lifestyle changes including avoidance of late meals, excessive alcohol, smoking cessation, and avoid fatty foods, chocolate, peppermint, colas, red wine, and acidic juices such as orange juice.  NO MINT OR MENTHOL PRODUCTS SO NO COUGH DROPS  USE HARD CANDY INSTEAD (jolley ranchers or Stover's or Lifesavers (all available in sugarless versions) NO OIL BASED VITAMINS - use powdered substitutes.  Please remember to go to the lab department   for your tests - we will call you with the results when they are available.      Call if not 100% better in 2 weeks

## 2022-01-04 NOTE — Assessment & Plan Note (Addendum)
Onset with URI before xmas of 2022 / severe to point of gag - Pertussis AB 05/14/2021 >>> neg IgM  slt elevation IgG c/w prior infection or immunization - Allergy profile 05/14/2021 >  Eos 0.0 /  IgE 36   - cyclical cough rx with tramadol and increase gabapentin from 300 bid to qid > resolved to her satisfaction - 01/04/2022 back on gabapentin 300 mg bid with good cough control > try to wean off ppi and then pepcid   Discussed the recent press about ppi's in the context of a statistically significant (but questionably clinically relevant) increase in CRI in pts on ppi vs h2's > bottom line is the lowest dose of ppi that controls   gerd is the right dose and if that dose is zero that's fine esp since h2's are cheaper.   see avs for instructions unique to this ov   Pulmonary f/u can be as needed          Each maintenance medication was reviewed in detail including emphasizing most importantly the difference between maintenance and prns and under what circumstances the prns are to be triggered using an action plan format where appropriate.  Total time for H and P, chart review, counseling, reviewing  and generating customized AVS unique to this office visit / same day charting =  15 min

## 2022-01-07 ENCOUNTER — Encounter: Payer: Self-pay | Admitting: Family Medicine

## 2022-01-07 MED ORDER — TIRZEPATIDE 7.5 MG/0.5ML ~~LOC~~ SOAJ: 7.5000 mg | SUBCUTANEOUS | 0 refills | Status: DC

## 2022-01-07 NOTE — Addendum Note (Signed)
Addended by: [name] on: 01/07/2022 01:25 PM   Modules accepted: Orders

## 2022-01-13 ENCOUNTER — Other Ambulatory Visit: Payer: Self-pay | Admitting: Family Medicine

## 2022-01-13 DIAGNOSIS — I10 Essential (primary) hypertension: Secondary | ICD-10-CM

## 2022-01-13 MED ORDER — AMLODIPINE BESYLATE 10 MG PO TABS: ORAL_TABLET | ORAL | 0 refills | Status: DC

## 2022-01-21 ENCOUNTER — Ambulatory Visit (INDEPENDENT_AMBULATORY_CARE_PROVIDER_SITE_OTHER): Payer: 59 | Admitting: Family Medicine

## 2022-01-21 VITALS — BP 120/78 | HR 92 | Temp 96.1°F | Ht 64.5 in | Wt 211.5 lb

## 2022-01-21 DIAGNOSIS — Z0000 Encounter for general adult medical examination without abnormal findings: Secondary | ICD-10-CM | POA: Diagnosis not present

## 2022-01-21 DIAGNOSIS — N950 Postmenopausal bleeding: Secondary | ICD-10-CM | POA: Diagnosis not present

## 2022-01-21 DIAGNOSIS — R7303 Prediabetes: Secondary | ICD-10-CM

## 2022-01-21 DIAGNOSIS — Z23 Encounter for immunization: Secondary | ICD-10-CM | POA: Diagnosis not present

## 2022-01-21 DIAGNOSIS — E782 Mixed hyperlipidemia: Secondary | ICD-10-CM

## 2022-01-21 MED ORDER — TIRZEPATIDE 5 MG/0.5ML ~~LOC~~ SOAJ: 5.0000 mg | SUBCUTANEOUS | 1 refills | Status: DC

## 2022-01-21 NOTE — Assessment & Plan Note (Signed)
C/b HTN, HLD, depression. Losing weight with mounjaro but severe nausea. Decrease dose to 5 mg and reassess.

## 2022-01-21 NOTE — Patient Instructions (Addendum)
Decrease mounjaro to 5 mg Return in 1 month to meet Tabitha  Would recommend the following for Bone Health:   1) 800 units of Vitamin D daily 2) Get 1200 mg of elemental calcium --- this is best from your diet. Try to track how much calcium you get on a typical day. You could find ways to add more (dairy products, leafy greens). Take a supplement for whatever you don't typically get so you reach 1200 mg of calcium.  3) Physical activity (ideally weight bearing) - like walking briskly 30 minutes 5 days a week.    OB/GYN Address: [address], [place], [place] [address] Hours:  Open ? Closes 12?PM Phone: [phone]

## 2022-01-21 NOTE — Progress Notes (Signed)
Annual Exam   Chief Complaint:  Chief Complaint  Patient presents with   Annual Exam   Menorrhagia    Last 3 months, despite having a partial hysterectomy     History of Present Illness:  Patricia Bauer is a 51 y.o. [phone] who LMP was Patient's last menstrual period was 09/07/2009., presents today for her annual examination.    #weight loss - bad nausea with mounjaro - which lasts for 3-4 days - feels she is able to eat "a little"  Nutrition She does not get adequate calcium and Vitamin D in her diet. Diet: trying to do low calorie Exercise: trying to restart this    Social History   Tobacco Use  Smoking Status Never  Smokeless Tobacco Never   Social History   Substance and Sexual Activity  Alcohol Use Not Currently   Comment: occassional- once a month has about 2-3 drinks   Social History   Substance and Sexual Activity  Drug Use No     General Health Dentist in the last year: Yes Eye doctor: due  Safety The patient wears seatbelts: yes.     The patient feels safe at home and in their relationships: yes.   Menstrual:  Symptoms of menopause: blood work showed - menopause S/p hysterectomy  Vaginal discharge No blood in urine or stool Filling a panty liner Has bleeding for 3-4 days   GYN She is not sexually active.    Cervical Cancer Screening (21-65):   Last Pap:   November 2022 Results were: atypical squamous cellularity of undetermined significance (ASCUS) /neg HPV DNA   Breast Cancer Screening (Age 42-74):  There is no FH of breast cancer. There is no FH of ovarian cancer. BRCA screening Not Indicated.  Last Mammogram: 07/2021 The patient does want a mammogram this year.    Colon Cancer Screening:  Age 25-75 yo - benefits outweigh the risk. Adults 76-85 yo who have never been screened benefit.  Benefits: 134000 people in 2016 will be diagnosed and 49,000 will die - early detection helps [place]: Complications 2/2 to  colonoscopy High Risk (Colonoscopy): genetic disorder (Lynch syndrome or familial adenomatous polyposis), personal hx of IBD, previous adenomatous polyp, or previous colorectal cancer, FamHx start 10 years before the age at diagnosis, increased in males and black race  Options:  FIT - looks for hemoglobin (blood in the stool) - specific and fairly sensitive - must be done annually Cologuard - looks for DNA and blood - more sensitive - therefore can have more false positives, every 3 years Colonoscopy - every 10 years if normal - sedation, bowl prep, must have someone drive you  Shared decision making and the patient had decided to do colonoscopy 2024.   Social History   Tobacco Use  Smoking Status Never  Smokeless Tobacco Never    Lung Cancer Screening (Ages 33-43): not applicable   Weight Wt Readings from Last 3 Encounters:  01/21/22 211 lb 8 oz (95.9 kg)  11/05/21 235 lb 2 oz (106.7 kg)  09/10/21 234 lb (106.1 kg)   Patient has high BMI  BMI Readings from Last 1 Encounters:  01/21/22 35.74 kg/m     Chronic disease screening Blood pressure monitoring:  BP Readings from Last 3 Encounters:  01/21/22 120/78  11/05/21 124/84  10/04/21 124/82    Lipid Monitoring: Indication for screening: age >45, obesity, diabetes, family hx, CV risk factors.  Lipid screening: Yes  Lab Results  Component Value Date   CHOL 230 (  H) 01/21/2021   HDL 52 01/21/2021   LDLCALC 144 (H) 01/21/2021   TRIG 191 (H) 01/21/2021   CHOLHDL 4.4 01/21/2021     Diabetes Screening: age >9, overweight, family hx, PCOS, hx of gestational diabetes, at risk ethnicity Diabetes Screening screening: Yes  Lab Results  Component Value Date   HGBA1C 6.0 (H) 01/21/2021     Past Medical History:  Diagnosis Date   Anemia    Anxiety    Colon polyp    Depression    GERD (gastroesophageal reflux disease)    Headache    migraines    History of hysterectomy    still has both ovaries and cervix    History of migraine headaches    History of ovarian cyst    Hypertension    PONV (postoperative nausea and vomiting)     Past Surgical History:  Procedure Laterality Date   COLONOSCOPY     KNEE ARTHROPLASTY     KNEE ARTHROSCOPY     knee athroscopy  08/2010   LT   LAPAROSCOPIC SUPRACERVICAL HYSTERECTOMY     LAPAROSCOPIC VAGINAL HYSTERECTOMY  08/2009   Supra cervical   left partial knee replacement     PARTIAL KNEE ARTHROPLASTY Left 02/10/2014   Procedure: LEFT MEDIAL UNICOMPARTMENTAL KNEE ARTHROPLASTY;  Surgeon: Gearlean Alf, MD;  Location: [facility];  Service: Orthopedics;  Laterality: Left;   UPPER GI ENDOSCOPY     WISDOM TOOTH EXTRACTION  1995    Prior to Admission medications   Medication Sig Start Date End Date Taking? Authorizing Provider  ALPRAZolam (XANAX) 0.25 MG tablet TAKE 1 TABLET(0.25 MG) BY MOUTH DAILY AS NEEDED FOR ANXIETY OR SLEEP 09/10/21  Yes Mozingo, Berdie Ogren, NP  amLODipine (NORVASC) 10 MG tablet TAKE 1 TABLET(10 MG) BY MOUTH DAILY 01/13/22  Yes [name], MD  famotidine (PEPCID) 20 MG tablet SMARTSIG:1 Tablet(s) By Mouth Every Evening 08/09/21  Yes [provider]  gabapentin (NEURONTIN) 300 MG capsule Take 1 capsule (300 mg total) by mouth 2 (two) times daily. 08/23/21  Yes Kasa, Maretta Bees, MD  pantoprazole (PROTONIX) 40 MG tablet TAKE 1 TABLET(40 MG) BY MOUTH DAILY 30 TO 60 MINUTES BEFORE FIRST MEAL OF THE DAY 11/30/21  Yes Tanda Rockers, MD  SUMAtriptan (IMITREX) 50 MG tablet TAKE 1 TABLET MAY REPEAT IN 2 HOURS IF HEADACHE PERSISTS OR RECURS. DON'T USE MORE THAN TWO IN 24HRS 11/05/21  Yes [name], MD  tirzepatide [facility]) 7.5 MG/0.5ML Pen Inject 7.5 mg into the skin once a week. 01/07/22  Yes [name], MD  traMADol (ULTRAM) 50 MG tablet TAKE 1 TABLET(50 MG) BY MOUTH EVERY 4 HOURS FOR UP TO 5 DAYS AS NEEDED FOR COUGH OR PAIN 06/15/21  Yes Tanda Rockers, MD  venlafaxine XR (EFFEXOR XR) 150 MG 24 hr capsule Take 1 capsule (150 mg  total) by mouth daily with breakfast. 11/12/21  Yes Mozingo, Berdie Ogren, NP  venlafaxine XR (EFFEXOR XR) 75 MG 24 hr capsule Take 1 capsule (75 mg total) by mouth daily with breakfast. 11/12/21  Yes Mozingo, Berdie Ogren, NP    Allergies  Allergen Reactions   Ibuprofen Swelling    Bilateral swelling of ankles and feet   Diflucan [Fluconazole] Other (See Comments)    Pelvic pain   Onion Other (See Comments)    migraines    Topamax [Topiramate] Other (See Comments)    Reaction:  Body aches     Omeprazole    Hydrocodone Anxiety, Palpitations and  Other (See Comments)    Reaction:  Migraines     Gynecologic History: Patient's last menstrual period was 09/07/2009.  Obstetric History: [id number]  Social History   Socioeconomic History   Marital status: Single    Spouse name: Not on file   Number of children: 1   Years of education: Not on file   Highest education level: Not on file  Occupational History   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[employer]  Tobacco Use   Smoking status: Never   Smokeless tobacco: Never  Vaping Use   Vaping Use: Never used  Substance and Sexual Activity   Alcohol use: Not Currently    Comment: occassional- once a month has about 2-3 drinks   Drug use: No   Sexual activity: Not Currently  Other Topics Concern   Not on file  Social History Narrative   Not on file   Social Determinants of Health   Financial Resource Strain: Not on file  Food Insecurity: Not on file  Transportation Needs: Not on file  Physical Activity: Not on file  Stress: Not on file  Social Connections: Not on file  Intimate Partner Violence: Not on file    Family History  Problem Relation Age of Onset   Stroke Father    Heart attack Father    Hyperlipidemia Mother    Hypertension Mother    Breast cancer Maternal Aunt 75   Colon cancer Neg Hx    Colon polyps Neg Hx    Kidney disease Neg Hx    Gallbladder disease Neg Hx    Esophageal cancer Neg Hx    Alcohol abuse  Neg Hx    Drug abuse Neg Hx    Depression Neg Hx    Bipolar disorder Neg Hx    Anxiety disorder Neg Hx    Schizophrenia Neg Hx    Suicidality Neg Hx     Review of Systems  Constitutional:  Negative for chills and fever.  HENT:  Negative for congestion and sore throat.   Eyes:  Negative for blurred vision and double vision.  Respiratory:  Negative for shortness of breath.   Cardiovascular:  Negative for chest pain.  Gastrointestinal:  Positive for nausea. Negative for heartburn and vomiting.  Genitourinary: Negative.   Musculoskeletal: Negative.  Negative for myalgias.  Skin:  Negative for rash.  Neurological:  Negative for dizziness and headaches.  Endo/Heme/Allergies:  Does not bruise/bleed easily.  Psychiatric/Behavioral:  Negative for depression. The patient is not nervous/anxious.      Physical Exam BP 120/78   Pulse 92   Temp (!) 96.1 F (35.6 C) (Temporal)   Ht 5' 4.5" (1.638 m)   Wt 211 lb 8 oz (95.9 kg)   LMP 09/07/2009   SpO2 98%   BMI 35.74 kg/m    BP Readings from Last 3 Encounters:  01/21/22 120/78  11/05/21 124/84  10/04/21 124/82   Wt Readings from Last 3 Encounters:  01/21/22 211 lb 8 oz (95.9 kg)  11/05/21 235 lb 2 oz (106.7 kg)  09/10/21 234 lb (106.1 kg)       Physical Exam Constitutional:      General: She is not in acute distress.    Appearance: She is well-developed. She is not diaphoretic.  HENT:     Head: Normocephalic and atraumatic.     Right Ear: External ear normal.     Left Ear: External ear normal.     Nose: Nose normal.  Eyes:     General: No scleral icterus.  Extraocular Movements: Extraocular movements intact.     Conjunctiva/sclera: Conjunctivae normal.  Cardiovascular:     Rate and Rhythm: Normal rate and regular rhythm.     Heart sounds: No murmur heard. Pulmonary:     Effort: Pulmonary effort is normal. No respiratory distress.     Breath sounds: Normal breath sounds. No wheezing.  Abdominal:     General:  Bowel sounds are normal. There is no distension.     Palpations: Abdomen is soft. There is no mass.     Tenderness: There is abdominal tenderness in the right lower quadrant. There is no guarding or rebound.  Musculoskeletal:        General: Normal range of motion.     Cervical back: Neck supple.  Lymphadenopathy:     Cervical: No cervical adenopathy.  Skin:    General: Skin is warm and dry.     Capillary Refill: Capillary refill takes less than 2 seconds.  Neurological:     Mental Status: She is alert and oriented to person, place, and time.     Deep Tendon Reflexes: Reflexes normal.  Psychiatric:        Mood and Affect: Mood normal.        Behavior: Behavior normal.     Results:  PHQ-9:  [place] Office Visit from 01/21/2022 in [place] at [place]  PHQ-9 Total Score 10         Assessment: 51 y.o. [phone] female here for routine annual physical examination.  Plan: Problem List Items Addressed This Visit       Other   Morbid obesity ([place])    C/b HTN, HLD, depression. Losing weight with mounjaro but severe nausea. Decrease dose to 5 mg and reassess.       Relevant Medications   tirzepatide (MOUNJARO) 5 MG/0.5ML Pen   Mixed hyperlipidemia   Relevant Orders   Lipid panel   Prediabetes   Relevant Orders   Hemoglobin A1c   Post-menopausal bleeding    S/p supracervical hysterectomy. Monthly x 3 months. LLQ abdominal pain - offered [place] but patient will wait to see OB. PAP ASCUS 2022 HPV negative.       Relevant Orders   Ambulatory referral to Obstetrics / Gynecology   Other Visit Diagnoses     Annual physical exam    -  Primary   Need for influenza vaccination       Relevant Orders   Flu Vaccine QUAD 64moIM (Fluarix, Fluzone & Alfiuria Quad PF) (Completed)       Screening: -- Blood pressure screen normal -- cholesterol screening: not due for screening -- Weight screening: normal -- Diabetes Screening: not due for screening --  Nutrition: Encouraged healthy diet  The 10-year ASCVD risk score (Arnett DK, et al., 2019) is: 1.9%   Values used to calculate the score:     Age: [age]years     Sex: Female     Is Non-Hispanic African American: No     Diabetic: No     Tobacco smoker: No     Systolic Blood Pressure: 1858mmHg     Is BP treated: Yes     HDL Cholesterol: 52 mg/dL     Total Cholesterol: 230 mg/dL  -- Statin therapy for Age 50-75with CVD risk >7.5%  Psych -- Depression screening (PHQ-9):  [place] PH[place]-9 Total Score 10        Safety -- tobacco  screening: not using -- alcohol screening:  low-risk usage. -- no evidence of domestic violence or intimate partner violence.   Cancer Screening -- pap smear not collected per ASCCP guidelines -- family history of breast cancer screening: done. not at high risk. -- Mammogram -  up to date -- Colon cancer (age 62+)--  up to date  Immunizations Immunization History  Administered Date(s) Administered   Influenza,inj,Quad PF,6+ Mos 01/02/2015, 01/29/2016, 01/17/2017, 01/10/2018, 01/17/2019, 01/01/2021, 01/21/2022   Influenza-Unspecified 12/31/2019   PFIZER(Purple Top)SARS-COV-2 Vaccination 07/19/2019, 08/12/2019    -- flu vaccine up to date -- TDAP q10 years not up to date - declined -- Shingles (age >53) not up to date - declined -- Covid-19 Vaccine up to date   Encouraged healthy diet and exercise. Encouraged regular vision and dental care.    [name], MD

## 2022-01-21 NOTE — Assessment & Plan Note (Signed)
S/p supracervical hysterectomy. Monthly x 3 months. LLQ abdominal pain - offered [place] but patient will wait to see OB. PAP ASCUS 2022 HPV negative.

## 2022-01-22 LAB — HEMOGLOBIN A1C
Est. average glucose Bld gHb Est-mCnc: 117 mg/dL
Hgb A1c MFr Bld: 5.7 % — ABNORMAL HIGH (ref 4.8–5.6)

## 2022-01-22 LAB — LIPID PANEL
Chol/HDL Ratio: 4.5 ratio — ABNORMAL HIGH (ref 0.0–4.4)
Cholesterol, Total: 197 mg/dL (ref 100–199)
HDL: 44 mg/dL (ref >39)
LDL Chol Calc (NIH): 126 mg/dL — ABNORMAL HIGH (ref 0–99)
Triglycerides: 154 mg/dL — ABNORMAL HIGH (ref 0–149)
VLDL Cholesterol Cal: 27 mg/dL (ref 5–40)

## 2022-01-28 ENCOUNTER — Encounter: Payer: Self-pay | Admitting: Adult Health

## 2022-01-28 ENCOUNTER — Ambulatory Visit: Payer: 59 | Admitting: Adult Health

## 2022-01-28 DIAGNOSIS — F422 Mixed obsessional thoughts and acts: Secondary | ICD-10-CM | POA: Diagnosis not present

## 2022-01-28 DIAGNOSIS — F331 Major depressive disorder, recurrent, moderate: Secondary | ICD-10-CM

## 2022-01-28 DIAGNOSIS — F4000 Agoraphobia, unspecified: Secondary | ICD-10-CM

## 2022-01-28 DIAGNOSIS — F411 Generalized anxiety disorder: Secondary | ICD-10-CM | POA: Diagnosis not present

## 2022-01-28 DIAGNOSIS — F410 Panic disorder [episodic paroxysmal anxiety] without agoraphobia: Secondary | ICD-10-CM

## 2022-01-28 NOTE — Progress Notes (Signed)
Patricia Bauer [id number] March 30, 1971 51 y.o.  Subjective:   Patient ID:  Patricia Bauer is a 51 y.o. (DOB 12-Dec-1970) female.  Chief Complaint: No chief complaint on file.   HPI Patricia Bauer presents to the office today for follow-up of  MDD, GAD, insomnia, panic attacks, and agoraphobia.   Describes mood today as "ok". Pleasant. Tearful at times. Mood symptoms - reports depression - not wanting to do things. Feels anxious and irritable at times. Increased pain issues. Reports increased worry and rumination - "all the time and constantly". Always thinking about the worst case scenarios. Reports a "few" panic attacks. Increased skin picking - "worse with increased anxiety". Feels like medications are helpful, but willing to consider other options. She and daughter getting along better - living together for now. Varying interest and motivation. Taking medications as prescribed.  Energy levels lower. Active, does not have a regular exercise routine. Enjoys some usual interests and activities. Single. Divorced. Lives with daughter (54) 2 children - 4 and 24 months. Spending time with family and friends. Appetite adequate. Reporting weight loss 20+ pounds - started on Mounjaro. Sleeps better some nights than others. Averages 4 to 6 hours. Focus and concentration "that's out the window - horrible - fibro fog". Completing tasks. Managing aspects of household. Works full time for [employer].  Denies SI or HI. Denies AH or VH. Denies self harm - skin picking. Therapist - Marya Fossa - x 2 years.  Diagnosed with Fibromylagia and RA. Diagnosed with tachyphylaxis.  Previous medication trials:  Celexa, Zoloft - flat, Wellbutrin   GAD-7    Flowsheet Row Office Visit from 01/21/2022 in [place] at [facility] Visit from 02/23/2021 in [facility] Visit from 01/21/2021 in [place] at [facility] Visit from 10/10/2019 in [place]  at [facility] Visit from 03/28/2019 in [place]  Total GAD-7 Score '10 5 6 10 7      '$ PHQ2-9    [place] Office Visit from 01/21/2022 in [place] at [place] Visit from 02/23/2021 in [facility] Visit from 01/21/2021 in [place] at [facility] Visit from 12/31/2019 in [place] at [facility] Visit from 10/10/2019 in [place] at [place]  PHQ-2 Total Score '2 2 2 2 '$ 0  PHQ-9 Total Score '10 15 14 8 8      '$ Flowsheet Row ED from 06/22/2021 in [place] No Risk        Review of Systems:  Review of Systems  Musculoskeletal:  Negative for gait problem.  Neurological:  Negative for tremors.  Psychiatric/Behavioral:         Please refer to HPI    Medications: I have reviewed the patient's current medications.  Current Outpatient Medications  Medication Sig Dispense Refill   ALPRAZolam (XANAX) 0.25 MG tablet TAKE 1 TABLET(0.25 MG) BY MOUTH DAILY AS NEEDED FOR ANXIETY OR SLEEP 30 tablet 2   amLODipine (NORVASC) 10 MG tablet TAKE 1 TABLET(10 MG) BY MOUTH DAILY 90 tablet 0   famotidine (PEPCID) 20 MG tablet SMARTSIG:1 Tablet(s) By Mouth Every Evening     gabapentin (NEURONTIN) 300 MG capsule Take 1 capsule (300 mg total) by mouth 2 (two) times daily. 60 capsule 2   pantoprazole (PROTONIX) 40 MG tablet TAKE 1 TABLET(40 MG) BY MOUTH DAILY 30 TO 60 MINUTES BEFORE FIRST MEAL OF THE DAY  30 tablet 2   SUMAtriptan (IMITREX) 50 MG tablet TAKE 1 TABLET MAY REPEAT IN 2 HOURS IF HEADACHE PERSISTS OR RECURS. DON'T USE MORE THAN TWO IN 24HRS 10 tablet 2   tirzepatide (MOUNJARO) 5 MG/0.5ML Pen Inject 5 mg into the skin once a week. 2 mL 1   traMADol (ULTRAM) 50 MG tablet TAKE 1 TABLET(50 MG) BY MOUTH EVERY 4 HOURS FOR UP TO 5 DAYS AS NEEDED FOR COUGH OR PAIN 30 tablet 0   venlafaxine XR (EFFEXOR XR) 150 MG 24 hr capsule Take  1 capsule (150 mg total) by mouth daily with breakfast. 30 capsule 5   venlafaxine XR (EFFEXOR XR) 75 MG 24 hr capsule Take 1 capsule (75 mg total) by mouth daily with breakfast. 30 capsule 5   No current facility-administered medications for this visit.    Medication Side Effects: None  Allergies:  Allergies  Allergen Reactions   Ibuprofen Swelling    Bilateral swelling of ankles and feet   Diflucan [Fluconazole] Other (See Comments)    Pelvic pain   Onion Other (See Comments)    migraines    Topamax [Topiramate] Other (See Comments)    Reaction:  Body aches     Omeprazole    Hydrocodone Anxiety, Palpitations and Other (See Comments)    Reaction:  Migraines     Past Medical History:  Diagnosis Date   Anemia    Anxiety    Colon polyp    Depression    GERD (gastroesophageal reflux disease)    Headache    migraines    History of hysterectomy    still has both ovaries and cervix   History of migraine headaches    History of ovarian cyst    Hypertension    PONV (postoperative nausea and vomiting)     Past Medical History, Surgical history, Social history, and Family history were reviewed and updated as appropriate.   Please see review of systems for further details on the patient's review from today.   Objective:   Physical Exam:  LMP 09/07/2009   Physical Exam Constitutional:      General: She is not in acute distress. Musculoskeletal:        General: No deformity.  Neurological:     Mental Status: She is alert and oriented to person, place, and time.     Coordination: Coordination normal.  Psychiatric:        Attention and Perception: Attention and perception normal. She does not perceive auditory or visual hallucinations.        Mood and Affect: Mood normal. Mood is not anxious or depressed. Affect is not labile, blunt, angry or inappropriate.        Speech: Speech normal.        Behavior: Behavior normal.        Thought Content: Thought content normal.  Thought content is not paranoid or delusional. Thought content does not include homicidal or suicidal ideation. Thought content does not include homicidal or suicidal plan.        Cognition and Memory: Cognition and memory normal.        Judgment: Judgment normal.     Comments: Insight intact     Lab Review:     Component Value Date/Time   NA 138 07/01/2021 1305   NA 139 03/13/2013 2022   K 4.6 07/01/2021 1305   K 3.6 03/13/2013 2022   CL 100 07/01/2021 1305   CL 106 03/13/2013 2022   CO2 20 07/01/2021  1305   CO2 26 03/13/2013 2022   GLUCOSE 93 07/01/2021 1305   GLUCOSE 110 (H) 05/04/2020 0503   GLUCOSE 94 03/13/2013 2022   BUN 11 07/01/2021 1305   BUN 11 03/13/2013 2022   CREATININE 0.85 07/01/2021 1305   CREATININE 1.05 (H) 05/24/2019 1359   CREATININE 0.80 03/13/2013 2022   CALCIUM 9.4 07/01/2021 1305   CALCIUM 8.7 03/13/2013 2022   PROT 6.7 07/01/2021 1305   ALBUMIN 4.1 07/01/2021 1305   AST 33 07/01/2021 1305   AST 27 05/24/2019 1359   ALT 56 (H) 07/01/2021 1305   ALT 32 05/24/2019 1359   ALKPHOS 101 07/01/2021 1305   BILITOT <0.2 07/01/2021 1305   BILITOT 0.3 05/24/2019 1359   GFRNONAA >60 05/04/2020 0503   GFRNONAA >60 05/24/2019 1359   GFRNONAA >60 03/13/2013 2022   GFRAA 89 12/31/2019 1451   GFRAA >60 05/24/2019 1359   GFRAA >60 03/13/2013 2022       Component Value Date/Time   WBC 11.0 (H) 06/11/2021 1331   WBC 8.1 05/14/2021 1429   RBC 4.55 06/11/2021 1332   RBC 4.59 06/11/2021 1331   HGB 14.5 06/11/2021 1331   HGB 11.9 01/17/2019 1519   HGB 12.0 01/17/2019 1519   HCT 43.4 06/11/2021 1331   HCT 36.1 01/17/2019 1519   HCT 36.5 01/17/2019 1519   PLT 289 06/11/2021 1331   PLT 333 01/17/2019 1519   PLT 338 01/17/2019 1519   MCV 94.6 06/11/2021 1331   MCV 82 01/17/2019 1519   MCV 83 01/17/2019 1519   MCV 85 03/13/2013 2022   MCH 31.6 06/11/2021 1331   MCHC 33.4 06/11/2021 1331   RDW 12.1 06/11/2021 1331   RDW 14.1 01/17/2019 1519   RDW 14.2  01/17/2019 1519   RDW 13.4 03/13/2013 2022   LYMPHSABS 2.4 06/11/2021 1331   LYMPHSABS 2.8 01/17/2019 1519   LYMPHSABS 3.0 01/17/2019 1519   MONOABS 0.6 06/11/2021 1331   EOSABS 0.1 06/11/2021 1331   EOSABS 0.2 01/17/2019 1519   EOSABS 0.2 01/17/2019 1519   BASOSABS 0.1 06/11/2021 1331   BASOSABS 0.0 01/17/2019 1519   BASOSABS 0.0 01/17/2019 1519    No results found for: "POCLITH", "LITHIUM"   No results found for: "PHENYTOIN", "PHENOBARB", "VALPROATE", "CBMZ"   .res Assessment: Plan:    Plan:  PDMP reviewed  Effexor XR 150 and '75mg'$  every morning Xanax 0.'25mg'$  as needed - takes once a week  RTC 2 months  Patient advised to contact office with any questions, adverse effects, or acute worsening in signs and symptoms.  Discussed potential benefits, risk, and side effects of benzodiazepines to include potential risk of tolerance and dependence, as well as possible drowsiness.  Advised patient not to drive if experiencing drowsiness and to take lowest possible effective dose to minimize risk of dependence and tolerance.  There are no diagnoses linked to this encounter.   Please see After Visit Summary for patient specific instructions.  Future Appointments  Date Time Provider [place]  02/18/2022 12:20 PM Eugenia Pancoast, FNP [facility]  03/18/2022  1:00 PM Wilberta Dorvil, Berdie Ogren, NP [facility] None  03/29/2022  2:10 PM Anyanwu, Sallyanne Havers, MD [facility]  06/10/2022  1:30 PM [facility] None  06/10/2022  1:45 PM Celso Amy, NP [facility] None    No orders of the defined types were placed in this encounter.   -------------------------------

## 2022-01-30 ENCOUNTER — Encounter: Payer: Self-pay | Admitting: Internal Medicine

## 2022-01-31 MED ORDER — BENZONATATE 200 MG PO CAPS: 200.0000 mg | ORAL_CAPSULE | Freq: Three times a day (TID) | ORAL | 2 refills | Status: DC | PRN

## 2022-02-13 IMAGING — CT CT CHEST W/O CM
2 of 4 series · 15 of 36 positions shown, 18 images · non-contrast
Comparison: None.

CLINICAL DATA: Diffuse interstitial disease. Nasal congestion,
cough



[Series 2: thorax · axial · 0.61mm/px · z∈[-734,-452]mm · 12 of 167 slices shown, 15 images]
[im 13/167  mediastinal]
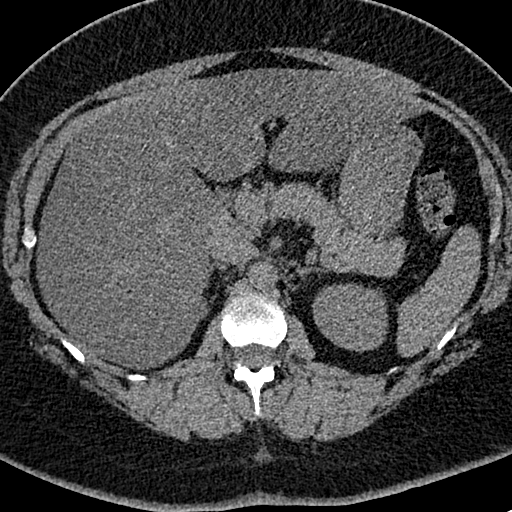
[im 13/167  lung]
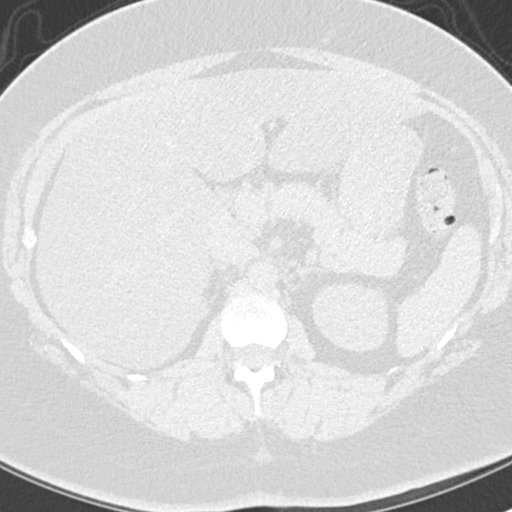
[im 26/167  lung]
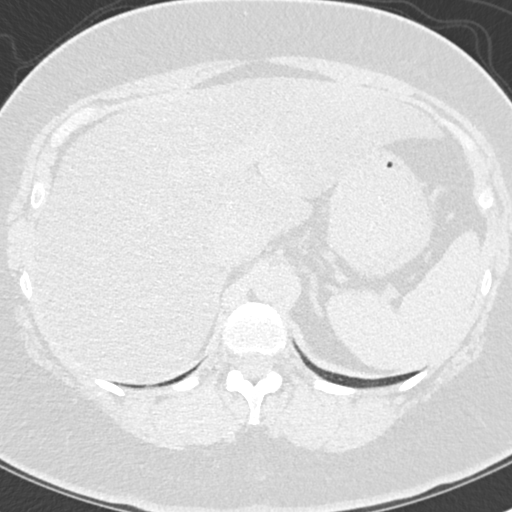
[im 39/167  lung]
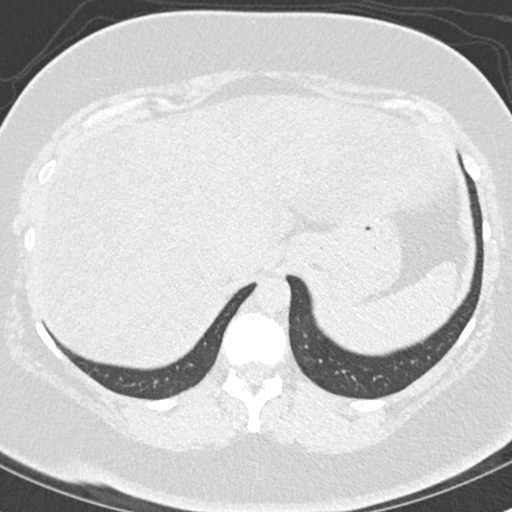
[im 52/167  lung]
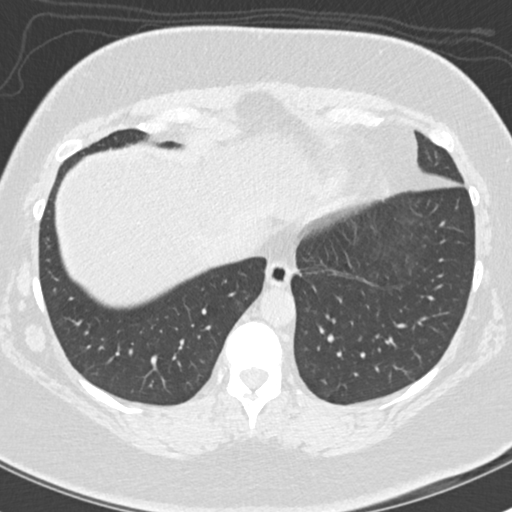
[im 64/167  mediastinal]
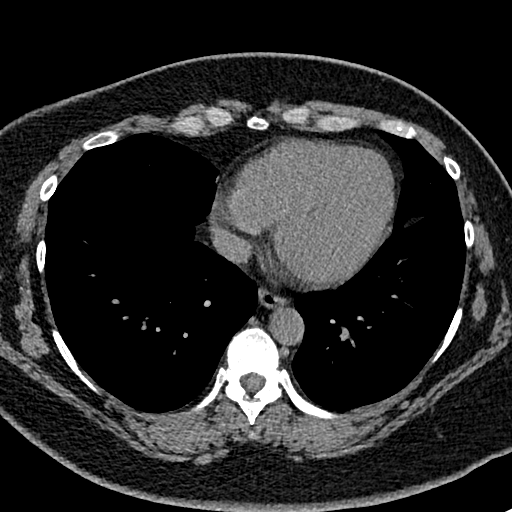
[im 64/167  lung]
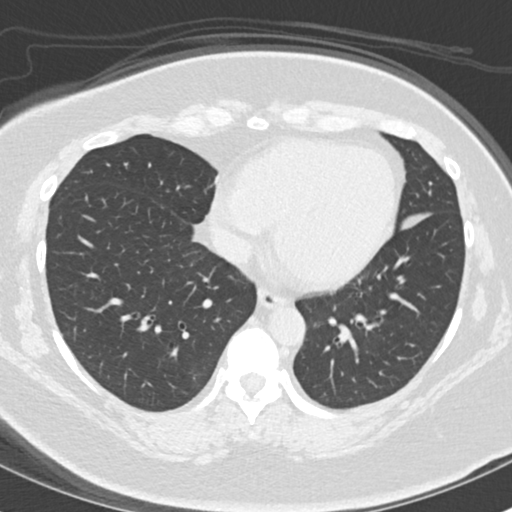
[im 77/167  lung]
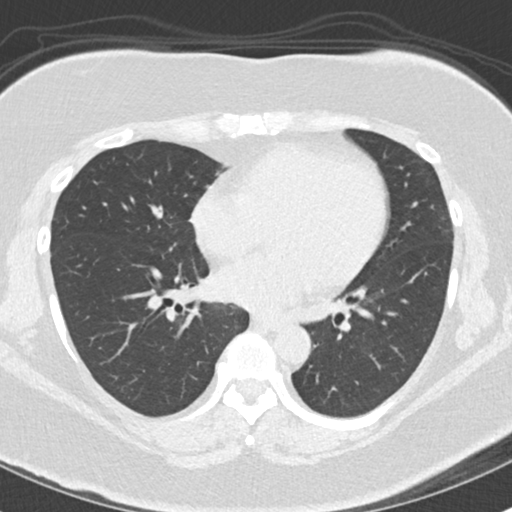
[im 90/167  lung]
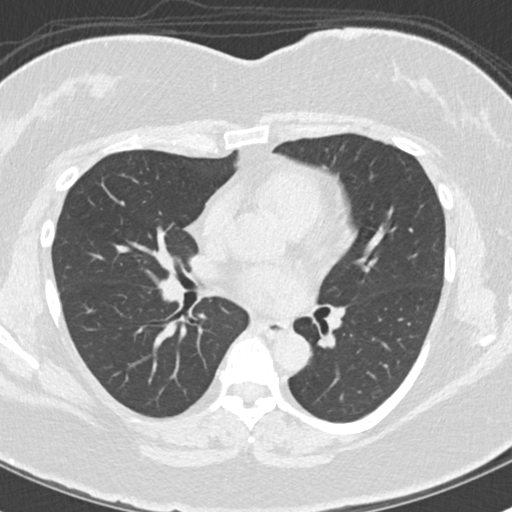
[im 103/167  lung]
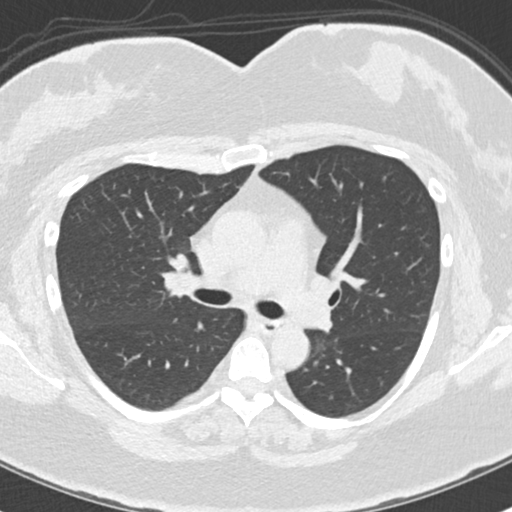
[im 115/167  mediastinal]
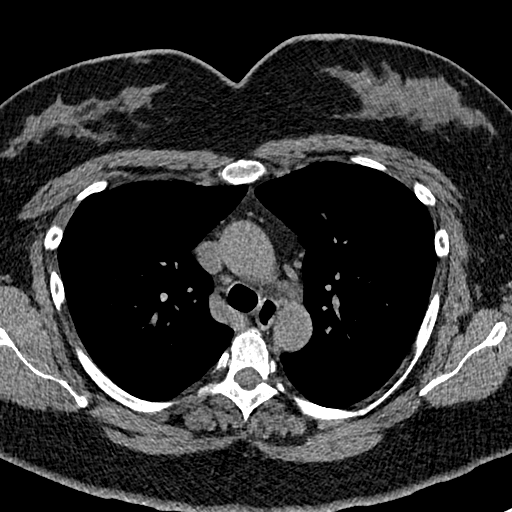
[im 115/167  lung]
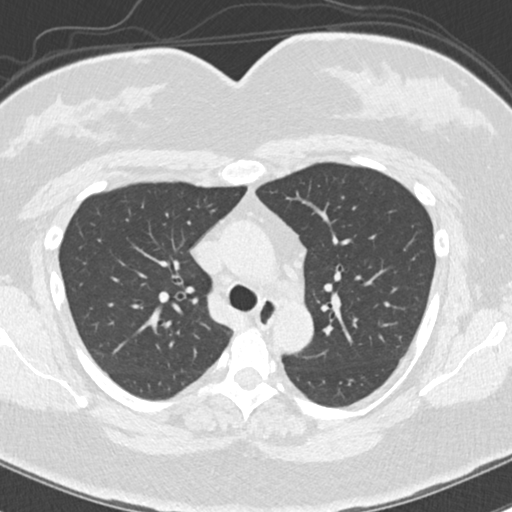
[im 128/167  lung]
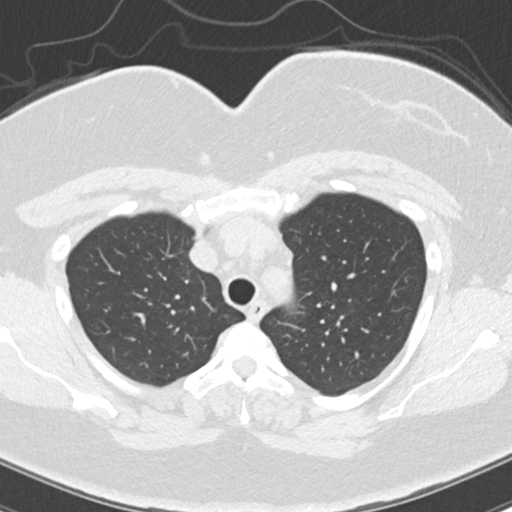
[im 141/167  lung]
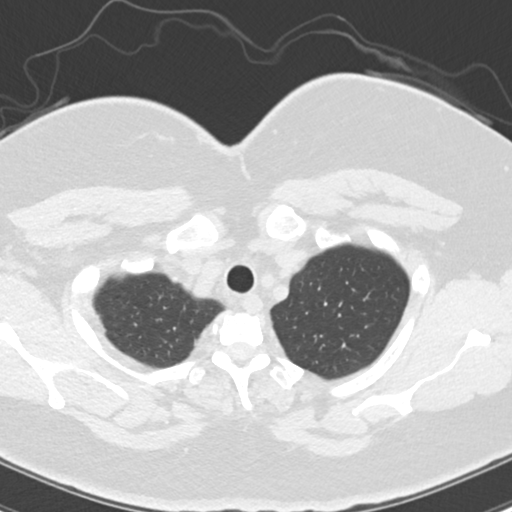
[im 154/167  lung]
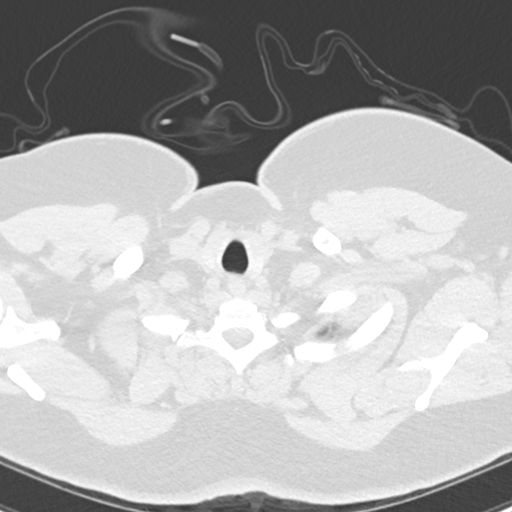

[Series 5: coronal · coronal · 0.60mm/px · 3 of 115 slices shown]
[im 23/115  lung]
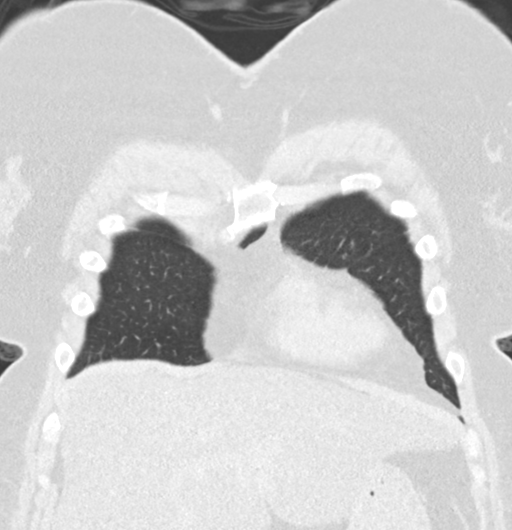
[im 46/115  lung]
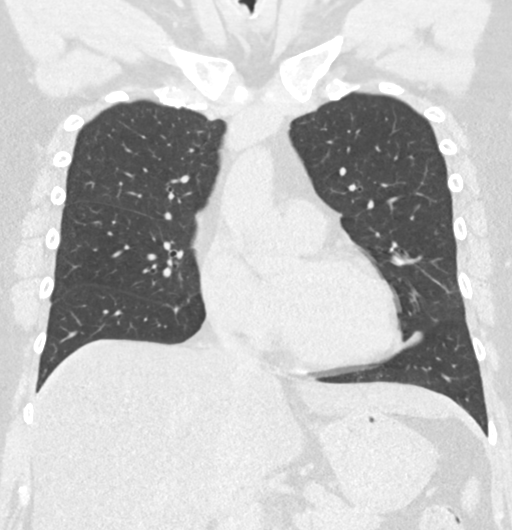
[im 69/115  lung]
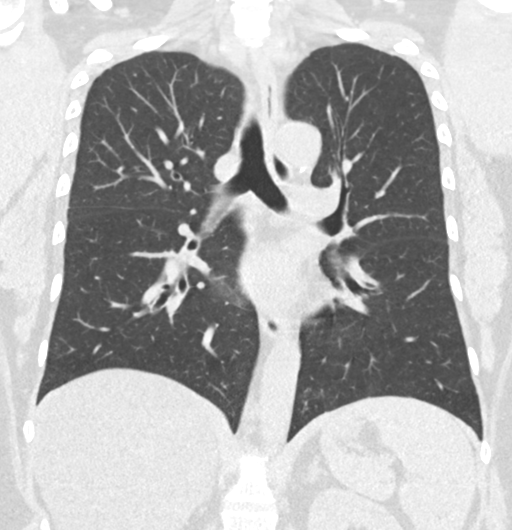

[15 of 36 positions shown; findings below may reference images not displayed]

FINDINGS: Cardiovascular: No significant vascular findings. Normal heart size.
No pericardial effusion.

Mediastinum/Nodes: No enlarged mediastinal or axillary lymph nodes.
Thyroid gland, trachea, and esophagus demonstrate no significant
findings.

Lungs/Pleura: Lungs are clear. No pleural effusion or pneumothorax.

Upper Abdomen: Diffuse low attenuation of the liver as can be seen
with hepatic steatosis. No acute abdominal abnormality.

Musculoskeletal: No acute osseous abnormality. No aggressive osseous
lesion.
IMPRESSION: 1. No acute cardiopulmonary disease.

## 2022-02-13 IMAGING — DX DG CHEST 2V
2 series · 2 of 2 positions shown · non-contrast
Comparison: Radiographs 11/24/2017 and 12/09/2015.

CLINICAL DATA: Coughing and wheezing for 1 month.

EXAM:
CHEST - 2 VIEW

[chest pa]
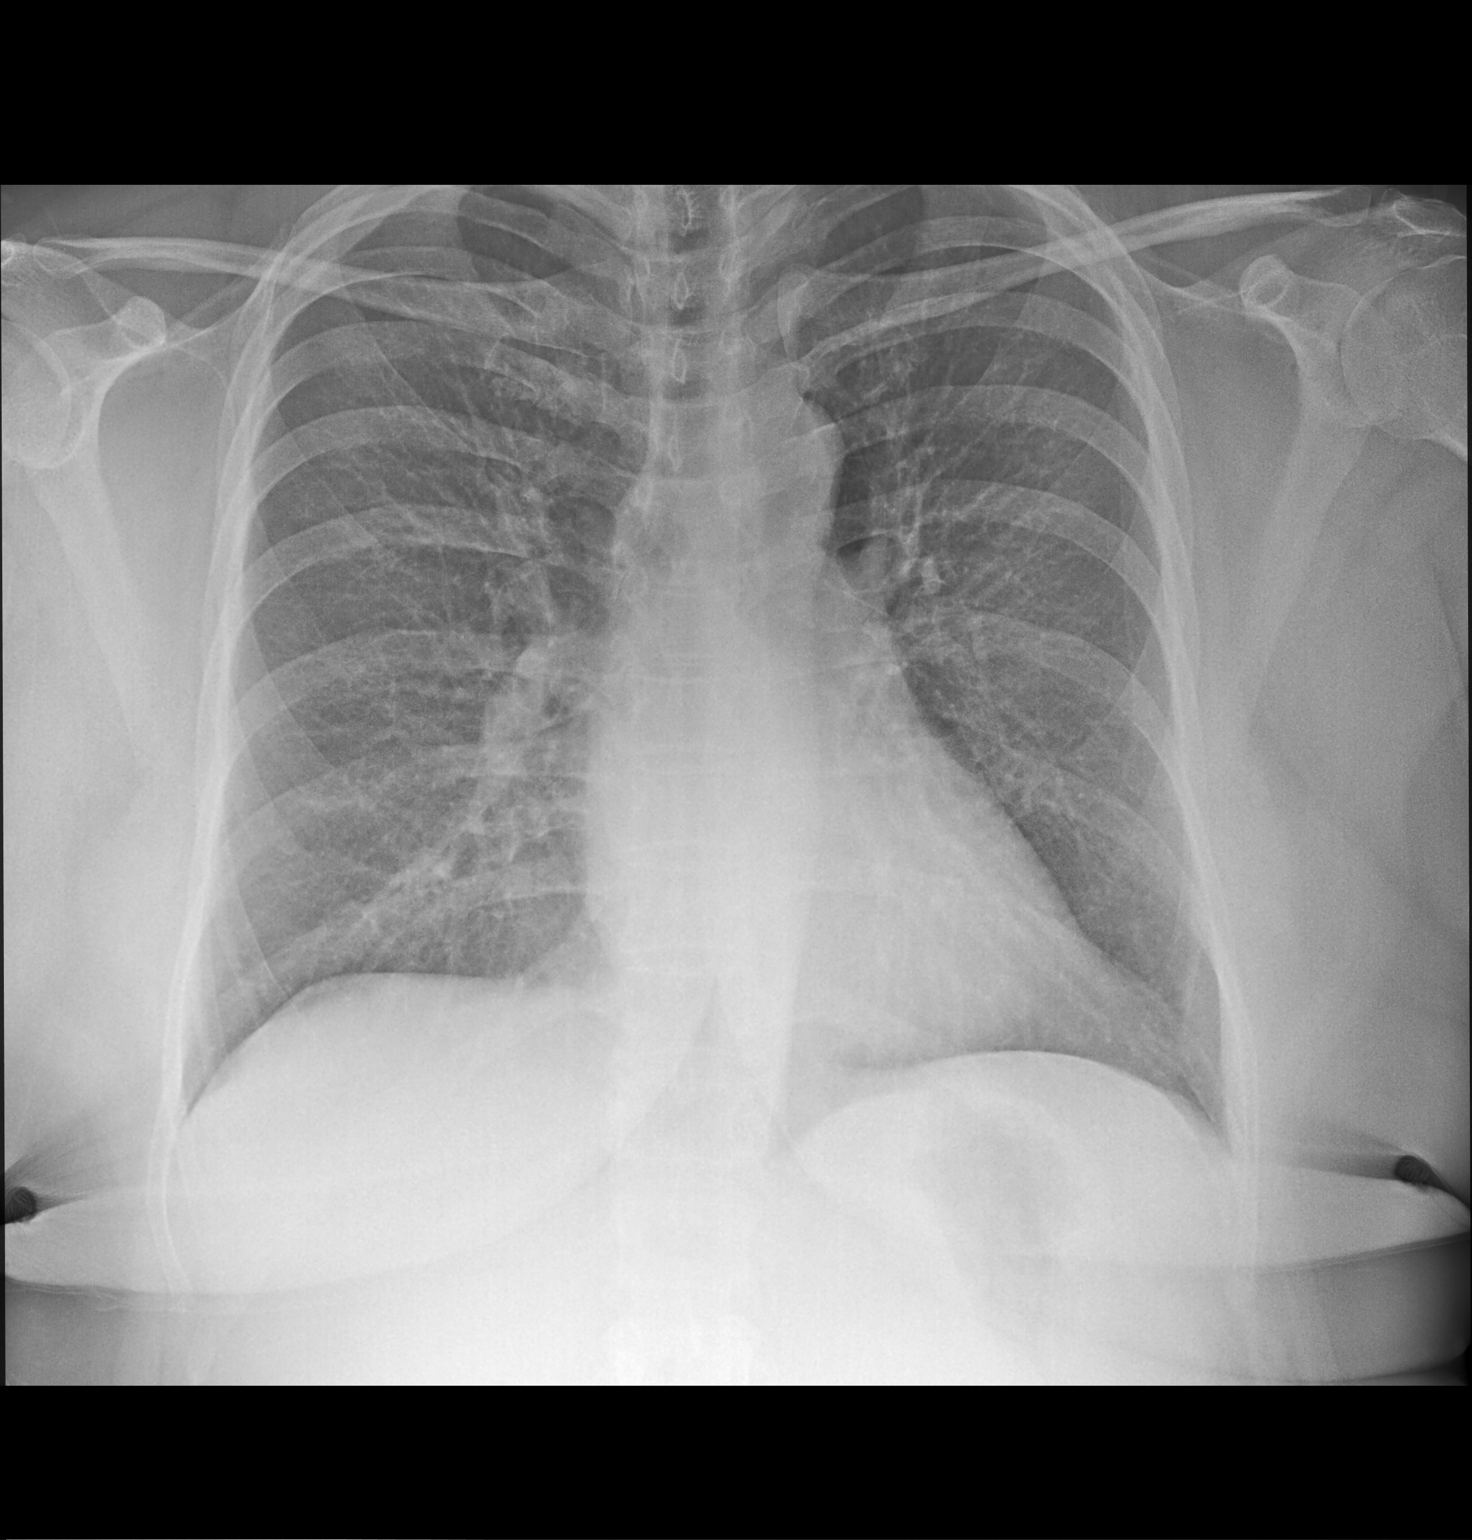

[chest lat]
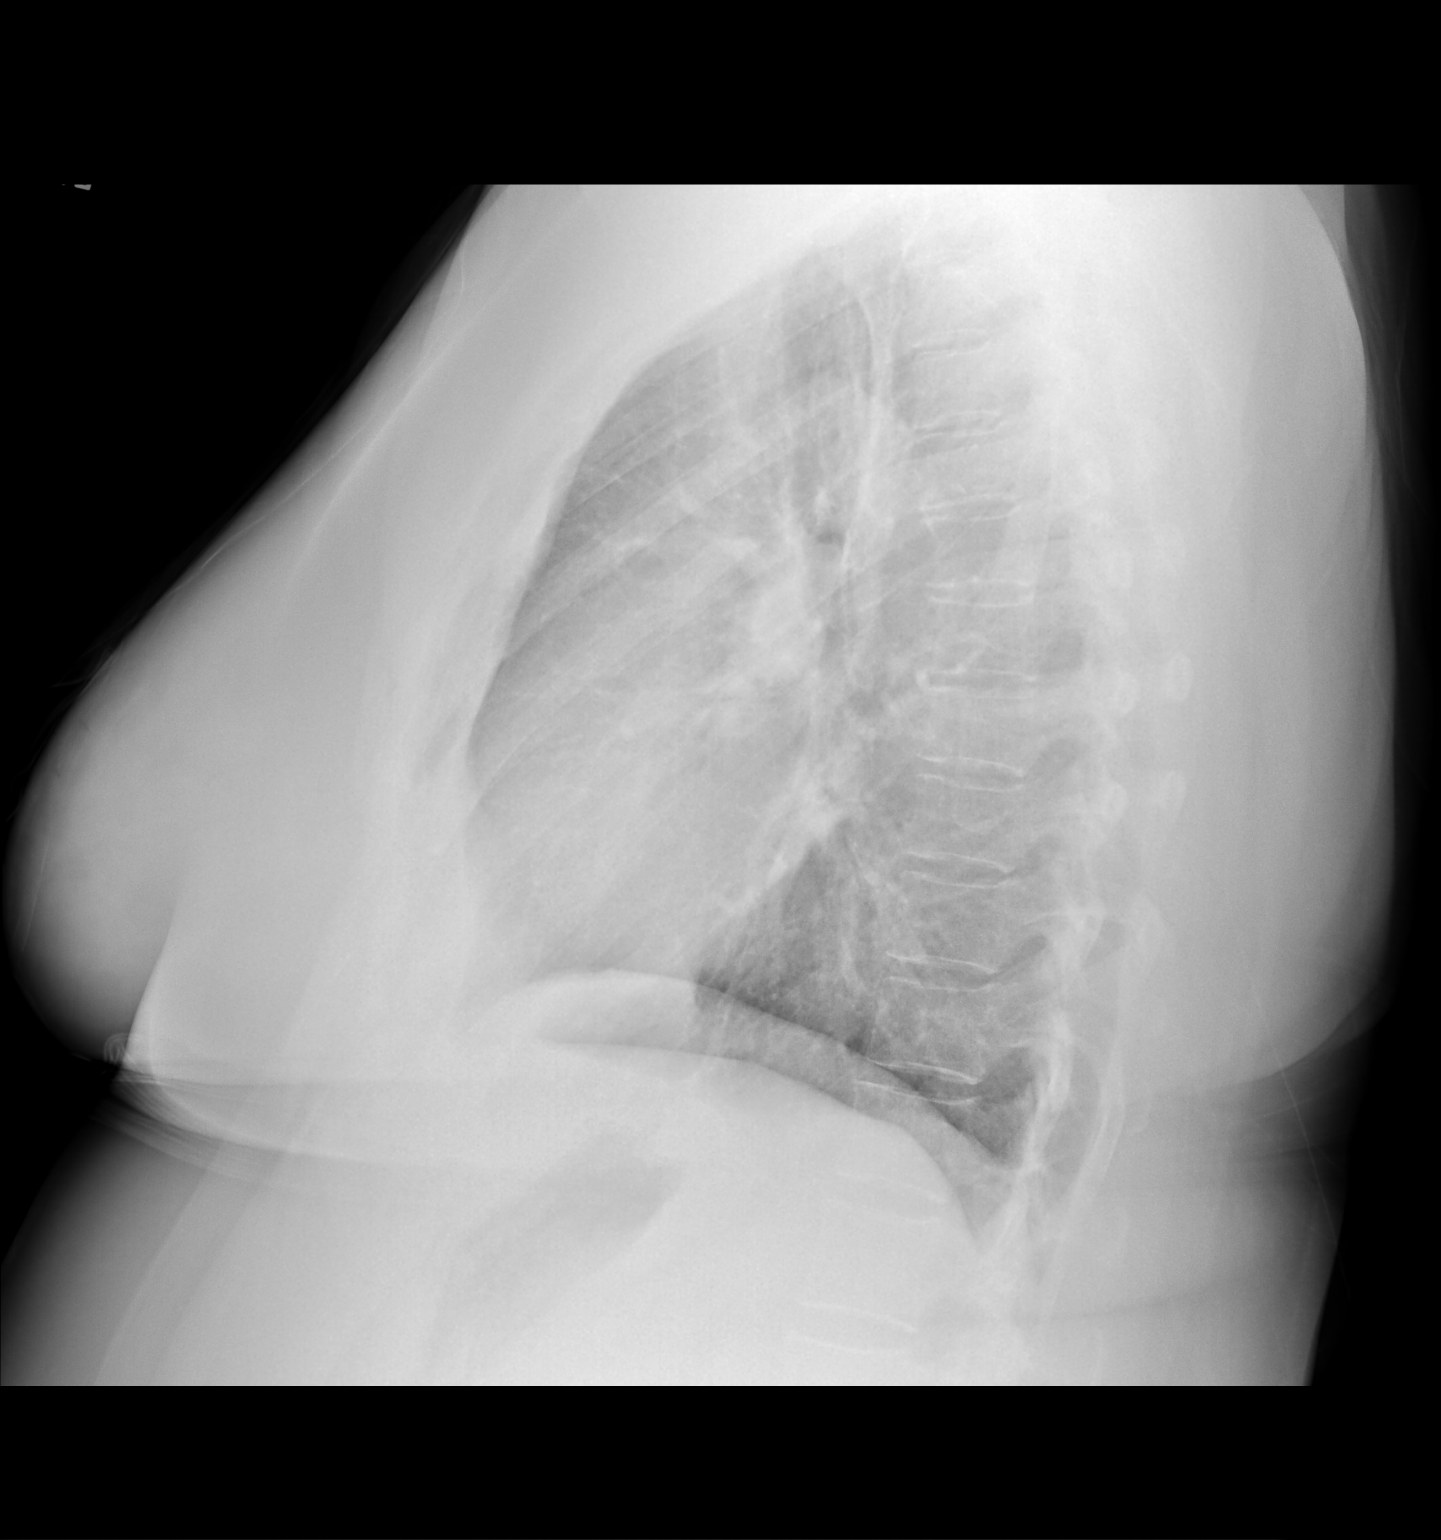

[2 of 2 positions shown; findings below may reference images not displayed]

FINDINGS: The heart size and mediastinal contours are stable. Suspected mild
interstitial prominence which could be due to technical differences
between the studies. The pulmonary vascularity is normal. There is
no confluent airspace opacity, pleural effusion or pneumothorax. The
bones appear unremarkable.
IMPRESSION: No definite acute cardiopulmonary process. Possible mild diffuse
pulmonary interstitial prominence.

## 2022-02-14 ENCOUNTER — Telehealth: Payer: Self-pay

## 2022-02-14 NOTE — Telephone Encounter (Signed)
Prior auth started for [facility] '5MG'$ /0.5ML pen-injectors. Valetta Close Key: [id number] - [place] Case ID: [id number] - Rx #: [id number] Waiting for determination.

## 2022-02-15 NOTE — Telephone Encounter (Signed)
Prior Josem Kaufmann was cancelled on Cover [employer] for [employer] '5MG'$ /0.5ML pen-injectors. Valetta Close Key: [id number] - PA Case ID: [id number] - Rx #: [id number] We received a letter from [employer] that a prior auth is not needed at this time.  Cancellation letter sent to scanning.  Patient notified via mychart.

## 2022-02-18 ENCOUNTER — Encounter: Payer: Self-pay | Admitting: Family

## 2022-02-18 ENCOUNTER — Ambulatory Visit: Payer: 59 | Admitting: Family

## 2022-02-18 VITALS — BP 122/84 | HR 82 | Temp 98.5°F | Resp 16 | Ht 64.5 in | Wt 211.0 lb

## 2022-02-18 DIAGNOSIS — J9801 Acute bronchospasm: Secondary | ICD-10-CM

## 2022-02-18 DIAGNOSIS — M797 Fibromyalgia: Secondary | ICD-10-CM | POA: Diagnosis not present

## 2022-02-18 DIAGNOSIS — M21612 Bunion of left foot: Secondary | ICD-10-CM

## 2022-02-18 DIAGNOSIS — J00 Acute nasopharyngitis [common cold]: Secondary | ICD-10-CM | POA: Diagnosis not present

## 2022-02-18 DIAGNOSIS — J309 Allergic rhinitis, unspecified: Secondary | ICD-10-CM

## 2022-02-18 MED ORDER — AMOXICILLIN-POT CLAVULANATE 875-125 MG PO TABS: 1.0000 | ORAL_TABLET | Freq: Two times a day (BID) | ORAL | 0 refills | Status: DC

## 2022-02-18 MED ORDER — PREDNISONE 20 MG PO TABS: ORAL_TABLET | ORAL | 0 refills | Status: DC

## 2022-02-18 NOTE — Assessment & Plan Note (Signed)
Rx prednisone 40 mg once daily for five days

## 2022-02-18 NOTE — Assessment & Plan Note (Signed)
Prescription given for augmentin 875/125 mg po bid for ten days. Pt to continue tylenol/ibuprofen prn sinus pain. Continue with humidifier prn and steam showers recommended as well. instructed If no symptom improvement in 48 hours please f/u ? ?

## 2022-02-18 NOTE — Patient Instructions (Addendum)
Recommend Flonase daily and continue zyrtec.  Continue Singulair 10 mg nightly.    Regards,   Eugenia Pancoast FNP-C

## 2022-02-18 NOTE — Progress Notes (Signed)
Established Patient Office Visit  Subjective:  [name], female    DOB: May 29, 1970  Age: 51 y.o. MRN: [id number]  CC:  Chief Complaint  Patient presents with   Transitions Of Care    HPI Patricia Bauer is here for a transition of care visit.  Prior provider was: Dr. Waunita Schooner, Md Pt is without acute concerns.   Three weeks ago started with cold like symptoms, has since worsened and hard to shake it. She has body aches, also with coughing and nasal congestion. She doesn't have much chest congestion. Does have slight sinus pressure. No sore throat or ear pain. No fever or chills. Otc was taking theraflu only helped slightly. Doesn't take anything regularly for allergies. Pulmonologist sent in tessalon perrles. Has albuterol inhaler having to use twice daily due to cough.   Gad, major depressive disorder: followed by psychiatry at [facility]. On effexor 225 mg once daily.   chronic concerns:  Migraines: since starting effexor have been managed. No recent migraines.   Fibromyalgia: using tramadol here and there for management of flares. Sees rheumatologist, every three months or so.   Past Medical History:  Diagnosis Date   Anemia    Anxiety    Colon polyp    Depression    GERD (gastroesophageal reflux disease)    Headache    migraines    History of hysterectomy    still has both ovaries and cervix   History of migraine headaches    History of ovarian cyst    Hypertension    PONV (postoperative nausea and vomiting)     Past Surgical History:  Procedure Laterality Date   COLONOSCOPY     knee athroscopy  08/2010   LT   LAPAROSCOPIC SUPRACERVICAL HYSTERECTOMY     LAPAROSCOPIC VAGINAL HYSTERECTOMY  08/2009   Supra cervical, still with ovaries and cervix, took out uterus   PARTIAL KNEE ARTHROPLASTY Left 02/10/2014   Procedure: LEFT MEDIAL UNICOMPARTMENTAL KNEE ARTHROPLASTY;  Surgeon: Gearlean Alf, MD;  Location: [facility];  Service:  Orthopedics;  Laterality: Left;   UPPER GI ENDOSCOPY     WISDOM TOOTH EXTRACTION  1995    Family History  Problem Relation Age of Onset   Stroke Father    Heart attack Father    Hyperlipidemia Mother    Hypertension Mother    Breast cancer Maternal Aunt 75   Colon cancer Neg Hx    Colon polyps Neg Hx    Kidney disease Neg Hx    Gallbladder disease Neg Hx    Esophageal cancer Neg Hx    Alcohol abuse Neg Hx    Drug abuse Neg Hx    Depression Neg Hx    Bipolar disorder Neg Hx    Anxiety disorder Neg Hx    Schizophrenia Neg Hx    Suicidality Neg Hx     Social History   Socioeconomic History   Marital status: Single    Spouse name: Not on file   Number of children: 1   Years of education: Not on file   Highest education level: Not on file  Occupational History   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[employer]  Tobacco Use   Smoking status: Never   Smokeless tobacco: Never  Vaping Use   Vaping Use: Never used  Substance and Sexual Activity   Alcohol use: Not Currently    Comment: occassional- once a month has about 2-3 drinks   Drug use: No   Sexual activity: Not  Currently  Other Topics Concern   Not on file  Social History Narrative   Not on file   Social Determinants of Health   Financial Resource Strain: Not on file  Food Insecurity: Not on file  Transportation Needs: Not on file  Physical Activity: Not on file  Stress: Not on file  Social Connections: Not on file  Intimate Partner Violence: Not on file    Outpatient Medications Prior to Visit  Medication Sig Dispense Refill   ALPRAZolam (XANAX) 0.25 MG tablet TAKE 1 TABLET(0.25 MG) BY MOUTH DAILY AS NEEDED FOR ANXIETY OR SLEEP 30 tablet 2   amLODipine (NORVASC) 10 MG tablet TAKE 1 TABLET(10 MG) BY MOUTH DAILY 90 tablet 0   benzonatate (TESSALON) 200 MG capsule Take 1 capsule (200 mg total) by mouth 3 (three) times daily as needed for cough. 30 capsule 2   famotidine (PEPCID) 20 MG tablet SMARTSIG:1 Tablet(s) By  Mouth Every Evening     gabapentin (NEURONTIN) 300 MG capsule Take 1 capsule (300 mg total) by mouth 2 (two) times daily. 60 capsule 2   pantoprazole (PROTONIX) 40 MG tablet TAKE 1 TABLET(40 MG) BY MOUTH DAILY 30 TO 60 MINUTES BEFORE FIRST MEAL OF THE DAY 30 tablet 2   SUMAtriptan (IMITREX) 50 MG tablet TAKE 1 TABLET MAY REPEAT IN 2 HOURS IF HEADACHE PERSISTS OR RECURS. DON'T USE MORE THAN TWO IN 24HRS 10 tablet 2   tirzepatide (MOUNJARO) 5 MG/0.5ML Pen Inject 5 mg into the skin once a week. 2 mL 1   traMADol (ULTRAM) 50 MG tablet TAKE 1 TABLET(50 MG) BY MOUTH EVERY 4 HOURS FOR UP TO 5 DAYS AS NEEDED FOR COUGH OR PAIN 30 tablet 0   Turmeric Curcumin 500 MG CAPS Take 1 capsule by mouth daily.     venlafaxine XR (EFFEXOR XR) 150 MG 24 hr capsule Take 1 capsule (150 mg total) by mouth daily with breakfast. 30 capsule 5   venlafaxine XR (EFFEXOR XR) 75 MG 24 hr capsule Take 1 capsule (75 mg total) by mouth daily with breakfast. 30 capsule 5   Vitamin D-Vitamin K (VITAMIN K2-VITAMIN D3 PO) Take by mouth.     No facility-administered medications prior to visit.    Allergies  Allergen Reactions   Ibuprofen Swelling    Bilateral swelling of ankles and feet   Diflucan [Fluconazole] Other (See Comments)    Pelvic pain   Onion Other (See Comments)    migraines    Topamax [Topiramate] Other (See Comments)    Reaction:  Body aches     Omeprazole Other (See Comments)    Abdominal pain    Hydrocodone Anxiety, Palpitations and Other (See Comments)    Reaction:  Migraines         Objective:    Physical Exam Constitutional:      General: She is not in acute distress.    Appearance: Normal appearance. She is obese. She is not ill-appearing.  HENT:     Right Ear: Tympanic membrane normal.     Left Ear: Tympanic membrane normal.     Nose: Mucosal edema and congestion present. No rhinorrhea.     Right Turbinates: Not enlarged or swollen.     Left Turbinates: Not enlarged or swollen.     Right  Sinus: No maxillary sinus tenderness or frontal sinus tenderness.     Left Sinus: No maxillary sinus tenderness or frontal sinus tenderness.     Mouth/Throat:     Mouth: Mucous membranes are moist.  Pharynx: Posterior oropharyngeal erythema (cobblestoning) present. No pharyngeal swelling or oropharyngeal exudate.     Tonsils: No tonsillar exudate.  Eyes:     Extraocular Movements: Extraocular movements intact.     Conjunctiva/sclera: Conjunctivae normal.     Pupils: Pupils are equal, round, and reactive to light.  Neck:     Thyroid: No thyroid mass.  Cardiovascular:     Rate and Rhythm: Normal rate and regular rhythm.  Pulmonary:     Effort: Pulmonary effort is normal.     Breath sounds: Normal breath sounds.  Lymphadenopathy:     Cervical:     Right cervical: No superficial cervical adenopathy.    Left cervical: No superficial cervical adenopathy.  Neurological:     Mental Status: She is alert.      BP 122/84   Pulse 82   Temp 98.5 F (36.9 C)   Resp 16   Ht 5' 4.5" (1.638 m)   Wt 211 lb (95.7 kg)   LMP 09/07/2009   SpO2 96%   BMI 35.66 kg/m  Wt Readings from Last 3 Encounters:  02/18/22 211 lb (95.7 kg)  01/21/22 211 lb 8 oz (95.9 kg)  11/05/21 235 lb 2 oz (106.7 kg)     Health Maintenance Due  Topic Date Due   Hepatitis C Screening  Never done   TETANUS/TDAP  Never done   Zoster Vaccines- Shingrix (1 of 2) Never done   COVID-19 Vaccine (3 - Pfizer risk series) 09/09/2019    There are no preventive care reminders to display for this patient.  Lab Results  Component Value Date   TSH 2.240 01/21/2021   Lab Results  Component Value Date   WBC 11.0 (H) 06/11/2021   HGB 14.5 06/11/2021   HCT 43.4 06/11/2021   MCV 94.6 06/11/2021   PLT 289 06/11/2021   Lab Results  Component Value Date   NA 138 07/01/2021   K 4.6 07/01/2021   CO2 20 07/01/2021   GLUCOSE 93 07/01/2021   BUN 11 07/01/2021   CREATININE 0.85 07/01/2021   BILITOT <0.2 07/01/2021    ALKPHOS 101 07/01/2021   AST 33 07/01/2021   ALT 56 (H) 07/01/2021   PROT 6.7 07/01/2021   ALBUMIN 4.1 07/01/2021   CALCIUM 9.4 07/01/2021   ANIONGAP 8 05/04/2020   EGFR 84 07/01/2021   Lab Results  Component Value Date   CHOL 197 01/21/2022   Lab Results  Component Value Date   HDL 44 01/21/2022   Lab Results  Component Value Date   LDLCALC 126 (H) 01/21/2022   Lab Results  Component Value Date   TRIG 154 (H) 01/21/2022   Lab Results  Component Value Date   CHOLHDL 4.5 (H) 01/21/2022   Lab Results  Component Value Date   HGBA1C 5.7 (H) 01/21/2022      Assessment & Plan:   Problem List Items Addressed This Visit       Respiratory   Allergic rhinitis    Recommend adding on daily flonase       Acute nasopharyngitis    Prescription given for augmentin 875/125 mg po bid for ten days. Pt to continue tylenol/ibuprofen prn sinus pain. Continue with humidifier prn and steam showers recommended as well. instructed If no symptom improvement in 48 hours please f/u       Relevant Medications   predniSONE (DELTASONE) 20 MG tablet   amoxicillin-clavulanate (AUGMENTIN) 875-125 MG tablet     Musculoskeletal and Integument   Bunion, left -  Primary     Other   Bronchospasm, acute    Rx prednisone 40 mg once daily for five days       Relevant Medications   predniSONE (DELTASONE) 20 MG tablet   amoxicillin-clavulanate (AUGMENTIN) 875-125 MG tablet   Fibromyalgia    Chronic pain, referral placed for pain management. Continue for now gabapentin 300 mg bid Cont f/u with rheumatology as scheduled.      Relevant Medications   predniSONE (DELTASONE) 20 MG tablet   Other Relevant Orders   Ambulatory referral to Pain Clinic    Meds ordered this encounter  Medications   predniSONE (DELTASONE) 20 MG tablet    Sig: Take two tablets once daily for five days    Dispense:  10 tablet    Refill:  0    Order Specific Question:   Supervising Provider    Answer:   BEDSOLE,  AMY E [[id number]]   amoxicillin-clavulanate (AUGMENTIN) 875-125 MG tablet    Sig: Take 1 tablet by mouth 2 (two) times daily.    Dispense:  20 tablet    Refill:  0    Order Specific Question:   Supervising Provider    Answer:   Diona Browner, AMY E [[id number]]    Follow-up: Return in about 6 months (around 08/19/2022) for f/u prediabetes.    Eugenia Pancoast, FNP

## 2022-02-18 NOTE — Assessment & Plan Note (Signed)
Chronic pain, referral placed for pain management. Continue for now gabapentin 300 mg bid Cont f/u with rheumatology as scheduled.

## 2022-02-18 NOTE — Assessment & Plan Note (Signed)
Recommend adding on daily flonase

## 2022-02-28 ENCOUNTER — Encounter: Payer: Self-pay | Admitting: Family

## 2022-03-18 ENCOUNTER — Encounter: Payer: Self-pay | Admitting: Adult Health

## 2022-03-18 ENCOUNTER — Ambulatory Visit (INDEPENDENT_AMBULATORY_CARE_PROVIDER_SITE_OTHER): Payer: 59 | Admitting: Adult Health

## 2022-03-18 DIAGNOSIS — F4000 Agoraphobia, unspecified: Secondary | ICD-10-CM | POA: Diagnosis not present

## 2022-03-18 DIAGNOSIS — F331 Major depressive disorder, recurrent, moderate: Secondary | ICD-10-CM | POA: Diagnosis not present

## 2022-03-18 DIAGNOSIS — F411 Generalized anxiety disorder: Secondary | ICD-10-CM

## 2022-03-18 DIAGNOSIS — F422 Mixed obsessional thoughts and acts: Secondary | ICD-10-CM

## 2022-03-18 DIAGNOSIS — F410 Panic disorder [episodic paroxysmal anxiety] without agoraphobia: Secondary | ICD-10-CM

## 2022-03-18 MED ORDER — VENLAFAXINE HCL ER 150 MG PO CP24: 150.0000 mg | ORAL_CAPSULE | Freq: Every day | ORAL | 5 refills | Status: DC

## 2022-03-18 MED ORDER — VENLAFAXINE HCL ER 75 MG PO CP24: 75.0000 mg | ORAL_CAPSULE | Freq: Every day | ORAL | 5 refills | Status: DC

## 2022-03-18 NOTE — Progress Notes (Signed)
Patricia Bauer [id number] Mar 24, 1971 51 y.o.  Subjective:   Patient ID:  Patricia Bauer is a 51 y.o. (DOB [birth year]) female.  Chief Complaint: No chief complaint on file.   HPI Patricia Bauer presents to the office today for follow-up of MDD, GAD, insomnia, panic attacks, and agoraphobia.   Describes mood today as "ok". Pleasant. Tearful at times. Mood symptoms - reports decreased depression - not as bad as it was. Feels anxious and stressed. Denies irritability. Increased pain issues - trying acupuncture - trying to establish a pain management provider. Reports worry and rumination - thinking about the "worst case scenarios. "had an abnormal pap smear last year and didn't know". Reports panic attacks. Reports skin picking - "the worst I'm stressed the more I pick". Feels like the Effexor is helpful. Family doing well. Varying interest and motivation. Taking medications as prescribed.  Energy levels there -but "pain" holding her back". Active, does not have a regular exercise routine. Enjoys some usual interests and activities. Single. Divorced. Lives with daughter (64) 2 children - 4 and 24 months. Spending time with family and friends. Appetite adequate. Reporting weight loss with Mounjaro. Sleeps better some nights than others. Averages 7 to 8 hours. Focus and concentration difficulties. Completing tasks. Managing aspects of household. Works full time for [employer] - day shift..  Denies SI or HI. Denies AH or VH. Denies self harm - skin picking. Therapist - Marya Fossa - x 2 years.  Diagnosed with Fibromylagia and RA - awaiting a referral for pain management. Diagnosed with tachyphylaxis.  Previous medication trials:  Celexa, Zoloft - flat, Wellbutrin   GAD-7    Flowsheet Row Office Visit from 01/21/2022 in [place] at [facility] Visit from 02/23/2021 in [facility] Visit from 01/21/2021 in [place] at [facility]  Visit from 10/10/2019 in [place] at [facility] Visit from 03/28/2019 in [place]  Total GAD-7 Score '10 5 6 10 7      '$ PHQ2-9    [place] Visit from 01/21/2022 in [place] at [place] Visit from 02/23/2021 in [facility] Visit from 01/21/2021 in [place] at [facility] Visit from 12/31/2019 in [place] at [place] from 10/10/2019 in [place] at [place]  PHQ-2 Total Score '2 2 2 2 '$ 0  PHQ-9 Total Score '10 15 14 8 8      '$ Flowsheet Row ED from 06/22/2021 in [place] No Risk        Review of Systems:  Review of Systems  Musculoskeletal:  Negative for gait problem.  Neurological:  Negative for tremors.  Psychiatric/Behavioral:         Please refer to HPI    Medications: I have reviewed the patient's current medications.  Current Outpatient Medications  Medication Sig Dispense Refill   ALPRAZolam (XANAX) 0.25 MG tablet TAKE 1 TABLET(0.25 MG) BY MOUTH DAILY AS NEEDED FOR ANXIETY OR SLEEP 30 tablet 2   amLODipine (NORVASC) 10 MG tablet TAKE 1 TABLET(10 MG) BY MOUTH DAILY 90 tablet 0   amoxicillin-clavulanate (AUGMENTIN) 875-125 MG tablet Take 1 tablet by mouth 2 (two) times daily. 20 tablet 0   benzonatate (TESSALON) 200 MG capsule Take 1 capsule (200 mg total) by mouth 3 (three) times daily as needed for cough. 30 capsule 2   famotidine (PEPCID) 20 MG tablet SMARTSIG:1 Tablet(s)  By Mouth Every Evening     gabapentin (NEURONTIN) 300 MG capsule Take 1 capsule (300 mg total) by mouth 2 (two) times daily. 60 capsule 2   pantoprazole (PROTONIX) 40 MG tablet TAKE 1 TABLET(40 MG) BY MOUTH DAILY 30 TO 60 MINUTES BEFORE FIRST MEAL OF THE DAY 30 tablet 2   predniSONE (DELTASONE) 20 MG tablet Take two tablets once daily for five days 10 tablet 0   SUMAtriptan (IMITREX) 50 MG tablet TAKE 1  TABLET MAY REPEAT IN 2 HOURS IF HEADACHE PERSISTS OR RECURS. DON'T USE MORE THAN TWO IN 24HRS 10 tablet 2   tirzepatide (MOUNJARO) 5 MG/0.5ML Pen Inject 5 mg into the skin once a week. 2 mL 1   traMADol (ULTRAM) 50 MG tablet TAKE 1 TABLET(50 MG) BY MOUTH EVERY 4 HOURS FOR UP TO 5 DAYS AS NEEDED FOR COUGH OR PAIN 30 tablet 0   Turmeric Curcumin 500 MG CAPS Take 1 capsule by mouth daily.     venlafaxine XR (EFFEXOR XR) 150 MG 24 hr capsule Take 1 capsule (150 mg total) by mouth daily with breakfast. 30 capsule 5   venlafaxine XR (EFFEXOR XR) 75 MG 24 hr capsule Take 1 capsule (75 mg total) by mouth daily with breakfast. 30 capsule 5   Vitamin D-Vitamin K (VITAMIN K2-VITAMIN D3 PO) Take by mouth.     No current facility-administered medications for this visit.    Medication Side Effects: None  Allergies:  Allergies  Allergen Reactions   Ibuprofen Swelling    Bilateral swelling of ankles and feet   Diflucan [Fluconazole] Other (See Comments)    Pelvic pain   Onion Other (See Comments)    migraines    Topamax [Topiramate] Other (See Comments)    Reaction:  Body aches     Omeprazole Other (See Comments)    Abdominal pain    Hydrocodone Anxiety, Palpitations and Other (See Comments)    Reaction:  Migraines     Past Medical History:  Diagnosis Date   Anemia    Anxiety    Colon polyp    Depression    GERD (gastroesophageal reflux disease)    Headache    migraines    History of hysterectomy    still has both ovaries and cervix   History of migraine headaches    History of ovarian cyst    Hypertension    PONV (postoperative nausea and vomiting)     Past Medical History, Surgical history, Social history, and Family history were reviewed and updated as appropriate.   Please see review of systems for further details on the patient's review from today.   Objective:   Physical Exam:  LMP 09/07/2009   Physical Exam Constitutional:      General: She is not in acute  distress. Musculoskeletal:        General: No deformity.  Neurological:     Mental Status: She is alert and oriented to person, place, and time.     Coordination: Coordination normal.  Psychiatric:        Attention and Perception: Attention and perception normal. She does not perceive auditory or visual hallucinations.        Mood and Affect: Mood normal. Mood is not anxious or depressed. Affect is not labile, blunt, angry or inappropriate.        Speech: Speech normal.        Behavior: Behavior normal.        Thought Content: Thought content normal. Thought content is  not paranoid or delusional. Thought content does not include homicidal or suicidal ideation. Thought content does not include homicidal or suicidal plan.        Cognition and Memory: Cognition and memory normal.        Judgment: Judgment normal.     Comments: Insight intact     Lab Review:     Component Value Date/Time   NA 138 07/01/2021 1305   NA 139 03/13/2013 2022   K 4.6 07/01/2021 1305   K 3.6 03/13/2013 2022   CL 100 07/01/2021 1305   CL 106 03/13/2013 2022   CO2 20 07/01/2021 1305   CO2 26 03/13/2013 2022   GLUCOSE 93 07/01/2021 1305   GLUCOSE 110 (H) 05/04/2020 0503   GLUCOSE 94 03/13/2013 2022   BUN 11 07/01/2021 1305   BUN 11 03/13/2013 2022   CREATININE 0.85 07/01/2021 1305   CREATININE 1.05 (H) 05/24/2019 1359   CREATININE 0.80 03/13/2013 2022   CALCIUM 9.4 07/01/2021 1305   CALCIUM 8.7 03/13/2013 2022   PROT 6.7 07/01/2021 1305   ALBUMIN 4.1 07/01/2021 1305   AST 33 07/01/2021 1305   AST 27 05/24/2019 1359   ALT 56 (H) 07/01/2021 1305   ALT 32 05/24/2019 1359   ALKPHOS 101 07/01/2021 1305   BILITOT <0.2 07/01/2021 1305   BILITOT 0.3 05/24/2019 1359   GFRNONAA >60 05/04/2020 0503   GFRNONAA >60 05/24/2019 1359   GFRNONAA >60 03/13/2013 2022   GFRAA 89 12/31/2019 1451   GFRAA >60 05/24/2019 1359   GFRAA >60 03/13/2013 2022       Component Value Date/Time   WBC 11.0 (H) 06/11/2021  1331   WBC 8.1 05/14/2021 1429   RBC 4.55 06/11/2021 1332   RBC 4.59 06/11/2021 1331   HGB 14.5 06/11/2021 1331   HGB 11.9 01/17/2019 1519   HGB 12.0 01/17/2019 1519   HCT 43.4 06/11/2021 1331   HCT 36.1 01/17/2019 1519   HCT 36.5 01/17/2019 1519   PLT 289 06/11/2021 1331   PLT 333 01/17/2019 1519   PLT 338 01/17/2019 1519   MCV 94.6 06/11/2021 1331   MCV 82 01/17/2019 1519   MCV 83 01/17/2019 1519   MCV 85 03/13/2013 2022   MCH 31.6 06/11/2021 1331   MCHC 33.4 06/11/2021 1331   RDW 12.1 06/11/2021 1331   RDW 14.1 01/17/2019 1519   RDW 14.2 01/17/2019 1519   RDW 13.4 03/13/2013 2022   LYMPHSABS 2.4 06/11/2021 1331   LYMPHSABS 2.8 01/17/2019 1519   LYMPHSABS 3.0 01/17/2019 1519   MONOABS 0.6 06/11/2021 1331   EOSABS 0.1 06/11/2021 1331   EOSABS 0.2 01/17/2019 1519   EOSABS 0.2 01/17/2019 1519   BASOSABS 0.1 06/11/2021 1331   BASOSABS 0.0 01/17/2019 1519   BASOSABS 0.0 01/17/2019 1519    No results found for: "POCLITH", "LITHIUM"   No results found for: "PHENYTOIN", "PHENOBARB", "VALPROATE", "CBMZ"   .res Assessment: Plan:    Plan:  PDMP reviewed  Effexor XR 150 and '75mg'$  every morning Xanax 0.'25mg'$  as needed - takes once a week  RTC 6 months  Patient advised to contact office with any questions, adverse effects, or acute worsening in signs and symptoms.  Discussed potential benefits, risk, and side effects of benzodiazepines to include potential risk of tolerance and dependence, as well as possible drowsiness.  Advised patient not to drive if experiencing drowsiness and to take lowest possible effective dose to minimize risk of dependence and tolerance.   Diagnoses and all orders for this  visit:  Major depressive disorder, recurrent episode, moderate (HCC) -     venlafaxine XR (EFFEXOR XR) 75 MG 24 hr capsule; Take 1 capsule (75 mg total) by mouth daily with breakfast. -     venlafaxine XR (EFFEXOR XR) 150 MG 24 hr capsule; Take 1 capsule (150 mg total) by  mouth daily with breakfast.  GAD (generalized anxiety disorder) -     venlafaxine XR (EFFEXOR XR) 75 MG 24 hr capsule; Take 1 capsule (75 mg total) by mouth daily with breakfast. -     venlafaxine XR (EFFEXOR XR) 150 MG 24 hr capsule; Take 1 capsule (150 mg total) by mouth daily with breakfast.  Agoraphobia -     venlafaxine XR (EFFEXOR XR) 150 MG 24 hr capsule; Take 1 capsule (150 mg total) by mouth daily with breakfast.  Panic attacks -     venlafaxine XR (EFFEXOR XR) 150 MG 24 hr capsule; Take 1 capsule (150 mg total) by mouth daily with breakfast.  Mixed obsessional thoughts and acts -     venlafaxine XR (EFFEXOR XR) 150 MG 24 hr capsule; Take 1 capsule (150 mg total) by mouth daily with breakfast.  Panic disorder without agoraphobia     Please see After Visit Summary for patient specific instructions.  Future Appointments  Date Time Provider [place]  03/29/2022  2:10 PM Anyanwu, Sallyanne Havers, MD [facility]  06/10/2022  1:30 PM [facility] None  06/10/2022  1:45 PM Celso Amy, NP [facility] None    No orders of the defined types were placed in this encounter.   -------------------------------

## 2022-03-29 ENCOUNTER — Encounter: Payer: Self-pay | Admitting: Obstetrics & Gynecology

## 2022-03-29 ENCOUNTER — Other Ambulatory Visit (HOSPITAL_COMMUNITY)
Admission: RE | Admit: 2022-03-29 | Discharge: 2022-03-29 | Disposition: A | Discharge status: Home / Self Care | Payer: 59 | Source: Ambulatory Visit | Attending: Obstetrics & Gynecology | Admitting: Obstetrics & Gynecology

## 2022-03-29 ENCOUNTER — Ambulatory Visit (INDEPENDENT_AMBULATORY_CARE_PROVIDER_SITE_OTHER): Payer: 59 | Admitting: Obstetrics & Gynecology

## 2022-03-29 VITALS — BP 150/96 | HR 112 | Ht 64.0 in | Wt 202.0 lb

## 2022-03-29 DIAGNOSIS — N760 Acute vaginitis: Secondary | ICD-10-CM | POA: Insufficient documentation

## 2022-03-29 DIAGNOSIS — Z124 Encounter for screening for malignant neoplasm of cervix: Secondary | ICD-10-CM | POA: Diagnosis not present

## 2022-03-29 DIAGNOSIS — Z90711 Acquired absence of uterus with remaining cervical stump: Secondary | ICD-10-CM

## 2022-03-29 DIAGNOSIS — N950 Postmenopausal bleeding: Secondary | ICD-10-CM

## 2022-03-29 DIAGNOSIS — Z01411 Encounter for gynecological examination (general) (routine) with abnormal findings: Secondary | ICD-10-CM

## 2022-03-29 NOTE — Progress Notes (Signed)
GYNECOLOGY NEW PATIENT/ANNUAL PREVENTATIVE CARE ENCOUNTER NOTE  History:     Patricia Bauer is a 51 y.o. [phone] female here to establish care and for a routine annual gynecologic exam.  History of laparoscopic supracervical hysterectomy in 2011 for fibroids, adenomyosis as per patient, had benign pathology. She desired to retain her cervix. She had no issues until last year when she started to have intermittent bleeding.  Last episode was about 3 months ago and was like a light menstrual period, associated with cramping and "phantom uterus" pain.   Also has itchy, white vaginal discharge for the last two weeks. Currently has no bleeding, but has the problematic vulvar and vaginal irritation and discharge.  Denies abnormal vaginal bleeding, discharge, pelvic pain, problems with intercourse or other gynecologic concerns.  Of note, patient is postmenopausal, had Jerome of 34 on 02/23/2021.    Gynecologic History Patient's last menstrual period was 09/07/2009. Contraception: status post hysterectomy Last Pap: 02/23/2022. Result was ASCUS with negative HPV Last Mammogram: 07/26/2021.  Result was normal Last Colonoscopy: 2019.  Result was normal  Obstetric History OB History  Gravida Para Term Preterm AB Living  '4 1 1   3 1  '$ SAB IAB Ectopic Multiple Live Births  '2 1     1    '$ # Outcome Date GA Lbr Len/2nd Weight Sex Delivery Anes PTL Lv  4 Term 11/13/95    F Vag-Spont   LIV  3 IAB           2 SAB           1 SAB             Past Medical History:  Diagnosis Date   Anemia    Anxiety    Colon polyp    Depression    GERD (gastroesophageal reflux disease)    Headache    migraines    History of hysterectomy    still has both ovaries and cervix   History of migraine headaches    History of ovarian cyst    Hypertension    PONV (postoperative nausea and vomiting)     Past Surgical History:  Procedure Laterality Date   COLONOSCOPY     knee athroscopy  08/2010   LT   LAPAROSCOPIC  SUPRACERVICAL HYSTERECTOMY  08/2009   Supra cervical, still with ovaries and cervix, took out uterus   PARTIAL KNEE ARTHROPLASTY Left 02/10/2014   Procedure: LEFT MEDIAL UNICOMPARTMENTAL KNEE ARTHROPLASTY;  Surgeon: Gearlean Alf, MD;  Location: [facility];  Service: Orthopedics;  Laterality: Left;   UPPER GI ENDOSCOPY     WISDOM TOOTH EXTRACTION  1995    Current Outpatient Medications on File Prior to Visit  Medication Sig Dispense Refill   ALPRAZolam (XANAX) 0.25 MG tablet TAKE 1 TABLET(0.25 MG) BY MOUTH DAILY AS NEEDED FOR ANXIETY OR SLEEP 30 tablet 2   amLODipine (NORVASC) 10 MG tablet TAKE 1 TABLET(10 MG) BY MOUTH DAILY 90 tablet 0   famotidine (PEPCID) 20 MG tablet SMARTSIG:1 Tablet(s) By Mouth Every Evening     gabapentin (NEURONTIN) 300 MG capsule Take 1 capsule (300 mg total) by mouth 2 (two) times daily. 60 capsule 2   pantoprazole (PROTONIX) 40 MG tablet TAKE 1 TABLET(40 MG) BY MOUTH DAILY 30 TO 60 MINUTES BEFORE FIRST MEAL OF THE DAY 30 tablet 2   SUMAtriptan (IMITREX) 50 MG tablet TAKE 1 TABLET MAY REPEAT IN 2 HOURS IF HEADACHE PERSISTS OR RECURS. DON'T USE MORE THAN TWO IN  24HRS 10 tablet 2   tirzepatide (MOUNJARO) 5 MG/0.5ML Pen Inject 5 mg into the skin once a week. 2 mL 1   traMADol (ULTRAM) 50 MG tablet TAKE 1 TABLET(50 MG) BY MOUTH EVERY 4 HOURS FOR UP TO 5 DAYS AS NEEDED FOR COUGH OR PAIN 30 tablet 0   Turmeric Curcumin 500 MG CAPS Take 1 capsule by mouth daily.     venlafaxine XR (EFFEXOR XR) 150 MG 24 hr capsule Take 1 capsule (150 mg total) by mouth daily with breakfast. 30 capsule 5   venlafaxine XR (EFFEXOR XR) 75 MG 24 hr capsule Take 1 capsule (75 mg total) by mouth daily with breakfast. 30 capsule 5   Vitamin D-Vitamin K (VITAMIN K2-VITAMIN D3 PO) Take by mouth.     amoxicillin-clavulanate (AUGMENTIN) 875-125 MG tablet Take 1 tablet by mouth 2 (two) times daily. 20 tablet 0   benzonatate (TESSALON) 200 MG capsule Take 1 capsule (200 mg total) by mouth 3 (three) times  daily as needed for cough. 30 capsule 2   predniSONE (DELTASONE) 20 MG tablet Take two tablets once daily for five days 10 tablet 0   No current facility-administered medications on file prior to visit.    Allergies  Allergen Reactions   Ibuprofen Swelling    Bilateral swelling of ankles and feet   Diflucan [Fluconazole] Other (See Comments)    Pelvic pain   Onion Other (See Comments)    migraines    Topamax [Topiramate] Other (See Comments)    Reaction:  Body aches     Omeprazole Other (See Comments)    Abdominal pain    Hydrocodone Anxiety, Palpitations and Other (See Comments)    Reaction:  Migraines     Social History:  reports that she has never smoked. She has never used smokeless tobacco. She reports that she does not currently use alcohol. She reports that she does not use drugs.  Family History  Problem Relation Age of Onset   Stroke Father    Heart attack Father    Hyperlipidemia Mother    Hypertension Mother    Breast cancer Maternal Aunt 75   Colon cancer Neg Hx    Colon polyps Neg Hx    Kidney disease Neg Hx    Gallbladder disease Neg Hx    Esophageal cancer Neg Hx    Alcohol abuse Neg Hx    Drug abuse Neg Hx    Depression Neg Hx    Bipolar disorder Neg Hx    Anxiety disorder Neg Hx    Schizophrenia Neg Hx    Suicidality Neg Hx     The following portions of the patient's history were reviewed and updated as appropriate: allergies, current medications, past family history, past medical history, past social history, past surgical history and problem list.  Review of Systems Pertinent items noted in HPI and remainder of comprehensive ROS otherwise negative.  Physical Exam:  BP (!) 150/96   Pulse (!) 112   Ht '5\' 4"'$  (1.626 m)   Wt 202 lb (91.6 kg)   LMP 09/07/2009   BMI 34.67 kg/m  CONSTITUTIONAL: Well-developed, well-nourished female in no acute distress.  HENT:  Normocephalic, atraumatic, External right and left ear normal.  EYES: Conjunctivae  and EOM are normal. Pupils are equal, round, and reactive to light. No scleral icterus.  NECK: Normal range of motion, supple, no masses.  Normal thyroid.  SKIN: Skin is warm and dry. No rash noted. Not diaphoretic. No erythema. No pallor. MUSCULOSKELETAL:  Normal range of motion. No tenderness.  No cyanosis, clubbing, or edema. NEUROLOGIC: Alert and oriented to person, place, and time. Normal reflexes, muscle tone coordination.  PSYCHIATRIC: Normal mood and affect. Normal behavior. Normal judgment and thought content. CARDIOVASCULAR: Normal heart rate noted, regular rhythm RESPIRATORY: Clear to auscultation bilaterally. Effort and breath sounds normal, no problems with respiration noted. BREASTS: Symmetric in size. No masses, tenderness, skin changes, nipple drainage, or lymphadenopathy bilaterally. Performed in the presence of a chaperone. ABDOMEN: Soft, no distention noted.  No tenderness, rebound or guarding.  PELVIC: Normal appearing external genitalia and urethral meatus; normal appearing vaginal mucosa and cervix noted.  Cervix with prominent ectropion. Thin, white abnormal vaginal discharge noted, testing sample obtained.  Pap smear obtained.  Normal cervix palpated on exam, unable to palpate adnexa, no cervical or adnexal tenderness.  Performed in the presence of a chaperone.   ENDOCERVICAL CURETTAGE PROCEDURE     The indications for endocervical curettage were reviewed.   Risks of the biopsy including cramping, bleeding, infection, inadequate specimen and need for additional procedures were discussed. The patient states she understands and agrees to undergo procedure today. Consent was signed. Time out was performed. Chaperone was present during entire procedure. During the pelvic exam, the endocervical curette was used on the endocervical canal, and a moderate amount of tissue was obtained with two endocervical brushes and sent to pathology. The instruments were removed from the patient's  vagina. Minimal bleeding from the cervix was noted. The patient tolerated the procedure well. Routine post-procedure instructions were given to the patient.     Assessment and Plan:     1. Vulvovaginitis Proper vulvar hygiene emphasized: discussed avoidance of perfumed soaps, detergents, lotions and any type of douches; in addition to wearing cotton underwear and no underwear at night.  Also recommended cleaning front to back, voiding and cleaning up after any activity that causes sweating - Cervicovaginal ancillary only( [place])  2. Cervical cancer screening - Cytology - PAP done today, will follow up results and manage accordingly.  3. S/P laparoscopic supracervical hysterectomy 4. Postmenopausal bleeding Evaluation of postmenopausal bleeding from her cervix done today, will follow up results and manage accordingly. If benign, and bleeding continues, consider cryoablation of the cervical canal vs excision (almost like a small LEEP).  Trachelectomy would be last resort if bleeding not responsive to these efforts. Bleeding precautions reviewed with patient.  - Cytology - PAP - Cervicovaginal ancillary only( ) - Surgical pathology - US PELVIC COMPLETE WITH TRANSVAGINAL; Future  Mammogram and colon cancer screening are up to date. Routine preventative health maintenance measures emphasized. Please refer to After Visit Summary for other counseling recommendations.       Verita Schneiders, MD, [place] for [employer] [facility], [place]

## 2022-03-31 ENCOUNTER — Other Ambulatory Visit: Payer: Self-pay | Admitting: *Deleted

## 2022-03-31 LAB — CERVICOVAGINAL ANCILLARY ONLY
Bacterial Vaginitis (gardnerella): POSITIVE — AB
Candida Glabrata: NEGATIVE
Candida Vaginitis: POSITIVE — AB
Chlamydia: NEGATIVE
Comment: NEGATIVE
Comment: NEGATIVE
Comment: NEGATIVE
Comment: NEGATIVE
Comment: NEGATIVE
Comment: NORMAL
Neisseria Gonorrhea: NEGATIVE
Trichomonas: NEGATIVE

## 2022-03-31 MED ORDER — METRONIDAZOLE 500 MG PO TABS: 500.0000 mg | ORAL_TABLET | Freq: Two times a day (BID) | ORAL | 0 refills | Status: AC

## 2022-04-01 ENCOUNTER — Other Ambulatory Visit: Payer: Self-pay | Admitting: *Deleted

## 2022-04-01 LAB — SURGICAL PATHOLOGY

## 2022-04-01 MED ORDER — NYSTATIN 100000 UNIT/GM EX CREA: TOPICAL_CREAM | CUTANEOUS | 0 refills | Status: DC

## 2022-04-01 NOTE — Progress Notes (Signed)
Erroneous

## 2022-04-04 ENCOUNTER — Ambulatory Visit
Admission: RE | Admit: 2022-04-04 | Discharge: 2022-04-04 | Disposition: A | Discharge status: Home / Self Care | Payer: 59 | Source: Ambulatory Visit | Attending: Obstetrics & Gynecology | Admitting: Obstetrics & Gynecology

## 2022-04-04 DIAGNOSIS — Z90711 Acquired absence of uterus with remaining cervical stump: Secondary | ICD-10-CM | POA: Diagnosis present

## 2022-04-04 DIAGNOSIS — N950 Postmenopausal bleeding: Secondary | ICD-10-CM | POA: Diagnosis present

## 2022-04-05 LAB — CYTOLOGY - PAP
Comment: NEGATIVE
Diagnosis: NEGATIVE
Diagnosis: REACTIVE
High risk HPV: NEGATIVE

## 2022-04-06 ENCOUNTER — Encounter: Payer: Self-pay | Admitting: Obstetrics & Gynecology

## 2022-04-06 ENCOUNTER — Encounter: Payer: Self-pay | Admitting: Family

## 2022-04-15 ENCOUNTER — Encounter: Payer: Self-pay | Admitting: Family

## 2022-04-15 ENCOUNTER — Other Ambulatory Visit: Payer: Self-pay

## 2022-04-15 DIAGNOSIS — I10 Essential (primary) hypertension: Secondary | ICD-10-CM

## 2022-04-15 MED ORDER — AMLODIPINE BESYLATE 10 MG PO TABS: ORAL_TABLET | ORAL | 0 refills | Status: DC

## 2022-05-10 ENCOUNTER — Ambulatory Visit: Payer: 59 | Admitting: Obstetrics & Gynecology

## 2022-05-10 ENCOUNTER — Encounter: Payer: Self-pay | Admitting: Obstetrics & Gynecology

## 2022-05-10 VITALS — BP 154/95 | HR 97 | Wt 204.4 lb

## 2022-05-10 DIAGNOSIS — N950 Postmenopausal bleeding: Secondary | ICD-10-CM | POA: Diagnosis not present

## 2022-05-10 DIAGNOSIS — Z90711 Acquired absence of uterus with remaining cervical stump: Secondary | ICD-10-CM

## 2022-05-10 NOTE — Progress Notes (Signed)
GYNECOLOGY OFFICE VISIT NOTE  History:   Patricia Bauer is a 52 y.o. [phone] here today for follow up ultrasound after evaluation for postmenopausal bleeding.  History of laparoscopic supracervical hysterectomy in 2011 for fibroids, adenomyosis as per patient, had benign pathology.   Evaluation on 03/29/2022 showed cervix with prominent ectropion, inflamed by bacterial and yeast vaginitis.  She was treated for this, had no bleeding since then.  She denies any current  abnormal vaginal discharge, bleeding, pelvic pain or other concerns.    Past Medical History:  Diagnosis Date   Anemia    Anxiety    Colon polyp    Depression    GERD (gastroesophageal reflux disease)    Headache    migraines    History of hysterectomy    still has both ovaries and cervix   History of migraine headaches    History of ovarian cyst    Hypertension    PONV (postoperative nausea and vomiting)     Past Surgical History:  Procedure Laterality Date   COLONOSCOPY     knee athroscopy  08/2010   LT   LAPAROSCOPIC SUPRACERVICAL HYSTERECTOMY  08/2009   Supra cervical, still with ovaries and cervix, took out uterus   PARTIAL KNEE ARTHROPLASTY Left 02/10/2014   Procedure: LEFT MEDIAL UNICOMPARTMENTAL KNEE ARTHROPLASTY;  Surgeon: Gearlean Alf, MD;  Location: [facility];  Service: Orthopedics;  Laterality: Left;   UPPER GI ENDOSCOPY     WISDOM TOOTH EXTRACTION  1995    The following portions of the patient's history were reviewed and updated as appropriate: allergies, current medications, past family history, past medical history, past social history, past surgical history and problem list.   Health Maintenance:  Normal pap and negative HRHPV on 03/29/2022.  Normal mammogram on 08/19/2021.   Review of Systems:  Pertinent items noted in HPI and remainder of comprehensive ROS otherwise negative.  Physical Exam:  BP (!) 154/95   Pulse 97   Wt 204 lb 6.4 oz (92.7 kg)   LMP 09/07/2009   BMI 35.09 kg/m   CONSTITUTIONAL: Well-developed, well-nourished female in no acute distress.  HEENT:  Normocephalic, atraumatic. External right and left ear normal. No scleral icterus.  NECK: Normal range of motion, supple, no masses noted on observation SKIN: No rash noted. Not diaphoretic. No erythema. No pallor. MUSCULOSKELETAL: Normal range of motion. No edema noted. NEUROLOGIC: Alert and oriented to person, place, and time. Normal muscle tone coordination. No cranial nerve deficit noted. PSYCHIATRIC: Normal mood and affect. Normal behavior. Normal judgment and thought content. CARDIOVASCULAR: Normal heart rate noted RESPIRATORY: Effort and breath sounds normal, no problems with respiration noted ABDOMEN: No masses noted. No other overt distention noted.   PELVIC: Deferred  Labs and Imaging US PELVIC COMPLETE WITH TRANSVAGINAL  Result Date: 04/04/2022 CLINICAL DATA:  Postmenopausal bleeding, Prior supracervical hysterectomy EXAM: TRANSABDOMINAL AND TRANSVAGINAL ULTRASOUND OF PELVIS TECHNIQUE: Both transabdominal and transvaginal ultrasound examinations of the pelvis were performed. Transabdominal technique was performed for global imaging of the pelvis including uterus, ovaries, adnexal regions, and pelvic cul-de-sac. It was necessary to proceed with endovaginal exam following the transabdominal exam to visualize the cervix and ovaries. COMPARISON:  Two hundred twenty 2017 FINDINGS: Uterus Surgically absent Endometrium Surgically absent Right ovary Measurements: 1.4 x 1.2 x 1.3 cm = volume: 1.1 mL. Normal morphology without mass Left ovary Not visualized, likely obscured by bowel Other findings Retained cervix noted, containing multiple nabothian cysts. No additional mass. No free pelvic fluid or adnexal masses.  IMPRESSION: Post hysterectomy with nonvisualization of LEFT ovary. Multiple nabothian cysts in cervix. Remainder of exam unremarkable. Electronically Signed   By: Lavonia Dana M.D.   On: 04/04/2022 16:39        Assessment and Plan:     1. Postmenopausal bleeding 2. S/P laparoscopic supracervical hysterectomy Bleeding was likely due to her vaginitis, that has been treated. If bleeding recurs or continues, will consider cryoablation of the cervical canal vs excision (almost like a small LEEP). Trachelectomy would be last resort if bleeding not responsive to these efforts. Bleeding precautions reviewed with patient.   Routine preventative health maintenance measures emphasized. Please refer to After Visit Summary for other counseling recommendations.   Return for any gynecologic concerns.    I spent 20 minutes dedicated to the care of this patient including pre-visit review of records, face to face time with the patient discussing her conditions and treatments and post visit orders.    Verita Schneiders, MD, [place] for [employer], [place]

## 2022-05-13 ENCOUNTER — Encounter: Payer: Self-pay | Admitting: Family

## 2022-05-18 ENCOUNTER — Encounter: Payer: Self-pay | Admitting: Family

## 2022-05-20 ENCOUNTER — Other Ambulatory Visit: Payer: Self-pay | Admitting: Internal Medicine

## 2022-05-20 ENCOUNTER — Ambulatory Visit: Payer: 59 | Admitting: Family

## 2022-05-23 ENCOUNTER — Encounter: Payer: Self-pay | Admitting: Family

## 2022-05-23 ENCOUNTER — Ambulatory Visit: Payer: 59 | Admitting: Family

## 2022-05-23 VITALS — BP 122/82 | HR 85 | Temp 98.5°F | Ht 64.0 in | Wt 202.6 lb

## 2022-05-23 DIAGNOSIS — F410 Panic disorder [episodic paroxysmal anxiety] without agoraphobia: Secondary | ICD-10-CM

## 2022-05-23 DIAGNOSIS — R413 Other amnesia: Secondary | ICD-10-CM

## 2022-05-23 DIAGNOSIS — R7303 Prediabetes: Secondary | ICD-10-CM

## 2022-05-23 DIAGNOSIS — R4701 Aphasia: Secondary | ICD-10-CM

## 2022-05-23 DIAGNOSIS — H9193 Unspecified hearing loss, bilateral: Secondary | ICD-10-CM

## 2022-05-23 DIAGNOSIS — F332 Major depressive disorder, recurrent severe without psychotic features: Secondary | ICD-10-CM

## 2022-05-23 DIAGNOSIS — M797 Fibromyalgia: Secondary | ICD-10-CM

## 2022-05-23 DIAGNOSIS — Z9189 Other specified personal risk factors, not elsewhere classified: Secondary | ICD-10-CM | POA: Diagnosis not present

## 2022-05-23 DIAGNOSIS — F411 Generalized anxiety disorder: Secondary | ICD-10-CM

## 2022-05-23 DIAGNOSIS — Z6834 Body mass index (BMI) 34.0-34.9, adult: Secondary | ICD-10-CM

## 2022-05-23 DIAGNOSIS — M069 Rheumatoid arthritis, unspecified: Secondary | ICD-10-CM | POA: Insufficient documentation

## 2022-05-23 DIAGNOSIS — E6609 Other obesity due to excess calories: Secondary | ICD-10-CM

## 2022-05-23 NOTE — Assessment & Plan Note (Signed)
Pt advised of the following: Work on a diabetic diet, try to incorporate exercise at least 20-30 a day for 3 days a week or more.   

## 2022-05-23 NOTE — Assessment & Plan Note (Signed)
MMSE in office, normal findings.  Referral to neuropsych as may be anxiety related.  Strong fmh dementia  Labs today to r/o tsh, b12 abn

## 2022-05-23 NOTE — Patient Instructions (Signed)
  A referral was placed today for ENT for hearing loss as well as neurospych for eval on memory loss.  Please let us know if you have not heard back within 2 weeks about the referral.  Stop by the lab prior to leaving today. I will notify you of your results once received.    Regards,   Eugenia Pancoast FNP-C

## 2022-05-23 NOTE — Progress Notes (Signed)
Established Patient Office Visit  Subjective:      CC:  Chief Complaint  Patient presents with   Hearing Problem    Over a year.   Memory Loss    Over 1 year.    HPI: Patricia Bauer is a 53 y.o. female presenting on 05/23/2022 for Hearing Problem (Over a year.) and Memory Loss (Over 1 year.) .   New complaints: Hearing concerns: finds she is asking 'what' a lot more than usual. Daughter keeps telling her to get her hearing checked for well over a year. Sometimes can hear things, but seems muffled at times and hard to understand what she is hearing. Finds reading lips often. Even harder to hear if something noisy going on in the background. Denies tinnitus.   Memory concerns: finds she is often forgetful, where she puts things, but also sometimes in the am upon awakening hard to figure out the day or time of day without looking a calendar. Will ask the same question often within five minutes of asking the same question.   Mom and maternal aunts with dementia.   Mgm with alzheimer's.   Also states she smells things others do not smell such as smoke or   Cigarette smoke. She is a non smoker and no one smokes  In the home.   Does notice stress, but not necessarily anything new. Worried about fibromyalgia 'fog' which she wonders if this is contributing. She is starting physical therapy starting tomorrow    05/23/2022   12:09 PM 01/21/2022    8:51 AM 02/23/2021    3:34 PM 01/21/2021    2:48 PM  GAD 7 : Generalized Anxiety Score  Nervous, Anxious, on Edge '1 2 1 1  '$ Control/stop worrying '1 1 1 1  '$ Worry too much - different things '1 2 1 1  '$ Trouble relaxing '1 2 1 '$ 0  Restless 0 1 0 0  Easily annoyed or irritable '1 2 1 1  '$ Afraid - awful might happen 0 0 0 2  Total GAD 7 Score '5 10 5 6  '$ Anxiety Difficulty Somewhat difficult Somewhat difficult Somewhat difficult Very difficult   Does see psychologist and also psychiatrist for major depression.     Social history:  Relevant past  medical, surgical, family and social history reviewed and updated as indicated. Interim medical history since our last visit reviewed.  Allergies and medications reviewed and updated.  DATA REVIEWED: CHART IN EPIC     ROS: Negative unless specifically indicated above in HPI.    Current Outpatient Medications:    ALPRAZolam (XANAX) 0.25 MG tablet, TAKE 1 TABLET(0.25 MG) BY MOUTH DAILY AS NEEDED FOR ANXIETY OR SLEEP, Disp: 30 tablet, Rfl: 2   amLODipine (NORVASC) 10 MG tablet, TAKE 1 TABLET(10 MG) BY MOUTH DAILY, Disp: 90 tablet, Rfl: 0   famotidine (PEPCID) 20 MG tablet, SMARTSIG:1 Tablet(s) By Mouth Every Evening, Disp: , Rfl:    gabapentin (NEURONTIN) 300 MG capsule, Take 1 capsule (300 mg total) by mouth 2 (two) times daily., Disp: 60 capsule, Rfl: 2   nystatin cream (MYCOSTATIN), One full applicator (one gram) inserted vaginally for fourteen nights, Disp: 15 g, Rfl: 0   pantoprazole (PROTONIX) 40 MG tablet, TAKE 1 TABLET(40 MG) BY MOUTH DAILY 30 TO 60 MINUTES BEFORE FIRST MEAL OF THE DAY, Disp: 30 tablet, Rfl: 2   SUMAtriptan (IMITREX) 50 MG tablet, TAKE 1 TABLET MAY REPEAT IN 2 HOURS IF HEADACHE PERSISTS OR RECURS. DON'T USE MORE THAN TWO IN 24HRS,  Disp: 10 tablet, Rfl: 2   traMADol (ULTRAM) 50 MG tablet, TAKE 1 TABLET(50 MG) BY MOUTH EVERY 4 HOURS FOR UP TO 5 DAYS AS NEEDED FOR COUGH OR PAIN, Disp: 30 tablet, Rfl: 0   venlafaxine XR (EFFEXOR XR) 150 MG 24 hr capsule, Take 1 capsule (150 mg total) by mouth daily with breakfast., Disp: 30 capsule, Rfl: 5   venlafaxine XR (EFFEXOR XR) 75 MG 24 hr capsule, Take 1 capsule (75 mg total) by mouth daily with breakfast., Disp: 30 capsule, Rfl: 5   Vitamin D-Vitamin K (VITAMIN K2-VITAMIN D3 PO), Take by mouth., Disp: , Rfl:       Objective:    BP 122/82   Pulse 85   Temp 98.5 F (36.9 C) (Temporal)   Ht '5\' 4"'$  (1.626 m)   Wt 202 lb 9.6 oz (91.9 kg)   LMP 09/07/2009   SpO2 98%   BMI 34.78 kg/m   Wt Readings from Last 3 Encounters:   05/23/22 202 lb 9.6 oz (91.9 kg)  05/10/22 204 lb 6.4 oz (92.7 kg)  03/29/22 202 lb (91.6 kg)    Physical Exam Constitutional:      General: She is not in acute distress.    Appearance: Normal appearance. She is normal weight. She is not ill-appearing, toxic-appearing or diaphoretic.  HENT:     Head: Normocephalic.     Right Ear: Tympanic membrane, ear canal and external ear normal. Decreased hearing noted. There is no impacted cerumen.     Left Ear: Tympanic membrane, ear canal and external ear normal. Decreased hearing noted. There is no impacted cerumen.  Cardiovascular:     Rate and Rhythm: Normal rate.  Pulmonary:     Effort: Pulmonary effort is normal.  Musculoskeletal:        General: Normal range of motion.  Neurological:     General: No focal deficit present.     Mental Status: She is alert and oriented to person, place, and time. Mental status is at baseline.     Cranial Nerves: Cranial nerves 2-12 are intact. No cranial nerve deficit or facial asymmetry.     Motor: Motor function is intact. No weakness, atrophy or seizure activity.     Coordination: Coordination is intact. Romberg sign negative. Finger-Nose-Finger Test normal.     Gait: Gait is intact. Gait normal.  Psychiatric:        Mood and Affect: Mood normal.        Behavior: Behavior normal.        Thought Content: Thought content normal.        Judgment: Judgment normal.            Assessment & Plan:  Memory loss Assessment & Plan: MMSE in office, normal findings.  Referral to neuropsych as may be anxiety related.  Strong fmh dementia  Labs today to r/o tsh, b12 abn   Orders: -     Ambulatory referral to Neuropsychology -     TSH -     Vitamin B12  Prediabetes Assessment & Plan: Pt advised of the following: Work on a diabetic diet, try to incorporate exercise at least 20-30 a day for 3 days a week or more.    Orders: -     Hemoglobin A1c  At risk for diabetes mellitus -     Hemoglobin  A1c  Dysnomia -     Ambulatory referral to Neuropsychology  GAD (generalized anxiety disorder) -     Ambulatory referral to Neuropsychology  Panic disorder without agoraphobia -     Ambulatory referral to Neuropsychology  Major depressive disorder, recurrent, severe without psychotic features ([place]) -     Ambulatory referral to Neuropsychology  Fibromyalgia -     Ambulatory referral to Neuropsychology  Class 1 obesity due to excess calories with serious comorbidity and body mass index (BMI) of 34.0 to 34.9 in adult  Bilateral hearing loss, unspecified hearing loss type -     Ambulatory referral to ENT     Return in about 6 months (around 11/21/2022) for f/u prediabetes.  Eugenia Pancoast, MSN, APRN, FNP-C [place]

## 2022-05-24 LAB — TSH: TSH: 2.11 u[IU]/mL (ref 0.450–4.500)

## 2022-05-24 LAB — HEMOGLOBIN A1C
Est. average glucose Bld gHb Est-mCnc: 117 mg/dL
Hgb A1c MFr Bld: 5.7 % — ABNORMAL HIGH (ref 4.8–5.6)

## 2022-05-24 LAB — VITAMIN B12: Vitamin B-12: 592 pg/mL (ref 232–1245)

## 2022-05-25 ENCOUNTER — Encounter: Payer: Self-pay | Admitting: *Deleted

## 2022-06-01 ENCOUNTER — Encounter: Payer: Self-pay | Admitting: Family

## 2022-06-06 NOTE — Telephone Encounter (Signed)
Vaccine information received and updated in patient chart. NCIR was updated by the pharmacy already.

## 2022-06-10 ENCOUNTER — Inpatient Hospital Stay: Payer: 59 | Attending: Hematology & Oncology

## 2022-06-10 ENCOUNTER — Telehealth: Payer: Self-pay | Admitting: Adult Health

## 2022-06-10 ENCOUNTER — Encounter: Payer: Self-pay | Admitting: Family

## 2022-06-10 ENCOUNTER — Inpatient Hospital Stay: Payer: 59 | Admitting: Family

## 2022-06-10 VITALS — BP 136/85 | HR 90 | Temp 98.3°F | Resp 17 | Wt 206.0 lb

## 2022-06-10 DIAGNOSIS — D509 Iron deficiency anemia, unspecified: Secondary | ICD-10-CM | POA: Diagnosis present

## 2022-06-10 DIAGNOSIS — K909 Intestinal malabsorption, unspecified: Secondary | ICD-10-CM | POA: Insufficient documentation

## 2022-06-10 DIAGNOSIS — D508 Other iron deficiency anemias: Secondary | ICD-10-CM

## 2022-06-10 LAB — CBC WITH DIFFERENTIAL (CANCER CENTER ONLY)
Abs Immature Granulocytes: 0.09 10*3/uL — ABNORMAL HIGH (ref 0.00–0.07)
Basophils Absolute: 0 10*3/uL (ref 0.0–0.1)
Basophils Relative: 0 %
Eosinophils Absolute: 0.1 10*3/uL (ref 0.0–0.5)
Eosinophils Relative: 1 %
HCT: 43.1 % (ref 36.0–46.0)
Hemoglobin: 14.1 g/dL (ref 12.0–15.0)
Immature Granulocytes: 1 %
Lymphocytes Relative: 32 %
Lymphs Abs: 2.8 10*3/uL (ref 0.7–4.0)
MCH: 30.5 pg (ref 26.0–34.0)
MCHC: 32.7 g/dL (ref 30.0–36.0)
MCV: 93.3 fL (ref 80.0–100.0)
Monocytes Absolute: 0.4 10*3/uL (ref 0.1–1.0)
Monocytes Relative: 4 %
Neutro Abs: 5.3 10*3/uL (ref 1.7–7.7)
Neutrophils Relative %: 62 %
Platelet Count: 329 10*3/uL (ref 150–400)
RBC: 4.62 MIL/uL (ref 3.87–5.11)
RDW: 12.2 % (ref 11.5–15.5)
WBC Count: 8.8 10*3/uL (ref 4.0–10.5)
nRBC: 0 % (ref 0.0–0.2)

## 2022-06-10 LAB — RETICULOCYTES
Immature Retic Fract: 12.2 % (ref 2.3–15.9)
RBC.: 4.64 MIL/uL (ref 3.87–5.11)
Retic Count, Absolute: 92.3 10*3/uL (ref 19.0–186.0)
Retic Ct Pct: 2 % (ref 0.4–3.1)

## 2022-06-10 LAB — FERRITIN: Ferritin: 172 ng/mL (ref 11–307)

## 2022-06-10 NOTE — Progress Notes (Signed)
Hematology and Oncology Follow Up Visit  Patricia Bauer [id number] 1970/06/08 52 y.o. 06/10/2022   Principle Diagnosis:  Iron deficiency anemia secondary to malabsorption   Current Therapy:        IV iron as indicated   Interim History:  Patricia Bauer is here today for follow-up. She is symptomatic with fatigue.  She has remote history of hysterectomy but has recently had some bleeding from the cervix. She states her gynecologist work-up was negative and that bleeding was felt to be benign.  No other blood loss noted.  No bruising or petechiae.  No fever, chills, n/v, cough, rash, dizziness, SOB, chest pain, palpitations, abdominal pain or changes in bowel or bladder habits.  No swelling, tenderness, numbness or tingling in her extremities.  No falls or syncope reported.  Appetite and hydration are good. Weight is stable at 206 lbs.   ECOG Performance Status: 1 - Symptomatic but completely ambulatory  Medications:  Allergies as of 06/10/2022       Reactions   Ibuprofen Swelling   Bilateral swelling of ankles and feet   Diflucan [fluconazole] Other (See Comments)   Pelvic pain   Onion Other (See Comments)   migraines    Topamax [topiramate] Other (See Comments)   Reaction:  Body aches    Omeprazole Other (See Comments)   Abdominal pain    Hydrocodone Anxiety, Palpitations, Other (See Comments)   Reaction:  Migraines         Medication List        Accurate as of June 10, 2022  1:44 PM. If you have any questions, ask your nurse or doctor.          ALPRAZolam 0.25 MG tablet Commonly known as: XANAX TAKE 1 TABLET(0.25 MG) BY MOUTH DAILY AS NEEDED FOR ANXIETY OR SLEEP   amLODipine 10 MG tablet Commonly known as: NORVASC TAKE 1 TABLET(10 MG) BY MOUTH DAILY   famotidine 20 MG tablet Commonly known as: PEPCID SMARTSIG:1 Tablet(s) By Mouth Every Evening   gabapentin 300 MG capsule Commonly known as: Neurontin Take 1 capsule (300 mg total) by mouth 2 (two)  times daily.   nystatin cream Commonly known as: MYCOSTATIN One full applicator (one gram) inserted vaginally for fourteen nights   pantoprazole 40 MG tablet Commonly known as: PROTONIX TAKE 1 TABLET(40 MG) BY MOUTH DAILY 30 TO 60 MINUTES BEFORE FIRST MEAL OF THE DAY   SUMAtriptan 50 MG tablet Commonly known as: IMITREX TAKE 1 TABLET MAY REPEAT IN 2 HOURS IF HEADACHE PERSISTS OR RECURS. DON'T USE MORE THAN TWO IN 24HRS   traMADol 50 MG tablet Commonly known as: ULTRAM TAKE 1 TABLET(50 MG) BY MOUTH EVERY 4 HOURS FOR UP TO 5 DAYS AS NEEDED FOR COUGH OR PAIN   venlafaxine XR 75 MG 24 hr capsule Commonly known as: Effexor XR Take 1 capsule (75 mg total) by mouth daily with breakfast.   venlafaxine XR 150 MG 24 hr capsule Commonly known as: Effexor XR Take 1 capsule (150 mg total) by mouth daily with breakfast.   VITAMIN K2-VITAMIN D3 PO Take by mouth.        Allergies:  Allergies  Allergen Reactions   Ibuprofen Swelling    Bilateral swelling of ankles and feet   Diflucan [Fluconazole] Other (See Comments)    Pelvic pain   Onion Other (See Comments)    migraines    Topamax [Topiramate] Other (See Comments)    Reaction:  Body aches     Omeprazole Other (  See Comments)    Abdominal pain    Hydrocodone Anxiety, Palpitations and Other (See Comments)    Reaction:  Migraines     Past Medical History, Surgical history, Social history, and Family History were reviewed and updated.  Review of Systems: All other 10 point review of systems is negative.   Physical Exam:  vitals were not taken for this visit.   Wt Readings from Last 3 Encounters:  05/23/22 202 lb 9.6 oz (91.9 kg)  05/10/22 204 lb 6.4 oz (92.7 kg)  03/29/22 202 lb (91.6 kg)    Ocular: Sclerae unicteric, pupils equal, round and reactive to light Ear-nose-throat: Oropharynx clear, dentition fair Lymphatic: No cervical or supraclavicular adenopathy Lungs no rales or rhonchi, good excursion  bilaterally Heart regular rate and rhythm, no murmur appreciated Abd soft, nontender, positive bowel sounds MSK no focal spinal tenderness, no joint edema Neuro: non-focal, well-oriented, appropriate affect Breasts: Deferred   Lab Results  Component Value Date   WBC 11.0 (H) 06/11/2021   HGB 14.5 06/11/2021   HCT 43.4 06/11/2021   MCV 94.6 06/11/2021   PLT 289 06/11/2021   Lab Results  Component Value Date   FERRITIN 310 (H) 06/11/2021   IRON 90 06/11/2021   TIBC 372 06/11/2021   UIBC 282 06/11/2021   IRONPCTSAT 24 06/11/2021   Lab Results  Component Value Date   RETICCTPCT 2.5 06/11/2021   RBC 4.55 06/11/2021   No results found for: "KPAFRELGTCHN", "LAMBDASER", "KAPLAMBRATIO" No results found for: "IGGSERUM", "IGA", "IGMSERUM" No results found for: "TOTALPROTELP", "ALBUMINELP", "A1GS", "A2GS", "BETS", "BETA2SER", "GAMS", "MSPIKE", "SPEI"   Chemistry      Component Value Date/Time   NA 138 07/01/2021 1305   NA 139 03/13/2013 2022   K 4.6 07/01/2021 1305   K 3.6 03/13/2013 2022   CL 100 07/01/2021 1305   CL 106 03/13/2013 2022   CO2 20 07/01/2021 1305   CO2 26 03/13/2013 2022   BUN 11 07/01/2021 1305   BUN 11 03/13/2013 2022   CREATININE 0.85 07/01/2021 1305   CREATININE 1.05 (H) 05/24/2019 1359   CREATININE 0.80 03/13/2013 2022   GLU 94 11/23/2017 0000      Component Value Date/Time   CALCIUM 9.4 07/01/2021 1305   CALCIUM 8.7 03/13/2013 2022   ALKPHOS 101 07/01/2021 1305   AST 33 07/01/2021 1305   AST 27 05/24/2019 1359   ALT 56 (H) 07/01/2021 1305   ALT 32 05/24/2019 1359   BILITOT <0.2 07/01/2021 1305   BILITOT 0.3 05/24/2019 1359       Impression and Plan: Patricia Bauer is a very pleasant 52 yo caucasian female with iron deficiency anemia secondary to malabsorption.  Iron studies are pending. We will get her set up for IV iron if needed.  Follow-up in 1 year.   Lottie Dawson, NP 2/23/20241:44 PM

## 2022-06-10 NOTE — Telephone Encounter (Signed)
Patient called in for refill for Alprazolam 0.'25mg'$  to be sent to [place] Ph: [phone] Appt  6/7

## 2022-06-12 ENCOUNTER — Other Ambulatory Visit: Payer: Self-pay

## 2022-06-12 DIAGNOSIS — F410 Panic disorder [episodic paroxysmal anxiety] without agoraphobia: Secondary | ICD-10-CM

## 2022-06-12 NOTE — Telephone Encounter (Signed)
Pended.

## 2022-06-13 LAB — IRON AND IRON BINDING CAPACITY (CC-WL,HP ONLY)
Iron: 78 ug/dL (ref 28–170)
Saturation Ratios: 21 % (ref 10.4–31.8)
TIBC: 368 ug/dL (ref 250–450)
UIBC: 290 ug/dL (ref 148–442)

## 2022-06-13 MED ORDER — ALPRAZOLAM 0.25 MG PO TABS: ORAL_TABLET | ORAL | 2 refills | Status: DC

## 2022-07-10 ENCOUNTER — Other Ambulatory Visit: Payer: Self-pay | Admitting: Family

## 2022-07-10 DIAGNOSIS — I10 Essential (primary) hypertension: Secondary | ICD-10-CM

## 2022-08-24 ENCOUNTER — Ambulatory Visit (INDEPENDENT_AMBULATORY_CARE_PROVIDER_SITE_OTHER): Payer: 59

## 2022-08-24 ENCOUNTER — Ambulatory Visit: Payer: 59 | Admitting: Podiatry

## 2022-08-24 DIAGNOSIS — M2011 Hallux valgus (acquired), right foot: Secondary | ICD-10-CM | POA: Diagnosis not present

## 2022-08-24 DIAGNOSIS — M722 Plantar fascial fibromatosis: Secondary | ICD-10-CM

## 2022-08-24 MED ORDER — MELOXICAM 15 MG PO TABS: 15.0000 mg | ORAL_TABLET | Freq: Every day | ORAL | 3 refills | Status: DC

## 2022-08-24 MED ORDER — METHYLPREDNISOLONE 4 MG PO TBPK: ORAL_TABLET | ORAL | 0 refills | Status: DC

## 2022-08-24 NOTE — Progress Notes (Signed)
She presents today chief complaint of pain to her right foot.  She states the dorsal lateral aspect of the foot right over here she points to the fourth fifth tarsometatarsal joint area.  States this started about 3 weeks ago pain is 3 out of 10.  She has a history of Planter fasciitis x-rays I will perform today.  Objective: Vital signs stable alert oriented x 3 pulses are palpable.  Hallux valgus deformity right foot.  She has moderate severe pain on palpation of the dorsal fourth fifth tarsometatarsal joint.  She also has tenderness on palpation medial calcaneal tubercle of the right heel.  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  I injected the foot today 20 mg Kenalog 5 mg Marcaine to the point of maximal tenderness medial aspect of the right heel I will start her on methylprednisolone to be followed by meloxicam.  And dispensed a plantar fascial brace.  Discussed appropriate shoe gear stretching exercise ice therapy sugar modifications.

## 2022-09-09 ENCOUNTER — Other Ambulatory Visit: Payer: Self-pay | Admitting: Internal Medicine

## 2022-09-10 ENCOUNTER — Other Ambulatory Visit: Payer: Self-pay | Admitting: Internal Medicine

## 2022-09-23 ENCOUNTER — Encounter: Payer: Self-pay | Admitting: Adult Health

## 2022-09-23 ENCOUNTER — Ambulatory Visit (INDEPENDENT_AMBULATORY_CARE_PROVIDER_SITE_OTHER): Payer: 59 | Admitting: Adult Health

## 2022-09-23 DIAGNOSIS — F4000 Agoraphobia, unspecified: Secondary | ICD-10-CM

## 2022-09-23 DIAGNOSIS — F422 Mixed obsessional thoughts and acts: Secondary | ICD-10-CM

## 2022-09-23 DIAGNOSIS — F411 Generalized anxiety disorder: Secondary | ICD-10-CM

## 2022-09-23 DIAGNOSIS — F331 Major depressive disorder, recurrent, moderate: Secondary | ICD-10-CM

## 2022-09-23 DIAGNOSIS — F410 Panic disorder [episodic paroxysmal anxiety] without agoraphobia: Secondary | ICD-10-CM

## 2022-09-23 MED ORDER — VENLAFAXINE HCL ER 150 MG PO CP24
150.0000 mg | ORAL_CAPSULE | Freq: Every day | ORAL | 5 refills | Status: DC
Start: 2022-09-23 — End: 2023-04-04

## 2022-09-23 MED ORDER — VENLAFAXINE HCL ER 75 MG PO CP24: 75.0000 mg | ORAL_CAPSULE | Freq: Every day | ORAL | 5 refills | Status: DC

## 2022-09-23 MED ORDER — BUSPIRONE HCL 10 MG PO TABS: 10.0000 mg | ORAL_TABLET | Freq: Three times a day (TID) | ORAL | 5 refills | Status: DC

## 2022-09-23 MED ORDER — ALPRAZOLAM 0.25 MG PO TABS
ORAL_TABLET | ORAL | 2 refills | Status: DC
Start: 2022-09-23 — End: 2023-04-04

## 2022-09-23 NOTE — Progress Notes (Signed)
Patricia Bauer Bauer 1971/02/19 52 y.o.  Subjective:   Patient ID:  Patricia Bauer is a 51 y.o. (DOB 1970-05-14) female.  Chief Complaint: No chief complaint on file.   HPI Patricia Bauer presents to the office today for follow-up of MDD, GAD, insomnia, panic attacks, and agoraphobia.   Describes mood today as "ok". Pleasant. Tearful at times. Mood symptoms - reports depression - "more manageable". Feels like she is able to identify when mood starts to decline. Feels anxious all the time - constant - situational stressors. Denies irritability. Reports panic attacks. Reports worry, rumination, and over thinking. Reports always thinking of worst case scenarios. Reports obsessive acts - skin picking. Reporting chronic pain issues. Stating "the past month has been stressful". Mother in hospital - has dementia and is waiting to be placed. Feels like medications are helpful, but may need to be adjusted with current mood symptoms. Family doing well. Varying interest and motivation. Taking medications as prescribed.  Energy levels there -but "pain" holding her back". Active, does not have a regular exercise routine. Enjoys some usual interests and activities. Single. Divorced. Lives with daughter (73) 2 children. Spending time with family and friends. Appetite adequate. Reporting weight gain with inactivity due to Plantar fascitis.  Sleeps better some nights than others. Averages 7 to 8 hours. Reports recent sleep study - diagnosed with mild sleep apnea. Focus and concentration difficulties. Completing tasks. Managing aspects of household. Works full time for [employer] - remote - day shift. Denies SI or HI. Denies AH or VH. Denies self harm - skin picking. Therapist - Elisha Ponder - x 2 years. Diagnosed with Fibromylagia and RA - awaiting a referral for pain management. Diagnosed with tachyphylaxis.  Previous medication trials:  Celexa, Zoloft - flat, Wellbutrin   GAD-7    Flowsheet  Row Office Visit from 05/23/2022 in [facility] Visit from 01/21/2022 in [facility] Office Visit from 02/23/2021 in [facility] Office Visit from 01/21/2021 in [facility] Visit from 10/10/2019 in [facility] at [facility]  Total GAD-7 Score 5 10 5 6 10       Mini-Mental    Flowsheet Row Office Visit from 05/23/2022 in [facility]  Total Score (max 30 points ) 30      PHQ2-9    Flowsheet Row Office Visit from 05/23/2022 in [facility] Visit from 01/21/2022 in [facility] at [employer] Office Visit from 02/23/2021 in [facility] Office Visit from 01/21/2021 in [facility] at [facility] Visit from 12/31/2019 in [facility] at [place]  PHQ-2 Total Score 2 2 2 2 2   PHQ-9 Total Score 12 10 15 14 8       Flowsheet Row ED from 06/22/2021 in [facility] Emergency Department at [facility]  C-SSRS RISK CATEGORY No Risk        Review of Systems:  Review of Systems  Musculoskeletal:  Negative for gait problem.  Neurological:  Negative for tremors.  Psychiatric/Behavioral:         Please refer to HPI    Medications: I have reviewed the patient's current medications.  Current Outpatient Medications  Medication Sig Dispense Refill   ALPRAZolam (XANAX) 0.25 MG tablet TAKE 1 TABLET(0.25 MG) BY MOUTH DAILY AS NEEDED FOR ANXIETY OR SLEEP  30 tablet 2   amLODipine (NORVASC) 10 MG tablet TAKE 1 TABLET(10 MG) BY MOUTH DAILY 90 tablet 0   famotidine (PEPCID) 20 MG tablet TAKE 1 TABLET BY MOUTH AFTER SUPPER 30 tablet 11   gabapentin (NEURONTIN) 300 MG capsule Take 1 capsule (300 mg total) by mouth 2 (two) times daily. 60 capsule 2   meloxicam (MOBIC) 15 MG tablet Take 1 tablet (15 mg total) by mouth  daily. 30 tablet 3   methylPREDNISolone (MEDROL DOSEPAK) 4 MG TBPK tablet 6 day dose pack - take as directed 21 tablet 0   nystatin cream (MYCOSTATIN) One full applicator (one gram) inserted vaginally for fourteen nights 15 g 0   pantoprazole (PROTONIX) 40 MG tablet TAKE 1 TABLET(40 MG) BY MOUTH DAILY 30 TO 60 MINUTES BEFORE FIRST MEAL OF THE DAY 30 tablet 2   SUMAtriptan (IMITREX) 50 MG tablet TAKE 1 TABLET MAY REPEAT IN 2 HOURS IF HEADACHE PERSISTS OR RECURS. DON'T USE MORE THAN TWO IN 24HRS 10 tablet 2   traMADol (ULTRAM) 50 MG tablet TAKE 1 TABLET(50 MG) BY MOUTH EVERY 4 HOURS FOR UP TO 5 DAYS AS NEEDED FOR COUGH OR PAIN 30 tablet 0   venlafaxine XR (EFFEXOR XR) 150 MG 24 hr capsule Take 1 capsule (150 mg total) by mouth daily with breakfast. 30 capsule 5   venlafaxine XR (EFFEXOR XR) 75 MG 24 hr capsule Take 1 capsule (75 mg total) by mouth daily with breakfast. 30 capsule 5   Vitamin D-Vitamin K (VITAMIN K2-VITAMIN D3 PO) Take by mouth.     No current facility-administered medications for this visit.    Medication Side Effects: None  Allergies:  Allergies  Allergen Reactions   Ibuprofen Swelling    Bilateral swelling of ankles and feet   Diflucan [Fluconazole] Other (See Comments)    Pelvic pain   Onion Other (See Comments)    migraines    Topamax [Topiramate] Other (See Comments)    Reaction:  Body aches     Omeprazole Other (See Comments)    Abdominal pain    Hydrocodone Anxiety, Palpitations and Other (See Comments)    Reaction:  Migraines     Past Medical History:  Diagnosis Date   Anemia    Anxiety    Colon polyp    Depression    GERD (gastroesophageal reflux disease)    Headache    migraines    History of hysterectomy    still has both ovaries and cervix   History of migraine headaches    History of ovarian cyst    Hypertension    PONV (postoperative nausea and vomiting)     Past Medical History, Surgical history, Social history, and Family history were  reviewed and updated as appropriate.   Please see review of systems for further details on the patient's review from today.   Objective:   Physical Exam:  LMP 09/07/2009   Physical Exam Constitutional:      General: She is not in acute distress. Musculoskeletal:        General: No deformity.  Neurological:     Mental Status: She is alert and oriented to person, place, and time.     Coordination: Coordination normal.  Psychiatric:        Attention and Perception: Attention and perception normal. She does not perceive auditory or visual hallucinations.        Mood and Affect: Mood normal. Mood is not anxious or depressed. Affect is not labile, blunt, angry or  inappropriate.        Speech: Speech normal.        Behavior: Behavior normal.        Thought Content: Thought content normal. Thought content is not paranoid or delusional. Thought content does not include homicidal or suicidal ideation. Thought content does not include homicidal or suicidal plan.        Cognition and Memory: Cognition and memory normal.        Judgment: Judgment normal.     Comments: Insight intact     Lab Review:     Component Value Date/Time   NA 138 07/01/2021 1305   NA 139 03/13/2013 2022   K 4.6 07/01/2021 1305   K 3.6 03/13/2013 2022   CL 100 07/01/2021 1305   CL 106 03/13/2013 2022   CO2 20 07/01/2021 1305   CO2 26 03/13/2013 2022   GLUCOSE 93 07/01/2021 1305   GLUCOSE 110 (H) 05/04/2020 0503   GLUCOSE 94 03/13/2013 2022   BUN 11 07/01/2021 1305   BUN 11 03/13/2013 2022   CREATININE 0.85 07/01/2021 1305   CREATININE 1.05 (H) 05/24/2019 1359   CREATININE 0.80 03/13/2013 2022   CALCIUM 9.4 07/01/2021 1305   CALCIUM 8.7 03/13/2013 2022   PROT 6.7 07/01/2021 1305   ALBUMIN 4.1 07/01/2021 1305   AST 33 07/01/2021 1305   AST 27 05/24/2019 1359   ALT 56 (H) 07/01/2021 1305   ALT 32 05/24/2019 1359   ALKPHOS 101 07/01/2021 1305   BILITOT <0.2 07/01/2021 1305   BILITOT 0.3 05/24/2019 1359    GFRNONAA >60 05/04/2020 0503   GFRNONAA >60 05/24/2019 1359   GFRNONAA >60 03/13/2013 2022   GFRAA 89 12/31/2019 1451   GFRAA >60 05/24/2019 1359   GFRAA >60 03/13/2013 2022       Component Value Date/Time   WBC 8.8 06/10/2022 1338   WBC 8.1 05/14/2021 1429   RBC 4.64 06/10/2022 1338   RBC 4.62 06/10/2022 1338   HGB 14.1 06/10/2022 1338   HGB 11.9 01/17/2019 1519   HGB 12.0 01/17/2019 1519   HCT 43.1 06/10/2022 1338   HCT 36.1 01/17/2019 1519   HCT 36.5 01/17/2019 1519   PLT 329 06/10/2022 1338   PLT 333 01/17/2019 1519   PLT 338 01/17/2019 1519   MCV 93.3 06/10/2022 1338   MCV 82 01/17/2019 1519   MCV 83 01/17/2019 1519   MCV 85 03/13/2013 2022   MCH 30.5 06/10/2022 1338   MCHC 32.7 06/10/2022 1338   RDW 12.2 06/10/2022 1338   RDW 14.1 01/17/2019 1519   RDW 14.2 01/17/2019 1519   RDW 13.4 03/13/2013 2022   LYMPHSABS 2.8 06/10/2022 1338   LYMPHSABS 2.8 01/17/2019 1519   LYMPHSABS 3.0 01/17/2019 1519   MONOABS 0.4 06/10/2022 1338   EOSABS 0.1 06/10/2022 1338   EOSABS 0.2 01/17/2019 1519   EOSABS 0.2 01/17/2019 1519   BASOSABS 0.0 06/10/2022 1338   BASOSABS 0.0 01/17/2019 1519   BASOSABS 0.0 01/17/2019 1519    No results found for: "POCLITH", "LITHIUM"   No results found for: "PHENYTOIN", "PHENOBARB", "VALPROATE", "CBMZ"   .res Assessment: Plan:    Plan:  PDMP reviewed  Effexor XR 150 and 75mg  every morning Xanax 0.25mg  as needed - takes once a week  Add Buspar 10mg  TID - 1/2 TID x 7 days, then one TID.  RTC 6 months  Patient advised to contact office with any questions, adverse effects, or acute worsening in signs and symptoms.  Discussed potential benefits, risk,  and side effects of benzodiazepines to include potential risk of tolerance and dependence, as well as possible drowsiness.  Advised patient not to drive if experiencing drowsiness and to take lowest possible effective dose to minimize risk of dependence and tolerance.  There are no  diagnoses linked to this encounter.   Please see After Visit Summary for patient specific instructions.  Future Appointments  Date Time Provider Department Center  09/23/2022  1:00 PM Reynalda Canny, Thereasa Solo, NP [facility] None  06/16/2023 12:45 PM [facility] None  06/16/2023  1:00 PM Erenest Blank, NP [facility] None    No orders of the defined types were placed in this encounter.   -------------------------------

## 2022-09-28 ENCOUNTER — Encounter: Payer: 59 | Admitting: Podiatry

## 2022-09-29 ENCOUNTER — Encounter: Payer: Self-pay | Admitting: Podiatry

## 2022-10-04 ENCOUNTER — Other Ambulatory Visit: Payer: Self-pay | Admitting: Family

## 2022-10-04 DIAGNOSIS — I10 Essential (primary) hypertension: Secondary | ICD-10-CM

## 2022-10-28 ENCOUNTER — Ambulatory Visit: Payer: Self-pay | Admitting: Adult Health

## 2022-11-09 ENCOUNTER — Encounter: Payer: Self-pay | Admitting: Family

## 2022-11-09 ENCOUNTER — Telehealth: Payer: Self-pay | Admitting: Family

## 2022-11-09 NOTE — Telephone Encounter (Signed)
FYI: This call has been transferred to Access Nurse. Once the result note has been entered staff can address the message at that time.  Patient called in with the following symptoms:  Red Word: fever 101 Patient called in and stated that she has a fever of 101. She stated that she is experiencing some congestion, coughing, sore throat, and body aches.   Please advise at Mobile [phone] (mobile)  Message is routed to Provider Pool and [facility] Triage

## 2022-11-09 NOTE — Telephone Encounter (Signed)
Okay--will assess at tomorrow morning's visit

## 2022-11-09 NOTE — Telephone Encounter (Signed)
Per appt notes pt already has appt scvheduled with Dr Alphonsus Sias on 11/10/22 at 8:15. Sending note to Dr Alphonsus Sias and Alphonsus Sias pool.

## 2022-11-10 ENCOUNTER — Encounter: Payer: Self-pay | Admitting: Internal Medicine

## 2022-11-10 ENCOUNTER — Ambulatory Visit: Payer: 59 | Admitting: Internal Medicine

## 2022-11-10 VITALS — BP 116/84 | HR 88 | Temp 98.8°F | Ht 64.0 in | Wt 223.0 lb

## 2022-11-10 DIAGNOSIS — R051 Acute cough: Secondary | ICD-10-CM | POA: Diagnosis not present

## 2022-11-10 DIAGNOSIS — U071 COVID-19: Secondary | ICD-10-CM | POA: Insufficient documentation

## 2022-11-10 LAB — POC COVID19 BINAXNOW: SARS Coronavirus 2 Ag: POSITIVE — AB

## 2022-11-10 MED ORDER — PREDNISONE 20 MG PO TABS: 40.0000 mg | ORAL_TABLET | Freq: Every day | ORAL | 0 refills | Status: DC

## 2022-11-10 MED ORDER — NIRMATRELVIR/RITONAVIR (PAXLOVID)TABLET: 3.0000 | ORAL_TABLET | Freq: Two times a day (BID) | ORAL | 0 refills | Status: AC

## 2022-11-10 NOTE — Progress Notes (Signed)
Subjective:    [name], female    DOB: 07/18/1970, 52 y.o.   MRN: [id number]  HPI Here due to respiratory illness  Started 3 days ago with a scratchy throat--4th day of symptoms Got worse 2 days ago--cough, congestion Joint pains, fever Some chills and sweats Some sense of SOB---pulse oximetry is normal 2 days of headache--better today Some ear pain  Tried dayquil, nyquil Tylenol for fever--did help some  Current Outpatient Medications on File Prior to Visit  Medication Sig Dispense Refill   ALPRAZolam (XANAX) 0.25 MG tablet TAKE 1 TABLET(0.25 MG) BY MOUTH DAILY AS NEEDED FOR ANXIETY OR SLEEP 30 tablet 2   amLODipine (NORVASC) 10 MG tablet TAKE 1 TABLET(10 MG) BY MOUTH DAILY 90 tablet 3   busPIRone (BUSPAR) 10 MG tablet Take 1 tablet (10 mg total) by mouth 3 (three) times daily. 90 tablet 5   famotidine (PEPCID) 20 MG tablet TAKE 1 TABLET BY MOUTH AFTER SUPPER 30 tablet 11   gabapentin (NEURONTIN) 300 MG capsule Take 1 capsule (300 mg total) by mouth 2 (two) times daily. 60 capsule 2   meloxicam (MOBIC) 15 MG tablet Take 1 tablet (15 mg total) by mouth daily. 30 tablet 3   methylPREDNISolone (MEDROL DOSEPAK) 4 MG TBPK tablet 6 day dose pack - take as directed 21 tablet 0   nystatin cream (MYCOSTATIN) One full applicator (one gram) inserted vaginally for fourteen nights 15 g 0   SUMAtriptan (IMITREX) 50 MG tablet TAKE 1 TABLET MAY REPEAT IN 2 HOURS IF HEADACHE PERSISTS OR RECURS. DON'T USE MORE THAN TWO IN 24HRS 10 tablet 2   traMADol (ULTRAM) 50 MG tablet TAKE 1 TABLET(50 MG) BY MOUTH EVERY 4 HOURS FOR UP TO 5 DAYS AS NEEDED FOR COUGH OR PAIN 30 tablet 0   venlafaxine XR (EFFEXOR XR) 150 MG 24 hr capsule Take 1 capsule (150 mg total) by mouth daily with breakfast. 30 capsule 5   venlafaxine XR (EFFEXOR XR) 75 MG 24 hr capsule Take 1 capsule (75 mg total) by mouth daily with breakfast. 30 capsule 5   Vitamin D-Vitamin K (VITAMIN K2-VITAMIN D3 PO) Take by mouth.      No current facility-administered medications on file prior to visit.    Allergies  Allergen Reactions   Ibuprofen Swelling    Bilateral swelling of ankles and feet   Diflucan [Fluconazole] Other (See Comments)    Pelvic pain   Onion Other (See Comments)    migraines    Topamax [Topiramate] Other (See Comments)    Reaction:  Body aches     Omeprazole Other (See Comments)    Abdominal pain    Hydrocodone Anxiety, Palpitations and Other (See Comments)    Reaction:  Migraines     Past Medical History:  Diagnosis Date   Anemia    Anxiety    Colon polyp    Depression    GERD (gastroesophageal reflux disease)    Headache    migraines    History of hysterectomy    still has both ovaries and cervix   History of migraine headaches    History of ovarian cyst    Hypertension    PONV (postoperative nausea and vomiting)     Past Surgical History:  Procedure Laterality Date   COLONOSCOPY     knee athroscopy  08/2010   LT   LAPAROSCOPIC SUPRACERVICAL HYSTERECTOMY  08/2009   Supra cervical, still with ovaries and cervix, took out uterus   PARTIAL  KNEE ARTHROPLASTY Left 02/10/2014   Procedure: LEFT MEDIAL UNICOMPARTMENTAL KNEE ARTHROPLASTY;  Surgeon: Loanne Drilling, MD;  Location: [facility];  Service: Orthopedics;  Laterality: Left;   UPPER GI ENDOSCOPY     WISDOM TOOTH EXTRACTION  1995    Family History  Problem Relation Age of Onset   Stroke Father    Heart attack Father    Hyperlipidemia Mother    Hypertension Mother    Breast cancer Maternal Aunt 68   Colon cancer Neg Hx    Colon polyps Neg Hx    Kidney disease Neg Hx    Gallbladder disease Neg Hx    Esophageal cancer Neg Hx    Alcohol abuse Neg Hx    Drug abuse Neg Hx    Depression Neg Hx    Bipolar disorder Neg Hx    Anxiety disorder Neg Hx    Schizophrenia Neg Hx    Suicidality Neg Hx     Social History   Socioeconomic History   Marital status: Single    Spouse name: Not on file   Number of children:  1   Years of education: Not on file   Highest education level: Not on file  Occupational History   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[employer]  Tobacco Use   Smoking status: Never   Smokeless tobacco: Never  Vaping Use   Vaping status: Never Used  Substance and Sexual Activity   Alcohol use: Not Currently    Comment: occassional- once a month has about 2-3 drinks   Drug use: No   Sexual activity: Not Currently  Other Topics Concern   Not on file  Social History Narrative   Not on file   Social Determinants of Health   Financial Resource Strain: Not on file  Food Insecurity: Not on file  Transportation Needs: Not on file  Physical Activity: Not on file  Stress: Not on file  Social Connections: Not on file  Intimate Partner Violence: Not on file   Review of Systems No N/V Able to eat No loss of smell or taste No ill exposures Works from [employer]    Objective:   Physical Exam Constitutional:      Comments: Appears ill but not in distress  HENT:     Right Ear: Tympanic membrane and ear canal normal.     Left Ear: Tympanic membrane and ear canal normal.     Mouth/Throat:     Pharynx: No oropharyngeal exudate or posterior oropharyngeal erythema.  Pulmonary:     Effort: Pulmonary effort is normal.     Breath sounds: Normal breath sounds. No wheezing or rales.  Musculoskeletal:     Cervical back: Neck supple.  Lymphadenopathy:     Cervical: No cervical adenopathy.  Neurological:     Mental Status: She is alert.            Assessment & Plan:

## 2022-11-10 NOTE — Assessment & Plan Note (Signed)
Moderate illness with persistent systemic symptoms (but no clear pneumonia) Would like antiviral Rx Will give prednisone as well for 6 days Full paxlovid--no tramadol/buspirone while on it (hasn't been taking) To ER if worsens--SOB RTW 7/29---mask next week if out (when symptoms resolve)

## 2022-12-05 ENCOUNTER — Ambulatory Visit (INDEPENDENT_AMBULATORY_CARE_PROVIDER_SITE_OTHER): Payer: 59 | Admitting: Podiatry

## 2022-12-05 ENCOUNTER — Ambulatory Visit (INDEPENDENT_AMBULATORY_CARE_PROVIDER_SITE_OTHER): Payer: 59

## 2022-12-05 ENCOUNTER — Encounter: Payer: Self-pay | Admitting: Podiatry

## 2022-12-05 DIAGNOSIS — M722 Plantar fascial fibromatosis: Secondary | ICD-10-CM

## 2022-12-05 DIAGNOSIS — M62171 Other rupture of muscle (nontraumatic), right ankle and foot: Secondary | ICD-10-CM

## 2022-12-05 NOTE — Progress Notes (Signed)
  Subjective:  [name], female    DOB: 1970/12/01,  MRN: [id number]  Chief Complaint  Patient presents with   Foot Pain    "It felt like something popped in the middle of the foot."    52 y.o. female presents with the above complaint. History confirmed with patient.  This happened last week midweek while stepping down  Objective:  Physical Exam: warm, good capillary refill, no trophic changes or ulcerative lesions, normal DP and PT pulses, and normal sensory exam.  Pain and tenderness in plantar right mid arch with palpation and at the heel   Radiographs: Multiple views x-ray of the right foot: Radiographs taken today show no fracture dislocation or joint abnormality Assessment:   1. 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  We discussed she likely has a partial plantar fascial rupture of the medial band.  I recommended immobilization and rest in a cam walker boot.  This was dispensed today.  She will follow-up in 4 weeks to reevaluate.  Recommend rest ice and use of anti-inflammatories as needed.  No follow-ups on file.

## 2022-12-07 ENCOUNTER — Telehealth: Payer: Self-pay | Admitting: Family

## 2022-12-07 NOTE — Telephone Encounter (Signed)
She said she added the dates that are needed. It is for a retro claim when she was sick last month. Forms put in Dr Karle Starch inbox on his desk.

## 2022-12-07 NOTE — Telephone Encounter (Signed)
Patient dropped off document FMLA, to be filled out by provider. Patient requested to send it back via Call Patient to pick up within ASAP. Document is located in providers tray at front office.Please advise at Mobile [phone] (mobile)

## 2022-12-07 NOTE — Telephone Encounter (Signed)
[place] pool please disregard, this was meant for Dr. Karle Starch pool

## 2022-12-08 NOTE — Telephone Encounter (Signed)
Spoke to pt. She was not sure if it needed to be done or not. If so, she will let us know. Copy made for chart and original forms placed up front for pickup. She was made aware of the $29 fee.

## 2022-12-29 ENCOUNTER — Ambulatory Visit: Payer: 59 | Admitting: Family

## 2023-01-04 ENCOUNTER — Encounter: Payer: Self-pay | Admitting: Podiatry

## 2023-01-04 ENCOUNTER — Ambulatory Visit: Payer: 59 | Admitting: Podiatry

## 2023-01-04 DIAGNOSIS — M62171 Other rupture of muscle (nontraumatic), right ankle and foot: Secondary | ICD-10-CM | POA: Diagnosis not present

## 2023-01-04 NOTE — Progress Notes (Signed)
She presents today for follow-up of her plantar fascial rupture from her right heel.  States that is better the boot really helped a lot.  She saw Dr. Lilian Kapur for that rupture her last visit back in August.  Objective: Vital signs are stable she is alert and oriented x 3 she still has some tenderness on palpation and does present with her cam boot today.  The majority of the tenderness is at the medial band plantar fascial insertion site but the band appears to be completely void in that area but loose in the medial longitudinal arch.  Assessment: Severe hallux valgus deformity and plantar fascial rupture which seems to be getting better.  Plan: Recommend that she continue to wear the cam boot for a bit longer and a night splint at bedtime.  I recommended that she get back into her tennis shoe as soon as possible but alternating between the cam boot into tennis shoe as needed.  Follow-up with her in about 1 month if necessary.

## 2023-01-16 ENCOUNTER — Telehealth (INDEPENDENT_AMBULATORY_CARE_PROVIDER_SITE_OTHER): Payer: 59 | Admitting: Adult Health

## 2023-01-16 ENCOUNTER — Encounter: Payer: Self-pay | Admitting: Adult Health

## 2023-01-16 DIAGNOSIS — F331 Major depressive disorder, recurrent, moderate: Secondary | ICD-10-CM

## 2023-01-16 DIAGNOSIS — F411 Generalized anxiety disorder: Secondary | ICD-10-CM | POA: Diagnosis not present

## 2023-01-16 DIAGNOSIS — G4700 Insomnia, unspecified: Secondary | ICD-10-CM

## 2023-01-16 DIAGNOSIS — F410 Panic disorder [episodic paroxysmal anxiety] without agoraphobia: Secondary | ICD-10-CM | POA: Diagnosis not present

## 2023-01-16 DIAGNOSIS — F4000 Agoraphobia, unspecified: Secondary | ICD-10-CM | POA: Diagnosis not present

## 2023-01-16 NOTE — Progress Notes (Signed)
[name] [id number] [birth year] 52 y.o.  Virtual Visit via Video Note  I connected with pt @ on 01/16/23 at  5:00 PM EDT by a video enabled telemedicine application and verified that I am speaking with the correct person using two identifiers.   I discussed the limitations of evaluation and management by telemedicine and the availability of in person appointments. The patient expressed understanding and agreed to proceed.  I discussed the assessment and treatment plan with the patient. The patient was provided an opportunity to ask questions and all were answered. The patient agreed with the plan and demonstrated an understanding of the instructions.   The patient was advised to call back or seek an in-person evaluation if the symptoms worsen or if the condition fails to improve as anticipated.  I provided 25 minutes of non-face-to-face time during this encounter.  The patient was located at home.  The provider was located at [facility].   Patricia Gibbs, NP   Subjective:   Patient ID:  Patricia Bauer is a 52 y.o. (DOB 06-Oct-1970) female.  Chief Complaint: No chief complaint on file.   HPI [name] presents for follow-up of MDD, GAD, insomnia, panic attacks, and agoraphobia.   Describes mood today as "ok". Pleasant. Tearful at times. Mood symptoms - denies current depression, anxiety and irritability. Reports panic attacks. Reports worry, rumination, and over thinking. Reports obsessive acts - skin picking. Reporting chronic pain issues. Stating "I feel like I'm doing ok right now - taking things one hour at a time". Feels like medications are helpful. Family doing well. Varying interest and motivation. Taking medications as prescribed.  Energy levels are "up and down". Active, does not have a regular exercise routine with physical disabilities. Enjoys some usual interests and activities. Single. Divorced. Lives with daughter (74) 2 children. Mother in  memory care unit. Spending time with family and friends. Appetite adequate. Reporting weight gain.  Sleeps better some nights than others. Averages 5 to 6 hours -  diagnosed with mild sleep apnea. Focus and concentration difficulties. Completing tasks. Managing aspects of household. Works full time for [employer] - remote - day shift. Denies SI or HI. Reports suicidal ideation once to twice a month - no plan or intent. Denies AH or VH. Denies self harm - skin picking. Denies substance.  Therapist - Elisha Ponder - x 2 years.  Previous medication trials:  Celexa, Zoloft - flat, Wellbutrin  Review of Systems:  Review of Systems  Musculoskeletal:  Negative for gait problem.  Neurological:  Negative for tremors.  Psychiatric/Behavioral:         Please refer to HPI    Medications: I have reviewed the patient's current medications.  Current Outpatient Medications  Medication Sig Dispense Refill   ALPRAZolam (XANAX) 0.25 MG tablet TAKE 1 TABLET(0.25 MG) BY MOUTH DAILY AS NEEDED FOR ANXIETY OR SLEEP 30 tablet 2   amLODipine (NORVASC) 10 MG tablet TAKE 1 TABLET(10 MG) BY MOUTH DAILY 90 tablet 3   busPIRone (BUSPAR) 10 MG tablet Take 1 tablet (10 mg total) by mouth 3 (three) times daily. 90 tablet 5   famotidine (PEPCID) 20 MG tablet TAKE 1 TABLET BY MOUTH AFTER SUPPER 30 tablet 11   gabapentin (NEURONTIN) 300 MG capsule Take 1 capsule (300 mg total) by mouth 2 (two) times daily. 60 capsule 2   meloxicam (MOBIC) 15 MG tablet Take 1 tablet (15 mg total) by mouth daily. 30 tablet 3   nystatin cream (MYCOSTATIN)  One full applicator (one gram) inserted vaginally for fourteen nights 15 g 0   predniSONE (DELTASONE) 20 MG tablet Take 2 tablets (40 mg total) by mouth daily. For 3 days, then 1 tab daily for 3 days 9 tablet 0   SUMAtriptan (IMITREX) 50 MG tablet TAKE 1 TABLET MAY REPEAT IN 2 HOURS IF HEADACHE PERSISTS OR RECURS. DON'T USE MORE THAN TWO IN 24HRS 10 tablet 2   traMADol (ULTRAM) 50 MG tablet  TAKE 1 TABLET(50 MG) BY MOUTH EVERY 4 HOURS FOR UP TO 5 DAYS AS NEEDED FOR COUGH OR PAIN 30 tablet 0   venlafaxine XR (EFFEXOR XR) 150 MG 24 hr capsule Take 1 capsule (150 mg total) by mouth daily with breakfast. 30 capsule 5   venlafaxine XR (EFFEXOR XR) 75 MG 24 hr capsule Take 1 capsule (75 mg total) by mouth daily with breakfast. 30 capsule 5   Vitamin D-Vitamin K (VITAMIN K2-VITAMIN D3 PO) Take by mouth.     No current facility-administered medications for this visit.    Medication Side Effects: None  Allergies:  Allergies  Allergen Reactions   Ibuprofen Swelling    Bilateral swelling of ankles and feet   Diflucan [Fluconazole] Other (See Comments)    Pelvic pain   Onion Other (See Comments)    migraines    Topamax [Topiramate] Other (See Comments)    Reaction:  Body aches     Omeprazole Other (See Comments)    Abdominal pain    Hydrocodone Anxiety, Palpitations and Other (See Comments)    Reaction:  Migraines     Past Medical History:  Diagnosis Date   Anemia    Anxiety    Colon polyp    Depression    GERD (gastroesophageal reflux disease)    Headache    migraines    History of hysterectomy    still has both ovaries and cervix   History of migraine headaches    History of ovarian cyst    Hypertension    PONV (postoperative nausea and vomiting)     Family History  Problem Relation Age of Onset   Stroke Father    Heart attack Father    Hyperlipidemia Mother    Hypertension Mother    Breast cancer Maternal Aunt 42   Colon cancer Neg Hx    Colon polyps Neg Hx    Kidney disease Neg Hx    Gallbladder disease Neg Hx    Esophageal cancer Neg Hx    Alcohol abuse Neg Hx    Drug abuse Neg Hx    Depression Neg Hx    Bipolar disorder Neg Hx    Anxiety disorder Neg Hx    Schizophrenia Neg Hx    Suicidality Neg Hx     Social History   Socioeconomic History   Marital status: Single    Spouse name: Not on file   Number of children: 1   Years of education:  Not on file   Highest education level: Not on file  Occupational History   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[employer]  Tobacco Use   Smoking status: Never   Smokeless tobacco: Never  Vaping Use   Vaping status: Never Used  Substance and Sexual Activity   Alcohol use: Yes    Comment: occassional- once a month has about 2-3 drinks   Drug use: No   Sexual activity: Not Currently  Other Topics Concern   Not on file  Social History Narrative   Not on file   Social  Determinants of Health   Financial Resource Strain: Not on file  Food Insecurity: Not on file  Transportation Needs: Not on file  Physical Activity: Not on file  Stress: Not on file  Social Connections: Not on file  Intimate Partner Violence: Not on file    Past Medical History, Surgical history, Social history, and Family history were reviewed and updated as appropriate.   Please see review of systems for further details on the patient's review from today.   Objective:   Physical Exam:  LMP 09/07/2009   Physical Exam Constitutional:      General: She is not in acute distress. Musculoskeletal:        General: No deformity.  Neurological:     Mental Status: She is alert and oriented to person, place, and time.     Coordination: Coordination normal.  Psychiatric:        Attention and Perception: Attention and perception normal. She does not perceive auditory or visual hallucinations.        Mood and Affect: Affect is not labile, blunt, angry or inappropriate.        Speech: Speech normal.        Behavior: Behavior normal.        Thought Content: Thought content normal. Thought content is not paranoid or delusional. Thought content does not include homicidal or suicidal ideation. Thought content does not include homicidal or suicidal plan.        Cognition and Memory: Cognition and memory normal.        Judgment: Judgment normal.     Comments: Insight intact     Lab Review:     Component Value Date/Time    NA 138 07/01/2021 1305   NA 139 03/13/2013 2022   K 4.6 07/01/2021 1305   K 3.6 03/13/2013 2022   CL 100 07/01/2021 1305   CL 106 03/13/2013 2022   CO2 20 07/01/2021 1305   CO2 26 03/13/2013 2022   GLUCOSE 93 07/01/2021 1305   GLUCOSE 110 (H) 05/04/2020 0503   GLUCOSE 94 03/13/2013 2022   BUN 11 07/01/2021 1305   BUN 11 03/13/2013 2022   CREATININE 0.85 07/01/2021 1305   CREATININE 1.05 (H) 05/24/2019 1359   CREATININE 0.80 03/13/2013 2022   CALCIUM 9.4 07/01/2021 1305   CALCIUM 8.7 03/13/2013 2022   PROT 6.7 07/01/2021 1305   ALBUMIN 4.1 07/01/2021 1305   AST 33 07/01/2021 1305   AST 27 05/24/2019 1359   ALT 56 (H) 07/01/2021 1305   ALT 32 05/24/2019 1359   ALKPHOS 101 07/01/2021 1305   BILITOT <0.2 07/01/2021 1305   BILITOT 0.3 05/24/2019 1359   GFRNONAA >60 05/04/2020 0503   GFRNONAA >60 05/24/2019 1359   GFRNONAA >60 03/13/2013 2022   GFRAA 89 12/31/2019 1451   GFRAA >60 05/24/2019 1359   GFRAA >60 03/13/2013 2022       Component Value Date/Time   WBC 8.8 06/10/2022 1338   WBC 8.1 05/14/2021 1429   RBC 4.64 06/10/2022 1338   RBC 4.62 06/10/2022 1338   HGB 14.1 06/10/2022 1338   HGB 11.9 01/17/2019 1519   HGB 12.0 01/17/2019 1519   HCT 43.1 06/10/2022 1338   HCT 36.1 01/17/2019 1519   HCT 36.5 01/17/2019 1519   PLT 329 06/10/2022 1338   PLT 333 01/17/2019 1519   PLT 338 01/17/2019 1519   MCV 93.3 06/10/2022 1338   MCV 82 01/17/2019 1519   MCV 83 01/17/2019 1519   MCV 85 03/13/2013  2022   MCH 30.5 06/10/2022 1338   MCHC 32.7 06/10/2022 1338   RDW 12.2 06/10/2022 1338   RDW 14.1 01/17/2019 1519   RDW 14.2 01/17/2019 1519   RDW 13.4 03/13/2013 2022   LYMPHSABS 2.8 06/10/2022 1338   LYMPHSABS 2.8 01/17/2019 1519   LYMPHSABS 3.0 01/17/2019 1519   MONOABS 0.4 06/10/2022 1338   EOSABS 0.1 06/10/2022 1338   EOSABS 0.2 01/17/2019 1519   EOSABS 0.2 01/17/2019 1519   BASOSABS 0.0 06/10/2022 1338   BASOSABS 0.0 01/17/2019 1519   BASOSABS 0.0 01/17/2019 1519     No results found for: "POCLITH", "LITHIUM"   No results found for: "PHENYTOIN", "PHENOBARB", "VALPROATE", "CBMZ"   .res Assessment: Plan:    Plan:  PDMP reviewed  Effexor XR 150 and 75mg  every morning Xanax 0.25mg  as needed - takes once a week Buspar 10mg  TID.  RTC 6 months  Patient advised to contact office with any questions, adverse effects, or acute worsening in signs and symptoms.  Discussed potential benefits, risk, and side effects of benzodiazepines to include potential risk of tolerance and dependence, as well as possible drowsiness.  Advised patient not to drive if experiencing drowsiness and to take lowest possible effective dose to minimize risk of dependence and tolerance.   There are no diagnoses linked to this encounter.   Please see After Visit Summary for patient specific instructions.  Future Appointments  Date Time Provider Department Center  01/16/2023  5:00 PM Edison Nicholson, Thereasa Solo, NP [facility] None  02/15/2023  3:45 PM [place] [facility]  02/17/2023  7:20 AM Mort Sawyers, FNP [facility]  06/16/2023 12:45 PM [facility] None  06/16/2023  1:00 PM Erenest Blank, NP [facility] None    No orders of the defined types were placed in this encounter.     -------------------------------

## 2023-01-17 ENCOUNTER — Encounter: Payer: Self-pay | Admitting: Podiatry

## 2023-02-11 ENCOUNTER — Other Ambulatory Visit: Payer: Self-pay | Admitting: Podiatry

## 2023-02-15 ENCOUNTER — Ambulatory Visit: Payer: 59 | Admitting: Podiatry

## 2023-02-17 ENCOUNTER — Ambulatory Visit (INDEPENDENT_AMBULATORY_CARE_PROVIDER_SITE_OTHER): Payer: 59 | Admitting: Family

## 2023-02-17 ENCOUNTER — Encounter: Payer: Self-pay | Admitting: Family

## 2023-02-17 VITALS — BP 124/88 | HR 90 | Temp 98.0°F | Ht 64.0 in | Wt 230.0 lb

## 2023-02-17 DIAGNOSIS — Z0001 Encounter for general adult medical examination with abnormal findings: Secondary | ICD-10-CM

## 2023-02-17 DIAGNOSIS — Z23 Encounter for immunization: Secondary | ICD-10-CM

## 2023-02-17 DIAGNOSIS — Z6834 Body mass index (BMI) 34.0-34.9, adult: Secondary | ICD-10-CM

## 2023-02-17 DIAGNOSIS — E782 Mixed hyperlipidemia: Secondary | ICD-10-CM | POA: Diagnosis not present

## 2023-02-17 DIAGNOSIS — R7989 Other specified abnormal findings of blood chemistry: Secondary | ICD-10-CM

## 2023-02-17 DIAGNOSIS — R7303 Prediabetes: Secondary | ICD-10-CM

## 2023-02-17 DIAGNOSIS — Z1231 Encounter for screening mammogram for malignant neoplasm of breast: Secondary | ICD-10-CM

## 2023-02-17 DIAGNOSIS — Z789 Other specified health status: Secondary | ICD-10-CM

## 2023-02-17 DIAGNOSIS — E6609 Other obesity due to excess calories: Secondary | ICD-10-CM

## 2023-02-17 DIAGNOSIS — E66811 Other obesity due to excess calories: Secondary | ICD-10-CM

## 2023-02-17 DIAGNOSIS — R635 Abnormal weight gain: Secondary | ICD-10-CM

## 2023-02-17 DIAGNOSIS — E669 Obesity, unspecified: Secondary | ICD-10-CM

## 2023-02-17 DIAGNOSIS — E559 Vitamin D deficiency, unspecified: Secondary | ICD-10-CM

## 2023-02-17 DIAGNOSIS — D508 Other iron deficiency anemias: Secondary | ICD-10-CM

## 2023-02-17 MED ORDER — WEGOVY 0.25 MG/0.5ML ~~LOC~~ SOAJ: SUBCUTANEOUS | 0 refills | Status: DC

## 2023-02-17 MED ORDER — WEGOVY 1 MG/0.5ML ~~LOC~~ SOAJ: SUBCUTANEOUS | 0 refills | Status: DC

## 2023-02-17 MED ORDER — WEGOVY 0.5 MG/0.5ML ~~LOC~~ SOAJ: SUBCUTANEOUS | 2 refills | Status: DC

## 2023-02-17 NOTE — Assessment & Plan Note (Signed)
Pt to f/u with hematology as scheduled.

## 2023-02-17 NOTE — Progress Notes (Signed)
Subjective:  [name], female    DOB: 17-Jun-1970  Age: 52 y.o. MRN: [id number]  Patient Care Team: Mort Sawyers, FNP as PCP - General (Family Medicine)   CC:  Chief Complaint  Patient presents with   Annual Exam    HPI [name] is a 52 y.o. female who presents today for an annual physical exam. She reports consuming a general diet. The patient does not participate in regular exercise at present. She generally feels well. She reports sleeping well. She does have additional problems to discuss today.   Vision:Within last year Dental:Receives regular dental care   Mammogram: 07/28/21, overdue. Last pap: 03/29/22, negative. Pt still with cervix and ovaries, no uterus. Colonoscopy: 12/07/2017, due every five years.   Pt is with acute concerns.  Nontraumatic rupture fascia: seeing podiatry, recently cleared for exercising with limitations. With this she does state weight has been difficult, and she is gaining weight due to immobility.  Diet: could be better, trying to pay attention to what is going in her mouth. She does stress eat.   Wt Readings from Last 3 Encounters:  02/17/23 230 lb (104.3 kg)  11/10/22 223 lb (101.2 kg)  06/10/22 206 lb (93.4 kg)   Last vitamin D Lab Results  Component Value Date   VD25OH 27.8 (L) 01/17/2019        Advanced Directives Patient does have advanced directives   DEPRESSION SCREENING    02/17/2023    7:44 AM 05/23/2022   12:09 PM 01/21/2022    8:51 AM 02/23/2021    3:33 PM 01/21/2021    2:48 PM 12/31/2019    2:39 PM 10/10/2019    4:44 PM  PHQ 2/9 Scores  PHQ - 2 Score 1 2 2 2 2 2  0  PHQ- 9 Score 11 12 10 15 14 8 8      ROS: Negative unless specifically indicated above in HPI.    Current Outpatient Medications:    ALPRAZolam (XANAX) 0.25 MG tablet, TAKE 1 TABLET(0.25 MG) BY MOUTH DAILY AS NEEDED FOR ANXIETY OR SLEEP, Disp: 30 tablet, Rfl: 2   amLODipine (NORVASC) 10 MG tablet, TAKE 1 TABLET(10 MG) BY  MOUTH DAILY, Disp: 90 tablet, Rfl: 3   busPIRone (BUSPAR) 10 MG tablet, Take 1 tablet (10 mg total) by mouth 3 (three) times daily., Disp: 90 tablet, Rfl: 5   famotidine (PEPCID) 20 MG tablet, TAKE 1 TABLET BY MOUTH AFTER SUPPER, Disp: 30 tablet, Rfl: 11   gabapentin (NEURONTIN) 300 MG capsule, Take 1 capsule (300 mg total) by mouth 2 (two) times daily., Disp: 60 capsule, Rfl: 2   meloxicam (MOBIC) 15 MG tablet, TAKE 1 TABLET(15 MG) BY MOUTH DAILY, Disp: 30 tablet, Rfl: 3   Semaglutide-Weight Management (WEGOVY) 0.25 MG/0.5ML SOAJ, Start 0.25 mg qweek for four weeks, then increase to 0.5 mg dose qweek, Disp: 2 mL, Rfl: 0   [START ON 03/10/2023] Semaglutide-Weight Management (WEGOVY) 0.5 MG/0.5ML SOAJ, After four weeks of 0.25 mg qweek, increase to 0.5 mg qweek, Disp: 2 mL, Rfl: 2   [START ON 03/31/2023] Semaglutide-Weight Management (WEGOVY) 1 MG/0.5ML SOAJ, Inject 1 mg qweek [place] after completing four weeks of 0.5 mg dosing QW. After four weeks of 1 mg increase to 1.7 mg QW with new RX, Disp: 2 mL, Rfl: 0   SUMAtriptan (IMITREX) 50 MG tablet, TAKE 1 TABLET MAY REPEAT IN 2 HOURS IF HEADACHE PERSISTS OR RECURS. DON'T USE MORE THAN TWO IN 24HRS, Disp: 10 tablet, Rfl: 2  traMADol (ULTRAM) 50 MG tablet, TAKE 1 TABLET(50 MG) BY MOUTH EVERY 4 HOURS FOR UP TO 5 DAYS AS NEEDED FOR COUGH OR PAIN, Disp: 30 tablet, Rfl: 0   venlafaxine XR (EFFEXOR XR) 150 MG 24 hr capsule, Take 1 capsule (150 mg total) by mouth daily with breakfast., Disp: 30 capsule, Rfl: 5   venlafaxine XR (EFFEXOR XR) 75 MG 24 hr capsule, Take 1 capsule (75 mg total) by mouth daily with breakfast., Disp: 30 capsule, Rfl: 5   Vitamin D-Vitamin K (VITAMIN K2-VITAMIN D3 PO), Take by mouth., Disp: , Rfl:     Objective:    BP 124/88 (BP Location: Left Arm, Patient Position: Sitting, Cuff Size: Normal)   Pulse 90   Temp 98 F (36.7 C) (Temporal)   Ht 5\' 4"  (1.626 m)   Wt 230 lb (104.3 kg)   LMP 09/07/2009   SpO2 96%   BMI 39.48 kg/m    BP Readings from Last 3 Encounters:  02/17/23 124/88  11/10/22 116/84  06/10/22 136/85      Physical Exam Constitutional:      General: She is not in acute distress.    Appearance: Normal appearance. She is obese. She is not ill-appearing.  HENT:     Head: Normocephalic.     Right Ear: Tympanic membrane normal.     Left Ear: Tympanic membrane normal.     Nose: Nose normal.     Mouth/Throat:     Mouth: Mucous membranes are moist.  Eyes:     Extraocular Movements: Extraocular movements intact.     Pupils: Pupils are equal, round, and reactive to light.  Cardiovascular:     Rate and Rhythm: Normal rate and regular rhythm.  Pulmonary:     Effort: Pulmonary effort is normal.     Breath sounds: Normal breath sounds.  Abdominal:     General: Abdomen is flat. Bowel sounds are normal.     Palpations: Abdomen is soft.     Tenderness: There is no guarding or rebound.  Musculoskeletal:        General: Normal range of motion.     Cervical back: Normal range of motion.  Skin:    General: Skin is warm.     Capillary Refill: Capillary refill takes less than 2 seconds.  Neurological:     General: No focal deficit present.     Mental Status: She is alert.  Psychiatric:        Mood and Affect: Mood normal.        Behavior: Behavior normal.        Thought Content: Thought content normal.        Judgment: Judgment normal.          Assessment & Plan:  Encounter for immunization -     Flu vaccine trivalent PF, 6mos and older(Flulaval,Afluria,Fluarix,Fluzone)  Obesity (BMI 35.0-39.9 without comorbidity) -     ZOXWRU; After four weeks of 0.25 mg qweek, increase to 0.5 mg qweek  Dispense: 2 mL; Refill: 2 -     Wegovy; Start 0.25 mg qweek for four weeks, then increase to 0.5 mg dose qweek  Dispense: 2 mL; Refill: 0 -     EAVWUJ; Inject 1 mg qweek [place] after completing four weeks of 0.5 mg dosing QW. After four weeks of 1 mg increase to 1.7 mg QW with new RX  Dispense: 2 mL; Refill:  0 -     Amb Ref to Medical Weight Management -  T4, free -     TSH  Prediabetes Assessment & Plan: Pt advised of the following: Work on a diabetic diet, try to incorporate exercise at least 20-30 a day for 3 days a week or more.  A1c ordered today pending results.  Orders: -     Wegovy; After four weeks of 0.25 mg qweek, increase to 0.5 mg qweek  Dispense: 2 mL; Refill: 2 -     Wegovy; Start 0.25 mg qweek for four weeks, then increase to 0.5 mg dose qweek  Dispense: 2 mL; Refill: 0 -     WUJWJX; Inject 1 mg qweek [place] after completing four weeks of 0.5 mg dosing QW. After four weeks of 1 mg increase to 1.7 mg QW with new RX  Dispense: 2 mL; Refill: 0 -     Amb Ref to Medical Weight Management -     Hemoglobin A1c  Elevated LFTs -     Comprehensive metabolic panel  Mixed hyperlipidemia Assessment & Plan: Ordered lipid panel, pending results. Work on low cholesterol diet and exercise as tolerated Admits to poor diet.   Orders: -     Lipid panel  Abnormal weight gain -     Hemoglobin A1c  No blood products  Screening mammogram for breast cancer -     3D Screening Mammogram, Left and Right; Future  Class 1 obesity due to excess calories with serious comorbidity and body mass index (BMI) of 34.0 to 34.9 in adult Assessment & Plan: Pt advised to work on diet and exercise as tolerated Trial start wegovy, pending insurance.  The beneficiary does not have any FDA labeled contraindications to the requested agent including pregnancy, lactation, h/o medullary thyroid cancer or multiple endocrine neoplasia type II.      Iron deficiency anemia secondary to inadequate dietary iron intake Assessment & Plan: Pt to f/u with hematology as scheduled.     Vitamin D deficiency -     VITAMIN D 25 Hydroxy (Vit-D Deficiency, Fractures)  Encounter for general adult medical examination with abnormal findings Assessment & Plan: Pt declines this year though she does know it is overdue.   Patient Counseling(The following topics were reviewed):  Preventative care handout given to pt  Health maintenance and immunizations reviewed. Please refer to Health maintenance section. Pt advised on safe sex, wearing seatbelts in car, and proper nutrition labwork ordered today for annual Dental health: Discussed importance of regular tooth brushing, flossing, and dental visits.    Other orders -     Varicella-zoster vaccine IM      Follow-up: Return in about 3 months (around 05/20/2023) for f/u weight loss medication.   Mort Sawyers, FNP

## 2023-02-17 NOTE — Assessment & Plan Note (Signed)
Pt declines this year though she does know it is overdue.  Patient Counseling(The following topics were reviewed):  Preventative care handout given to pt  Health maintenance and immunizations reviewed. Please refer to Health maintenance section. Pt advised on safe sex, wearing seatbelts in car, and proper nutrition labwork ordered today for annual Dental health: Discussed importance of regular tooth brushing, flossing, and dental visits.

## 2023-02-17 NOTE — Assessment & Plan Note (Signed)
Pt advised to work on diet and exercise as tolerated Trial start wegovy, pending insurance.  The beneficiary does not have any FDA labeled contraindications to the requested agent including pregnancy, lactation, h/o medullary thyroid cancer or multiple endocrine neoplasia type II.

## 2023-02-17 NOTE — Assessment & Plan Note (Signed)
Pt advised of the following: Work on a diabetic diet, try to incorporate exercise at least 20-30 a day for 3 days a week or more.  A1c ordered today pending results.

## 2023-02-17 NOTE — Assessment & Plan Note (Signed)
Ordered lipid panel, pending results. Work on low cholesterol diet and exercise as tolerated Admits to poor diet.

## 2023-02-18 LAB — LIPID PANEL
Chol/HDL Ratio: 4.3 ratio (ref 0.0–4.4)
Cholesterol, Total: 234 mg/dL — ABNORMAL HIGH (ref 100–199)
HDL: 54 mg/dL (ref >39)
LDL Chol Calc (NIH): 145 mg/dL — ABNORMAL HIGH (ref 0–99)
Triglycerides: 194 mg/dL — ABNORMAL HIGH (ref 0–149)
VLDL Cholesterol Cal: 35 mg/dL (ref 5–40)

## 2023-02-18 LAB — HEMOGLOBIN A1C
Est. average glucose Bld gHb Est-mCnc: 131 mg/dL
Hgb A1c MFr Bld: 6.2 % — ABNORMAL HIGH (ref 4.8–5.6)

## 2023-02-18 LAB — COMPREHENSIVE METABOLIC PANEL
ALT: 27 [IU]/L (ref 0–32)
AST: 19 [IU]/L (ref 0–40)
Albumin: 4.4 g/dL (ref 3.8–4.9)
Alkaline Phosphatase: 89 [IU]/L (ref 44–121)
BUN/Creatinine Ratio: 17 (ref 9–23)
BUN: 13 mg/dL (ref 6–24)
Bilirubin Total: 0.3 mg/dL (ref 0.0–1.2)
CO2: 22 mmol/L (ref 20–29)
Calcium: 9.3 mg/dL (ref 8.7–10.2)
Chloride: 100 mmol/L (ref 96–106)
Creatinine, Ser: 0.76 mg/dL (ref 0.57–1.00)
Globulin, Total: 2.7 g/dL (ref 1.5–4.5)
Glucose: 117 mg/dL — ABNORMAL HIGH (ref 70–99)
Potassium: 4.5 mmol/L (ref 3.5–5.2)
Sodium: 137 mmol/L (ref 134–144)
Total Protein: 7.1 g/dL (ref 6.0–8.5)
eGFR: 95 mL/min/{1.73_m2} (ref >59)

## 2023-02-18 LAB — TSH: TSH: 1.96 u[IU]/mL (ref 0.450–4.500)

## 2023-02-18 LAB — T4, FREE: Free T4: 0.81 ng/dL — ABNORMAL LOW (ref 0.82–1.77)

## 2023-02-18 LAB — VITAMIN D 25 HYDROXY (VIT D DEFICIENCY, FRACTURES): Vit D, 25-Hydroxy: 42.5 ng/mL (ref 30.0–100.0)

## 2023-02-20 ENCOUNTER — Encounter: Payer: Self-pay | Admitting: Family

## 2023-02-20 DIAGNOSIS — R7303 Prediabetes: Secondary | ICD-10-CM

## 2023-02-20 DIAGNOSIS — E782 Mixed hyperlipidemia: Secondary | ICD-10-CM

## 2023-02-20 DIAGNOSIS — I10 Essential (primary) hypertension: Secondary | ICD-10-CM

## 2023-02-20 MED ORDER — ATORVASTATIN CALCIUM 10 MG PO TABS: 10.0000 mg | ORAL_TABLET | Freq: Every day | ORAL | 3 refills | Status: DC

## 2023-02-21 NOTE — Addendum Note (Signed)
Addended by: Mort Sawyers on: 02/21/2023 03:14 PM   Modules accepted: Orders

## 2023-03-06 ENCOUNTER — Encounter: Payer: Self-pay | Admitting: Family

## 2023-03-06 DIAGNOSIS — E669 Obesity, unspecified: Secondary | ICD-10-CM

## 2023-03-06 DIAGNOSIS — R7303 Prediabetes: Secondary | ICD-10-CM

## 2023-03-06 NOTE — Telephone Encounter (Signed)
This is ok with me  However couldn't we just send for prior auth or did we already?

## 2023-03-06 NOTE — Telephone Encounter (Signed)
Ok to fax notes as requested.

## 2023-03-09 ENCOUNTER — Telehealth: Payer: Self-pay

## 2023-03-09 MED ORDER — WEGOVY 1 MG/0.5ML ~~LOC~~ SOAJ: SUBCUTANEOUS | 0 refills | Status: DC

## 2023-03-09 MED ORDER — WEGOVY 0.25 MG/0.5ML ~~LOC~~ SOAJ: SUBCUTANEOUS | 0 refills | Status: DC

## 2023-03-09 MED ORDER — WEGOVY 0.5 MG/0.5ML ~~LOC~~ SOAJ: SUBCUTANEOUS | 2 refills | Status: DC

## 2023-03-09 NOTE — Telephone Encounter (Signed)
Pharmacy Patient Advocate Encounter   Received notification from Patient Advice Request messages that prior authorization for [facility] 0.25MG /0.5ML auto-injectors is required/requested.   Insurance verification completed.   The patient is insured through [facility] .   Per test claim: PA required; PA submitted to above mentioned insurance via CoverMyMeds Key/confirmation #/EOC  [id number] Status is pending

## 2023-03-10 ENCOUNTER — Other Ambulatory Visit (HOSPITAL_COMMUNITY): Payer: Self-pay

## 2023-03-10 NOTE — Telephone Encounter (Signed)
Pharmacy Patient Advocate Encounter  Received notification from [facility] that Prior Authorization for [facility] 0.25MG /0.5ML auto-injectors  has been APPROVED from 03/09/23 to 09/06/23   PA #/Case ID/Reference #: [id number]

## 2023-03-27 ENCOUNTER — Encounter: Payer: Self-pay | Admitting: Podiatry

## 2023-03-27 ENCOUNTER — Ambulatory Visit: Payer: 59 | Admitting: Podiatry

## 2023-03-27 DIAGNOSIS — M62171 Other rupture of muscle (nontraumatic), right ankle and foot: Secondary | ICD-10-CM | POA: Diagnosis not present

## 2023-03-27 DIAGNOSIS — M722 Plantar fascial fibromatosis: Secondary | ICD-10-CM | POA: Diagnosis not present

## 2023-03-27 NOTE — Patient Instructions (Signed)
Call [facility] to schedule your MRI at the below locations.  Please allow at least 1 business day after your visit to process the referral.  It may take longer depending on approval from insurance.  Please let me know if you have issues or problems scheduling the MRI   [facility] [phone] [address] [place], [place] [address]  [facility] [phone] [address] [place], [place] [address]  

## 2023-03-28 ENCOUNTER — Telehealth: Payer: Self-pay

## 2023-03-28 NOTE — Progress Notes (Signed)
  Subjective:  [name], female    DOB: 11/18/1970,  MRN: [id number]  Chief Complaint  Patient presents with   Plantar Fasciitis    "It's doing alright."    52 y.o. female presents with the above complaint. History confirmed with patient.  She was doing really well after wearing the boot but after stopping the boot she has noticed her pain is gradually returning again.  Objective:  Physical Exam: warm, good capillary refill, no trophic changes or ulcerative lesions, normal DP and PT pulses, and normal sensory exam.  Pain and tenderness in plantar right mid arch with palpation and at the heel   Radiographs: Multiple views x-ray of the right foot: Radiographs taken today show no fracture dislocation or joint abnormality Assessment:   1. 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 Plantar fasciitis of right foot      Plan:  Patient was evaluated and treated and all questions answered.  Unfortunate has relapsed after support in the boot.  We discussed that there is chronic recalcitrant plantar fasciitis and sequela of her previous rupture, and long-term treatment of this may include need for surgical intervention and fasciotomy as well as possible gastrocnemius recession.  Patricia Bauer's guidance and planning this further intervention I recommend an MRI.  This has been ordered and she will follow-up me after the studies completed.  No follow-ups on file.

## 2023-03-28 NOTE — Telephone Encounter (Signed)
-----   Message from [place] sent at 03/28/2023  4:19 PM EST ----- Regarding: mychart video visit Pt on my schedule tomorrow as a video visit.   I will not be able to evaluate this patient via mychart for this concern. I understand the circumstances but this is not appropriate for a video visit as a physical exam is necessary. If it is that painful pt could should also consider [facility].

## 2023-03-28 NOTE — Telephone Encounter (Signed)
Pt notified as instructed and pt voiced understanding but pt said to cancel appt. I offered to change appt to in office appt but pt said she cannot drive due to pain; pt said sitting and not moving the pain level is 6 and pt cannot stand up straight. I advised pt someone could take her to [facility] or an UC of pts choice. Pt said she had no one to drive her.and pt managed to get to chiropractor yesterday but that really did not help a lot. I advised pt if pain was bad enough pt could call EMS and they would transport pt to ED. Pt said that was too expensive a ride.pt said she will wait to see if pain will resolve itself and to cancel appt on 03/29/23... UC & ED precautions given again and pt voiced understanding. Sending FYI to Hayden Pedro FNP.

## 2023-03-29 ENCOUNTER — Telehealth: Payer: 59 | Admitting: Family

## 2023-03-29 DIAGNOSIS — S39012A Strain of muscle, fascia and tendon of lower back, initial encounter: Secondary | ICD-10-CM | POA: Insufficient documentation

## 2023-03-29 DIAGNOSIS — M545 Low back pain, unspecified: Secondary | ICD-10-CM | POA: Insufficient documentation

## 2023-03-29 DIAGNOSIS — M533 Sacrococcygeal disorders, not elsewhere classified: Secondary | ICD-10-CM | POA: Insufficient documentation

## 2023-03-29 DIAGNOSIS — M47816 Spondylosis without myelopathy or radiculopathy, lumbar region: Secondary | ICD-10-CM | POA: Insufficient documentation

## 2023-03-29 NOTE — Telephone Encounter (Signed)
Noted  

## 2023-04-03 ENCOUNTER — Ambulatory Visit: Payer: 59 | Admitting: Dietician

## 2023-04-04 ENCOUNTER — Telehealth: Payer: 59 | Admitting: Adult Health

## 2023-04-04 ENCOUNTER — Encounter: Payer: Self-pay | Admitting: Adult Health

## 2023-04-04 DIAGNOSIS — F4000 Agoraphobia, unspecified: Secondary | ICD-10-CM | POA: Diagnosis not present

## 2023-04-04 DIAGNOSIS — F331 Major depressive disorder, recurrent, moderate: Secondary | ICD-10-CM | POA: Diagnosis not present

## 2023-04-04 DIAGNOSIS — F422 Mixed obsessional thoughts and acts: Secondary | ICD-10-CM

## 2023-04-04 DIAGNOSIS — F411 Generalized anxiety disorder: Secondary | ICD-10-CM

## 2023-04-04 DIAGNOSIS — F410 Panic disorder [episodic paroxysmal anxiety] without agoraphobia: Secondary | ICD-10-CM | POA: Diagnosis not present

## 2023-04-04 MED ORDER — ARIPIPRAZOLE 2 MG PO TABS: 2.0000 mg | ORAL_TABLET | Freq: Every day | ORAL | 2 refills | Status: DC

## 2023-04-04 MED ORDER — VENLAFAXINE HCL ER 150 MG PO CP24: 150.0000 mg | ORAL_CAPSULE | Freq: Every day | ORAL | 5 refills | Status: DC

## 2023-04-04 MED ORDER — ALPRAZOLAM 0.25 MG PO TABS: ORAL_TABLET | ORAL | 2 refills | Status: DC

## 2023-04-04 MED ORDER — BUSPIRONE HCL 10 MG PO TABS: 10.0000 mg | ORAL_TABLET | Freq: Three times a day (TID) | ORAL | 5 refills | Status: DC

## 2023-04-04 MED ORDER — VENLAFAXINE HCL ER 75 MG PO CP24: 75.0000 mg | ORAL_CAPSULE | Freq: Every day | ORAL | 5 refills | Status: DC

## 2023-04-04 NOTE — Progress Notes (Signed)
Patricia Bauer [id number] Jan 06, 1971 52 y.o.  Virtual Visit via Video Note  I connected with pt @ on 04/04/23 at 11:20 AM EST by a video enabled telemedicine application and verified that I am speaking with the correct person using two identifiers.   I discussed the limitations of evaluation and management by telemedicine and the availability of in person appointments. The patient expressed understanding and agreed to proceed.  I discussed the assessment and treatment plan with the patient. The patient was provided an opportunity to ask questions and all were answered. The patient agreed with the plan and demonstrated an understanding of the instructions.   The patient was advised to call back or seek an in-person evaluation if the symptoms worsen or if the condition fails to improve as anticipated.  I provided 25 minutes of non-face-to-face time during this encounter.  The patient was located at home.  The provider was located at [facility].   Dorothyann Gibbs, NP   Subjective:   Patient ID:  Patricia Bauer is a 52 y.o. (DOB 1970-07-20) female.  Chief Complaint: No chief complaint on file.   HPI Patricia Bauer presents for follow-up of MDD, GAD, insomnia, panic attacks, and agoraphobia.   Describes mood today as "ok". Pleasant. Tearful at times. Mood symptoms - reports depressive episodes - every other week. Reports increased anxiety with situational stressors. Reports feeling more irritable - "small things setting me off pretty bad". Reports panic attacks - "2 this month so far". Reports worry, rumination, and over thinking - "all the time". Reports decreased obsessive acts - skin picking. Reporting chronic pain issues - fibromyalgia. Stating "I don't feel like I'm doing to good right now". Feels like her fibromyalgia is disrupting her mood and ability to work.  Feels like medications are helpful. Family doing well. Varying interest and motivation. Taking medications  as prescribed.  Energy levels are "lower". Active, does not have a regular exercise routine with physical disabilities. Enjoys some usual interests and activities. Single. Divorced. Lives with daughter (48) 2 children. Mother in memory care unit. Spending time with family and friends. Appetite adequate. Reporting weight gain.  Sleeps better some nights than others. Averages 7 hours - diagnosed with mild sleep apnea - unable to afford CPAP machine. Focus and concentration difficulties. Completing tasks. Managing aspects of household. Works full time for [employer] - remote - day shift. Denies SI or HI. Reports suicidal ideation once to twice a month - no plan or intent. Denies AH or VH. Denies self harm - skin picking. Denies substance.  Therapist - Elisha Ponder - x 2 years.  Previous medication trials:  Celexa, Zoloft - flat, Wellbutrin   Review of Systems:  Review of Systems  Musculoskeletal:  Negative for gait problem.  Neurological:  Negative for tremors.  Psychiatric/Behavioral:         Please refer to HPI    Medications: I have reviewed the patient's current medications.  Current Outpatient Medications  Medication Sig Dispense Refill   ALPRAZolam (XANAX) 0.25 MG tablet TAKE 1 TABLET(0.25 MG) BY MOUTH DAILY AS NEEDED FOR ANXIETY OR SLEEP 30 tablet 2   amLODipine (NORVASC) 10 MG tablet TAKE 1 TABLET(10 MG) BY MOUTH DAILY 90 tablet 3   atorvastatin (LIPITOR) 10 MG tablet Take 1 tablet (10 mg total) by mouth daily. 90 tablet 3   busPIRone (BUSPAR) 10 MG tablet Take 1 tablet (10 mg total) by mouth 3 (three) times daily. 90 tablet 5   famotidine (PEPCID)  20 MG tablet TAKE 1 TABLET BY MOUTH AFTER SUPPER 30 tablet 11   gabapentin (NEURONTIN) 300 MG capsule Take 1 capsule (300 mg total) by mouth 2 (two) times daily. 60 capsule 2   meloxicam (MOBIC) 15 MG tablet TAKE 1 TABLET(15 MG) BY MOUTH DAILY 30 tablet 3   Semaglutide-Weight Management (WEGOVY) 0.25 MG/0.5ML SOAJ Start 0.25 mg qweek  for four weeks, then increase to 0.5 mg dose qweek 2 mL 0   Semaglutide-Weight Management (WEGOVY) 0.5 MG/0.5ML SOAJ After four weeks of 0.25 mg qweek, increase to 0.5 mg qweek 2 mL 2   Semaglutide-Weight Management (WEGOVY) 1 MG/0.5ML SOAJ Inject 1 mg qweek [place] after completing four weeks of 0.5 mg dosing QW. After four weeks of 1 mg increase to 1.7 mg QW with new RX 2 mL 0   SUMAtriptan (IMITREX) 50 MG tablet TAKE 1 TABLET MAY REPEAT IN 2 HOURS IF HEADACHE PERSISTS OR RECURS. DON'T USE MORE THAN TWO IN 24HRS 10 tablet 2   traMADol (ULTRAM) 50 MG tablet TAKE 1 TABLET(50 MG) BY MOUTH EVERY 4 HOURS FOR UP TO 5 DAYS AS NEEDED FOR COUGH OR PAIN 30 tablet 0   venlafaxine XR (EFFEXOR XR) 150 MG 24 hr capsule Take 1 capsule (150 mg total) by mouth daily with breakfast. 30 capsule 5   venlafaxine XR (EFFEXOR XR) 75 MG 24 hr capsule Take 1 capsule (75 mg total) by mouth daily with breakfast. 30 capsule 5   Vitamin D-Vitamin K (VITAMIN K2-VITAMIN D3 PO) Take by mouth.     No current facility-administered medications for this visit.    Medication Side Effects: None  Allergies:  Allergies  Allergen Reactions   Ibuprofen Swelling    Bilateral swelling of ankles and feet   Diflucan [Fluconazole] Other (See Comments)    Pelvic pain   Onion Other (See Comments)    migraines    Topamax [Topiramate] Other (See Comments)    Reaction:  Body aches     Omeprazole Other (See Comments)    Abdominal pain    Hydrocodone Anxiety, Palpitations and Other (See Comments)    Reaction:  Migraines     Past Medical History:  Diagnosis Date   Anemia    Anxiety    Colon polyp    Depression    GERD (gastroesophageal reflux disease)    Headache    migraines    History of hysterectomy    still has both ovaries and cervix   History of migraine headaches    History of ovarian cyst    Hypertension    PONV (postoperative nausea and vomiting)     Family History  Problem Relation Age of Onset   Stroke Father     Heart attack Father    Hyperlipidemia Mother    Hypertension Mother    Breast cancer Maternal Aunt 52   Colon cancer Neg Hx    Colon polyps Neg Hx    Kidney disease Neg Hx    Gallbladder disease Neg Hx    Esophageal cancer Neg Hx    Alcohol abuse Neg Hx    Drug abuse Neg Hx    Depression Neg Hx    Bipolar disorder Neg Hx    Anxiety disorder Neg Hx    Schizophrenia Neg Hx    Suicidality Neg Hx     Social History   Socioeconomic History   Marital status: Single    Spouse name: Not on file   Number of children: 1   Years  of education: Not on file   Highest education level: Some college, no degree  Occupational History   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[employer]  Tobacco Use   Smoking status: Never   Smokeless tobacco: Never  Vaping Use   Vaping status: Never Used  Substance and Sexual Activity   Alcohol use: Yes    Comment: occassional- once a month has about 2-3 drinks   Drug use: No   Sexual activity: Not Currently  Other Topics Concern   Not on file  Social History Narrative   Not on file   Social Drivers of Health   Financial Resource Strain: Low Risk  (02/16/2023)   Overall Financial Resource Strain (CARDIA)    Difficulty of Paying Living Expenses: Not very hard  Food Insecurity: Food Insecurity Present (02/16/2023)   Hunger Vital Sign    Worried About Running Out of Food in the Last Year: Sometimes true    Ran Out of Food in the Last Year: Never true  Transportation Needs: No Transportation Needs (02/16/2023)   PRAPARE - Administrator, Civil Service (Medical): No    Lack of Transportation (Non-Medical): No  Physical Activity: Unknown (02/16/2023)   Exercise Vital Sign    Days of Exercise per Week: 0 days    Minutes of Exercise per Session: Not on file  Stress: Stress Concern Present (02/16/2023)   [employer] of Occupational Health - Occupational Stress Questionnaire    Feeling of Stress : Very much  Social Connections: Unknown  (02/16/2023)   Social Connection and Isolation Panel [NHANES]    Frequency of Communication with Friends and Family: Patient declined    Frequency of Social Gatherings with Friends and Family: Once a week    Attends Religious Services: More than 4 times per year    Active Member of [employer] or Organizations: Yes    Attends Engineer, structural: More than 4 times per year    Marital Status: Divorced  Catering manager Violence: Not on file    Past Medical History, Surgical history, Social history, and Family history were reviewed and updated as appropriate.   Please see review of systems for further details on the patient's review from today.   Objective:   Physical Exam:  LMP 09/07/2009   Physical Exam Constitutional:      General: She is not in acute distress. Musculoskeletal:        General: No deformity.  Neurological:     Mental Status: She is alert and oriented to person, place, and time.     Coordination: Coordination normal.  Psychiatric:        Attention and Perception: Attention and perception normal. She does not perceive auditory or visual hallucinations.        Mood and Affect: Mood normal. Mood is not anxious or depressed. Affect is not labile, blunt, angry or inappropriate.        Speech: Speech normal.        Behavior: Behavior normal.        Thought Content: Thought content normal. Thought content is not paranoid or delusional. Thought content does not include homicidal or suicidal ideation. Thought content does not include homicidal or suicidal plan.        Cognition and Memory: Cognition and memory normal.        Judgment: Judgment normal.     Comments: Insight intact     Lab Review:     Component Value Date/Time   NA 137 02/17/2023 0823   NA  139 03/13/2013 2022   K 4.5 02/17/2023 0823   K 3.6 03/13/2013 2022   CL 100 02/17/2023 0823   CL 106 03/13/2013 2022   CO2 22 02/17/2023 0823   CO2 26 03/13/2013 2022   GLUCOSE 117 (H) 02/17/2023 0823    GLUCOSE 110 (H) 05/04/2020 0503   GLUCOSE 94 03/13/2013 2022   BUN 13 02/17/2023 0823   BUN 11 03/13/2013 2022   CREATININE 0.76 02/17/2023 0823   CREATININE 1.05 (H) 05/24/2019 1359   CREATININE 0.80 03/13/2013 2022   CALCIUM 9.3 02/17/2023 0823   CALCIUM 8.7 03/13/2013 2022   PROT 7.1 02/17/2023 0823   ALBUMIN 4.4 02/17/2023 0823   AST 19 02/17/2023 0823   AST 27 05/24/2019 1359   ALT 27 02/17/2023 0823   ALT 32 05/24/2019 1359   ALKPHOS 89 02/17/2023 0823   BILITOT 0.3 02/17/2023 0823   BILITOT 0.3 05/24/2019 1359   GFRNONAA >60 05/04/2020 0503   GFRNONAA >60 05/24/2019 1359   GFRNONAA >60 03/13/2013 2022   GFRAA 89 12/31/2019 1451   GFRAA >60 05/24/2019 1359   GFRAA >60 03/13/2013 2022       Component Value Date/Time   WBC 8.8 06/10/2022 1338   WBC 8.1 05/14/2021 1429   RBC 4.64 06/10/2022 1338   RBC 4.62 06/10/2022 1338   HGB 14.1 06/10/2022 1338   HGB 11.9 01/17/2019 1519   HGB 12.0 01/17/2019 1519   HCT 43.1 06/10/2022 1338   HCT 36.1 01/17/2019 1519   HCT 36.5 01/17/2019 1519   PLT 329 06/10/2022 1338   PLT 333 01/17/2019 1519   PLT 338 01/17/2019 1519   MCV 93.3 06/10/2022 1338   MCV 82 01/17/2019 1519   MCV 83 01/17/2019 1519   MCV 85 03/13/2013 2022   MCH 30.5 06/10/2022 1338   MCHC 32.7 06/10/2022 1338   RDW 12.2 06/10/2022 1338   RDW 14.1 01/17/2019 1519   RDW 14.2 01/17/2019 1519   RDW 13.4 03/13/2013 2022   LYMPHSABS 2.8 06/10/2022 1338   LYMPHSABS 2.8 01/17/2019 1519   LYMPHSABS 3.0 01/17/2019 1519   MONOABS 0.4 06/10/2022 1338   EOSABS 0.1 06/10/2022 1338   EOSABS 0.2 01/17/2019 1519   EOSABS 0.2 01/17/2019 1519   BASOSABS 0.0 06/10/2022 1338   BASOSABS 0.0 01/17/2019 1519   BASOSABS 0.0 01/17/2019 1519    No results found for: "POCLITH", "LITHIUM"   No results found for: "PHENYTOIN", "PHENOBARB", "VALPROATE", "CBMZ"   .res Assessment: Plan:    Plan:  PDMP reviewed  Effexor XR 150 and 75mg  every morning Xanax 0.25mg  as needed  - takes once a week Buspar 10mg  TID  RTC 4 weeks  Patient advised to contact office with any questions, adverse effects, or acute worsening in signs and symptoms.  Discussed potential benefits, risk, and side effects of benzodiazepines to include potential risk of tolerance and dependence, as well as possible drowsiness.  Advised patient not to drive if experiencing drowsiness and to take lowest possible effective dose to minimize risk of dependence and tolerance.   There are no diagnoses linked to this encounter.   Please see After Visit Summary for patient specific instructions.  Future Appointments  Date Time Provider Department Center  04/04/2023 11:20 AM Corday Wyka, Thereasa Solo, NP [facility] None  04/08/2023  1:15 PM [facility] 1 [facility]  05/22/2023  9:30 AM Darrick Grinder, RD [facility] None  06/16/2023 12:45 PM [facility] None  06/16/2023  1:00 PM Erenest Blank, NP [facility]  None    No orders of the defined types were placed in this encounter.     -------------------------------

## 2023-04-08 ENCOUNTER — Ambulatory Visit
Admission: RE | Admit: 2023-04-08 | Discharge: 2023-04-08 | Disposition: A | Discharge status: Home / Self Care | Payer: 59 | Source: Ambulatory Visit | Attending: Podiatry

## 2023-04-08 DIAGNOSIS — M62171 Other rupture of muscle (nontraumatic), right ankle and foot: Secondary | ICD-10-CM

## 2023-04-08 DIAGNOSIS — M722 Plantar fascial fibromatosis: Secondary | ICD-10-CM

## 2023-04-13 ENCOUNTER — Encounter: Payer: Self-pay | Admitting: Family

## 2023-04-13 DIAGNOSIS — E669 Obesity, unspecified: Secondary | ICD-10-CM

## 2023-04-13 DIAGNOSIS — R7303 Prediabetes: Secondary | ICD-10-CM

## 2023-04-14 MED ORDER — WEGOVY 0.25 MG/0.5ML ~~LOC~~ SOAJ: SUBCUTANEOUS | 0 refills | Status: DC

## 2023-04-14 NOTE — Addendum Note (Signed)
Addended by: Mort Sawyers on: 04/14/2023 11:31 AM   Modules accepted: Orders

## 2023-04-21 ENCOUNTER — Encounter: Payer: Self-pay | Admitting: Podiatry

## 2023-04-25 ENCOUNTER — Encounter: Payer: Self-pay | Admitting: Podiatry

## 2023-04-25 ENCOUNTER — Encounter: Payer: Self-pay | Admitting: Family

## 2023-05-12 ENCOUNTER — Other Ambulatory Visit: Payer: Self-pay | Admitting: Family

## 2023-05-12 DIAGNOSIS — R7303 Prediabetes: Secondary | ICD-10-CM

## 2023-05-12 DIAGNOSIS — E669 Obesity, unspecified: Secondary | ICD-10-CM

## 2023-05-15 ENCOUNTER — Encounter: Payer: Self-pay | Admitting: Family

## 2023-05-16 ENCOUNTER — Encounter: Payer: Self-pay | Admitting: Family

## 2023-05-16 NOTE — Telephone Encounter (Signed)
Can you please call pt to let her know, she didn't read her mychart message.   Are you taking the wegovy? If yes what dose are you on?    Make sure you make an appointment in feb you are due for f/u.  Hope you are doing well.

## 2023-05-16 NOTE — Telephone Encounter (Signed)
LM for pt to returncall

## 2023-05-19 ENCOUNTER — Other Ambulatory Visit: Payer: Self-pay | Admitting: Adult Health

## 2023-05-19 DIAGNOSIS — F331 Major depressive disorder, recurrent, moderate: Secondary | ICD-10-CM

## 2023-05-22 ENCOUNTER — Telehealth (INDEPENDENT_AMBULATORY_CARE_PROVIDER_SITE_OTHER): Payer: 59 | Admitting: Family

## 2023-05-22 ENCOUNTER — Encounter: Payer: Self-pay | Admitting: Dietician

## 2023-05-22 ENCOUNTER — Other Ambulatory Visit: Payer: Self-pay | Admitting: Family

## 2023-05-22 ENCOUNTER — Ambulatory Visit: Payer: 59 | Admitting: Family

## 2023-05-22 ENCOUNTER — Encounter: Payer: 59 | Attending: Family | Admitting: Dietician

## 2023-05-22 ENCOUNTER — Encounter: Payer: Self-pay | Admitting: Family

## 2023-05-22 VITALS — Ht 64.5 in | Wt 232.1 lb

## 2023-05-22 VITALS — Ht 64.5 in | Wt 232.0 lb

## 2023-05-22 DIAGNOSIS — E6609 Other obesity due to excess calories: Secondary | ICD-10-CM

## 2023-05-22 DIAGNOSIS — E782 Mixed hyperlipidemia: Secondary | ICD-10-CM

## 2023-05-22 DIAGNOSIS — I10 Essential (primary) hypertension: Secondary | ICD-10-CM | POA: Diagnosis not present

## 2023-05-22 DIAGNOSIS — Z6839 Body mass index (BMI) 39.0-39.9, adult: Secondary | ICD-10-CM | POA: Insufficient documentation

## 2023-05-22 DIAGNOSIS — Z713 Dietary counseling and surveillance: Secondary | ICD-10-CM | POA: Insufficient documentation

## 2023-05-22 DIAGNOSIS — E669 Obesity, unspecified: Secondary | ICD-10-CM

## 2023-05-22 DIAGNOSIS — Z6834 Body mass index (BMI) 34.0-34.9, adult: Secondary | ICD-10-CM

## 2023-05-22 DIAGNOSIS — E785 Hyperlipidemia, unspecified: Secondary | ICD-10-CM | POA: Diagnosis not present

## 2023-05-22 DIAGNOSIS — F411 Generalized anxiety disorder: Secondary | ICD-10-CM | POA: Diagnosis not present

## 2023-05-22 DIAGNOSIS — R7303 Prediabetes: Secondary | ICD-10-CM | POA: Diagnosis present

## 2023-05-22 DIAGNOSIS — M069 Rheumatoid arthritis, unspecified: Secondary | ICD-10-CM | POA: Insufficient documentation

## 2023-05-22 DIAGNOSIS — M797 Fibromyalgia: Secondary | ICD-10-CM | POA: Diagnosis not present

## 2023-05-22 DIAGNOSIS — E66811 Obesity, class 1: Secondary | ICD-10-CM

## 2023-05-22 MED ORDER — WEGOVY 1 MG/0.5ML ~~LOC~~ SOAJ: SUBCUTANEOUS | 0 refills | Status: DC

## 2023-05-22 MED ORDER — WEGOVY 0.5 MG/0.5ML ~~LOC~~ SOAJ: 0.5000 mg | SUBCUTANEOUS | 0 refills | Status: DC

## 2023-05-22 NOTE — Assessment & Plan Note (Signed)
Increase wegovy to 0.5 weekly  If tolerating well can continue increase Constipation possible s/e did discuss pt that tramadol and decreased movement may also be contributing but if does not improve or worsens with increased dose will re evaluate as we may either need to decrease dose or stop completely. Recommendation for miralax prn and to increase water intake as well as fiber.  Continue with dietetian as scheduled, exercise as tolerated.

## 2023-05-22 NOTE — Patient Instructions (Signed)
Include protein sources with each meal; eggs, beans, nuts, chickpeas/ hummus. Eat a veg or fruit or both with each meal. Check into frozen and canned options that can be kept on hand for convenience and cost savings.

## 2023-05-22 NOTE — Progress Notes (Signed)
Medical Nutrition Therapy: Visit start time: 0930  end time: 1030  Assessment:   Referral Diagnosis: hyperlipidemia, HTN, prediabetes Other medical history/ diagnoses: fibromyalgia, rheumatoid arthritis, migraines Psychosocial issues/ stress concerns: generalized anxiety  Medications, supplements: reconciled list in medical record   Current weight: 232.1lbs Height: 5'4.5"    BMI: 39.22   Progress and evaluation:  Patient's main concern is blood sugar, has been in prediabetes range for several years.  Has worked on healthier habits by reducing portions.  Avoiding gluten is a bit more difficult; daughter and 2 grandchildren share the home and budget for food is tight, with reliance on food stamps Has had nausea and headache when consuming artificial sweeteners including aspartame, monk fruit, stevia, and others. She is trying to gradually reduce sugar added to foods and beverages. Labs done 02/17/23: HbA1C 6.2%, total cholesterol 234, HDL 54, LDL 145, triglycerides 194; Vit D in normal range at 42.5 (with supplementation) Food allergies: reports gluten and lactose intolerances; onions are migraine trigger     Dietary Intake:  Usual eating pattern includes 3 meals and 2 snacks per day. Dining out frequency: 1-2 meals per week. Who plans meals/ buys groceries? Daughter (food stamps)  Who prepares meals? Self, daughter  Breakfast: 16oz coffee with flavored oat milk creamer with GF waffle or pancake or toast, occ with [place] bacon for protein Snack: none Lunch: salad or soup and salad (craving veg recently) Snack: homemade popcorn with avocado oil; loves cheese Supper: daughter cooks -- Physiological scientist friendly so often pasta/ rice Snack: sometimes craves sweets ie popsicle or ice cream Beverages: water, coffee  Physical activity: very active prior to arthritis and fibromyalgia diagnoses. Trying to do some stretches at least 2x a day, Asian granny movements   Intervention:   Nutrition Care  Education:   Basic nutrition: basic food groups; appropriate nutrient balance; appropriate meal and snack schedule; general nutrition guidelines    Weight control: benefits of weight loss on blood sugar, heart health, chronic pain; importance of low sugar and low fat choices; appropriate food portions; estimated energy needs for weight loss at 1400 kcal, provided guidance for 45% CHO, 25% pro, (30% fat) prediabetes:  appropriate meal and snack schedule; appropriate carb intake and balance, healthy carb choices; role of fiber, protein, fat; physical activity; suitable options for sweetening foods/ beverages  Hypertension: identifying food sources of potassium, magnesium; options for seasoning foods Hyperlipidemia:  target goals for lipids; healthy and unhealthy fats; role of fiber; role of exercise Food intolerances:  gluten free food options, lower cost options  Other intervention notes: Patient has some basic nutrition knowledge; she has been working on positive diet changes, and is motivated to continue.  Established additional goals for change with direction from patient.   Nutritional Diagnosis:  [place]-2.1 Inpaired nutrition utilization and [place]-2.2 Altered nutrition-related laboratory As related to prediabetes, hyperlipidemia, and hypertension.  As evidenced by elevated HbA1C, elevated total cholesterol, LDL, and triglycerides; BP controlled with medications. -3.3 Overweight/obesity As related to limited physical activity, history of excess calories, possibly stress.  As evidenced by patient with current BMI of 39.22.   Education Materials given:  General diet guidelines for Diabetes Plate Planner with food lists, sample meal pattern Visit summary with goals/ instructions to be viewed via patient portal   Learner/ who was taught:  Patient   Level of understanding: Verbalizes/ demonstrates competency  Demonstrated degree of understanding via:   Teach back Learning  barriers: None  Willingness to learn/ readiness for change: Eager, change in progress  Monitoring and Evaluation:  Dietary intake, exercise, BG control, blood lipids and BP, and body weight      follow up:  07/03/23 at 11:45am telehealth

## 2023-05-22 NOTE — Progress Notes (Signed)
Virtual Visit via Video note  I connected with Patricia Bauer on 05/22/23 at home by video and verified that I am speaking with the correct person using two identifiers.The provider, Mort Sawyers, FNP is located in their office at time of visit.  I discussed the limitations, risks, security and privacy concerns of performing an evaluation and management service by video and the availability of in person appointments. I also discussed with the patient that there may be a patient responsible charge related to this service. The patient expressed understanding and agreed to proceed.  Subjective: PCP: Mort Sawyers, FNP  Chief Complaint  Patient presents with   Medical Management of Chronic Issues    HPI  Obesity: was on wegovy 0.25 mg weekly but ran out last week and her last injection was last Monday. Has noticed some nausea and constipation since starting. She does not have a h/o constipation if it gets 'real bad' it's about two to three days. The nausea lasts for a day or two after the shot mainly at night time. 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 She does notice a decrease in appetite.   Wt Readings from Last 3 Encounters:  05/22/23 232 lb (105.2 kg)  05/22/23 232 lb 1.6 oz (105.3 kg)  02/17/23 230 lb (104.3 kg)   She has increased the amount of walking on a daily basis limited due to wearing a boot on her foot. Does take tramadol prn for the pain.             ROS: Per HPI  Current Outpatient Medications:    ALPRAZolam (XANAX) 0.25 MG tablet, TAKE 1 TABLET(0.25 MG) BY MOUTH DAILY AS NEEDED FOR ANXIETY OR SLEEP, Disp: 30 tablet, Rfl: 2   amLODipine (NORVASC) 10 MG tablet, TAKE 1 TABLET(10 MG) BY MOUTH DAILY, Disp: 90 tablet, Rfl: 3   amoxicillin (AMOXIL) 500 MG capsule, TAKE 4 CAPSULE BY MOUTH 1 HR PRIOR TO DENTAL APPT, Disp: , Rfl:    ARIPiprazole (ABILIFY) 2 MG tablet, TAKE 1 TABLET(2 MG)  BY MOUTH DAILY, Disp: 30 tablet, Rfl: 0   atorvastatin (LIPITOR) 10 MG tablet, Take 1 tablet (10 mg total) by mouth daily., Disp: 90 tablet, Rfl: 3   busPIRone (BUSPAR) 10 MG tablet, Take 1 tablet (10 mg total) by mouth 3 (three) times daily., Disp: 90 tablet, Rfl: 5   cyclobenzaprine (FLEXERIL) 5 MG tablet, Take ONE tab PO BID/TID PRN, Disp: , Rfl:    famotidine (PEPCID) 20 MG tablet, TAKE 1 TABLET BY MOUTH AFTER SUPPER, Disp: 30 tablet, Rfl: 11   gabapentin (NEURONTIN) 300 MG capsule, Take 1 capsule (300 mg total) by mouth 2 (two) times daily., Disp: 60 capsule, Rfl: 2   meloxicam (MOBIC) 15 MG tablet, TAKE 1 TABLET(15 MG) BY MOUTH DAILY, Disp: 30 tablet, Rfl: 3   SUMAtriptan (IMITREX) 50 MG tablet, TAKE 1 TABLET MAY REPEAT IN 2 HOURS IF HEADACHE PERSISTS OR RECURS. DON'T USE MORE THAN TWO IN 24HRS, Disp: 10 tablet, Rfl: 2   traMADol (ULTRAM) 50 MG tablet, TAKE 1 TABLET(50 MG) BY MOUTH EVERY 4 HOURS FOR UP TO 5 DAYS AS NEEDED FOR COUGH OR PAIN, Disp: 30 tablet, Rfl: 0   venlafaxine XR (EFFEXOR XR) 150 MG 24 hr capsule, Take 1 capsule (150 mg total) by mouth daily with breakfast., Disp: 30 capsule, Rfl: 5   venlafaxine XR (EFFEXOR XR) 75 MG 24 hr capsule,  Take 1 capsule (75 mg total) by mouth daily with breakfast., Disp: 30 capsule, Rfl: 5   Vitamin D-Vitamin K (VITAMIN K2-VITAMIN D3 PO), Take by mouth., Disp: , Rfl:    Semaglutide-Weight Management (WEGOVY) 0.5 MG/0.5ML SOAJ, Inject 0.5 mg into the skin once a week., Disp: 2 mL, Rfl: 0   [START ON 06/12/2023] Semaglutide-Weight Management (WEGOVY) 1 MG/0.5ML SOAJ, Inject 1 mg qweek [place] after completing four weeks of 0.5 mg dosing QW. After four weeks of 1 mg increase to 1.7 mg QW with new RX, Disp: 2 mL, Rfl: 0  Observations/Objective: Physical Exam Constitutional:      General: She is not in acute distress.    Appearance: Normal appearance. She is obese. She is not ill-appearing, toxic-appearing or diaphoretic.  HENT:     Head: Normocephalic.   Cardiovascular:     Rate and Rhythm: Normal rate.  Pulmonary:     Effort: Pulmonary effort is normal.  Musculoskeletal:        General: Normal range of motion.  Neurological:     General: No focal deficit present.     Mental Status: She is alert and oriented to person, place, and time. Mental status is at baseline.  Psychiatric:        Mood and Affect: Mood normal.        Behavior: Behavior normal.        Thought Content: Thought content normal.        Judgment: Judgment normal.     Assessment and Plan: Primary hypertension -     MJ.Hock; Inject 1 mg qweek [place] after completing four weeks of 0.5 mg dosing QW. After four weeks of 1 mg increase to 1.7 mg QW with new RX  Dispense: 2 mL; Refill: 0 -     AOZHYQ; Inject 0.5 mg into the skin once a week.  Dispense: 2 mL; Refill: 0  Obesity (BMI 35.0-39.9 without comorbidity) -     MVHQIO; Inject 1 mg qweek [place] after completing four weeks of 0.5 mg dosing QW. After four weeks of 1 mg increase to 1.7 mg QW with new RX  Dispense: 2 mL; Refill: 0 -     NGEXBM; Inject 0.5 mg into the skin once a week.  Dispense: 2 mL; Refill: 0  Prediabetes -     WUXLKG; Inject 1 mg qweek [place] after completing four weeks of 0.5 mg dosing QW. After four weeks of 1 mg increase to 1.7 mg QW with new RX  Dispense: 2 mL; Refill: 0 -     MWNUUV; Inject 0.5 mg into the skin once a week.  Dispense: 2 mL; Refill: 0  Mixed hyperlipidemia -     OZDGUY; Inject 1 mg qweek [place] after completing four weeks of 0.5 mg dosing QW. After four weeks of 1 mg increase to 1.7 mg QW with new RX  Dispense: 2 mL; Refill: 0 -     QIHKVQ; Inject 0.5 mg into the skin once a week.  Dispense: 2 mL; Refill: 0  Class 1 obesity due to excess calories with serious comorbidity and body mass index (BMI) of 34.0 to 34.9 in adult Assessment & Plan: Increase wegovy to 0.5 weekly  If tolerating well can continue increase Constipation possible s/e did discuss pt that tramadol and decreased movement may  also be contributing but if does not improve or worsens with increased dose will re evaluate as we may either need to decrease dose or stop completely. Recommendation for miralax prn and  to increase water intake as well as fiber.  Continue with dietetian as scheduled, exercise as tolerated.      Follow Up Instructions: Return in about 3 months (around 08/19/2023) for f/u diabetes.   I discussed the assessment and treatment plan with the patient. The patient was provided an opportunity to ask questions and all were answered. The patient agreed with the plan and demonstrated an understanding of the instructions.   The patient was advised to call back or seek an in-person evaluation if the symptoms worsen or if the condition fails to improve as anticipated.  The above assessment and management plan was discussed with the patient. The patient verbalized understanding of and has agreed to the management plan. Patient is aware to call the clinic if symptoms persist or worsen. Patient is aware when to return to the clinic for a follow-up visit. Patient educated on when it is appropriate to go to the emergency department.     Mort Sawyers, MSN, APRN, FNP-C [place] [facility]

## 2023-05-23 ENCOUNTER — Encounter: Payer: Self-pay | Admitting: Family

## 2023-05-23 ENCOUNTER — Telehealth (INDEPENDENT_AMBULATORY_CARE_PROVIDER_SITE_OTHER): Payer: 59 | Admitting: Adult Health

## 2023-05-23 ENCOUNTER — Encounter: Payer: Self-pay | Admitting: Adult Health

## 2023-05-23 DIAGNOSIS — F411 Generalized anxiety disorder: Secondary | ICD-10-CM | POA: Diagnosis not present

## 2023-05-23 DIAGNOSIS — F4000 Agoraphobia, unspecified: Secondary | ICD-10-CM | POA: Diagnosis not present

## 2023-05-23 DIAGNOSIS — F410 Panic disorder [episodic paroxysmal anxiety] without agoraphobia: Secondary | ICD-10-CM | POA: Diagnosis not present

## 2023-05-23 DIAGNOSIS — F331 Major depressive disorder, recurrent, moderate: Secondary | ICD-10-CM | POA: Diagnosis not present

## 2023-05-23 MED ORDER — ALPRAZOLAM 0.25 MG PO TABS: ORAL_TABLET | ORAL | 2 refills | Status: DC

## 2023-05-23 MED ORDER — ARIPIPRAZOLE 2 MG PO TABS: 2.0000 mg | ORAL_TABLET | Freq: Every day | ORAL | 2 refills | Status: DC

## 2023-05-23 NOTE — Progress Notes (Signed)
 Patricia Bauer [id number] [birth year] 53 y.o.  Virtual Visit via Video Note  I connected with pt @ on 05/23/23 at 12:00 PM EST by a video enabled telemedicine application and verified that I am speaking with the correct person using two identifiers.   I discussed the limitations of evaluation and management by telemedicine and the availability of in person appointments. The patient expressed understanding and agreed to proceed.  I discussed the assessment and treatment plan with the patient. The patient was provided an opportunity to ask questions and all were answered. The patient agreed with the plan and demonstrated an understanding of the instructions.   The patient was advised to call back or seek an in-person evaluation if the symptoms worsen or if the condition fails to improve as anticipated.  I provided 25 minutes of non-face-to-face time during this encounter.  The patient was located at home.  The provider was located at [facility].   [name], NP   Subjective:   Patient ID:  Patricia Bauer is a 53 y.o. (DOB 11-05-1970) female.  Chief Complaint: No chief complaint on file.   HPI Patricia Bauer presents for follow-up of MDD, GAD, insomnia, panic attacks, and agoraphobia.   Describes mood today as ok. Pleasant. Denies tearfulness. Mood symptoms - denies depression. Improved interest and motivation. Reports decreased anxiety and irritability. Denies panic attacks. Reports decreased worry, rumination, and over thinking. Reports decreased obsessive acts - decreased skin picking. Reporting chronic pain issues - fibromyalgia. Stating I feel like I'm doing better. Feels like medications are helpful. Taking medications as prescribed.  Energy levels improved. Active, does not have a regular exercise routine with physical disabilities. Enjoys some usual interests and activities. Single. Divorced. Lives with daughter (61) 2 children. Mother in memory care  unit. Spending time with family and friends. Appetite adequate. Reporting weight gain - 2 pounds. Has started on Wegovy . Sleeps better some nights than others. Averages 7 hours - diagnosed with mild sleep apnea - unable to afford CPAP machine. Focus and concentration improved - much better Completing tasks. Managing aspects of household. Works full time for [employer] - remote - day shift. Denies SI or HI.  Denies AH or VH. Denies self harm - skin picking. Denies substance.  Therapist - Donzell Rend - x 2 years.  Previous medication trials:  Celexa , Zoloft - flat, Wellbutrin   Review of Systems:  Review of Systems  Musculoskeletal:  Negative for gait problem.  Neurological:  Negative for tremors.  Psychiatric/Behavioral:         Please refer to HPI    Medications: I have reviewed the patient's current medications.  Current Outpatient Medications  Medication Sig Dispense Refill   ALPRAZolam  (XANAX ) 0.25 MG tablet TAKE 1 TABLET(0.25 MG) BY MOUTH DAILY AS NEEDED FOR ANXIETY OR SLEEP 30 tablet 2   amLODipine  (NORVASC ) 10 MG tablet TAKE 1 TABLET(10 MG) BY MOUTH DAILY 90 tablet 3   amoxicillin  (AMOXIL ) 500 MG capsule TAKE 4 CAPSULE BY MOUTH 1 HR PRIOR TO DENTAL APPT     ARIPiprazole  (ABILIFY ) 2 MG tablet TAKE 1 TABLET(2 MG) BY MOUTH DAILY 30 tablet 0   atorvastatin  (LIPITOR) 10 MG tablet Take 1 tablet (10 mg total) by mouth daily. 90 tablet 3   busPIRone  (BUSPAR ) 10 MG tablet Take 1 tablet (10 mg total) by mouth 3 (three) times daily. 90 tablet 5   cyclobenzaprine  (FLEXERIL ) 5 MG tablet Take ONE tab PO BID/TID PRN     famotidine  (  PEPCID ) 20 MG tablet TAKE 1 TABLET BY MOUTH AFTER SUPPER 30 tablet 11   gabapentin  (NEURONTIN ) 300 MG capsule Take 1 capsule (300 mg total) by mouth 2 (two) times daily. 60 capsule 2   meloxicam  (MOBIC ) 15 MG tablet TAKE 1 TABLET(15 MG) BY MOUTH DAILY 30 tablet 3   Semaglutide -Weight Management (WEGOVY ) 0.5 MG/0.5ML SOAJ Inject 0.5 mg into the skin once a week.  2 mL 0   [START ON 06/12/2023] Semaglutide -Weight Management (WEGOVY ) 1 MG/0.5ML SOAJ Inject 1 mg qweek [place] after completing four weeks of 0.5 mg dosing QW. After four weeks of 1 mg increase to 1.7 mg QW with new RX 2 mL 0   SUMAtriptan  (IMITREX ) 50 MG tablet TAKE 1 TABLET MAY REPEAT IN 2 HOURS IF HEADACHE PERSISTS OR RECURS. DON'T USE MORE THAN TWO IN 24HRS 10 tablet 2   traMADol  (ULTRAM ) 50 MG tablet TAKE 1 TABLET(50 MG) BY MOUTH EVERY 4 HOURS FOR UP TO 5 DAYS AS NEEDED FOR COUGH OR PAIN 30 tablet 0   venlafaxine  XR (EFFEXOR  XR) 150 MG 24 hr capsule Take 1 capsule (150 mg total) by mouth daily with breakfast. 30 capsule 5   venlafaxine  XR (EFFEXOR  XR) 75 MG 24 hr capsule Take 1 capsule (75 mg total) by mouth daily with breakfast. 30 capsule 5   Vitamin D -Vitamin K (VITAMIN K2-VITAMIN D3 PO) Take by mouth.     No current facility-administered medications for this visit.    Medication Side Effects: None  Allergies:  Allergies  Allergen Reactions   Citalopram  Other (See Comments)   Ibuprofen  Swelling    Bilateral swelling of ankles and feet   Diflucan [Fluconazole] Other (See Comments)    Pelvic pain   Onion Other (See Comments)    migraines   Topamax [Topiramate] Other (See Comments)    Reaction:  Body aches     Omeprazole Other (See Comments)    Abdominal pain    Hydrocodone Anxiety, Palpitations and Other (See Comments)    Reaction:  Migraines     Past Medical History:  Diagnosis Date   Anemia    Anxiety    Colon polyp    Depression    GERD (gastroesophageal reflux disease)    Headache    migraines    History of hysterectomy    still has both ovaries and cervix   History of migraine headaches    History of ovarian cyst    Hypertension    PONV (postoperative nausea and vomiting)     Family History  Problem Relation Age of Onset   Stroke Father    Heart attack Father    Hyperlipidemia Mother    Hypertension Mother    Breast cancer Maternal Aunt 23   Colon cancer  Neg Hx    Colon polyps Neg Hx    Kidney disease Neg Hx    Gallbladder disease Neg Hx    Esophageal cancer Neg Hx    Alcohol abuse Neg Hx    Drug abuse Neg Hx    Depression Neg Hx    Bipolar disorder Neg Hx    Anxiety disorder Neg Hx    Schizophrenia Neg Hx    Suicidality Neg Hx     Social History   Socioeconomic History   Marital status: Single    Spouse name: Not on file   Number of children: 1   Years of education: Not on file   Highest education level: Some college, no degree  Occupational History  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[employer]  Tobacco Use   Smoking status: Never   Smokeless tobacco: Never  Vaping Use   Vaping status: Never Used  Substance and Sexual Activity   Alcohol use: Yes    Comment: occassional- once a month has about 2-3 drinks   Drug use: No   Sexual activity: Not Currently  Other Topics Concern   Not on file  Social History Narrative   Not on file   Social Drivers of Health   Financial Resource Strain: Low Risk  (05/21/2023)   Overall Financial Resource Strain (CARDIA)    Difficulty of Paying Living Expenses: Not very hard  Food Insecurity: Food Insecurity Present (05/21/2023)   Hunger Vital Sign    Worried About Running Out of Food in the Last Year: Sometimes true    Ran Out of Food in the Last Year: Sometimes true  Transportation Needs: Unmet Transportation Needs (05/21/2023)   PRAPARE - Administrator, Civil Service (Medical): Yes    Lack of Transportation (Non-Medical): No  Physical Activity: Unknown (05/21/2023)   Exercise Vital Sign    Days of Exercise per Week: 0 days    Minutes of Exercise per Session: Not on file  Stress: Stress Concern Present (05/21/2023)   [employer] of Occupational Health - Occupational Stress Questionnaire    Feeling of Stress : To some extent  Social Connections: Moderately Isolated (05/21/2023)   Social Connection and Isolation Panel [NHANES]    Frequency of Communication with Friends and  Family: Never    Frequency of Social Gatherings with Friends and Family: Once a week    Attends Religious Services: More than 4 times per year    Active Member of [employer] or Organizations: Yes    Attends Engineer, Structural: More than 4 times per year    Marital Status: Divorced  Catering Manager Violence: Not on file    Past Medical History, Surgical history, Social history, and Family history were reviewed and updated as appropriate.   Please see review of systems for further details on the patient's review from today.   Objective:   Physical Exam:  LMP 09/07/2009   Physical Exam Constitutional:      General: She is not in acute distress. Musculoskeletal:        General: No deformity.  Neurological:     Mental Status: She is alert and oriented to person, place, and time.     Coordination: Coordination normal.  Psychiatric:        Attention and Perception: Attention and perception normal. She does not perceive auditory or visual hallucinations.        Mood and Affect: Mood normal. Mood is not anxious or depressed. Affect is not labile, blunt, angry or inappropriate.        Speech: Speech normal.        Behavior: Behavior normal.        Thought Content: Thought content normal. Thought content is not paranoid or delusional. Thought content does not include homicidal or suicidal ideation. Thought content does not include homicidal or suicidal plan.        Cognition and Memory: Cognition and memory normal.        Judgment: Judgment normal.     Comments: Insight intact     Lab Review:     Component Value Date/Time   NA 137 02/17/2023 0823   NA 139 03/13/2013 2022   K 4.5 02/17/2023 0823   K 3.6 03/13/2013 2022   CL  100 02/17/2023 0823   CL 106 03/13/2013 2022   CO2 22 02/17/2023 0823   CO2 26 03/13/2013 2022   GLUCOSE 117 (H) 02/17/2023 0823   GLUCOSE 110 (H) 05/04/2020 0503   GLUCOSE 94 03/13/2013 2022   BUN 13 02/17/2023 0823   BUN 11 03/13/2013 2022    CREATININE 0.76 02/17/2023 0823   CREATININE 1.05 (H) 05/24/2019 1359   CREATININE 0.80 03/13/2013 2022   CALCIUM  9.3 02/17/2023 0823   CALCIUM  8.7 03/13/2013 2022   PROT 7.1 02/17/2023 0823   ALBUMIN 4.4 02/17/2023 0823   AST 19 02/17/2023 0823   AST 27 05/24/2019 1359   ALT 27 02/17/2023 0823   ALT 32 05/24/2019 1359   ALKPHOS 89 02/17/2023 0823   BILITOT 0.3 02/17/2023 0823   BILITOT 0.3 05/24/2019 1359   GFRNONAA >60 05/04/2020 0503   GFRNONAA >60 05/24/2019 1359   GFRNONAA >60 03/13/2013 2022   GFRAA 89 12/31/2019 1451   GFRAA >60 05/24/2019 1359   GFRAA >60 03/13/2013 2022       Component Value Date/Time   WBC 8.8 06/10/2022 1338   WBC 8.1 05/14/2021 1429   RBC 4.64 06/10/2022 1338   RBC 4.62 06/10/2022 1338   HGB 14.1 06/10/2022 1338   HGB 11.9 01/17/2019 1519   HGB 12.0 01/17/2019 1519   HCT 43.1 06/10/2022 1338   HCT 36.1 01/17/2019 1519   HCT 36.5 01/17/2019 1519   PLT 329 06/10/2022 1338   PLT 333 01/17/2019 1519   PLT 338 01/17/2019 1519   MCV 93.3 06/10/2022 1338   MCV 82 01/17/2019 1519   MCV 83 01/17/2019 1519   MCV 85 03/13/2013 2022   MCH 30.5 06/10/2022 1338   MCHC 32.7 06/10/2022 1338   RDW 12.2 06/10/2022 1338   RDW 14.1 01/17/2019 1519   RDW 14.2 01/17/2019 1519   RDW 13.4 03/13/2013 2022   LYMPHSABS 2.8 06/10/2022 1338   LYMPHSABS 2.8 01/17/2019 1519   LYMPHSABS 3.0 01/17/2019 1519   MONOABS 0.4 06/10/2022 1338   EOSABS 0.1 06/10/2022 1338   EOSABS 0.2 01/17/2019 1519   EOSABS 0.2 01/17/2019 1519   BASOSABS 0.0 06/10/2022 1338   BASOSABS 0.0 01/17/2019 1519   BASOSABS 0.0 01/17/2019 1519    No results found for: POCLITH, LITHIUM   No results found for: PHENYTOIN, PHENOBARB, VALPROATE, CBMZ   .res Assessment: Plan:    Plan:  PDMP reviewed  Abilify  2mg  daily Effexor  XR 150 and 75mg  every morning Xanax  0.25mg  as needed - takes once a week Buspar  10mg  TID  RTC 4 weeks  25 minutes spent dedicated to the care of  this patient on the date of this encounter to include pre-visit review of records, ordering of medication, post visit documentation, and face-to-face time with the patient discussing depression, anxiety and OCD. Discussed continuing current medication regimen.  Patient advised to contact office with any questions, adverse effects, or acute worsening in signs and symptoms.  Discussed potential benefits, risk, and side effects of benzodiazepines to include potential risk of tolerance and dependence, as well as possible drowsiness.  Advised patient not to drive if experiencing drowsiness and to take lowest possible effective dose to minimize risk of dependence and tolerance.  There are no diagnoses linked to this encounter.   Please see After Visit Summary for patient specific instructions.  Future Appointments  Date Time Provider Department Center  05/23/2023 12:00 PM Kamoria Lucien, Angeline Mattocks, NP [facility] None  06/01/2023  3:30 PM [facility] CLINICAL SUPPORT [facility]  06/16/2023 12:45 PM [facility] LAB [facility] None  06/16/2023  1:00 PM Franchot Lauraine HERO, NP [facility] None  07/03/2023 11:45 AM Gail Sharlet KIDD, RD [facility] None    No orders of the defined types were placed in this encounter.     -------------------------------

## 2023-05-24 DIAGNOSIS — R12 Heartburn: Secondary | ICD-10-CM | POA: Insufficient documentation

## 2023-05-31 ENCOUNTER — Other Ambulatory Visit (HOSPITAL_COMMUNITY): Payer: Self-pay

## 2023-05-31 ENCOUNTER — Encounter: Payer: Self-pay | Admitting: Family

## 2023-06-01 ENCOUNTER — Other Ambulatory Visit (HOSPITAL_COMMUNITY): Payer: Self-pay

## 2023-06-01 ENCOUNTER — Ambulatory Visit: Payer: 59

## 2023-06-01 ENCOUNTER — Encounter: Payer: Self-pay | Admitting: Family

## 2023-06-01 DIAGNOSIS — Z23 Encounter for immunization: Secondary | ICD-10-CM | POA: Diagnosis not present

## 2023-06-12 ENCOUNTER — Other Ambulatory Visit (HOSPITAL_COMMUNITY): Payer: Self-pay

## 2023-06-14 ENCOUNTER — Telehealth: Payer: Self-pay

## 2023-06-14 ENCOUNTER — Other Ambulatory Visit (HOSPITAL_COMMUNITY): Payer: Self-pay

## 2023-06-14 ENCOUNTER — Encounter: Payer: Self-pay | Admitting: Family

## 2023-06-14 NOTE — Telephone Encounter (Signed)
 Pharmacy Patient Advocate Encounter   Received notification from RX Request Messages that prior authorization for WEGOVY 0.5 MG/0.5 ML PEN is required/requested.   Insurance verification completed.   The patient is insured through [facility] .    Placed call to pt's pharmacy to obtain new billing info as [facility] recently changed billing info, and pharmacist ran test claim that stated that member MUST be enrolled in weight watcher's program through [employer], is member enrolled? If not, member should contact healthcare plan in order to be eligible for PA. No PA submitted at this time  This has been communicated to clinic in original request

## 2023-06-14 NOTE — Telephone Encounter (Signed)
 Placed call to pt's pharmacy to obtain new billing info as [facility] recently changed billing info, and pharmacist ran test claim that stated that member MUST be enrolled in weight watcher's program through [facility], is member enrolled? If not, member should contact healthcare plan in order to be eligible for PA. No PA submitted at this time, thank you.

## 2023-06-15 ENCOUNTER — Other Ambulatory Visit: Payer: Self-pay | Admitting: *Deleted

## 2023-06-15 DIAGNOSIS — D508 Other iron deficiency anemias: Secondary | ICD-10-CM

## 2023-06-15 NOTE — Telephone Encounter (Signed)
 Can we please ask pt this question in regards to the prior auth.

## 2023-06-16 ENCOUNTER — Encounter: Payer: Self-pay | Admitting: Family

## 2023-06-16 ENCOUNTER — Inpatient Hospital Stay: Payer: 59 | Attending: Family

## 2023-06-16 ENCOUNTER — Telehealth: Payer: Self-pay

## 2023-06-16 ENCOUNTER — Inpatient Hospital Stay (HOSPITAL_BASED_OUTPATIENT_CLINIC_OR_DEPARTMENT_OTHER): Payer: 59 | Admitting: Family

## 2023-06-16 VITALS — BP 146/89 | HR 103 | Temp 99.0°F | Resp 18 | Ht 65.0 in | Wt 235.0 lb

## 2023-06-16 DIAGNOSIS — D508 Other iron deficiency anemias: Secondary | ICD-10-CM

## 2023-06-16 DIAGNOSIS — K909 Intestinal malabsorption, unspecified: Secondary | ICD-10-CM | POA: Insufficient documentation

## 2023-06-16 DIAGNOSIS — D509 Iron deficiency anemia, unspecified: Secondary | ICD-10-CM | POA: Insufficient documentation

## 2023-06-16 LAB — CBC WITH DIFFERENTIAL (CANCER CENTER ONLY)
Abs Immature Granulocytes: 0.03 10*3/uL (ref 0.00–0.07)
Basophils Absolute: 0.1 10*3/uL (ref 0.0–0.1)
Basophils Relative: 1 %
Eosinophils Absolute: 0.1 10*3/uL (ref 0.0–0.5)
Eosinophils Relative: 1 %
HCT: 41.7 % (ref 36.0–46.0)
Hemoglobin: 14 g/dL (ref 12.0–15.0)
Immature Granulocytes: 0 %
Lymphocytes Relative: 34 %
Lymphs Abs: 3.4 10*3/uL (ref 0.7–4.0)
MCH: 31.2 pg (ref 26.0–34.0)
MCHC: 33.6 g/dL (ref 30.0–36.0)
MCV: 92.9 fL (ref 80.0–100.0)
Monocytes Absolute: 0.5 10*3/uL (ref 0.1–1.0)
Monocytes Relative: 5 %
Neutro Abs: 5.8 10*3/uL (ref 1.7–7.7)
Neutrophils Relative %: 59 %
Platelet Count: 309 10*3/uL (ref 150–400)
RBC: 4.49 MIL/uL (ref 3.87–5.11)
RDW: 12.5 % (ref 11.5–15.5)
WBC Count: 9.9 10*3/uL (ref 4.0–10.5)
nRBC: 0 % (ref 0.0–0.2)

## 2023-06-16 LAB — IRON AND IRON BINDING CAPACITY (CC-WL,HP ONLY)
Iron: 69 ug/dL (ref 28–170)
Saturation Ratios: 18 % (ref 10.4–31.8)
TIBC: 386 ug/dL (ref 250–450)
UIBC: 317 ug/dL (ref 148–442)

## 2023-06-16 LAB — RETICULOCYTES
Immature Retic Fract: 16.5 % — ABNORMAL HIGH (ref 2.3–15.9)
RBC.: 4.51 MIL/uL (ref 3.87–5.11)
Retic Count, Absolute: 122.7 10*3/uL (ref 19.0–186.0)
Retic Ct Pct: 2.7 % (ref 0.4–3.1)

## 2023-06-16 LAB — FERRITIN: Ferritin: 122 ng/mL (ref 11–307)

## 2023-06-16 NOTE — Telephone Encounter (Signed)
-----   Message from Patricia Bauer sent at 06/16/2023  4:02 PM EST ----- Please call and let her know that the iron level is okay.  [name]

## 2023-06-16 NOTE — Telephone Encounter (Signed)
 Advised via MyChart.

## 2023-06-16 NOTE — Telephone Encounter (Signed)
-----   Message from Josph Macho sent at 06/16/2023  4:02 PM EST ----- Please call and let her know that the iron level is okay.  [name]

## 2023-06-16 NOTE — Progress Notes (Signed)
 Hematology and Oncology Follow Up Visit  Patricia Bauer [id number] 25-May-1970 53 y.o. 06/16/2023   Principle Diagnosis:  Iron deficiency anemia secondary to malabsorption   Current Therapy:        IV iron as indicated   Interim History:  Patricia Bauer is here today for follow-up. She is feeling fatigued and has noted occasional palpitations.  No obvious blood loss noted. No abnormal bruising, no petechiae.  No fever, chills, n/v, cough, rash, dizziness, SOB, abdominal pain or changes in bowel or bladder habits.  Mild swelling in her feet and ankles. She is wearing compression stockings for added support. Pedal pulses 2+.  No falls or syncope reported.  Appetite and hydration are good. Weight is stable at 235 lbs.   ECOG Performance Status: 1 - Symptomatic but completely ambulatory  Medications:  Allergies as of 06/16/2023       Reactions   Citalopram Other (See Comments)   Ibuprofen Swelling   Bilateral swelling of ankles and feet   Diflucan [fluconazole] Other (See Comments)   Pelvic pain   Onion Other (See Comments)   migraines   Topamax [topiramate] Other (See Comments)   Reaction:  Body aches    Omeprazole Other (See Comments)   Abdominal pain    Hydrocodone Anxiety, Palpitations, Other (See Comments)   Reaction:  Migraines         Medication List        Accurate as of June 16, 2023 12:58 PM. If you have any questions, ask your nurse or doctor.          ALPRAZolam 0.25 MG tablet Commonly known as: XANAX TAKE 1 TABLET(0.25 MG) BY MOUTH DAILY AS NEEDED FOR ANXIETY OR SLEEP   amLODipine 10 MG tablet Commonly known as: NORVASC TAKE 1 TABLET(10 MG) BY MOUTH DAILY   amoxicillin 500 MG capsule Commonly known as: AMOXIL TAKE 4 CAPSULE BY MOUTH 1 HR PRIOR TO DENTAL APPT   ARIPiprazole 2 MG tablet Commonly known as: ABILIFY Take 1 tablet (2 mg total) by mouth daily.   atorvastatin 10 MG tablet Commonly known as: LIPITOR Take 1 tablet (10 mg total) by  mouth daily.   busPIRone 10 MG tablet Commonly known as: BUSPAR Take 1 tablet (10 mg total) by mouth 3 (three) times daily.   cyclobenzaprine 5 MG tablet Commonly known as: FLEXERIL Take ONE tab PO BID/TID PRN   famotidine 20 MG tablet Commonly known as: PEPCID TAKE 1 TABLET BY MOUTH AFTER SUPPER   gabapentin 300 MG capsule Commonly known as: Neurontin Take 1 capsule (300 mg total) by mouth 2 (two) times daily.   meloxicam 15 MG tablet Commonly known as: MOBIC TAKE 1 TABLET(15 MG) BY MOUTH DAILY   SUMAtriptan 50 MG tablet Commonly known as: IMITREX TAKE 1 TABLET MAY REPEAT IN 2 HOURS IF HEADACHE PERSISTS OR RECURS. DON'T USE MORE THAN TWO IN 24HRS   traMADol 50 MG tablet Commonly known as: ULTRAM TAKE 1 TABLET(50 MG) BY MOUTH EVERY 4 HOURS FOR UP TO 5 DAYS AS NEEDED FOR COUGH OR PAIN   venlafaxine XR 150 MG 24 hr capsule Commonly known as: Effexor XR Take 1 capsule (150 mg total) by mouth daily with breakfast.   venlafaxine XR 75 MG 24 hr capsule Commonly known as: Effexor XR Take 1 capsule (75 mg total) by mouth daily with breakfast.   VITAMIN K2-VITAMIN D3 PO Take by mouth.   Wegovy 0.5 MG/0.5ML Soaj Generic drug: Semaglutide-Weight Management Inject 0.5 mg into the skin  once a week.   Wegovy 1 MG/0.5ML Soaj Generic drug: Semaglutide-Weight Management Inject 1 mg qweek [place] after completing four weeks of 0.5 mg dosing QW. After four weeks of 1 mg increase to 1.7 mg QW with new RX        Allergies:  Allergies  Allergen Reactions   Citalopram Other (See Comments)   Ibuprofen Swelling    Bilateral swelling of ankles and feet   Diflucan [Fluconazole] Other (See Comments)    Pelvic pain   Onion Other (See Comments)    migraines   Topamax [Topiramate] Other (See Comments)    Reaction:  Body aches     Omeprazole Other (See Comments)    Abdominal pain    Hydrocodone Anxiety, Palpitations and Other (See Comments)    Reaction:  Migraines     Past Medical  History, Surgical history, Social history, and Family History were reviewed and updated.  Review of Systems: All other 10 point review of systems is negative.   Physical Exam:  vitals were not taken for this visit.   Wt Readings from Last 3 Encounters:  05/22/23 232 lb (105.2 kg)  05/22/23 232 lb 1.6 oz (105.3 kg)  02/17/23 230 lb (104.3 kg)    Ocular: Sclerae unicteric, pupils equal, round and reactive to light Ear-nose-throat: Oropharynx clear, dentition fair Lymphatic: No cervical or supraclavicular adenopathy Lungs no rales or rhonchi, good excursion bilaterally Heart regular rate and rhythm, no murmur appreciated Abd soft, nontender, positive bowel sounds MSK no focal spinal tenderness, no joint edema Neuro: non-focal, well-oriented, appropriate affect Breasts: Deferred   Lab Results  Component Value Date   WBC 8.8 06/10/2022   HGB 14.1 06/10/2022   HCT 43.1 06/10/2022   MCV 93.3 06/10/2022   PLT 329 06/10/2022   Lab Results  Component Value Date   FERRITIN 172 06/10/2022   IRON 78 06/10/2022   TIBC 368 06/10/2022   UIBC 290 06/10/2022   IRONPCTSAT 21 06/10/2022   Lab Results  Component Value Date   RETICCTPCT 2.0 06/10/2022   RBC 4.64 06/10/2022   RBC 4.62 06/10/2022   No results found for: "KPAFRELGTCHN", "LAMBDASER", "KAPLAMBRATIO" No results found for: "IGGSERUM", "IGA", "IGMSERUM" No results found for: "TOTALPROTELP", "ALBUMINELP", "A1GS", "A2GS", "BETS", "BETA2SER", "GAMS", "MSPIKE", "SPEI"   Chemistry      Component Value Date/Time   NA 137 02/17/2023 0823   NA 139 03/13/2013 2022   K 4.5 02/17/2023 0823   K 3.6 03/13/2013 2022   CL 100 02/17/2023 0823   CL 106 03/13/2013 2022   CO2 22 02/17/2023 0823   CO2 26 03/13/2013 2022   BUN 13 02/17/2023 0823   BUN 11 03/13/2013 2022   CREATININE 0.76 02/17/2023 0823   CREATININE 1.05 (H) 05/24/2019 1359   CREATININE 0.80 03/13/2013 2022   GLU 94 11/23/2017 0000      Component Value Date/Time    CALCIUM 9.3 02/17/2023 0823   CALCIUM 8.7 03/13/2013 2022   ALKPHOS 89 02/17/2023 0823   AST 19 02/17/2023 0823   AST 27 05/24/2019 1359   ALT 27 02/17/2023 0823   ALT 32 05/24/2019 1359   BILITOT 0.3 02/17/2023 0823   BILITOT 0.3 05/24/2019 1359       Impression and Plan:  Patricia Bauer is a very pleasant 54 yo caucasian female with iron deficiency anemia secondary to malabsorption.  Iron studies are pending. We will get her set up for IV iron if needed.  Follow-up in 1 year.  Eileen Stanford, NP  2/28/202512:58 PM

## 2023-06-16 NOTE — Telephone Encounter (Signed)
 Error - duplicate

## 2023-06-19 ENCOUNTER — Encounter (INDEPENDENT_AMBULATORY_CARE_PROVIDER_SITE_OTHER): Payer: Self-pay

## 2023-06-26 ENCOUNTER — Encounter (INDEPENDENT_AMBULATORY_CARE_PROVIDER_SITE_OTHER): Payer: Self-pay

## 2023-07-03 ENCOUNTER — Encounter: Payer: 59 | Attending: Family | Admitting: Dietician

## 2023-07-03 ENCOUNTER — Ambulatory Visit
Admission: RE | Admit: 2023-07-03 | Discharge: 2023-07-03 | Disposition: A | Discharge status: Home / Self Care | Source: Ambulatory Visit | Attending: Family | Admitting: Family

## 2023-07-03 DIAGNOSIS — Z1231 Encounter for screening mammogram for malignant neoplasm of breast: Secondary | ICD-10-CM | POA: Insufficient documentation

## 2023-07-03 DIAGNOSIS — I10 Essential (primary) hypertension: Secondary | ICD-10-CM | POA: Diagnosis present

## 2023-07-03 DIAGNOSIS — E782 Mixed hyperlipidemia: Secondary | ICD-10-CM | POA: Insufficient documentation

## 2023-07-03 DIAGNOSIS — R7303 Prediabetes: Secondary | ICD-10-CM | POA: Insufficient documentation

## 2023-07-03 NOTE — Progress Notes (Signed)
 Medical Nutrition Therapy: Visit start time: 1140  end time: 1155  Type of connection:  live, two-way audio and video Patient verbally consents to this visit: yes Location of patient: [place], [place] private setting Location of provider: [place], [place] private office setting Other people present: none    Assessment:  Diagnosis: prediabetes, hyperlipidemia, HTN Medical history changes: no changes Psychosocial issues/ stress concerns: generalized anxiety/ high stress  Medications, supplement changes: updated list in medical record   Current weight: not measured today (virtual visit); patient reports some weight gain  Progress and evaluation:  Some weight gain due to current unavailability of semaglutide Patient has been working on nutrition goals established at previous visit -- including protein source with meals, increasing vegetables and fruits She is using maple syrup to sweeten coffee, as natural sweetener vs granulated sugar or synthetic syrup Regular exercise has been difficult due to foot injury and now knee problems (same leg). She reports engaging in shorter duration exercise, as much as able.     Dietary Intake:  Usual eating pattern includes 3 meals and 2 snacks per day. Dining out frequency: 1-2 meals per week.  Physical activity: 10-15 minutes walking, stretches, resistance bands  Intervention:   Nutrition Care Education:  Basic nutrition: reviewed appropriate nutrient balance; appropriate meal and snack schedule; general nutrition guidelines    Weight control: reviewed progress since previous visit; effects of semaglutide, maintaining healthy habits regardless of weight measurement on scale, working with limitations for exercise Advanced nutrition:  recipe modification; cooking techniques; dining out; food label reading prediabetes:  reviewed appropriate carb intake and balance; role of fiber, protein  Other:  making gradual changes; changing 1-2 habits at a  time  Other Intervention Notes: Patient is motivated to continue with gradual positive changes in diet and activity.  Encouraged concentrating on health benefit of changes made when experiencing weight loss setbacks Patient does not feel she needs additional MNT follow up at this time; she will schedule later if needed.    Nutritional Diagnosis:   [place]-2.1 Inpaired nutrition utilization and [place]-2.2 Altered nutrition-related laboratory As related to prediabetes, hyperlipidemia, and hypertension.  As evidenced by elevated HbA1C, elevated total cholesterol, LDL, and triglycerides; BP controlled with medications. [place]-3.3 Overweight/obesity As related to limited physical activity, history of excess calories, possibly stress.  As evidenced by patient with current BMI of 39.22.    Learner/ who was taught:  Patient    Level of understanding: Verbalizes/ demonstrates competency   Demonstrated degree of understanding via:   Teach back Learning barriers: None   Willingness to learn/ readiness for change: Eager, change in progress   Monitoring and Evaluation:  Dietary intake, exercise, blood sugar control, blood lipids and BP, and body weight      follow up: prn

## 2023-07-03 NOTE — Patient Instructions (Signed)
 Continue to eat meals that include lean protein + vegetable and/or fruit + controlled/ small portions of starches  Keep up activity/ exercise as often as tolerated, increasing frequency and/or duration as fitness level increases and foot heals.

## 2023-07-05 ENCOUNTER — Encounter: Payer: Self-pay | Admitting: Family

## 2023-07-11 ENCOUNTER — Telehealth: Admitting: Adult Health

## 2023-07-11 ENCOUNTER — Encounter: Payer: Self-pay | Admitting: Adult Health

## 2023-07-11 DIAGNOSIS — F331 Major depressive disorder, recurrent, moderate: Secondary | ICD-10-CM | POA: Diagnosis not present

## 2023-07-11 DIAGNOSIS — F411 Generalized anxiety disorder: Secondary | ICD-10-CM | POA: Diagnosis not present

## 2023-07-11 DIAGNOSIS — F410 Panic disorder [episodic paroxysmal anxiety] without agoraphobia: Secondary | ICD-10-CM | POA: Diagnosis not present

## 2023-07-11 DIAGNOSIS — F4000 Agoraphobia, unspecified: Secondary | ICD-10-CM

## 2023-07-11 DIAGNOSIS — G4700 Insomnia, unspecified: Secondary | ICD-10-CM

## 2023-07-11 NOTE — Progress Notes (Signed)
 Patricia Bauer [id number] December 03, 1970 53 y.o.  Virtual Visit via Video Note  I connected with pt @ on 07/11/23 at  1:30 PM EDT by a video enabled telemedicine application and verified that I am speaking with the correct person using two identifiers.   I discussed the limitations of evaluation and management by telemedicine and the availability of in person appointments. The patient expressed understanding and agreed to proceed.  I discussed the assessment and treatment plan with the patient. The patient was provided an opportunity to ask questions and all were answered. The patient agreed with the plan and demonstrated an understanding of the instructions.   The patient was advised to call back or seek an in-person evaluation if the symptoms worsen or if the condition fails to improve as anticipated.  I provided 25 minutes of non-face-to-face time during this encounter.  The patient was located at home.  The provider was located at [facility].   Dorothyann Gibbs, NP   Subjective:   Patient ID:  Patricia Bauer is a 53 y.o. (DOB [birth year]) female.  Chief Complaint: No chief complaint on file.   HPI Patricia Bauer presents for follow-up of MDD, GAD, insomnia, panic attacks, and agoraphobia.   Describes mood today as "ok". Pleasant. Denies tearfulness. Mood symptoms - denies depression - "I feel like it is lurking in the background". Reports decreased interest and motivation - "having to push myself". Reports increased anxiety. Denies irritability. Reports panic attacks - "once every week and 1/2 - farther apart". Reports worry, rumination, and over thinking - "going over worst case scenarios in my head". Reports obsessive thoughts and acts - reports decreased skin picking - has started coloring instead. Reporting chronic pain issues - fibromyalgia. Her daughter recently moved out and will not tell her why - "we are not speaking". Reports she is trying to get her home ready  to sell - plans to move into a condo. Stating "overall, I feel like I'm doing better than I thought I would be". Feels like medications are helpful. Taking medications as prescribed.  Energy levels improved. Active, does not have a regular exercise routine with physical disabilities. Enjoys some usual interests and activities. Single. Divorced. Lives with daughter (2) 2 children. Mother in memory care unit. Spending time with family and friends. Appetite adequate. Weight stable. Still waiting to start the [facility]. Sleeps better some nights than others. Averages 7 to 8 hours - diagnosed with mild sleep apnea - unable to afford CPAP machine. Focus and concentration improved - "waining a little". Reports medication is no longer lasting throughout the day. Completing tasks. Managing aspects of household. Works full time for [employer] - remote - day shift. Denies SI or HI.  Denies AH or VH. Denies self harm - skin picking. Denies substance.  Therapist - Elisha Ponder - x 2 years.  Previous medication trials:  Celexa, Zoloft - flat, Wellbutrin  Review of Systems:  Review of Systems  Musculoskeletal:  Negative for gait problem.  Neurological:  Negative for tremors.  Psychiatric/Behavioral:         Please refer to HPI    Medications: I have reviewed the patient's current medications.  Current Outpatient Medications  Medication Sig Dispense Refill   ALPRAZolam (XANAX) 0.25 MG tablet TAKE 1 TABLET(0.25 MG) BY MOUTH DAILY AS NEEDED FOR ANXIETY OR SLEEP 30 tablet 2   amLODipine (NORVASC) 10 MG tablet TAKE 1 TABLET(10 MG) BY MOUTH DAILY 90 tablet 3   amoxicillin (AMOXIL) 500  MG capsule TAKE 4 CAPSULE BY MOUTH 1 HR PRIOR TO DENTAL APPT     ARIPiprazole (ABILIFY) 2 MG tablet Take 1 tablet (2 mg total) by mouth daily. 30 tablet 2   atorvastatin (LIPITOR) 10 MG tablet Take 1 tablet (10 mg total) by mouth daily. 90 tablet 3   busPIRone (BUSPAR) 10 MG tablet Take 1 tablet (10 mg total) by mouth 3 (three)  times daily. 90 tablet 5   cyclobenzaprine (FLEXERIL) 5 MG tablet Take ONE tab PO BID/TID PRN     famotidine (PEPCID) 20 MG tablet TAKE 1 TABLET BY MOUTH AFTER SUPPER 30 tablet 11   gabapentin (NEURONTIN) 300 MG capsule Take 1 capsule (300 mg total) by mouth 2 (two) times daily. 60 capsule 2   meloxicam (MOBIC) 15 MG tablet TAKE 1 TABLET(15 MG) BY MOUTH DAILY 30 tablet 3   Semaglutide-Weight Management (WEGOVY) 0.5 MG/0.5ML SOAJ Inject 0.5 mg into the skin once a week. 2 mL 0   Semaglutide-Weight Management (WEGOVY) 1 MG/0.5ML SOAJ Inject 1 mg qweek [place] after completing four weeks of 0.5 mg dosing QW. After four weeks of 1 mg increase to 1.7 mg QW with new RX 2 mL 0   SUMAtriptan (IMITREX) 50 MG tablet TAKE 1 TABLET MAY REPEAT IN 2 HOURS IF HEADACHE PERSISTS OR RECURS. DON'T USE MORE THAN TWO IN 24HRS 10 tablet 2   traMADol (ULTRAM) 50 MG tablet TAKE 1 TABLET(50 MG) BY MOUTH EVERY 4 HOURS FOR UP TO 5 DAYS AS NEEDED FOR COUGH OR PAIN 30 tablet 0   venlafaxine XR (EFFEXOR XR) 150 MG 24 hr capsule Take 1 capsule (150 mg total) by mouth daily with breakfast. 30 capsule 5   venlafaxine XR (EFFEXOR XR) 75 MG 24 hr capsule Take 1 capsule (75 mg total) by mouth daily with breakfast. 30 capsule 5   Vitamin D-Vitamin K (VITAMIN K2-VITAMIN D3 PO) Take by mouth.     No current facility-administered medications for this visit.    Medication Side Effects: None  Allergies:  Allergies  Allergen Reactions   Citalopram Other (See Comments)   Ibuprofen Swelling    Bilateral swelling of ankles and feet   Diflucan [Fluconazole] Other (See Comments)    Pelvic pain   Onion Other (See Comments)    migraines   Topamax [Topiramate] Other (See Comments)    Reaction:  Body aches     Omeprazole Other (See Comments)    Abdominal pain    Hydrocodone Anxiety, Palpitations and Other (See Comments)    Reaction:  Migraines     Past Medical History:  Diagnosis Date   Anemia    Anxiety    Colon polyp     Depression    GERD (gastroesophageal reflux disease)    Headache    migraines    History of hysterectomy    still has both ovaries and cervix   History of migraine headaches    History of ovarian cyst    Hypertension    PONV (postoperative nausea and vomiting)     Family History  Problem Relation Age of Onset   Stroke Father    Heart attack Father    Hyperlipidemia Mother    Hypertension Mother    Breast cancer Maternal Aunt 42   Colon cancer Neg Hx    Colon polyps Neg Hx    Kidney disease Neg Hx    Gallbladder disease Neg Hx    Esophageal cancer Neg Hx    Alcohol abuse Neg Hx  Drug abuse Neg Hx    Depression Neg Hx    Bipolar disorder Neg Hx    Anxiety disorder Neg Hx    Schizophrenia Neg Hx    Suicidality Neg Hx     Social History   Socioeconomic History   Marital status: Single    Spouse name: Not on file   Number of children: 1   Years of education: Not on file   Highest education level: Some college, no degree  Occupational History   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[employer]  Tobacco Use   Smoking status: Never   Smokeless tobacco: Never  Vaping Use   Vaping status: Never Used  Substance and Sexual Activity   Alcohol use: Yes    Comment: occassional- once a month has about 2-3 drinks   Drug use: No   Sexual activity: Not Currently  Other Topics Concern   Not on file  Social History Narrative   Not on file   Social Drivers of Health   Financial Resource Strain: Low Risk  (05/21/2023)   Overall Financial Resource Strain (CARDIA)    Difficulty of Paying Living Expenses: Not very hard  Food Insecurity: Low Risk  (06/14/2023)   Received from [facility]   Hunger Vital Sign    Worried About Running Out of Food in the Last Year: Never true    Ran Out of Food in the Last Year: Never true  Recent Concern: Food Insecurity - Food Insecurity Present (05/21/2023)   Hunger Vital Sign    Worried About Running Out of Food in the Last Year: Sometimes true    Ran  Out of Food in the Last Year: Sometimes true  Transportation Needs: No Transportation Needs (06/14/2023)   Received from [facility]    In the past 12 months, has lack of reliable transportation kept you from medical appointments, meetings, work or from getting things needed for daily living? : No  Recent Concern: Transportation Needs - Unmet Transportation Needs (05/21/2023)   PRAPARE - Administrator, Civil Service (Medical): Yes    Lack of Transportation (Non-Medical): No  Physical Activity: Unknown (05/21/2023)   Exercise Vital Sign    Days of Exercise per Week: 0 days    Minutes of Exercise per Session: Not on file  Stress: Stress Concern Present (05/21/2023)   [employer] of Occupational Health - Occupational Stress Questionnaire    Feeling of Stress : To some extent  Social Connections: Moderately Isolated (05/21/2023)   Social Connection and Isolation Panel [NHANES]    Frequency of Communication with Friends and Family: Never    Frequency of Social Gatherings with Friends and Family: Once a week    Attends Religious Services: More than 4 times per year    Active Member of [employer] or Organizations: Yes    Attends Engineer, structural: More than 4 times per year    Marital Status: Divorced  Catering manager Violence: Not on file    Past Medical History, Surgical history, Social history, and Family history were reviewed and updated as appropriate.   Please see review of systems for further details on the patient's review from today.   Objective:   Physical Exam:  LMP 09/07/2009   Physical Exam Constitutional:      General: She is not in acute distress. Musculoskeletal:        General: No deformity.  Neurological:     Mental Status: She is alert and oriented to person, place, and time.  Coordination: Coordination normal.  Psychiatric:        Attention and Perception: Attention and perception normal. She does not perceive auditory  or visual hallucinations.        Mood and Affect: Mood is not anxious. Affect is not labile, blunt, angry or inappropriate.        Speech: Speech normal.        Behavior: Behavior normal.        Thought Content: Thought content normal. Thought content is not paranoid or delusional. Thought content does not include homicidal or suicidal ideation. Thought content does not include homicidal or suicidal plan.        Cognition and Memory: Cognition and memory normal.        Judgment: Judgment normal.     Comments: Insight intact     Lab Review:     Component Value Date/Time   NA 137 02/17/2023 0823   NA 139 03/13/2013 2022   K 4.5 02/17/2023 0823   K 3.6 03/13/2013 2022   CL 100 02/17/2023 0823   CL 106 03/13/2013 2022   CO2 22 02/17/2023 0823   CO2 26 03/13/2013 2022   GLUCOSE 117 (H) 02/17/2023 0823   GLUCOSE 110 (H) 05/04/2020 0503   GLUCOSE 94 03/13/2013 2022   BUN 13 02/17/2023 0823   BUN 11 03/13/2013 2022   CREATININE 0.76 02/17/2023 0823   CREATININE 1.05 (H) 05/24/2019 1359   CREATININE 0.80 03/13/2013 2022   CALCIUM 9.3 02/17/2023 0823   CALCIUM 8.7 03/13/2013 2022   PROT 7.1 02/17/2023 0823   ALBUMIN 4.4 02/17/2023 0823   AST 19 02/17/2023 0823   AST 27 05/24/2019 1359   ALT 27 02/17/2023 0823   ALT 32 05/24/2019 1359   ALKPHOS 89 02/17/2023 0823   BILITOT 0.3 02/17/2023 0823   BILITOT 0.3 05/24/2019 1359   GFRNONAA >60 05/04/2020 0503   GFRNONAA >60 05/24/2019 1359   GFRNONAA >60 03/13/2013 2022   GFRAA 89 12/31/2019 1451   GFRAA >60 05/24/2019 1359   GFRAA >60 03/13/2013 2022       Component Value Date/Time   WBC 9.9 06/16/2023 1253   WBC 8.1 05/14/2021 1429   RBC 4.51 06/16/2023 1254   RBC 4.49 06/16/2023 1253   HGB 14.0 06/16/2023 1253   HGB 11.9 01/17/2019 1519   HGB 12.0 01/17/2019 1519   HCT 41.7 06/16/2023 1253   HCT 36.1 01/17/2019 1519   HCT 36.5 01/17/2019 1519   PLT 309 06/16/2023 1253   PLT 333 01/17/2019 1519   PLT 338 01/17/2019 1519    MCV 92.9 06/16/2023 1253   MCV 82 01/17/2019 1519   MCV 83 01/17/2019 1519   MCV 85 03/13/2013 2022   MCH 31.2 06/16/2023 1253   MCHC 33.6 06/16/2023 1253   RDW 12.5 06/16/2023 1253   RDW 14.1 01/17/2019 1519   RDW 14.2 01/17/2019 1519   RDW 13.4 03/13/2013 2022   LYMPHSABS 3.4 06/16/2023 1253   LYMPHSABS 2.8 01/17/2019 1519   LYMPHSABS 3.0 01/17/2019 1519   MONOABS 0.5 06/16/2023 1253   EOSABS 0.1 06/16/2023 1253   EOSABS 0.2 01/17/2019 1519   EOSABS 0.2 01/17/2019 1519   BASOSABS 0.1 06/16/2023 1253   BASOSABS 0.0 01/17/2019 1519   BASOSABS 0.0 01/17/2019 1519    No results found for: "POCLITH", "LITHIUM"   No results found for: "PHENYTOIN", "PHENOBARB", "VALPROATE", "CBMZ"   .res Assessment: Plan:    Plan:  PDMP reviewed  Abilify 2mg  daily Effexor XR 150 and 75mg   every morning Xanax 0.25mg  as needed - takes once a week Buspar 10mg  TID  Plans to reach out to [facility] for TMS information.  Continues to see therapist - Elisha Ponder.  RTC 8 weeks  25 minutes spent dedicated to the care of this patient on the date of this encounter to include pre-visit review of records, ordering of medication, post visit documentation, and face-to-face time with the patient discussing MDD, GAD, insomnia, panic attacks, and agoraphobia. Discussed continuing current medication regimen.  Patient advised to contact office with any questions, adverse effects, or acute worsening in signs and symptoms.  Discussed potential benefits, risk, and side effects of benzodiazepines to include potential risk of tolerance and dependence, as well as possible drowsiness.  Advised patient not to drive if experiencing drowsiness and to take lowest possible effective dose to minimize risk of dependence and tolerance.   Diagnoses and all orders for this visit:  Major depressive disorder, recurrent episode, moderate (HCC)  GAD (generalized anxiety disorder)  Panic  attacks  Agoraphobia  Insomnia, unspecified type    Please see After Visit Summary for patient specific instructions.  Future Appointments  Date Time Provider Department Center  06/14/2024 12:45 PM [facility] None  06/14/2024  1:00 PM Erenest Blank, NP [facility] None    No orders of the defined types were placed in this encounter.     -------------------------------

## 2023-07-19 ENCOUNTER — Telehealth: Payer: Self-pay | Admitting: Adult Health

## 2023-07-19 NOTE — Telephone Encounter (Signed)
 Pt called at 10:09a.  She said the pharmacy does not show her having a refill on the Abilify.  Pls call to find out the issue.   [id number] - [place], [place] - [address] AT [facility] [address] [facility] [address] [place], [place] [address] Phone: [phone]  Fax: [phone]   No upcoming appt scheduled.

## 2023-07-19 NOTE — Telephone Encounter (Signed)
 Patient does have RF available. Talked to the pharmacist and they will fill today for a cost of $10. Patient notified.

## 2023-08-29 ENCOUNTER — Ambulatory Visit: Admitting: Family

## 2023-08-29 ENCOUNTER — Encounter: Payer: Self-pay | Admitting: Family

## 2023-08-29 VITALS — BP 140/86 | HR 76 | Temp 98.4°F | Ht 64.5 in | Wt 248.2 lb

## 2023-08-29 DIAGNOSIS — R600 Localized edema: Secondary | ICD-10-CM

## 2023-08-29 DIAGNOSIS — E559 Vitamin D deficiency, unspecified: Secondary | ICD-10-CM

## 2023-08-29 DIAGNOSIS — R7303 Prediabetes: Secondary | ICD-10-CM

## 2023-08-29 DIAGNOSIS — Z8739 Personal history of other diseases of the musculoskeletal system and connective tissue: Secondary | ICD-10-CM | POA: Insufficient documentation

## 2023-08-29 DIAGNOSIS — R635 Abnormal weight gain: Secondary | ICD-10-CM | POA: Insufficient documentation

## 2023-08-29 DIAGNOSIS — E782 Mixed hyperlipidemia: Secondary | ICD-10-CM

## 2023-08-29 DIAGNOSIS — I10 Essential (primary) hypertension: Secondary | ICD-10-CM | POA: Diagnosis not present

## 2023-08-29 MED ORDER — HYDROCHLOROTHIAZIDE 25 MG PO TABS: ORAL_TABLET | ORAL | 0 refills | Status: DC

## 2023-08-29 NOTE — Assessment & Plan Note (Signed)
 Ordered lipid panel, pending results. Work on low cholesterol diet and exercise as tolerated

## 2023-08-29 NOTE — Assessment & Plan Note (Signed)
 Pt advised of the following: Work on a diabetic diet, try to incorporate exercise at least 20-30 a day for 3 days a week or more.

## 2023-08-29 NOTE — Progress Notes (Signed)
 Established Patient Office Visit  Subjective:   [name], female    DOB: May 23, 1970  Age: 53 y.o. MRN: [id number]  CC:  Chief Complaint  Patient presents with   Acute Visit    Swelling in feet and ankles    HPI: Patricia Bauer is a 53 y.o. female presenting on 08/29/2023 for Acute Visit (Swelling in feet and ankles)  Has noticed bil ankle  and upper foot swelling which she noticed about two weeks ago. She states that when they swell they look like vienna sausages. She states mainly after the morning. She doesn't eat a lot of salt in her diet. She is not wearing compression socks.  She does elevate which helps some. She has been moving more, standing more than usual.  She denies sob or DOE out of the ordinary. She has gained weight since feb 2025 but does state she has been eating more, however she has tried to switch to healther snacks and to eat more often. This has been a daily thing that occurs over the last few weeks. She has been eating more on the go with moving was well, more than usual.   HTN: yesterday BP was 138/83 she states she will feel when it spikes because she can feel th pressure in her head. This typically is related to stress and occurs 1-2 times a week.   Vitamin D  def: typically takes however has been out for a few weeks now.   Wt Readings from Last 3 Encounters:  08/29/23 248 lb 3.2 oz (112.6 kg)  06/16/23 235 lb (106.6 kg)  05/22/23 232 lb (105.2 kg)   Lab Results  Component Value Date   HGBA1C 6.2 (H) 02/17/2023    She is on amlodipine  10 mg once daily.   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   Relevant past medical history reviewed and updated as indicated.   Allergies and medications reviewed and updated.   Current Outpatient Medications:    ALPRAZolam   (XANAX ) 0.25 MG tablet, TAKE 1 TABLET(0.25 MG) BY MOUTH DAILY AS NEEDED FOR ANXIETY OR SLEEP, Disp: 30 tablet, Rfl: 2   amLODipine  (NORVASC ) 10 MG tablet, TAKE 1 TABLET(10 MG) BY MOUTH DAILY, Disp: 90 tablet, Rfl: 3   ARIPiprazole  (ABILIFY ) 2 MG tablet, Take 1 tablet (2 mg total) by mouth daily., Disp: 30 tablet, Rfl: 2   atorvastatin  (LIPITOR) 10 MG tablet, Take 1 tablet (10 mg total) by mouth daily., Disp: 90 tablet, Rfl: 3   busPIRone  (BUSPAR ) 10 MG tablet, Take 1 tablet (10 mg total) by mouth 3 (three) times daily., Disp: 90 tablet, Rfl: 5   cyclobenzaprine  (FLEXERIL ) 5 MG tablet, Take ONE tab PO BID/TID PRN, Disp: , Rfl:    famotidine  (PEPCID ) 20 MG tablet, TAKE 1 TABLET BY MOUTH AFTER SUPPER, Disp: 30 tablet, Rfl: 11   gabapentin  (NEURONTIN ) 300 MG capsule, Take 1 capsule (300 mg total) by mouth 2 (two) times daily., Disp: 60 capsule, Rfl: 2   hydrochlorothiazide (HYDRODIURIL) 25 MG tablet, Take one po every day prn swelling in legs, Disp: 30 tablet, Rfl: 0   Semaglutide -Weight Management (WEGOVY ) 0.5 MG/0.5ML SOAJ, Inject 0.5 mg into the skin once a week., Disp: 2 mL, Rfl: 0   SUMAtriptan  (IMITREX ) 50 MG tablet, TAKE 1 TABLET MAY  REPEAT IN 2 HOURS IF HEADACHE PERSISTS OR RECURS. DON'T USE MORE THAN TWO IN 24HRS, Disp: 10 tablet, Rfl: 2   traMADol  (ULTRAM ) 50 MG tablet, TAKE 1 TABLET(50 MG) BY MOUTH EVERY 4 HOURS FOR UP TO 5 DAYS AS NEEDED FOR COUGH OR PAIN, Disp: 30 tablet, Rfl: 0   venlafaxine  XR (EFFEXOR  XR) 150 MG 24 hr capsule, Take 1 capsule (150 mg total) by mouth daily with breakfast., Disp: 30 capsule, Rfl: 5   venlafaxine  XR (EFFEXOR  XR) 75 MG 24 hr capsule, Take 1 capsule (75 mg total) by mouth daily with breakfast., Disp: 30 capsule, Rfl: 5   Vitamin D -Vitamin K (VITAMIN K2-VITAMIN D3 PO), Take by mouth., Disp: , Rfl:   Allergies  Allergen Reactions   Citalopram  Other (See Comments)   Ibuprofen  Swelling    Bilateral swelling of ankles and feet   Diflucan [Fluconazole] Other  (See Comments)    Pelvic pain   Onion Other (See Comments)    migraines   Topamax [Topiramate] Other (See Comments)    Reaction:  Body aches     Omeprazole Other (See Comments)    Abdominal pain    Hydrocodone Anxiety, Palpitations and Other (See Comments)    Reaction:  Migraines     Objective:   BP (!) 140/86 (BP Location: Left Arm, Patient Position: Sitting, Cuff Size: Large)   Pulse 76   Temp 98.4 F (36.9 C) (Temporal)   Ht 5' 4.5" (1.638 m)   Wt 248 lb 3.2 oz (112.6 kg)   LMP 09/07/2009   SpO2 96%   BMI 41.95 kg/m    Physical Exam Vitals reviewed.  Constitutional:      General: She is not in acute distress.    Appearance: Normal appearance. She is normal weight. She is not ill-appearing, toxic-appearing or diaphoretic.  HENT:     Head: Normocephalic.  Cardiovascular:     Rate and Rhythm: Normal rate and regular rhythm.     Comments: No pitting edema Pulmonary:     Effort: Pulmonary effort is normal.     Breath sounds: Normal breath sounds.  Musculoskeletal:        General: Normal range of motion.     Right lower leg: 1+ Edema present.     Left lower leg: No edema.  Neurological:     General: No focal deficit present.     Mental Status: She is alert and oriented to person, place, and time. Mental status is at baseline.  Psychiatric:        Mood and Affect: Mood normal.        Behavior: Behavior normal.        Thought Content: Thought content normal.        Judgment: Judgment normal.     Assessment & Plan:  Pedal edema Assessment & Plan: Reassured I suspect this is more situational ( on feet more and eating out more than in home right now which likely will have more salt and butter). Did advise her to wear daily compression stockings, watch salt in diet, and elevated legs as necessary. RX sent for hydrochlorothiazide 25 mg once daily prn pedal edema. Could consider if no improvement decreasing amlodipine  to see if this is causative. Non pitting, reassuring  physical exam for neg for vascular insufficiency.   Pt was worried this may be CHF however provided reassurance this is not likely as pt without DOE/Sob and no pitting edema.   Orders: -     hydroCHLOROthiazide; Take one  po every day prn swelling in legs  Dispense: 30 tablet; Refill: 0 -     Basic metabolic panel with GFR -     T4, free -     TSH  Weight gain Assessment & Plan: Suspect from dietary reasons however we will also assess thyroid  panel as free t4 was slightly abn last lab draw.   Orders: -     Basic metabolic panel with GFR -     T3, free -     T4, free -     TSH  Primary hypertension  Mixed hyperlipidemia Assessment & Plan: Ordered lipid panel, pending results. Work on low cholesterol diet and exercise as tolerated   Orders: -     Lipid panel  Prediabetes Assessment & Plan: Pt advised of the following: Work on a diabetic diet, try to incorporate exercise at least 20-30 a day for 3 days a week or more.    Orders: -     Hemoglobin A1c  Vitamin D  deficiency -     VITAMIN D  25 Hydroxy (Vit-D Deficiency, Fractures)  History of rheumatoid arthritis     Follow up plan: Return in about 6 months (around 02/29/2024), or if symptoms worsen or fail to improve, for f/u CPE.  Felicita Horns, FNP

## 2023-08-29 NOTE — Assessment & Plan Note (Addendum)
 Reassured I suspect this is more situational ( on feet more and eating out more than in home right now which likely will have more salt and butter). Did advise her to wear daily compression stockings, watch salt in diet, and elevated legs as necessary. RX sent for hydrochlorothiazide 25 mg once daily prn pedal edema. Could consider if no improvement decreasing amlodipine  to see if this is causative. Non pitting, reassuring physical exam for neg for vascular insufficiency.   Pt was worried this may be CHF however provided reassurance this is not likely as pt without DOE/Sob and no pitting edema.

## 2023-08-29 NOTE — Assessment & Plan Note (Signed)
 Suspect from dietary reasons however we will also assess thyroid  panel as free t4 was slightly abn last lab draw.

## 2023-08-31 LAB — VITAMIN D 25 HYDROXY (VIT D DEFICIENCY, FRACTURES): Vit D, 25-Hydroxy: 32.4 ng/mL (ref 30.0–100.0)

## 2023-08-31 LAB — LIPID PANEL
Chol/HDL Ratio: 3.7 ratio (ref 0.0–4.4)
Cholesterol, Total: 198 mg/dL (ref 100–199)
HDL: 54 mg/dL (ref >39)
LDL Chol Calc (NIH): 105 mg/dL — ABNORMAL HIGH (ref 0–99)
Triglycerides: 228 mg/dL — ABNORMAL HIGH (ref 0–149)
VLDL Cholesterol Cal: 39 mg/dL (ref 5–40)

## 2023-08-31 LAB — BASIC METABOLIC PANEL WITH GFR
BUN/Creatinine Ratio: 18 (ref 9–23)
BUN: 16 mg/dL (ref 6–24)
CO2: 21 mmol/L (ref 20–29)
Calcium: 9.3 mg/dL (ref 8.7–10.2)
Chloride: 100 mmol/L (ref 96–106)
Creatinine, Ser: 0.88 mg/dL (ref 0.57–1.00)
Glucose: 130 mg/dL — ABNORMAL HIGH (ref 70–99)
Potassium: 4.6 mmol/L (ref 3.5–5.2)
Sodium: 139 mmol/L (ref 134–144)
eGFR: 79 mL/min/{1.73_m2} (ref >59)

## 2023-08-31 LAB — T4, FREE: Free T4: 0.74 ng/dL — ABNORMAL LOW (ref 0.82–1.77)

## 2023-08-31 LAB — T3, FREE: T3, Free: 3.2 pg/mL (ref 2.0–4.4)

## 2023-08-31 LAB — HEMOGLOBIN A1C
Est. average glucose Bld gHb Est-mCnc: 140 mg/dL
Hgb A1c MFr Bld: 6.5 % — ABNORMAL HIGH (ref 4.8–5.6)

## 2023-08-31 LAB — TSH: TSH: 2.53 u[IU]/mL (ref 0.450–4.500)

## 2023-09-02 ENCOUNTER — Ambulatory Visit: Payer: Self-pay | Admitting: Family

## 2023-09-02 DIAGNOSIS — E119 Type 2 diabetes mellitus without complications: Secondary | ICD-10-CM

## 2023-09-02 DIAGNOSIS — R7989 Other specified abnormal findings of blood chemistry: Secondary | ICD-10-CM

## 2023-09-02 DIAGNOSIS — E782 Mixed hyperlipidemia: Secondary | ICD-10-CM

## 2023-09-02 DIAGNOSIS — E6609 Other obesity due to excess calories: Secondary | ICD-10-CM

## 2023-09-02 DIAGNOSIS — I10 Essential (primary) hypertension: Secondary | ICD-10-CM

## 2023-09-06 DIAGNOSIS — E119 Type 2 diabetes mellitus without complications: Secondary | ICD-10-CM | POA: Insufficient documentation

## 2023-09-06 MED ORDER — TIRZEPATIDE 2.5 MG/0.5ML ~~LOC~~ SOAJ: 2.5000 mg | SUBCUTANEOUS | 2 refills | Status: DC

## 2023-09-28 ENCOUNTER — Encounter: Payer: Self-pay | Admitting: Family

## 2023-09-28 DIAGNOSIS — E119 Type 2 diabetes mellitus without complications: Secondary | ICD-10-CM

## 2023-09-28 DIAGNOSIS — I10 Essential (primary) hypertension: Secondary | ICD-10-CM

## 2023-09-28 DIAGNOSIS — E66811 Obesity, class 1: Secondary | ICD-10-CM

## 2023-09-28 DIAGNOSIS — E782 Mixed hyperlipidemia: Secondary | ICD-10-CM

## 2023-09-29 MED ORDER — TIRZEPATIDE 5 MG/0.5ML ~~LOC~~ SOAJ: 5.0000 mg | SUBCUTANEOUS | 1 refills | Status: DC

## 2023-10-18 ENCOUNTER — Other Ambulatory Visit: Payer: Self-pay

## 2023-10-18 ENCOUNTER — Encounter: Payer: Self-pay | Admitting: Family

## 2023-10-18 DIAGNOSIS — I10 Essential (primary) hypertension: Secondary | ICD-10-CM

## 2023-10-18 MED ORDER — AMLODIPINE BESYLATE 10 MG PO TABS: ORAL_TABLET | ORAL | 1 refills | Status: DC

## 2023-10-23 ENCOUNTER — Telehealth: Payer: Self-pay | Admitting: Adult Health

## 2023-10-23 DIAGNOSIS — F411 Generalized anxiety disorder: Secondary | ICD-10-CM

## 2023-10-23 DIAGNOSIS — F422 Mixed obsessional thoughts and acts: Secondary | ICD-10-CM

## 2023-10-23 DIAGNOSIS — F4000 Agoraphobia, unspecified: Secondary | ICD-10-CM

## 2023-10-23 DIAGNOSIS — F331 Major depressive disorder, recurrent, moderate: Secondary | ICD-10-CM

## 2023-10-23 MED ORDER — VENLAFAXINE HCL ER 150 MG PO CP24
150.0000 mg | ORAL_CAPSULE | Freq: Every day | ORAL | 0 refills | Status: DC
Start: 2023-10-23 — End: 2023-11-09

## 2023-10-23 MED ORDER — VENLAFAXINE HCL ER 75 MG PO CP24: 75.0000 mg | ORAL_CAPSULE | Freq: Every day | ORAL | 0 refills | Status: DC

## 2023-10-23 NOTE — Telephone Encounter (Signed)
 PT called asking for a refill on her effexor  xr 150 mg and 75 mg. She has an a ppt for 11/09/23. Pharmacy is [facility] on [address] in [place]

## 2023-10-23 NOTE — Telephone Encounter (Signed)
 Both venlafaxine  doses sent.

## 2023-11-02 ENCOUNTER — Encounter: Payer: Self-pay | Admitting: Family

## 2023-11-05 ENCOUNTER — Other Ambulatory Visit: Payer: Self-pay

## 2023-11-05 DIAGNOSIS — F331 Major depressive disorder, recurrent, moderate: Secondary | ICD-10-CM

## 2023-11-05 MED ORDER — ARIPIPRAZOLE 2 MG PO TABS: 2.0000 mg | ORAL_TABLET | Freq: Every day | ORAL | 0 refills | Status: DC

## 2023-11-09 ENCOUNTER — Encounter: Payer: Self-pay | Admitting: Adult Health

## 2023-11-09 ENCOUNTER — Telehealth (INDEPENDENT_AMBULATORY_CARE_PROVIDER_SITE_OTHER): Admitting: Adult Health

## 2023-11-09 DIAGNOSIS — F4000 Agoraphobia, unspecified: Secondary | ICD-10-CM | POA: Diagnosis not present

## 2023-11-09 DIAGNOSIS — F410 Panic disorder [episodic paroxysmal anxiety] without agoraphobia: Secondary | ICD-10-CM

## 2023-11-09 DIAGNOSIS — F331 Major depressive disorder, recurrent, moderate: Secondary | ICD-10-CM

## 2023-11-09 DIAGNOSIS — F411 Generalized anxiety disorder: Secondary | ICD-10-CM | POA: Diagnosis not present

## 2023-11-09 DIAGNOSIS — F339 Major depressive disorder, recurrent, unspecified: Secondary | ICD-10-CM

## 2023-11-09 DIAGNOSIS — G4700 Insomnia, unspecified: Secondary | ICD-10-CM

## 2023-11-09 MED ORDER — ARIPIPRAZOLE 2 MG PO TABS: 2.0000 mg | ORAL_TABLET | Freq: Every day | ORAL | 1 refills | Status: DC

## 2023-11-09 MED ORDER — BUSPIRONE HCL 10 MG PO TABS: 10.0000 mg | ORAL_TABLET | Freq: Three times a day (TID) | ORAL | 1 refills | Status: DC

## 2023-11-09 MED ORDER — ALPRAZOLAM 0.25 MG PO TABS: ORAL_TABLET | ORAL | 2 refills | Status: DC

## 2023-11-09 MED ORDER — VENLAFAXINE HCL ER 75 MG PO CP24: 75.0000 mg | ORAL_CAPSULE | Freq: Every day | ORAL | 1 refills | Status: DC

## 2023-11-09 MED ORDER — VENLAFAXINE HCL ER 150 MG PO CP24: 150.0000 mg | ORAL_CAPSULE | Freq: Every day | ORAL | 1 refills | Status: DC

## 2023-11-09 NOTE — Progress Notes (Signed)
 Patricia Bauer [id number] 1970/06/12 53 y.o.  Virtual Visit via Video Note  I connected with pt @ on 11/09/23 at  1:00 PM EDT by a video enabled telemedicine application and verified that I am speaking with the correct person using two identifiers.   I discussed the limitations of evaluation and management by telemedicine and the availability of in person appointments. The patient expressed understanding and agreed to proceed.  I discussed the assessment and treatment plan with the patient. The patient was provided an opportunity to ask questions and all were answered. The patient agreed with the plan and demonstrated an understanding of the instructions.   The patient was advised to call back or seek an in-person evaluation if the symptoms worsen or if the condition fails to improve as anticipated.  I provided 25 minutes of non-face-to-face time during this encounter.  The patient was located at home.  The provider was located at [facility].   [name], NP   Subjective:   Patient ID:  Patricia Bauer is a 53 y.o. (DOB Mar 25, 1971) female.  Chief Complaint: No chief complaint on file.   HPI BRITTANY OSIER presents for follow-up of MDD, GAD, insomnia, panic attacks, and agoraphobia.   Describes mood today as ok. Pleasant. Denies tearfulness. Mood symptoms - reports depressive episodes - not as bad as they used to be - getting out of them quicker. Reports improved interest and lacks motivation - they are not lining up. Reports increased anxiety this past week, but not in general. Denies irritability. Reports a few panic attacks - her and there. Reports decreased worry and rumination - not as much. Reports over thinking - going over worst case scenarios in my head. Reports obsessive thoughts and acts - reports skin picking. Reporting chronic pain issues - fibromyalgia. Reports she has her house on the market. Stating overall, I feel like I'm doing a lot  better, but not where I want to be.  Taking medications as prescribed.  Energy levels improved. Active, does not have a regular exercise routine with physical disabilities. Enjoys some usual interests and activities. Single. Divorced. Lives alone - daughter and grandchildren have moved out. She is now trying to sell her house. Mother in memory care unit. Spending time with family and friends. Appetite adequate. Weight loss. Started on Monjauro 2 months ago.   Reports sleeps has improved. Averages 7 to 8 hours - diagnosed with mild sleep apnea - unable to afford CPAP machine. Focus and concentration improved - a lot better since adding Abilify .  Completing tasks. Managing aspects of household. Works full time for [employer] - remote - day shift. Denies SI or HI.  Denies AH or VH. Denies self harm - skin picking. Denies substance use.  Therapist - Donzell Rend - x 2 years.  Previous medication trials:  Celexa , Zoloft - flat, Wellbutrin   Review of Systems:  Review of Systems  Musculoskeletal:  Negative for gait problem.  Neurological:  Negative for tremors.  Psychiatric/Behavioral:         Please refer to HPI    Medications: I have reviewed the patient's current medications.  Current Outpatient Medications  Medication Sig Dispense Refill   ALPRAZolam  (XANAX ) 0.25 MG tablet TAKE 1 TABLET(0.25 MG) BY MOUTH DAILY AS NEEDED FOR ANXIETY OR SLEEP 30 tablet 2   amLODipine  (NORVASC ) 10 MG tablet TAKE 1 TABLET(10 MG) BY MOUTH DAILY 90 tablet 1   ARIPiprazole  (ABILIFY ) 2 MG tablet Take 1 tablet (2 mg  total) by mouth daily. 90 tablet 0   atorvastatin  (LIPITOR) 10 MG tablet Take 1 tablet (10 mg total) by mouth daily. 90 tablet 3   busPIRone  (BUSPAR ) 10 MG tablet Take 1 tablet (10 mg total) by mouth 3 (three) times daily. 90 tablet 5   cyclobenzaprine  (FLEXERIL ) 5 MG tablet Take ONE tab PO BID/TID PRN     famotidine  (PEPCID ) 20 MG tablet TAKE 1 TABLET BY MOUTH AFTER SUPPER 30 tablet 11    gabapentin  (NEURONTIN ) 300 MG capsule Take 1 capsule (300 mg total) by mouth 2 (two) times daily. 60 capsule 2   hydrochlorothiazide  (HYDRODIURIL ) 25 MG tablet Take one po every day prn swelling in legs 30 tablet 0   SUMAtriptan  (IMITREX ) 50 MG tablet TAKE 1 TABLET MAY REPEAT IN 2 HOURS IF HEADACHE PERSISTS OR RECURS. DON'T USE MORE THAN TWO IN 24HRS 10 tablet 2   tirzepatide  (MOUNJARO ) 5 MG/0.5ML Pen Inject 5 mg into the skin once a week. 6 mL 1   traMADol  (ULTRAM ) 50 MG tablet TAKE 1 TABLET(50 MG) BY MOUTH EVERY 4 HOURS FOR UP TO 5 DAYS AS NEEDED FOR COUGH OR PAIN 30 tablet 0   venlafaxine  XR (EFFEXOR  XR) 150 MG 24 hr capsule Take 1 capsule (150 mg total) by mouth daily with breakfast. 30 capsule 0   venlafaxine  XR (EFFEXOR  XR) 75 MG 24 hr capsule Take 1 capsule (75 mg total) by mouth daily with breakfast. 30 capsule 0   Vitamin D -Vitamin K (VITAMIN K2-VITAMIN D3 PO) Take by mouth.     No current facility-administered medications for this visit.    Medication Side Effects: None  Allergies:  Allergies  Allergen Reactions   Citalopram  Other (See Comments)   Ibuprofen  Swelling    Bilateral swelling of ankles and feet   Diflucan [Fluconazole] Other (See Comments)    Pelvic pain   Onion Other (See Comments)    migraines   Topamax [Topiramate] Other (See Comments)    Reaction:  Body aches     Omeprazole Other (See Comments)    Abdominal pain    Hydrocodone Anxiety, Palpitations and Other (See Comments)    Reaction:  Migraines     Past Medical History:  Diagnosis Date   Anemia    Anxiety    Colon polyp    Depression    GERD (gastroesophageal reflux disease)    Headache    migraines    History of hysterectomy    still has both ovaries and cervix   History of migraine headaches    History of ovarian cyst    Hypertension    PONV (postoperative nausea and vomiting)     Family History  Problem Relation Age of Onset   Stroke Father    Heart attack Father    Hyperlipidemia  Mother    Hypertension Mother    Breast cancer Maternal Aunt 66   Colon cancer Neg Hx    Colon polyps Neg Hx    Kidney disease Neg Hx    Gallbladder disease Neg Hx    Esophageal cancer Neg Hx    Alcohol abuse Neg Hx    Drug abuse Neg Hx    Depression Neg Hx    Bipolar disorder Neg Hx    Anxiety disorder Neg Hx    Schizophrenia Neg Hx    Suicidality Neg Hx     Social History   Socioeconomic History   Marital status: Single    Spouse name: Not on file   Number  of children: 1   Years of education: Not on file   Highest education level: Some college, no degree  Occupational History   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[employer]  Tobacco Use   Smoking status: Never   Smokeless tobacco: Never  Vaping Use   Vaping status: Never Used  Substance and Sexual Activity   Alcohol use: Yes    Comment: occassional- once a month has about 2-3 drinks   Drug use: No   Sexual activity: Not Currently  Other Topics Concern   Not on file  Social History Narrative   Not on file   Social Drivers of Health   Financial Resource Strain: Low Risk  (05/21/2023)   Overall Financial Resource Strain (CARDIA)    Difficulty of Paying Living Expenses: Not very hard  Food Insecurity: Low Risk  (06/14/2023)   Received from [facility]   Hunger Vital Sign    Within the past 12 months, you worried that your food would run out before you got money to buy more: Never true    Within the past 12 months, the food you bought just didn't last and you didn't have money to get more. : Never true  Recent Concern: Food Insecurity - Food Insecurity Present (05/21/2023)   Hunger Vital Sign    Worried About Running Out of Food in the Last Year: Sometimes true    Ran Out of Food in the Last Year: Sometimes true  Transportation Needs: No Transportation Needs (06/14/2023)   Received from [facility]    In the past 12 months, has lack of reliable transportation kept you from medical appointments, meetings,  work or from getting things needed for daily living? : No  Recent Concern: Transportation Needs - Unmet Transportation Needs (05/21/2023)   PRAPARE - Administrator, Civil Service (Medical): Yes    Lack of Transportation (Non-Medical): No  Physical Activity: Unknown (05/21/2023)   Exercise Vital Sign    Days of Exercise per Week: 0 days    Minutes of Exercise per Session: Not on file  Stress: Stress Concern Present (05/21/2023)   [employer] of Occupational Health - Occupational Stress Questionnaire    Feeling of Stress : To some extent  Social Connections: Moderately Isolated (05/21/2023)   Social Connection and Isolation Panel    Frequency of Communication with Friends and Family: Never    Frequency of Social Gatherings with Friends and Family: Once a week    Attends Religious Services: More than 4 times per year    Active Member of [employer] or Organizations: Yes    Attends Engineer, structural: More than 4 times per year    Marital Status: Divorced  Catering manager Violence: Not on file    Past Medical History, Surgical history, Social history, and Family history were reviewed and updated as appropriate.   Please see review of systems for further details on the patient's review from today.   Objective:   Physical Exam:  LMP 09/07/2009   Physical Exam Constitutional:      General: She is not in acute distress. Musculoskeletal:        General: No deformity.  Neurological:     Mental Status: She is alert and oriented to person, place, and time.     Coordination: Coordination normal.  Psychiatric:        Attention and Perception: Attention and perception normal. She does not perceive auditory or visual hallucinations.        Mood and  Affect: Mood normal. Mood is not anxious or depressed. Affect is not labile, blunt, angry or inappropriate.        Speech: Speech normal.        Behavior: Behavior normal.        Thought Content: Thought content normal. Thought  content is not paranoid or delusional. Thought content does not include homicidal or suicidal ideation. Thought content does not include homicidal or suicidal plan.        Cognition and Memory: Cognition and memory normal.        Judgment: Judgment normal.     Comments: Insight intact     Lab Review:     Component Value Date/Time   NA 139 08/29/2023 0822   NA 139 03/13/2013 2022   K 4.6 08/29/2023 0822   K 3.6 03/13/2013 2022   CL 100 08/29/2023 0822   CL 106 03/13/2013 2022   CO2 21 08/29/2023 0822   CO2 26 03/13/2013 2022   GLUCOSE 130 (H) 08/29/2023 0822   GLUCOSE 110 (H) 05/04/2020 0503   GLUCOSE 94 03/13/2013 2022   BUN 16 08/29/2023 0822   BUN 11 03/13/2013 2022   CREATININE 0.88 08/29/2023 0822   CREATININE 1.05 (H) 05/24/2019 1359   CREATININE 0.80 03/13/2013 2022   CALCIUM  9.3 08/29/2023 0822   CALCIUM  8.7 03/13/2013 2022   PROT 7.1 02/17/2023 0823   ALBUMIN 4.4 02/17/2023 0823   AST 19 02/17/2023 0823   AST 27 05/24/2019 1359   ALT 27 02/17/2023 0823   ALT 32 05/24/2019 1359   ALKPHOS 89 02/17/2023 0823   BILITOT 0.3 02/17/2023 0823   BILITOT 0.3 05/24/2019 1359   GFRNONAA >60 05/04/2020 0503   GFRNONAA >60 05/24/2019 1359   GFRNONAA >60 03/13/2013 2022   GFRAA 89 12/31/2019 1451   GFRAA >60 05/24/2019 1359   GFRAA >60 03/13/2013 2022       Component Value Date/Time   WBC 9.9 06/16/2023 1253   WBC 8.1 05/14/2021 1429   RBC 4.51 06/16/2023 1254   RBC 4.49 06/16/2023 1253   HGB 14.0 06/16/2023 1253   HGB 11.9 01/17/2019 1519   HGB 12.0 01/17/2019 1519   HCT 41.7 06/16/2023 1253   HCT 36.1 01/17/2019 1519   HCT 36.5 01/17/2019 1519   PLT 309 06/16/2023 1253   PLT 333 01/17/2019 1519   PLT 338 01/17/2019 1519   MCV 92.9 06/16/2023 1253   MCV 82 01/17/2019 1519   MCV 83 01/17/2019 1519   MCV 85 03/13/2013 2022   MCH 31.2 06/16/2023 1253   MCHC 33.6 06/16/2023 1253   RDW 12.5 06/16/2023 1253   RDW 14.1 01/17/2019 1519   RDW 14.2 01/17/2019 1519    RDW 13.4 03/13/2013 2022   LYMPHSABS 3.4 06/16/2023 1253   LYMPHSABS 2.8 01/17/2019 1519   LYMPHSABS 3.0 01/17/2019 1519   MONOABS 0.5 06/16/2023 1253   EOSABS 0.1 06/16/2023 1253   EOSABS 0.2 01/17/2019 1519   EOSABS 0.2 01/17/2019 1519   BASOSABS 0.1 06/16/2023 1253   BASOSABS 0.0 01/17/2019 1519   BASOSABS 0.0 01/17/2019 1519    No results found for: POCLITH, LITHIUM   No results found for: PHENYTOIN, PHENOBARB, VALPROATE, CBMZ   .res Assessment: Plan:    Plan:  PDMP reviewed  Abilify  2mg  daily Effexor  XR 150 and 75mg  every morning Xanax  0.25mg  as needed - takes once a week Buspar  10mg  TID  Plans to reach out to [facility] for TMS information.  Continues to see therapist - Donzell  Pardo.  RTC 8 weeks  25 minutes spent dedicated to the care of this patient on the date of this encounter to include pre-visit review of records, ordering of medication, post visit documentation, and face-to-face time with the patient discussing MDD, GAD, insomnia, panic attacks, and agoraphobia. Discussed continuing current medication regimen.  Patient advised to contact office with any questions, adverse effects, or acute worsening in signs and symptoms.  Discussed potential benefits, risk, and side effects of benzodiazepines to include potential risk of tolerance and dependence, as well as possible drowsiness.  Advised patient not to drive if experiencing drowsiness and to take lowest possible effective dose to minimize risk of dependence and tolerance.   There are no diagnoses linked to this encounter.   Please see After Visit Summary for patient specific instructions.  Future Appointments  Date Time Provider Department Center  11/09/2023  1:00 PM Hermena Swint, Angeline Mattocks, NP [facility] None  06/14/2024 12:45 PM [facility] None  06/14/2024  1:00 PM Franchot Lauraine HERO, NP [facility] None    No orders of the defined types were placed in this encounter.      -------------------------------

## 2023-11-25 ENCOUNTER — Other Ambulatory Visit: Payer: Self-pay | Admitting: Adult Health

## 2023-11-25 DIAGNOSIS — F331 Major depressive disorder, recurrent, moderate: Secondary | ICD-10-CM

## 2023-11-25 DIAGNOSIS — F4000 Agoraphobia, unspecified: Secondary | ICD-10-CM

## 2023-11-25 DIAGNOSIS — F411 Generalized anxiety disorder: Secondary | ICD-10-CM

## 2023-11-30 ENCOUNTER — Encounter: Payer: Self-pay | Admitting: Family

## 2023-11-30 DIAGNOSIS — E119 Type 2 diabetes mellitus without complications: Secondary | ICD-10-CM

## 2023-11-30 MED ORDER — TIRZEPATIDE 7.5 MG/0.5ML ~~LOC~~ SOAJ: 7.5000 mg | SUBCUTANEOUS | 2 refills | Status: DC

## 2023-12-04 ENCOUNTER — Other Ambulatory Visit: Payer: Self-pay | Admitting: *Deleted

## 2023-12-04 DIAGNOSIS — E782 Mixed hyperlipidemia: Secondary | ICD-10-CM

## 2023-12-04 MED ORDER — ATORVASTATIN CALCIUM 10 MG PO TABS: 10.0000 mg | ORAL_TABLET | Freq: Every day | ORAL | 0 refills | Status: AC

## 2024-01-09 ENCOUNTER — Encounter: Payer: Self-pay | Admitting: Adult Health

## 2024-01-09 ENCOUNTER — Telehealth: Admitting: Adult Health

## 2024-01-09 DIAGNOSIS — F411 Generalized anxiety disorder: Secondary | ICD-10-CM

## 2024-01-09 DIAGNOSIS — F331 Major depressive disorder, recurrent, moderate: Secondary | ICD-10-CM | POA: Diagnosis not present

## 2024-01-09 DIAGNOSIS — F4000 Agoraphobia, unspecified: Secondary | ICD-10-CM | POA: Diagnosis not present

## 2024-01-09 DIAGNOSIS — F410 Panic disorder [episodic paroxysmal anxiety] without agoraphobia: Secondary | ICD-10-CM | POA: Diagnosis not present

## 2024-01-09 DIAGNOSIS — G4700 Insomnia, unspecified: Secondary | ICD-10-CM

## 2024-01-09 NOTE — Progress Notes (Signed)
 SUMMERS Bauer [id number] 08-Jun-1970 53 y.o.  Virtual Visit via Video Note  I connected with pt @ on 01/09/24 at  3:00 PM EDT by a video enabled telemedicine application and verified that I am speaking with the correct person using two identifiers.   I discussed the limitations of evaluation and management by telemedicine and the availability of in person appointments. The patient expressed understanding and agreed to proceed.  I discussed the assessment and treatment plan with the patient. The patient was provided an opportunity to ask questions and all were answered. The patient agreed with the plan and demonstrated an understanding of the instructions.   The patient was advised to call back or seek an in-person evaluation if the symptoms worsen or if the condition fails to improve as anticipated.  I provided 25 minutes of non-face-to-face time during this encounter.  The patient was located at home.  The provider was located at [facility].   Patricia LOISE Sayers, NP   Subjective:   Patient ID:  Patricia Bauer is a 53 y.o. (DOB [birth year]) female.  Chief Complaint: No chief complaint on file.   HPI ARMIYAH Bauer presents for follow-up of MDD, GAD, insomnia, panic attacks, and agoraphobia.   Describes mood today as ok. Pleasant. Denies tearfulness. Mood symptoms - reports decreased depressive episodes. Reports improved interest and motivation. Reports decreased anxiety. Denies recent panic attacks. Denies irritability.  Reports decreased worry and rumination - not as bad. Reports decreased over thinking - getting better. Reports obsessive thoughts and acts - reports decreased skin picking. Reporting chronic pain issues, but more manageable - fibromyalgia. Reports she has sold her house and moved into an apartment. Stating I feel like I'm doing ok. Taking medications as prescribed.  Energy levels improved. Active, does not have a regular exercise routine with  physical disabilities - has increased her walking. Enjoys some usual interests and activities. Single. Divorced. Lives alone - daughter and grandchildren have moved out. Mother in memory care unit. Spending time with family and friends. Appetite adequate. Weight loss. Started on Monjauro 3 months ago - lost more weight initially - it has now slowed down.   Reports sleeping better at night. Averages 7 to 8 hours - diagnosed with mild sleep apnea - unable to afford CPAP machine. Focus and concentration getting worse over the past few months. Reporting work and recent move contributing to symptoms. Completing tasks. Managing aspects of household. Works full time for [employer] - remote - day shift. Denies SI or HI.  Denies AH or VH. Denies self harm - decreased skin picking. Denies substance use.  Therapist - Patricia Bauer - x 2 years.  Previous medication trials:  Celexa , Zoloft - flat, Wellbutrin  Review of Systems:  Review of Systems  Musculoskeletal:  Negative for gait problem.  Neurological:  Negative for tremors.  Psychiatric/Behavioral:         Please refer to HPI    Medications: I have reviewed the patient's current medications.  Current Outpatient Medications  Medication Sig Dispense Refill   ALPRAZolam  (XANAX ) 0.25 MG tablet TAKE 1 TABLET(0.25 MG) BY MOUTH DAILY AS NEEDED FOR ANXIETY OR SLEEP 30 tablet 2   amLODipine  (NORVASC ) 10 MG tablet TAKE 1 TABLET(10 MG) BY MOUTH DAILY 90 tablet 1   ARIPiprazole  (ABILIFY ) 2 MG tablet Take 1 tablet (2 mg total) by mouth daily. 90 tablet 1   atorvastatin  (LIPITOR) 10 MG tablet Take 1 tablet (10 mg total) by mouth daily. 90 tablet 0  busPIRone  (BUSPAR ) 10 MG tablet Take 1 tablet (10 mg total) by mouth 3 (three) times daily. 270 tablet 1   cyclobenzaprine  (FLEXERIL ) 5 MG tablet Take ONE tab PO BID/TID PRN     famotidine  (PEPCID ) 20 MG tablet TAKE 1 TABLET BY MOUTH AFTER SUPPER 30 tablet 11   gabapentin  (NEURONTIN ) 300 MG capsule Take 1  capsule (300 mg total) by mouth 2 (two) times daily. 60 capsule 2   hydrochlorothiazide  (HYDRODIURIL ) 25 MG tablet Take one po every day prn swelling in legs 30 tablet 0   SUMAtriptan  (IMITREX ) 50 MG tablet TAKE 1 TABLET MAY REPEAT IN 2 HOURS IF HEADACHE PERSISTS OR RECURS. DON'T USE MORE THAN TWO IN 24HRS 10 tablet 2   tirzepatide  (MOUNJARO ) 7.5 MG/0.5ML Pen Inject 7.5 mg into the skin once a week. 6 mL 2   traMADol  (ULTRAM ) 50 MG tablet TAKE 1 TABLET(50 MG) BY MOUTH EVERY 4 HOURS FOR UP TO 5 DAYS AS NEEDED FOR COUGH OR PAIN 30 tablet 0   venlafaxine  XR (EFFEXOR  XR) 75 MG 24 hr capsule Take 1 capsule (75 mg total) by mouth daily with breakfast. 90 capsule 1   venlafaxine  XR (EFFEXOR -XR) 150 MG 24 hr capsule TAKE 1 CAPSULE(150 MG) BY MOUTH DAILY WITH BREAKFAST 30 capsule 0   Vitamin D -Vitamin K (VITAMIN K2-VITAMIN D3 PO) Take by mouth.     No current facility-administered medications for this visit.    Medication Side Effects: None  Allergies:  Allergies  Allergen Reactions   Citalopram  Other (See Comments)   Ibuprofen  Swelling    Bilateral swelling of ankles and feet   Diflucan [Fluconazole] Other (See Comments)    Pelvic pain   Onion Other (See Comments)    migraines   Topamax [Topiramate] Other (See Comments)    Reaction:  Body aches     Omeprazole Other (See Comments)    Abdominal pain    Hydrocodone Anxiety, Palpitations and Other (See Comments)    Reaction:  Migraines     Past Medical History:  Diagnosis Date   Anemia    Anxiety    Colon polyp    Depression    GERD (gastroesophageal reflux disease)    Headache    migraines    History of hysterectomy    still has both ovaries and cervix   History of migraine headaches    History of ovarian cyst    Hypertension    PONV (postoperative nausea and vomiting)     Family History  Problem Relation Age of Onset   Stroke Father    Heart attack Father    Hyperlipidemia Mother    Hypertension Mother    Breast cancer  Maternal Aunt 77   Colon cancer Neg Hx    Colon polyps Neg Hx    Kidney disease Neg Hx    Gallbladder disease Neg Hx    Esophageal cancer Neg Hx    Alcohol abuse Neg Hx    Drug abuse Neg Hx    Depression Neg Hx    Bipolar disorder Neg Hx    Anxiety disorder Neg Hx    Schizophrenia Neg Hx    Suicidality Neg Hx     Social History   Socioeconomic History   Marital status: Single    Spouse name: Not on file   Number of children: 1   Years of education: Not on file   Highest education level: Some college, no degree  Occupational History   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[employer]  Tobacco Use  Smoking status: Never   Smokeless tobacco: Never  Vaping Use   Vaping status: Never Used  Substance and Sexual Activity   Alcohol use: Yes    Comment: occassional- once a month has about 2-3 drinks   Drug use: No   Sexual activity: Not Currently  Other Topics Concern   Not on file  Social History Narrative   Not on file   Social Drivers of Health   Financial Resource Strain: Low Risk  (05/21/2023)   Overall Financial Resource Strain (CARDIA)    Difficulty of Paying Living Expenses: Not very hard  Food Insecurity: Low Risk  (06/14/2023)   Received from [facility]   Hunger Vital Sign    Within the past 12 months, you worried that your food would run out before you got money to buy more: Never true    Within the past 12 months, the food you bought just didn't last and you didn't have money to get more. : Never true  Recent Concern: Food Insecurity - Food Insecurity Present (05/21/2023)   Hunger Vital Sign    Worried About Running Out of Food in the Last Year: Sometimes true    Ran Out of Food in the Last Year: Sometimes true  Transportation Needs: No Transportation Needs (06/14/2023)   Received from [facility]    In the past 12 months, has lack of reliable transportation kept you from medical appointments, meetings, work or from getting things needed for daily  living? : No  Recent Concern: Transportation Needs - Unmet Transportation Needs (05/21/2023)   PRAPARE - Administrator, Civil Service (Medical): Yes    Lack of Transportation (Non-Medical): No  Physical Activity: Unknown (05/21/2023)   Exercise Vital Sign    Days of Exercise per Week: 0 days    Minutes of Exercise per Session: Not on file  Stress: Stress Concern Present (05/21/2023)   [employer] of Occupational Health - Occupational Stress Questionnaire    Feeling of Stress : To some extent  Social Connections: Moderately Isolated (05/21/2023)   Social Connection and Isolation Panel    Frequency of Communication with Friends and Family: Never    Frequency of Social Gatherings with Friends and Family: Once a week    Attends Religious Services: More than 4 times per year    Active Member of [employer] or Organizations: Yes    Attends Engineer, structural: More than 4 times per year    Marital Status: Divorced  Catering manager Violence: Not on file    Past Medical History, Surgical history, Social history, and Family history were reviewed and updated as appropriate.   Please see review of systems for further details on the patient's review from today.   Objective:   Physical Exam:  LMP 09/07/2009   Physical Exam Constitutional:      General: She is not in acute distress. Musculoskeletal:        General: No deformity.  Neurological:     Mental Status: She is alert and oriented to person, place, and time.     Coordination: Coordination normal.  Psychiatric:        Attention and Perception: Attention and perception normal. She does not perceive auditory or visual hallucinations.        Mood and Affect: Mood normal. Mood is not anxious or depressed. Affect is not labile, blunt, angry or inappropriate.        Speech: Speech normal.  Behavior: Behavior normal.        Thought Content: Thought content normal. Thought content is not paranoid or delusional.  Thought content does not include homicidal or suicidal ideation. Thought content does not include homicidal or suicidal plan.        Cognition and Memory: Cognition and memory normal.        Judgment: Judgment normal.     Comments: Insight intact     Lab Review:     Component Value Date/Time   NA 139 08/29/2023 0822   NA 139 03/13/2013 2022   K 4.6 08/29/2023 0822   K 3.6 03/13/2013 2022   CL 100 08/29/2023 0822   CL 106 03/13/2013 2022   CO2 21 08/29/2023 0822   CO2 26 03/13/2013 2022   GLUCOSE 130 (H) 08/29/2023 0822   GLUCOSE 110 (H) 05/04/2020 0503   GLUCOSE 94 03/13/2013 2022   BUN 16 08/29/2023 0822   BUN 11 03/13/2013 2022   CREATININE 0.88 08/29/2023 0822   CREATININE 1.05 (H) 05/24/2019 1359   CREATININE 0.80 03/13/2013 2022   CALCIUM  9.3 08/29/2023 0822   CALCIUM  8.7 03/13/2013 2022   PROT 7.1 02/17/2023 0823   ALBUMIN 4.4 02/17/2023 0823   AST 19 02/17/2023 0823   AST 27 05/24/2019 1359   ALT 27 02/17/2023 0823   ALT 32 05/24/2019 1359   ALKPHOS 89 02/17/2023 0823   BILITOT 0.3 02/17/2023 0823   BILITOT 0.3 05/24/2019 1359   GFRNONAA >60 05/04/2020 0503   GFRNONAA >60 05/24/2019 1359   GFRNONAA >60 03/13/2013 2022   GFRAA 89 12/31/2019 1451   GFRAA >60 05/24/2019 1359   GFRAA >60 03/13/2013 2022       Component Value Date/Time   WBC 9.9 06/16/2023 1253   WBC 8.1 05/14/2021 1429   RBC 4.51 06/16/2023 1254   RBC 4.49 06/16/2023 1253   HGB 14.0 06/16/2023 1253   HGB 11.9 01/17/2019 1519   HGB 12.0 01/17/2019 1519   HCT 41.7 06/16/2023 1253   HCT 36.1 01/17/2019 1519   HCT 36.5 01/17/2019 1519   PLT 309 06/16/2023 1253   PLT 333 01/17/2019 1519   PLT 338 01/17/2019 1519   MCV 92.9 06/16/2023 1253   MCV 82 01/17/2019 1519   MCV 83 01/17/2019 1519   MCV 85 03/13/2013 2022   MCH 31.2 06/16/2023 1253   MCHC 33.6 06/16/2023 1253   RDW 12.5 06/16/2023 1253   RDW 14.1 01/17/2019 1519   RDW 14.2 01/17/2019 1519   RDW 13.4 03/13/2013 2022    LYMPHSABS 3.4 06/16/2023 1253   LYMPHSABS 2.8 01/17/2019 1519   LYMPHSABS 3.0 01/17/2019 1519   MONOABS 0.5 06/16/2023 1253   EOSABS 0.1 06/16/2023 1253   EOSABS 0.2 01/17/2019 1519   EOSABS 0.2 01/17/2019 1519   BASOSABS 0.1 06/16/2023 1253   BASOSABS 0.0 01/17/2019 1519   BASOSABS 0.0 01/17/2019 1519    No results found for: POCLITH, LITHIUM   No results found for: PHENYTOIN, PHENOBARB, VALPROATE, CBMZ   .res Assessment: Plan:    Plan:  PDMP reviewed  Abilify  2mg  daily Effexor  XR 150 and 75mg  every morning Xanax  0.25mg  as needed - takes once a week Buspar  10mg  TID  Plans to reach out to [facility] - TMS information.  Continues to see therapist - Patricia Bauer.  RTC 8 weeks  25 minutes spent dedicated to the care of this patient on the date of this encounter to include pre-visit review of records, ordering of medication, post visit documentation,  and face-to-face time with the patient discussing MDD, GAD, insomnia, panic attacks, and agoraphobia. Discussed continuing current medication regimen.  Patient advised to contact office with any questions, adverse effects, or acute worsening in signs and symptoms.  Discussed potential benefits, risk, and side effects of benzodiazepines to include potential risk of tolerance and dependence, as well as possible drowsiness. Advised patient not to drive if experiencing drowsiness and to take lowest possible effective dose to minimize risk of dependence and tolerance.   There are no diagnoses linked to this encounter.   Please see After Visit Summary for patient specific instructions.  Future Appointments  Date Time Provider Department Center  01/09/2024  3:00 PM Luvern Mcisaac, Patricia Mattocks, NP [facility] None  02/21/2024  2:20 PM Thapa, [place], MD [facility] None  06/14/2024 12:45 PM [facility] None  06/14/2024  1:00 PM Franchot Lauraine HERO, NP [facility] None    No orders of the defined types were placed in this  encounter.     -------------------------------

## 2024-01-29 ENCOUNTER — Encounter: Payer: Self-pay | Admitting: Family

## 2024-02-04 ENCOUNTER — Other Ambulatory Visit: Payer: Self-pay | Admitting: Adult Health

## 2024-02-04 DIAGNOSIS — F331 Major depressive disorder, recurrent, moderate: Secondary | ICD-10-CM

## 2024-02-05 ENCOUNTER — Encounter: Payer: Self-pay | Admitting: Family

## 2024-02-05 ENCOUNTER — Ambulatory Visit: Payer: Self-pay | Admitting: Podiatry

## 2024-02-05 ENCOUNTER — Ambulatory Visit (INDEPENDENT_AMBULATORY_CARE_PROVIDER_SITE_OTHER)

## 2024-02-05 DIAGNOSIS — M79671 Pain in right foot: Secondary | ICD-10-CM | POA: Diagnosis not present

## 2024-02-05 DIAGNOSIS — M722 Plantar fascial fibromatosis: Secondary | ICD-10-CM

## 2024-02-05 DIAGNOSIS — M2011 Hallux valgus (acquired), right foot: Secondary | ICD-10-CM | POA: Diagnosis not present

## 2024-02-05 DIAGNOSIS — S93691D Other sprain of right foot, subsequent encounter: Secondary | ICD-10-CM | POA: Diagnosis not present

## 2024-02-05 DIAGNOSIS — F32A Depression, unspecified: Secondary | ICD-10-CM | POA: Insufficient documentation

## 2024-02-05 DIAGNOSIS — G8929 Other chronic pain: Secondary | ICD-10-CM

## 2024-02-05 NOTE — Progress Notes (Signed)
 She presents today for follow-up of her right foot.  She states that it hurts so bad all the time and has never gotten better and it is just making everything else hurt.  She states that is affecting my ability perform her daily activities and maintain my general good health.  She states that this plantar fasciitis is relentless I have tried injections orthotics steroids nonsteroidals exercise shoe gear changes and nothing seems to help.  She states that currently is hurting so bad that is causing other parts of my foot to hurt.  She is also concerned about her bunion deformity on the right foot that is exquisitely painful and wonders if this could be causing part of her problem.  Objective: Vital signs are stable alert and oriented x 3 pulses are palpable.  I reviewed her past medical history medications allergies surgeries and social history.  She has severe pain on palpation medial calcaneal tubercle of the right heel with a laxity in the medial band of the plantar fascia most likely a rupture or partial tear.  The margin is no longer fully palpable to the heel.  She also has severe flatfoot deformity with greater than a 18 degree first intermetatarsal angle and dislocation of the first metatarsal phalangeal joint laterally.  There is some osteoarthritic change with crepitation on range of motion.  Radiographs taken today do demonstrate soft tissue increase in density around the calcaneus possibly consistent with chronic fasciitis or tear.  Radiographs of the bunion deformity do demonstrate severe hallux valgus deformity with osteoarthritic change of the first metatarsal phalangeal joint and near dislocation of the joint.  Assessment: Tear of the plantar fascia of the right heel.  Osteoarthritis first metatarsal phalangeal joint.  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  Plan: Discussed etiology pathology conservative surgical therapies at this point we need to identify as to  whether or not the plantar fascia is torn.  If it is torn then this will have to be finished all for surgically corrected at the time of surgery for her bunion deformity.  She may not have her bunion deformity done if there is no tear of the plantar fascia.  I will follow-up with her once we get the MRI findings regarding this right foot.

## 2024-02-10 ENCOUNTER — Ambulatory Visit
Admission: RE | Admit: 2024-02-10 | Discharge: 2024-02-10 | Disposition: A | Payer: Self-pay | Source: Ambulatory Visit | Attending: Podiatry | Admitting: Podiatry

## 2024-02-10 DIAGNOSIS — M722 Plantar fascial fibromatosis: Secondary | ICD-10-CM

## 2024-02-10 DIAGNOSIS — S93691D Other sprain of right foot, subsequent encounter: Secondary | ICD-10-CM

## 2024-02-10 DIAGNOSIS — G8929 Other chronic pain: Secondary | ICD-10-CM

## 2024-02-14 ENCOUNTER — Ambulatory Visit: Payer: Self-pay | Admitting: Podiatry

## 2024-02-14 NOTE — Progress Notes (Signed)
 Left message to contact office no answer.

## 2024-02-21 ENCOUNTER — Ambulatory Visit: Payer: Self-pay | Admitting: Family

## 2024-02-21 ENCOUNTER — Encounter: Payer: Self-pay | Admitting: Endocrinology

## 2024-02-21 ENCOUNTER — Encounter: Payer: Self-pay | Admitting: Family

## 2024-02-21 ENCOUNTER — Ambulatory Visit: Payer: Self-pay | Admitting: Endocrinology

## 2024-02-21 VITALS — BP 98/70 | HR 90 | Resp 20 | Ht 64.5 in | Wt 232.0 lb

## 2024-02-21 VITALS — BP 130/80 | HR 82 | Temp 98.0°F | Ht 64.5 in | Wt 233.4 lb

## 2024-02-21 DIAGNOSIS — I10 Essential (primary) hypertension: Secondary | ICD-10-CM | POA: Diagnosis not present

## 2024-02-21 DIAGNOSIS — R7989 Other specified abnormal findings of blood chemistry: Secondary | ICD-10-CM | POA: Diagnosis not present

## 2024-02-21 DIAGNOSIS — Z0000 Encounter for general adult medical examination without abnormal findings: Secondary | ICD-10-CM

## 2024-02-21 DIAGNOSIS — E119 Type 2 diabetes mellitus without complications: Secondary | ICD-10-CM

## 2024-02-21 DIAGNOSIS — Z23 Encounter for immunization: Secondary | ICD-10-CM

## 2024-02-21 DIAGNOSIS — E559 Vitamin D deficiency, unspecified: Secondary | ICD-10-CM

## 2024-02-21 DIAGNOSIS — F332 Major depressive disorder, recurrent severe without psychotic features: Secondary | ICD-10-CM

## 2024-02-21 DIAGNOSIS — Z1231 Encounter for screening mammogram for malignant neoplasm of breast: Secondary | ICD-10-CM

## 2024-02-21 DIAGNOSIS — Z7985 Long-term (current) use of injectable non-insulin antidiabetic drugs: Secondary | ICD-10-CM

## 2024-02-21 DIAGNOSIS — Z1211 Encounter for screening for malignant neoplasm of colon: Secondary | ICD-10-CM

## 2024-02-21 DIAGNOSIS — E782 Mixed hyperlipidemia: Secondary | ICD-10-CM

## 2024-02-21 MED ORDER — TIRZEPATIDE 10 MG/0.5ML ~~LOC~~ SOAJ: 10.0000 mg | SUBCUTANEOUS | 1 refills | Status: AC

## 2024-02-21 NOTE — Progress Notes (Signed)
 Subjective:  [name], female    DOB: Apr 24, 1970  Age: 53 y.o. MRN: [id number]  Patient Care Team: Corwin Antu, FNP as PCP - General (Family Medicine)   CC:  Chief Complaint  Patient presents with   Annual Exam    HPI Patricia Bauer is a 53 y.o. female who presents today for an annual physical exam. She reports consuming a general diet. The patient does not participate in regular exercise at present. She is limited by foot pain, pending surgery. She generally feels well. She reports sleeping fairly well. She does not have additional problems to discuss today.   Vision:Within last year Dental:Receives regular dental care  Mammogram: 07/03/23 Last pap: 03/29/22 Colonoscopy:12/07/17 every five years  Pt is without acute concerns.   Discussed the use of AI scribe software for clinical note transcription with the patient, who gave verbal consent to proceed.  History of Present Illness Patricia Bauer is a 53 year old female who presents for an annual physical exam.  Her last A1c was 6.5%. She takes amlodipine  and atorvastatin . She does not regularly check her blood pressure at home but notes it is often elevated during visits. She has lost weight from 248 lbs to 233 lbs since May.  She manages fibromyalgia with tramadol  as needed during flare-ups. Her medication regimen includes buspirone , gabapentin , Medrol  7.5 mg, Abilify , and venlafaxine  225 mg. She has not needed sumatriptan  recently as her symptoms have been stable.  She is awaiting foot surgery for a bunion and a torn fascia, which limits her physical activity. She reports difficulty falling asleep but sleeps well once asleep.  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 She is up to date with her mammogram and dental exams but may be overdue for an eye exam. She is also due for a colonoscopy, which she plans to schedule in [place].  She has a history of a supracervical  hysterectomy and continues to have PAP smears every three to five years.    Wt Readings from Last 3 Encounters:  02/21/24 233 lb 6.4 oz (105.9 kg)  08/29/23 248 lb 3.2 oz (112.6 kg)  06/16/23 235 lb (106.6 kg)     Advanced Directives Patient does have advanced directives but we do not have a copy in our records.    DEPRESSION SCREENING    02/21/2024    8:20 AM 08/29/2023    8:35 AM 05/22/2023    9:44 AM 02/17/2023    7:44 AM 05/23/2022   12:09 PM 01/21/2022    8:51 AM 02/23/2021    3:33 PM  PHQ 2/9 Scores  PHQ - 2 Score 1 0 0 1 2 2 2   PHQ- 9 Score 6 9  11 12 10 15      ROS: Negative unless specifically indicated above in HPI.    Current Outpatient Medications:    ALPRAZolam  (XANAX ) 0.25 MG tablet, TAKE 1 TABLET(0.25 MG) BY MOUTH DAILY AS NEEDED FOR ANXIETY OR SLEEP, Disp: 30 tablet, Rfl: 2   amLODipine  (NORVASC ) 10 MG tablet, TAKE 1 TABLET(10 MG) BY MOUTH DAILY, Disp: 90 tablet, Rfl: 1   ARIPiprazole  (ABILIFY ) 2 MG tablet, TAKE 1 TABLET(2 MG) BY MOUTH DAILY, Disp: 90 tablet, Rfl: 0   atorvastatin  (LIPITOR) 10 MG tablet, Take 1 tablet (10 mg total) by mouth daily., Disp: 90 tablet, Rfl: 0   busPIRone  (BUSPAR ) 10 MG tablet, Take 1 tablet (10 mg total) by mouth 3 (three) times daily.,  Disp: 270 tablet, Rfl: 1   famotidine  (PEPCID ) 20 MG tablet, TAKE 1 TABLET BY MOUTH AFTER SUPPER, Disp: 30 tablet, Rfl: 11   gabapentin  (NEURONTIN ) 300 MG capsule, Take 1 capsule (300 mg total) by mouth 2 (two) times daily., Disp: 60 capsule, Rfl: 2   SUMAtriptan  (IMITREX ) 50 MG tablet, TAKE 1 TABLET MAY REPEAT IN 2 HOURS IF HEADACHE PERSISTS OR RECURS. DON'T USE MORE THAN TWO IN 24HRS, Disp: 10 tablet, Rfl: 2   tirzepatide  (MOUNJARO ) 10 MG/0.5ML Pen, Inject 10 mg into the skin once a week., Disp: 6 mL, Rfl: 1   traMADol  (ULTRAM ) 50 MG tablet, TAKE 1 TABLET(50 MG) BY MOUTH EVERY 4 HOURS FOR UP TO 5 DAYS AS NEEDED FOR COUGH OR PAIN, Disp: 30 tablet, Rfl: 0   venlafaxine  XR (EFFEXOR  XR) 75 MG 24 hr capsule,  Take 1 capsule (75 mg total) by mouth daily with breakfast., Disp: 90 capsule, Rfl: 1   venlafaxine  XR (EFFEXOR -XR) 150 MG 24 hr capsule, TAKE 1 CAPSULE(150 MG) BY MOUTH DAILY WITH BREAKFAST, Disp: 30 capsule, Rfl: 0    Objective:    BP 130/80 (BP Location: Left Arm, Patient Position: Sitting, Cuff Size: Large)   Pulse 82   Temp 98 F (36.7 C) (Temporal)   Ht 5' 4.5 (1.638 m)   Wt 233 lb 6.4 oz (105.9 kg)   LMP 09/07/2009   SpO2 100%   BMI 39.44 kg/m   BP Readings from Last 3 Encounters:  02/21/24 130/80  08/29/23 (!) 140/86  06/16/23 (!) 146/89      Physical Exam Vitals reviewed.  Constitutional:      General: She is not in acute distress.    Appearance: Normal appearance. She is obese. She is not ill-appearing.  HENT:     Head: Normocephalic.     Right Ear: Tympanic membrane normal.     Left Ear: Tympanic membrane normal.     Nose: Nose normal.     Mouth/Throat:     Mouth: Mucous membranes are moist.  Eyes:     Extraocular Movements: Extraocular movements intact.     Pupils: Pupils are equal, round, and reactive to light.  Cardiovascular:     Rate and Rhythm: Normal rate and regular rhythm.  Pulmonary:     Effort: Pulmonary effort is normal.     Breath sounds: Normal breath sounds.  Abdominal:     General: Abdomen is flat. Bowel sounds are normal.     Palpations: Abdomen is soft.     Tenderness: There is no guarding or rebound.  Musculoskeletal:        General: Normal range of motion.     Cervical back: Normal range of motion.  Skin:    General: Skin is warm.     Capillary Refill: Capillary refill takes less than 2 seconds.  Neurological:     General: No focal deficit present.     Mental Status: She is alert.  Psychiatric:        Mood and Affect: Mood normal.        Behavior: Behavior normal.        Thought Content: Thought content normal.        Judgment: Judgment normal.    Title   Diabetic Foot Exam - detailed Is there a history of foot ulcer?:  No Is there a foot ulcer now?: No Is there swelling?: No Is there elevated skin temperature?: No Is there abnormal foot shape?: No Is there a claw toe deformity?: No  Are the toenails long?: No Are the toenails thick?: No Are the toenails ingrown?: No Is the skin thin, fragile, shiny and hairless?: No Normal Range of Motion?: Yes Is there foot or ankle muscle weakness?: No Do you have pain in calf while walking?: No Are the shoes appropriate in style and fit?: Yes Can the patient see the bottom of their feet?: Yes Pulse Foot Exam completed.: Yes   Right Posterior Tibialis: Present Left posterior Tibialis: Present   Right Dorsalis Pedis: Present Left Dorsalis Pedis: Present     Sensory Foot Exam Completed.: Yes Semmes-Weinstein Monofilament Test + means has sensation and - means no sensation  R Foot Test Control: Pos L Foot Test Control: Pos   R Site 1-Great Toe: Pos (Comment: dimished) L Site 1-Great Toe: Pos   R Site 4: Pos L Site 4: Pos   R site 5: Pos L Site 5: Pos  R Site 6: Pos L Site 6: Pos     Image components are not supported.   Image components are not supported. Image components are not supported.  [place] Comments       Results LABS A1c: 6.5% Free T4: Low  RADIOLOGY Mammogram: Normal (07/03/2023)      Assessment & Plan:   Assessment and Plan Assessment & Plan Adult Wellness Visit Routine adult wellness visit with no acute issues. Mammogram in March was normal. Colonoscopy protocol updated to every ten years. Supracervical hysterectomy requires continued PAP smears. Eye exam overdue. Discussed pneumonia vaccine eligibility due to age and diabetes. - Ordered mammogram after March - Referred to GI specialist in [facility] for colonoscopy - Schedule PAP smear with gynecologist - Schedule eye exam -Patient Counseling(The following topics were reviewed):  Preventative care handout given to pt  Health maintenance and immunizations  reviewed. Please refer to Health maintenance section. Pt advised on safe sex, wearing seatbelts in car, and proper nutrition labwork ordered today for annual Dental health: Discussed importance of regular tooth brushing, flossing, and dental visits.   Essential hypertension Blood pressure slightly elevated in office at 130/80 mmHg, previously 140/86 mmHg. Stress and irritation may contribute to elevated readings. - Encouraged home blood pressure monitoring  Type 2 diabetes mellitus A1c at 6.5%. Weight decreased from 248 lbs in May to 233 lbs.  - Continue current diabetes management - increase mounjaro  to 10 mg weekly, pt tolerating 7.5 mg dose well   Mixed hyperlipidemia Currently managed with atorvastatin .  Weight management Weight decreased from 248 lbs in May to 233 lbs. Discussed potential increase in semaglutide  dose for further weight management.   Depression Managed with venlafaxine  and aripiprazole . following with psychiatry  General Health Maintenance Discussed importance of regular screenings and vaccinations. Pneumonia vaccine recommended due to age and diabetes. - Schedule eye exam          Follow-up: Return in about 6 months (around 08/20/2024) for f/u diabetes.   Ginger Patrick, FNP

## 2024-02-21 NOTE — Progress Notes (Unsigned)
 Outpatient Endocrinology Note Patricia Zacher, MD   Patient's Name: Patricia Bauer    DOB: 04-02-1971    MRN: [id number]  REASON OF VISIT: New consult for abnormal thyroid  function test  REFERRING PROVIDER: Corwin Antu, FNP  PCP: Corwin Antu, FNP  HISTORY OF PRESENT ILLNESS:   Patricia Bauer is a 53 y.o. old female with past medical history as listed below is presented for new consult for abnormal thyroid  function test.   Pertinent Thyroid  History: Patient is referred to endocrinology for evaluation and management of low free T4.  Initial consult on February 21, 2024.  Patient had routine thyroid  function test, noted to have mildly low free T4,, checked twice in November 2024 and May 2025.  She persistently has normal TSH for several years.  She had normal free T3.  Patient has complaints of tiredness for several years, lately getting hot flashes.  She denies change in bowel habit with constipation or diarrhea.  Body weight has been relatively stable.  No headache or peripheral vision loss.  She has no pituitary surgery or any known pituitary disorder.  She used to have regular menstrual cycles, did not have problem getting pregnant, she has one child.  She had normal growth and development.  No family history of thyroid  disorder.  She has no other known thyroid  disorder.  She is not on thyroid  medication.  She denies taking over-the-counter supplement including no biotin.  She does not take multivitamin.   Latest Reference Range & Units 05/23/22 12:34 02/17/23 08:23 08/29/23 08:22  TSH 0.450 - 4.500 uIU/mL 2.110 1.960 2.530  Triiodothyronine,Free,Serum 2.0 - 4.4 pg/mL   3.2  T4,Free(Direct) 0.82 - 1.77 ng/dL  9.18 (L) 9.25 (L)  (L): Data is abnormally low   REVIEW OF SYSTEMS:  As per history of present illness.   PAST MEDICAL HISTORY: Past Medical History:  Diagnosis Date   Anemia    Anxiety    Colon polyp    Depression    GERD (gastroesophageal reflux disease)     Headache    migraines    History of hysterectomy    still has both ovaries and cervix   History of migraine headaches    History of ovarian cyst    Hypertension    PONV (postoperative nausea and vomiting)     PAST SURGICAL HISTORY: Past Surgical History:  Procedure Laterality Date   COLONOSCOPY     knee athroscopy  08/2010   LT   LAPAROSCOPIC SUPRACERVICAL HYSTERECTOMY  08/2009   Supra cervical, still with ovaries and cervix, took out uterus   PARTIAL KNEE ARTHROPLASTY Left 02/10/2014   Procedure: LEFT MEDIAL UNICOMPARTMENTAL KNEE ARTHROPLASTY;  Surgeon: Dempsey Melodi GAILS, MD;  Location: [facility];  Service: Orthopedics;  Laterality: Left;   UPPER GI ENDOSCOPY     WISDOM TOOTH EXTRACTION  1995    ALLERGIES: Allergies  Allergen Reactions   Citalopram  Other (See Comments)   Ibuprofen  Swelling    Bilateral swelling of ankles and feet   Diflucan [Fluconazole] Other (See Comments)    Pelvic pain   Onion Other (See Comments)    migraines   Topamax [Topiramate] Other (See Comments)    Reaction:  Body aches     Omeprazole Other (See Comments)    Abdominal pain    Hydrocodone Anxiety, Palpitations and Other (See Comments)    Reaction:  Migraines     FAMILY HISTORY:  Family History  Problem Relation Age of Onset   Stroke Father  Heart attack Father    Hyperlipidemia Mother    Hypertension Mother    Breast cancer Maternal Aunt 87   Colon cancer Neg Hx    Colon polyps Neg Hx    Kidney disease Neg Hx    Gallbladder disease Neg Hx    Esophageal cancer Neg Hx    Alcohol abuse Neg Hx    Drug abuse Neg Hx    Depression Neg Hx    Bipolar disorder Neg Hx    Anxiety disorder Neg Hx    Schizophrenia Neg Hx    Suicidality Neg Hx     SOCIAL HISTORY: Social History   Socioeconomic History   Marital status: Single    Spouse name: Not on file   Number of children: 1   Years of education: Not on file   Highest education level: Some college, no degree  Occupational History    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[employer]  Tobacco Use   Smoking status: Never   Smokeless tobacco: Never  Vaping Use   Vaping status: Never Used  Substance and Sexual Activity   Alcohol use: Yes    Comment: occassional- once a month has about 2-3 drinks   Drug use: No   Sexual activity: Not Currently  Other Topics Concern   Not on file  Social History Narrative   Not on file   Social Drivers of Health   Financial Resource Strain: Low Risk  (05/21/2023)   Overall Financial Resource Strain (CARDIA)    Difficulty of Paying Living Expenses: Not very hard  Food Insecurity: Low Risk  (06/14/2023)   Received from [facility]   Hunger Vital Sign    Within the past 12 months, you worried that your food would run out before you got money to buy more: Never true    Within the past 12 months, the food you bought just didn't last and you didn't have money to get more. : Never true  Recent Concern: Food Insecurity - Food Insecurity Present (05/21/2023)   Hunger Vital Sign    Worried About Running Out of Food in the Last Year: Sometimes true    Ran Out of Food in the Last Year: Sometimes true  Transportation Needs: No Transportation Needs (06/14/2023)   Received from [facility]    In the past 12 months, has lack of reliable transportation kept you from medical appointments, meetings, work or from getting things needed for daily living? : No  Recent Concern: Transportation Needs - Unmet Transportation Needs (05/21/2023)   PRAPARE - Administrator, Civil Service (Medical): Yes    Lack of Transportation (Non-Medical): No  Physical Activity: Unknown (05/21/2023)   Exercise Vital Sign    Days of Exercise per Week: 0 days    Minutes of Exercise per Session: Not on file  Stress: Stress Concern Present (05/21/2023)   [employer] of Occupational Health - Occupational Stress Questionnaire    Feeling of Stress : To some extent  Social Connections: Moderately Isolated  (05/21/2023)   Social Connection and Isolation Panel    Frequency of Communication with Friends and Family: Never    Frequency of Social Gatherings with Friends and Family: Once a week    Attends Religious Services: More than 4 times per year    Active Member of [employer] or Organizations: Yes    Attends Engineer, Structural: More than 4 times per year    Marital Status: Divorced    MEDICATIONS:  Current Outpatient Medications  Medication Sig Dispense Refill   ALPRAZolam  (XANAX ) 0.25 MG tablet TAKE 1 TABLET(0.25 MG) BY MOUTH DAILY AS NEEDED FOR ANXIETY OR SLEEP 30 tablet 2   amLODipine  (NORVASC ) 10 MG tablet TAKE 1 TABLET(10 MG) BY MOUTH DAILY 90 tablet 1   ARIPiprazole  (ABILIFY ) 2 MG tablet TAKE 1 TABLET(2 MG) BY MOUTH DAILY 90 tablet 0   atorvastatin  (LIPITOR) 10 MG tablet Take 1 tablet (10 mg total) by mouth daily. 90 tablet 0   busPIRone  (BUSPAR ) 10 MG tablet Take 1 tablet (10 mg total) by mouth 3 (three) times daily. 270 tablet 1   gabapentin  (NEURONTIN ) 300 MG capsule Take 1 capsule (300 mg total) by mouth 2 (two) times daily. 60 capsule 2   SUMAtriptan  (IMITREX ) 50 MG tablet TAKE 1 TABLET MAY REPEAT IN 2 HOURS IF HEADACHE PERSISTS OR RECURS. DON'T USE MORE THAN TWO IN 24HRS 10 tablet 2   tirzepatide  (MOUNJARO ) 10 MG/0.5ML Pen Inject 10 mg into the skin once a week. 6 mL 1   traMADol  (ULTRAM ) 50 MG tablet TAKE 1 TABLET(50 MG) BY MOUTH EVERY 4 HOURS FOR UP TO 5 DAYS AS NEEDED FOR COUGH OR PAIN 30 tablet 0   venlafaxine  XR (EFFEXOR  XR) 75 MG 24 hr capsule Take 1 capsule (75 mg total) by mouth daily with breakfast. 90 capsule 1   venlafaxine  XR (EFFEXOR -XR) 150 MG 24 hr capsule TAKE 1 CAPSULE(150 MG) BY MOUTH DAILY WITH BREAKFAST 30 capsule 0   No current facility-administered medications for this visit.    PHYSICAL EXAM: Vitals:   02/21/24 1434  BP: 98/70  Pulse: 90  Resp: 20  SpO2: 97%  Weight: 232 lb (105.2 kg)  Height: 5' 4.5 (1.638 m)   Body mass index is 39.21 kg/m.   Wt Readings from Last 3 Encounters:  02/21/24 232 lb (105.2 kg)  02/21/24 233 lb 6.4 oz (105.9 kg)  08/29/23 248 lb 3.2 oz (112.6 kg)    General: Well developed, well nourished female in no apparent distress.  HEENT: AT/[place], no external lesions. Hearing intact to the spoken word Eyes: EOMI. No stare, proptosis or lid lag. Conjunctiva clear and no icterus.  Visual field grossly intact by confrontation. Neck: Trachea midline, neck supple without appreciable thyromegaly or lymphadenopathy and no palpable thyroid  nodules Lungs: Clear to auscultation, no wheeze. Respirations not labored Heart: S1S2, Regular in rate and rhythm.  Abdomen: Soft, non tender Neurologic: Alert, oriented, normal speech, deep tendon biceps reflexes normal 2+,  no gross focal neurological deficit Extremities: No pedal pitting edema, no tremors of outstretched hands Skin: Warm, color good.  Psychiatric: Does not appear depressed or anxious  PERTINENT HISTORIC LABORATORY AND IMAGING STUDIES:  All pertinent laboratory results were reviewed. Please see HPI also for further details.   TSH  Date Value Ref Range Status  08/29/2023 2.530 0.450 - 4.500 uIU/mL Final  02/17/2023 1.960 0.450 - 4.500 uIU/mL Final  05/23/2022 2.110 0.450 - 4.500 uIU/mL Final     ASSESSMENT / PLAN  1. Abnormal thyroid  blood test   2. Low serum thyroxine (T4) level    Patient had mildly low free T4 twice in the past, had persistently normal TSH mostly in the mid normal range.  She has no obvious hypothyroid symptoms/clinical features.  She is clinically euthyroid in the clinic today.  No known pressure disorder or pituitary surgery.  Unclear etiology for mildly low free T4 with normal TSH and free T3.  Technically this result can represent possible central hypothyroidism, however she has  no obvious pituitary disorder.  Low free T4 can also be related to laboratory recent interference.  Plan: - I would like to check free T4 with  dialysis method.  This is more accurate method to check free T4. - Recheck thyroid  function test including TSH, free T4, free T3 and also total T4. - I would like to check some of the pituitary gland related hormones including prolactin and cortisol. - Rest of the plan based on test results.  If she continues to have low free T4 with normal TSH even on dialysis method will consider MRI pituitary to look for pituitary gland.  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  Diagnoses and all orders for this visit:  Abnormal thyroid  blood test -     T3, free -     TSH -     T4, free -     T4 -     Free T4 by Dialysis/Mass Spec -     Cortisol -     Prolactin -     Prolactin -     Cortisol  Low serum thyroxine (T4) level -     T3, free -     TSH -     T4, free -     T4 -     Free T4 by Dialysis/Mass Spec -     Cortisol -     Prolactin -     Prolactin -     Cortisol    DISPOSITION Follow up in clinic in determine based on above plan.  All questions answered and patient verbalized understanding of the plan.  Sister Carbone, MD [facility] [address] [place], [address] [place], [place] [address] Phone # [phone]  At least part of this note was generated using voice recognition software. Inadvertent word errors may have occurred, which were not recognized during the proofreading process.

## 2024-02-22 ENCOUNTER — Encounter: Payer: Self-pay | Admitting: Endocrinology

## 2024-02-22 ENCOUNTER — Ambulatory Visit: Payer: Self-pay | Admitting: Family

## 2024-02-22 LAB — LIPID PANEL
Chol/HDL Ratio: 3.2 ratio (ref 0.0–4.4)
Cholesterol, Total: 155 mg/dL (ref 100–199)
HDL: 49 mg/dL (ref >39)
LDL Chol Calc (NIH): 83 mg/dL (ref 0–99)
Triglycerides: 130 mg/dL (ref 0–149)
VLDL Cholesterol Cal: 23 mg/dL (ref 5–40)

## 2024-02-22 LAB — VITAMIN D 25 HYDROXY (VIT D DEFICIENCY, FRACTURES): Vit D, 25-Hydroxy: 34.7 ng/mL (ref 30.0–100.0)

## 2024-02-22 LAB — COMPREHENSIVE METABOLIC PANEL WITH GFR
ALT: 34 IU/L — ABNORMAL HIGH (ref 0–32)
AST: 26 IU/L (ref 0–40)
Albumin: 4 g/dL (ref 3.8–4.9)
Alkaline Phosphatase: 116 IU/L (ref 49–135)
BUN/Creatinine Ratio: 16 (ref 9–23)
BUN: 13 mg/dL (ref 6–24)
Bilirubin Total: 0.3 mg/dL (ref 0.0–1.2)
CO2: 20 mmol/L (ref 20–29)
Calcium: 9.1 mg/dL (ref 8.7–10.2)
Chloride: 102 mmol/L (ref 96–106)
Creatinine, Ser: 0.83 mg/dL (ref 0.57–1.00)
Globulin, Total: 2.9 g/dL (ref 1.5–4.5)
Glucose: 98 mg/dL (ref 70–99)
Potassium: 4.4 mmol/L (ref 3.5–5.2)
Sodium: 139 mmol/L (ref 134–144)
Total Protein: 6.9 g/dL (ref 6.0–8.5)
eGFR: 85 mL/min/1.73 (ref >59)

## 2024-02-22 LAB — MICROALBUMIN / CREATININE URINE RATIO
Creatinine, Urine: 220.3 mg/dL
Microalb/Creat Ratio: 6 mg/g{creat} (ref 0–29)
Microalbumin, Urine: 13.1 ug/mL

## 2024-02-22 LAB — HEMOGLOBIN A1C
Est. average glucose Bld gHb Est-mCnc: 123 mg/dL
Hgb A1c MFr Bld: 5.9 % — ABNORMAL HIGH (ref 4.8–5.6)

## 2024-03-05 ENCOUNTER — Other Ambulatory Visit: Payer: Self-pay

## 2024-03-05 ENCOUNTER — Telehealth: Payer: Self-pay

## 2024-03-05 DIAGNOSIS — Z860100 Personal history of colon polyps, unspecified: Secondary | ICD-10-CM

## 2024-03-05 MED ORDER — NA SULFATE-K SULFATE-MG SULF 17.5-3.13-1.6 GM/177ML PO SOLN: 354.0000 mL | Freq: Once | ORAL | 0 refills | Status: AC

## 2024-03-05 NOTE — Telephone Encounter (Signed)
 Gastroenterology Pre-Procedure Review  Request Date: 05/17/2024 Requesting Physician: Dr. Melany  PATIENT REVIEW QUESTIONS: The patient responded to the following health history questions as indicated:    1. Are you having any GI issues? no 2. Do you have a personal history of Polyps? yes (7 years ago and had polyps) 3. Do you have a family history of Colon Cancer or Polyps? no 4. Diabetes Mellitus? yes (Mounjaro  hold for 7 days) 5. Joint replacements in the past 12 months?no 6. Major health problems in the past 3 months?no 7. Any artificial heart valves, MVP, or defibrillator?no    MEDICATIONS & ALLERGIES:    Patient reports the following regarding taking any anticoagulation/antiplatelet therapy:   Plavix, Coumadin, Eliquis, Xarelto , Lovenox , Pradaxa, Brilinta, or Effient? no Aspirin ? no  Patient confirms/reports the following medications:  Current Outpatient Medications  Medication Sig Dispense Refill   ALPRAZolam  (XANAX ) 0.25 MG tablet TAKE 1 TABLET(0.25 MG) BY MOUTH DAILY AS NEEDED FOR ANXIETY OR SLEEP 30 tablet 2   amLODipine  (NORVASC ) 10 MG tablet TAKE 1 TABLET(10 MG) BY MOUTH DAILY 90 tablet 1   ARIPiprazole  (ABILIFY ) 2 MG tablet TAKE 1 TABLET(2 MG) BY MOUTH DAILY 90 tablet 0   atorvastatin  (LIPITOR) 10 MG tablet Take 1 tablet (10 mg total) by mouth daily. 90 tablet 0   busPIRone  (BUSPAR ) 10 MG tablet Take 1 tablet (10 mg total) by mouth 3 (three) times daily. 270 tablet 1   gabapentin  (NEURONTIN ) 300 MG capsule Take 1 capsule (300 mg total) by mouth 2 (two) times daily. 60 capsule 2   SUMAtriptan  (IMITREX ) 50 MG tablet TAKE 1 TABLET MAY REPEAT IN 2 HOURS IF HEADACHE PERSISTS OR RECURS. DON'T USE MORE THAN TWO IN 24HRS 10 tablet 2   tirzepatide  (MOUNJARO ) 10 MG/0.5ML Pen Inject 10 mg into the skin once a week. 6 mL 1   traMADol  (ULTRAM ) 50 MG tablet TAKE 1 TABLET(50 MG) BY MOUTH EVERY 4 HOURS FOR UP TO 5 DAYS AS NEEDED FOR COUGH OR PAIN 30 tablet 0   venlafaxine  XR (EFFEXOR  XR)  75 MG 24 hr capsule Take 1 capsule (75 mg total) by mouth daily with breakfast. 90 capsule 1   venlafaxine  XR (EFFEXOR -XR) 150 MG 24 hr capsule TAKE 1 CAPSULE(150 MG) BY MOUTH DAILY WITH BREAKFAST 30 capsule 0   No current facility-administered medications for this visit.    Patient confirms/reports the following allergies:  Allergies  Allergen Reactions   Citalopram  Other (See Comments)   Ibuprofen  Swelling    Bilateral swelling of ankles and feet   Diflucan [Fluconazole] Other (See Comments)    Pelvic pain   Onion Other (See Comments)    migraines   Topamax [Topiramate] Other (See Comments)    Reaction:  Body aches     Omeprazole Other (See Comments)    Abdominal pain    Hydrocodone Anxiety, Palpitations and Other (See Comments)    Reaction:  Migraines     No orders of the defined types were placed in this encounter.   AUTHORIZATION INFORMATION Primary Insurance: 1D#: Group #:  Secondary Insurance: 1D#: Group #:  SCHEDULE INFORMATION: Date: 05/17/2024 Time: Location: [facility] Dr. Melany

## 2024-03-11 ENCOUNTER — Telehealth: Admitting: Adult Health

## 2024-03-11 ENCOUNTER — Encounter: Payer: Self-pay | Admitting: Adult Health

## 2024-03-11 DIAGNOSIS — F411 Generalized anxiety disorder: Secondary | ICD-10-CM

## 2024-03-11 DIAGNOSIS — F331 Major depressive disorder, recurrent, moderate: Secondary | ICD-10-CM

## 2024-03-11 DIAGNOSIS — F4000 Agoraphobia, unspecified: Secondary | ICD-10-CM

## 2024-03-11 DIAGNOSIS — F410 Panic disorder [episodic paroxysmal anxiety] without agoraphobia: Secondary | ICD-10-CM

## 2024-03-11 DIAGNOSIS — F4001 Agoraphobia with panic disorder: Secondary | ICD-10-CM

## 2024-03-11 DIAGNOSIS — G4700 Insomnia, unspecified: Secondary | ICD-10-CM | POA: Diagnosis not present

## 2024-03-11 MED ORDER — ALPRAZOLAM 0.25 MG PO TABS: ORAL_TABLET | ORAL | 2 refills | Status: AC

## 2024-03-11 MED ORDER — VENLAFAXINE HCL ER 75 MG PO CP24: 75.0000 mg | ORAL_CAPSULE | Freq: Every day | ORAL | 1 refills | Status: AC

## 2024-03-11 MED ORDER — BUSPIRONE HCL 10 MG PO TABS: 10.0000 mg | ORAL_TABLET | Freq: Three times a day (TID) | ORAL | 1 refills | Status: AC

## 2024-03-11 MED ORDER — VENLAFAXINE HCL ER 150 MG PO CP24: 150.0000 mg | ORAL_CAPSULE | Freq: Every morning | ORAL | 1 refills | Status: AC

## 2024-03-11 MED ORDER — ARIPIPRAZOLE 2 MG PO TABS
2.0000 mg | ORAL_TABLET | Freq: Every day | ORAL | 1 refills | Status: AC
Start: 2024-03-11 — End: ?

## 2024-03-11 NOTE — Progress Notes (Signed)
 Patricia Bauer [id number] 1970-12-06 53 y.o.  Virtual Visit via Video Note  I connected with pt @ on 03/11/24 at  1:30 PM EST by a video enabled telemedicine application and verified that I am speaking with the correct person using two identifiers.   I discussed the limitations of evaluation and management by telemedicine and the availability of in person appointments. The patient expressed understanding and agreed to proceed.  I discussed the assessment and treatment plan with the patient. The patient was provided an opportunity to ask questions and all were answered. The patient agreed with the plan and demonstrated an understanding of the instructions.   The patient was advised to call back or seek an in-person evaluation if the symptoms worsen or if the condition fails to improve as anticipated.  I provided 25 minutes of non-face-to-face time during this encounter.  The patient was located at home.  The provider was located at [facility].   Patricia LOISE Sayers, NP   Subjective:   Patient ID:  Patricia Bauer is a 53 y.o. (DOB Nov 27, 1970) female.  Chief Complaint: No chief complaint on file.   HPI Patricia Bauer presents for follow-up of MDD, GAD, insomnia, panic attacks, and agoraphobia.   Describes mood today as ok. Pleasant. Denies tearfulness. Mood symptoms - denies depression - not lately. Reports varying interest and motivation. Reports decreased anxiety -  a lot better. Reports recent panic attacks - one work related - other from a nightmare. Denies irritability. Reports decreased worry and rumination - that has gone down. Reports over thinking - staying about the same. Reports decreased obsessive thoughts and acts - reports decreased skin picking - not as much. Reporting chronic pain issues - gotten worse - fibromyalgia. Stating I feel like I'm doing ok. Taking medications as prescribed.  Energy levels lower. Active, does not have a regular  exercise routine with physical disabilities. Enjoys some usual interests and activities. Single. Divorced. Lives alone with dog. Her daughter and grandchildren have moved out. Mother in memory care unit. Spending time with family and friends. Appetite adequate. Weight loss - 12 to 14 pounds with Monjauro. Reports sleeping better. Averages 8 to 10 hours with recent illness.  Diagnosed with mild sleep apnea - unable to afford CPAP machine. Focus and concentration getting a little better. Completing tasks. Managing aspects of household. Works full time for [employer] - remote - day shift. Denies SI or HI.  Denies AH or VH. Denies self harm - decreased skin picking - not as much. Denies substance use.  Therapist - Donzell Rend - x 2 years.  Previous medication trials:  Celexa , Zoloft - flat, Wellbutrin   Review of Systems:  Review of Systems  Musculoskeletal:  Negative for gait problem.  Neurological:  Negative for tremors.  Psychiatric/Behavioral:         Please refer to HPI    Medications: I have reviewed the patient's current medications.  Current Outpatient Medications  Medication Sig Dispense Refill   ALPRAZolam  (XANAX ) 0.25 MG tablet TAKE 1 TABLET(0.25 MG) BY MOUTH DAILY AS NEEDED FOR ANXIETY OR SLEEP 30 tablet 2   amLODipine  (NORVASC ) 10 MG tablet TAKE 1 TABLET(10 MG) BY MOUTH DAILY 90 tablet 1   ARIPiprazole  (ABILIFY ) 2 MG tablet TAKE 1 TABLET(2 MG) BY MOUTH DAILY 90 tablet 0   atorvastatin  (LIPITOR) 10 MG tablet Take 1 tablet (10 mg total) by mouth daily. 90 tablet 0   busPIRone  (BUSPAR ) 10 MG tablet Take 1 tablet (10 mg  total) by mouth 3 (three) times daily. 270 tablet 1   gabapentin  (NEURONTIN ) 300 MG capsule Take 1 capsule (300 mg total) by mouth 2 (two) times daily. 60 capsule 2   SUMAtriptan  (IMITREX ) 50 MG tablet TAKE 1 TABLET MAY REPEAT IN 2 HOURS IF HEADACHE PERSISTS OR RECURS. DON'T USE MORE THAN TWO IN 24HRS 10 tablet 2   tirzepatide  (MOUNJARO ) 10 MG/0.5ML Pen Inject  10 mg into the skin once a week. 6 mL 1   traMADol  (ULTRAM ) 50 MG tablet TAKE 1 TABLET(50 MG) BY MOUTH EVERY 4 HOURS FOR UP TO 5 DAYS AS NEEDED FOR COUGH OR PAIN 30 tablet 0   venlafaxine  XR (EFFEXOR  XR) 75 MG 24 hr capsule Take 1 capsule (75 mg total) by mouth daily with breakfast. 90 capsule 1   venlafaxine  XR (EFFEXOR -XR) 150 MG 24 hr capsule TAKE 1 CAPSULE(150 MG) BY MOUTH DAILY WITH BREAKFAST 30 capsule 0   No current facility-administered medications for this visit.    Medication Side Effects: None  Allergies:  Allergies  Allergen Reactions   Citalopram  Other (See Comments)   Ibuprofen  Swelling    Bilateral swelling of ankles and feet   Diflucan [Fluconazole] Other (See Comments)    Pelvic pain   Onion Other (See Comments)    migraines   Topamax [Topiramate] Other (See Comments)    Reaction:  Body aches     Omeprazole Other (See Comments)    Abdominal pain    Hydrocodone Anxiety, Palpitations and Other (See Comments)    Reaction:  Migraines     Past Medical History:  Diagnosis Date   Anemia    Anxiety    Colon polyp    Depression    GERD (gastroesophageal reflux disease)    Headache    migraines    History of hysterectomy    still has both ovaries and cervix   History of migraine headaches    History of ovarian cyst    Hypertension    PONV (postoperative nausea and vomiting)     Family History  Problem Relation Age of Onset   Stroke Father    Heart attack Father    Hyperlipidemia Mother    Hypertension Mother    Breast cancer Maternal Aunt 22   Colon cancer Neg Hx    Colon polyps Neg Hx    Kidney disease Neg Hx    Gallbladder disease Neg Hx    Esophageal cancer Neg Hx    Alcohol abuse Neg Hx    Drug abuse Neg Hx    Depression Neg Hx    Bipolar disorder Neg Hx    Anxiety disorder Neg Hx    Schizophrenia Neg Hx    Suicidality Neg Hx     Social History   Socioeconomic History   Marital status: Single    Spouse name: Not on file   Number of  children: 1   Years of education: Not on file   Highest education level: Some college, no degree  Occupational History   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[employer]  Tobacco Use   Smoking status: Never   Smokeless tobacco: Never  Vaping Use   Vaping status: Never Used  Substance and Sexual Activity   Alcohol use: Yes    Comment: occassional- once a month has about 2-3 drinks   Drug use: No   Sexual activity: Not Currently  Other Topics Concern   Not on file  Social History Narrative   Not on file   Social Drivers of  Health   Financial Resource Strain: Low Risk  (05/21/2023)   Overall Financial Resource Strain (CARDIA)    Difficulty of Paying Living Expenses: Not very hard  Food Insecurity: Low Risk  (06/14/2023)   Received from [facility]   Hunger Vital Sign    Within the past 12 months, you worried that your food would run out before you got money to buy more: Never true    Within the past 12 months, the food you bought just didn't last and you didn't have money to get more. : Never true  Recent Concern: Food Insecurity - Food Insecurity Present (05/21/2023)   Hunger Vital Sign    Worried About Running Out of Food in the Last Year: Sometimes true    Ran Out of Food in the Last Year: Sometimes true  Transportation Needs: No Transportation Needs (06/14/2023)   Received from [facility]    In the past 12 months, has lack of reliable transportation kept you from medical appointments, meetings, work or from getting things needed for daily living? : No  Recent Concern: Transportation Needs - Unmet Transportation Needs (05/21/2023)   PRAPARE - Administrator, Civil Service (Medical): Yes    Lack of Transportation (Non-Medical): No  Physical Activity: Unknown (05/21/2023)   Exercise Vital Sign    Days of Exercise per Week: 0 days    Minutes of Exercise per Session: Not on file  Stress: Stress Concern Present (05/21/2023)   [employer] of Occupational  Health - Occupational Stress Questionnaire    Feeling of Stress : To some extent  Social Connections: Moderately Isolated (05/21/2023)   Social Connection and Isolation Panel    Frequency of Communication with Friends and Family: Never    Frequency of Social Gatherings with Friends and Family: Once a week    Attends Religious Services: More than 4 times per year    Active Member of [employer] or Organizations: Yes    Attends Engineer, Structural: More than 4 times per year    Marital Status: Divorced  Catering Manager Violence: Not on file    Past Medical History, Surgical history, Social history, and Family history were reviewed and updated as appropriate.   Please see review of systems for further details on the patient's review from today.   Objective:   Physical Exam:  LMP 09/07/2009   Physical Exam Constitutional:      General: She is not in acute distress. Musculoskeletal:        General: No deformity.  Neurological:     Mental Status: She is alert and oriented to person, place, and time.     Coordination: Coordination normal.  Psychiatric:        Attention and Perception: Attention and perception normal. She does not perceive auditory or visual hallucinations.        Mood and Affect: Mood normal. Mood is not anxious or depressed. Affect is not labile, blunt, angry or inappropriate.        Speech: Speech normal.        Behavior: Behavior normal.        Thought Content: Thought content normal. Thought content is not paranoid or delusional. Thought content does not include homicidal or suicidal ideation. Thought content does not include homicidal or suicidal plan.        Cognition and Memory: Cognition and memory normal.        Judgment: Judgment normal.     Comments: Insight intact  Lab Review:     Component Value Date/Time   NA 139 02/21/2024 0856   NA 139 03/13/2013 2022   K 4.4 02/21/2024 0856   K 3.6 03/13/2013 2022   CL 102 02/21/2024 0856   CL 106  03/13/2013 2022   CO2 20 02/21/2024 0856   CO2 26 03/13/2013 2022   GLUCOSE 98 02/21/2024 0856   GLUCOSE 110 (H) 05/04/2020 0503   GLUCOSE 94 03/13/2013 2022   BUN 13 02/21/2024 0856   BUN 11 03/13/2013 2022   CREATININE 0.83 02/21/2024 0856   CREATININE 1.05 (H) 05/24/2019 1359   CREATININE 0.80 03/13/2013 2022   CALCIUM  9.1 02/21/2024 0856   CALCIUM  8.7 03/13/2013 2022   PROT 6.9 02/21/2024 0856   ALBUMIN 4.0 02/21/2024 0856   AST 26 02/21/2024 0856   AST 27 05/24/2019 1359   ALT 34 (H) 02/21/2024 0856   ALT 32 05/24/2019 1359   ALKPHOS 116 02/21/2024 0856   BILITOT 0.3 02/21/2024 0856   BILITOT 0.3 05/24/2019 1359   GFRNONAA >60 05/04/2020 0503   GFRNONAA >60 05/24/2019 1359   GFRNONAA >60 03/13/2013 2022   GFRAA 89 12/31/2019 1451   GFRAA >60 05/24/2019 1359   GFRAA >60 03/13/2013 2022       Component Value Date/Time   WBC 9.9 06/16/2023 1253   WBC 8.1 05/14/2021 1429   RBC 4.51 06/16/2023 1254   RBC 4.49 06/16/2023 1253   HGB 14.0 06/16/2023 1253   HGB 11.9 01/17/2019 1519   HGB 12.0 01/17/2019 1519   HCT 41.7 06/16/2023 1253   HCT 36.1 01/17/2019 1519   HCT 36.5 01/17/2019 1519   PLT 309 06/16/2023 1253   PLT 333 01/17/2019 1519   PLT 338 01/17/2019 1519   MCV 92.9 06/16/2023 1253   MCV 82 01/17/2019 1519   MCV 83 01/17/2019 1519   MCV 85 03/13/2013 2022   MCH 31.2 06/16/2023 1253   MCHC 33.6 06/16/2023 1253   RDW 12.5 06/16/2023 1253   RDW 14.1 01/17/2019 1519   RDW 14.2 01/17/2019 1519   RDW 13.4 03/13/2013 2022   LYMPHSABS 3.4 06/16/2023 1253   LYMPHSABS 2.8 01/17/2019 1519   LYMPHSABS 3.0 01/17/2019 1519   MONOABS 0.5 06/16/2023 1253   EOSABS 0.1 06/16/2023 1253   EOSABS 0.2 01/17/2019 1519   EOSABS 0.2 01/17/2019 1519   BASOSABS 0.1 06/16/2023 1253   BASOSABS 0.0 01/17/2019 1519   BASOSABS 0.0 01/17/2019 1519    No results found for: POCLITH, LITHIUM   No results found for: PHENYTOIN, PHENOBARB, VALPROATE, CBMZ    .res Assessment: Plan:    Plan:  PDMP reviewed  Abilify  2mg  daily Effexor  XR 150 and 75mg  every morning Xanax  0.25mg  as needed - takes once a week Buspar  10mg  TID  Plans to reach out to [facility] - TMS information given.  Continues to see therapist - Donzell Rend.  RTC 3 months  25 minutes spent dedicated to the care of this patient on the date of this encounter to include pre-visit review of records, ordering of medication, post visit documentation, and face-to-face time with the patient discussing MDD, GAD, insomnia, panic attacks, and agoraphobia. Discussed continuing current medication regimen.  Patient advised to contact office with any questions, adverse effects, or acute worsening in signs and symptoms.  Discussed potential benefits, risk, and side effects of benzodiazepines to include potential risk of tolerance and dependence, as well as possible drowsiness. Advised patient not to drive if experiencing drowsiness and to take lowest possible effective  dose to minimize risk of dependence and tolerance.   There are no diagnoses linked to this encounter.   Please see After Visit Summary for patient specific instructions.  Future Appointments  Date Time Provider Department Center  03/11/2024  1:30 PM Tevin Shillingford, Patricia Mattocks, NP [facility] None  03/12/2024  9:00 AM Corwin Antu, FNP [facility] [id number]  06/14/2024 12:45 PM [facility] None  06/14/2024  1:00 PM Franchot Lauraine HERO, NP [facility] None    No orders of the defined types were placed in this encounter.     -------------------------------

## 2024-03-12 ENCOUNTER — Ambulatory Visit: Admitting: Family

## 2024-03-12 ENCOUNTER — Ambulatory Visit: Admitting: Family Medicine

## 2024-03-12 VITALS — BP 130/78 | HR 96 | Temp 98.4°F | Ht 64.5 in | Wt 230.4 lb

## 2024-03-12 DIAGNOSIS — J020 Streptococcal pharyngitis: Secondary | ICD-10-CM | POA: Insufficient documentation

## 2024-03-12 DIAGNOSIS — J029 Acute pharyngitis, unspecified: Secondary | ICD-10-CM

## 2024-03-12 LAB — POCT RAPID STREP A (OFFICE): Rapid Strep A Screen: NEGATIVE

## 2024-03-12 MED ORDER — AMOXICILLIN 500 MG PO CAPS: 500.0000 mg | ORAL_CAPSULE | Freq: Two times a day (BID) | ORAL | 0 refills | Status: AC

## 2024-03-12 NOTE — Assessment & Plan Note (Signed)
 RST negative However with oropharyngeal erythema, R tonsillar LAD, and recent strep exposure do recommend covering for streptococcal pharyngitis.  Rx amox 500mg  BID x10d course.  Supportive measures as per instructions. Red flags to seek further care reviewed.  Update if not improving with treatment.

## 2024-03-12 NOTE — Progress Notes (Signed)
 Ph: [phone] Fax: [phone]   [name], female    DOB: October 05, 1970, 53 y.o.   MRN: [id number]  This visit was conducted in person.  BP 130/78   Pulse 96   Temp 98.4 F (36.9 C) (Oral)   Ht 5' 4.5 (1.638 m)   Wt 230 lb 6 oz (104.5 kg)   LMP 09/07/2009   SpO2 98%   BMI 38.93 kg/m    CC: sore throat  Subjective:   HPI: Patricia Bauer is a 53 y.o. female presenting on 03/12/2024 for Sore Throat (Pt here for sore throat. Started 03/04/24. )   Watched granddaughter  about 10d ago. Granddaughter subsequently developed strep throat.   Pt started having sore throat 8-9 days ago, HA, mild cough due to throat irritation.   She's treated with ES tylenol  for HA as well as salt water  gargles, recently started Zycam.   No fever/chills, ear or tooth pain, abd pain, nausea, diarrhea, body aches.  H/o fibromyalgia.   No h/o asthma Non smoker      Relevant past medical, surgical, family and social history reviewed and updated as indicated. Interim medical history since our last visit reviewed. Allergies and medications reviewed and updated. Outpatient Medications Prior to Visit  Medication Sig Dispense Refill   ALPRAZolam  (XANAX ) 0.25 MG tablet TAKE 1 TABLET(0.25 MG) BY MOUTH DAILY AS NEEDED FOR ANXIETY OR SLEEP 30 tablet 2   amLODipine  (NORVASC ) 10 MG tablet TAKE 1 TABLET(10 MG) BY MOUTH DAILY 90 tablet 1   ARIPiprazole  (ABILIFY ) 2 MG tablet Take 1 tablet (2 mg total) by mouth daily. 90 tablet 1   atorvastatin  (LIPITOR) 10 MG tablet Take 1 tablet (10 mg total) by mouth daily. 90 tablet 0   busPIRone  (BUSPAR ) 10 MG tablet Take 1 tablet (10 mg total) by mouth 3 (three) times daily. 270 tablet 1   gabapentin  (NEURONTIN ) 300 MG capsule Take 1 capsule (300 mg total) by mouth 2 (two) times daily. 60 capsule 2   SUMAtriptan  (IMITREX ) 50 MG tablet TAKE 1 TABLET MAY REPEAT IN 2 HOURS IF HEADACHE PERSISTS OR RECURS. DON'T USE MORE THAN TWO IN 24HRS 10 tablet 2    tirzepatide  (MOUNJARO ) 10 MG/0.5ML Pen Inject 10 mg into the skin once a week. 6 mL 1   traMADol  (ULTRAM ) 50 MG tablet TAKE 1 TABLET(50 MG) BY MOUTH EVERY 4 HOURS FOR UP TO 5 DAYS AS NEEDED FOR COUGH OR PAIN 30 tablet 0   venlafaxine  XR (EFFEXOR  XR) 75 MG 24 hr capsule Take 1 capsule (75 mg total) by mouth daily with breakfast. 90 capsule 1   venlafaxine  XR (EFFEXOR -XR) 150 MG 24 hr capsule Take 1 capsule (150 mg total) by mouth every morning. 90 capsule 1   No facility-administered medications prior to visit.     Per HPI unless specifically indicated in ROS section below Review of Systems  Objective:  BP 130/78   Pulse 96   Temp 98.4 F (36.9 C) (Oral)   Ht 5' 4.5 (1.638 m)   Wt 230 lb 6 oz (104.5 kg)   LMP 09/07/2009   SpO2 98%   BMI 38.93 kg/m   Wt Readings from Last 3 Encounters:  03/12/24 230 lb 6 oz (104.5 kg)  02/21/24 232 lb (105.2 kg)  02/21/24 233 lb 6.4 oz (105.9 kg)      Physical Exam Vitals and nursing note reviewed.  Constitutional:      Appearance: Normal appearance. She is not ill-appearing.  HENT:     Head: Normocephalic and atraumatic.     Right Ear: Tympanic membrane, ear canal and external ear normal. There is no impacted cerumen.     Left Ear: Tympanic membrane, ear canal and external ear normal. There is no impacted cerumen.     Nose: Nose normal. No congestion or rhinorrhea.     Mouth/Throat:     Mouth: Mucous membranes are moist.     Pharynx: Oropharynx is clear. Posterior oropharyngeal erythema (R>L posterior oropharyngeal) present. No oropharyngeal exudate.     Tonsils: No tonsillar exudate.  Eyes:     Extraocular Movements: Extraocular movements intact.     Conjunctiva/sclera: Conjunctivae normal.     Pupils: Pupils are equal, round, and reactive to light.  Cardiovascular:     Rate and Rhythm: Normal rate and regular rhythm.     Pulses: Normal pulses.     Heart sounds: Normal heart sounds. No murmur heard. Pulmonary:     Effort:  Pulmonary effort is normal. No respiratory distress.     Breath sounds: Normal breath sounds. No wheezing, rhonchi or rales.  Lymphadenopathy:     Head:     Right side of head: Tonsillar adenopathy present. No submental, submandibular, preauricular or posterior auricular adenopathy.     Left side of head: No submental, submandibular, tonsillar, preauricular or posterior auricular adenopathy.     Cervical: No cervical adenopathy.     Right cervical: No superficial or deep cervical adenopathy.    Left cervical: No superficial or deep cervical adenopathy.     Upper Body:     Right upper body: No supraclavicular adenopathy.     Left upper body: No supraclavicular adenopathy.  Skin:    Findings: No rash.  Neurological:     Mental Status: She is alert.  Psychiatric:        Mood and Affect: Mood normal.        Behavior: Behavior normal.       Results for orders placed or performed in visit on 03/12/24  POCT rapid strep A   Collection Time: 03/12/24  2:33 PM  Result Value Ref Range   Rapid Strep A Screen Negative Negative    Assessment & Plan:   Problem List Items Addressed This Visit     Acute streptococcal pharyngitis - Primary   RST negative However with oropharyngeal erythema, R tonsillar LAD, and recent strep exposure do recommend covering for streptococcal pharyngitis.  Rx amox 500mg  BID x10d course.  Supportive measures as per instructions. Red flags to seek further care reviewed.  Update if not improving with treatment.      Other Visit Diagnoses       Sore throat       Relevant Orders   POCT rapid strep A (Completed)        Meds ordered this encounter  Medications   amoxicillin  (AMOXIL ) 500 MG capsule    Sig: Take 1 capsule (500 mg total) by mouth 2 (two) times daily for 10 days.    Dispense:  20 capsule    Refill:  0    Orders Placed This Encounter  Procedures   POCT rapid strep A    Patient Instructions  You do likely have acute streptococcal  pharyngitis (strep throat). Push fluids and plenty of rest. Take antibiotic sent to pharmacy amoxicillin .  May use tylenol  for throat pain. Salt water  gargles. Suck on cold things like popsicles or warm things like herbal teas (whichever soothes the throat better). Return  if fever >101.5, worsening pain, or trouble opening/closing mouth, or hoarse voice.  Good to see you today, call clinic with questions.   Follow up plan: No follow-ups on file.  Anton Blas, MD

## 2024-03-12 NOTE — Patient Instructions (Signed)
 You do likely have acute streptococcal pharyngitis (strep throat). Push fluids and plenty of rest. Take antibiotic sent to pharmacy amoxicillin .  May use tylenol  for throat pain. Salt water  gargles. Suck on cold things like popsicles or warm things like herbal teas (whichever soothes the throat better). Return if fever >101.5, worsening pain, or trouble opening/closing mouth, or hoarse voice.  Good to see you today, call clinic with questions.

## 2024-03-13 ENCOUNTER — Ambulatory Visit

## 2024-03-19 ENCOUNTER — Ambulatory Visit: Admitting: Family

## 2024-03-19 ENCOUNTER — Encounter: Payer: Self-pay | Admitting: Family

## 2024-03-20 ENCOUNTER — Ambulatory Visit: Payer: Self-pay | Admitting: Endocrinology

## 2024-03-23 LAB — T3, FREE: T3, Free: 2.8 pg/mL (ref 2.0–4.4)

## 2024-03-23 LAB — TSH: TSH: 1.9 u[IU]/mL (ref 0.450–4.500)

## 2024-03-23 LAB — PROLACTIN: Prolactin: 24.7 ng/mL (ref 3.6–25.2)

## 2024-03-23 LAB — T4, FREE: Free T4: 0.83 ng/dL (ref 0.82–1.77)

## 2024-03-23 LAB — T4: T4, Total: 6.5 ug/dL (ref 4.5–12.0)

## 2024-03-23 LAB — CORTISOL: Cortisol: 19.9 ug/dL — ABNORMAL HIGH (ref 6.2–19.4)

## 2024-03-23 LAB — FREE T4 BY DIALYSIS/MASS SPEC: Free T4 by Dialysis: 0.76 ng/dL — ABNORMAL LOW

## 2024-04-20 ENCOUNTER — Encounter: Payer: Self-pay | Admitting: Family

## 2024-05-07 ENCOUNTER — Telehealth: Payer: Self-pay

## 2024-05-07 MED ORDER — NA SULFATE-K SULFATE-MG SULF 17.5-3.13-1.6 GM/177ML PO SOLN: 354.0000 mL | Freq: Once | ORAL | 0 refills | Status: AC

## 2024-05-07 NOTE — Addendum Note (Signed)
 Addended by: JODIE HEADINGS on: 05/07/2024 04:26 PM   Modules accepted: Orders

## 2024-05-07 NOTE — Telephone Encounter (Signed)
 Called patient back and she stated that she would need to reschedule her procedure to another date. She requested 05/31/2024 at [facility]. I told her that we would reschedule her with Dr. Theophilus for 05/31/2024 at [facility] and that I would be sending her new instructions via MyChart since she was changing provider and location. Patient agreed and had no further questions. Luke will be notied from [facility] of the change.

## 2024-05-07 NOTE — Telephone Encounter (Signed)
 Per pt need to r/s procedure.

## 2024-05-07 NOTE — Addendum Note (Signed)
 Addended by: JODIE HEADINGS on: 05/07/2024 04:35 PM   Modules accepted: Orders

## 2024-05-08 ENCOUNTER — Ambulatory Visit: Admitting: Podiatry

## 2024-05-14 ENCOUNTER — Encounter: Payer: Self-pay | Admitting: Family

## 2024-05-14 DIAGNOSIS — I10 Essential (primary) hypertension: Secondary | ICD-10-CM

## 2024-05-14 MED ORDER — AMLODIPINE BESYLATE 10 MG PO TABS: ORAL_TABLET | ORAL | 1 refills | Status: AC

## 2024-05-17 ENCOUNTER — Ambulatory Visit: Admission: RE | Admit: 2024-05-17 | Source: Home / Self Care | Admitting: Gastroenterology

## 2024-05-17 ENCOUNTER — Encounter: Admission: RE | Payer: Self-pay | Source: Home / Self Care

## 2024-05-17 SURGERY — COLONOSCOPY
Anesthesia: General

## 2024-05-20 ENCOUNTER — Ambulatory Visit: Admitting: Podiatry

## 2024-05-21 ENCOUNTER — Encounter: Payer: Self-pay | Admitting: Family

## 2024-05-21 ENCOUNTER — Ambulatory Visit

## 2024-05-21 ENCOUNTER — Ambulatory Visit
Admission: EM | Admit: 2024-05-21 | Discharge: 2024-05-21 | Disposition: A | Discharge status: Home / Self Care | Source: Home / Self Care

## 2024-05-21 DIAGNOSIS — W19XXXA Unspecified fall, initial encounter: Secondary | ICD-10-CM

## 2024-05-21 DIAGNOSIS — M25562 Pain in left knee: Secondary | ICD-10-CM

## 2024-05-21 DIAGNOSIS — S60222A Contusion of left hand, initial encounter: Secondary | ICD-10-CM | POA: Diagnosis not present

## 2024-05-21 DIAGNOSIS — S8002XA Contusion of left knee, initial encounter: Secondary | ICD-10-CM

## 2024-05-21 DIAGNOSIS — M79642 Pain in left hand: Secondary | ICD-10-CM

## 2024-05-21 DIAGNOSIS — S0083XA Contusion of other part of head, initial encounter: Secondary | ICD-10-CM | POA: Diagnosis not present

## 2024-05-21 NOTE — ED Triage Notes (Signed)
 Pt c/o L sided facial,hand & knee pain d/t fall on ice today. States was walking her dog at home. Denies any LOC,blurred vision or photophobia. No OTC meds.

## 2024-05-21 NOTE — Discharge Instructions (Addendum)
 Your x-rays did not show evidence of broken bones.  I do suspect that you have bruised your face, hand, knee as a result of your recent fall.  You may apply ice to all areas of pain for 20 minutes at a time, 2-3 times a day, to help with pain and inflammation.  Make sure you have a cloth between the ice and your skin so as to not cause skin damage.  You may use over-the-counter Tylenol  and/or ibuprofen  as needed for mild-moderate pain and use your tramadol  that you have been previously prescribed as needed for severe pain.  If your pain becomes worse you may have the return for reevaluation or follow-up with orthopedics such as [facility] here in [place] or in [place].

## 2024-05-22 ENCOUNTER — Telehealth: Payer: Self-pay

## 2024-05-22 NOTE — Telephone Encounter (Signed)
 Called patient back and she needed to reschedule her procedure from 05/31/2024 to 06/28/2024 with Dr. Theophilus at [facility] will be notified.

## 2024-05-22 NOTE — Telephone Encounter (Signed)
 Per pt need to r/s procedure.

## 2024-05-29 ENCOUNTER — Ambulatory Visit: Admitting: Podiatry

## 2024-06-14 ENCOUNTER — Ambulatory Visit: Payer: 59 | Admitting: Family

## 2024-06-14 ENCOUNTER — Inpatient Hospital Stay: Payer: 59

## 2024-06-28 ENCOUNTER — Encounter: Admission: RE | Payer: Self-pay | Source: Home / Self Care

## 2024-06-28 ENCOUNTER — Ambulatory Visit: Admission: RE | Admit: 2024-06-28 | Source: Home / Self Care
